# Patient Record
Sex: Male | Born: 1960
Health system: Southern US, Community
[De-identification: ages and names within clinical notes are randomized; demographics above are authoritative.]

## PROBLEM LIST (undated history)

## (undated) DIAGNOSIS — G5731 Lesion of lateral popliteal nerve, right lower limb: Secondary | ICD-10-CM

## (undated) DIAGNOSIS — R351 Nocturia: Secondary | ICD-10-CM

## (undated) DIAGNOSIS — I493 Ventricular premature depolarization: Secondary | ICD-10-CM

## (undated) DIAGNOSIS — K219 Gastro-esophageal reflux disease without esophagitis: Secondary | ICD-10-CM

## (undated) DIAGNOSIS — G43909 Migraine, unspecified, not intractable, without status migrainosus: Secondary | ICD-10-CM

## (undated) DIAGNOSIS — M1990 Unspecified osteoarthritis, unspecified site: Secondary | ICD-10-CM

## (undated) DIAGNOSIS — M109 Gout, unspecified: Secondary | ICD-10-CM

## (undated) DIAGNOSIS — I712 Thoracic aortic aneurysm, without rupture: Secondary | ICD-10-CM

## (undated) DIAGNOSIS — M21371 Foot drop, right foot: Secondary | ICD-10-CM

## (undated) DIAGNOSIS — I10 Essential (primary) hypertension: Secondary | ICD-10-CM

## (undated) DIAGNOSIS — J309 Allergic rhinitis, unspecified: Secondary | ICD-10-CM

## (undated) DIAGNOSIS — I350 Nonrheumatic aortic (valve) stenosis: Secondary | ICD-10-CM

## (undated) DIAGNOSIS — L309 Dermatitis, unspecified: Secondary | ICD-10-CM

## (undated) DIAGNOSIS — G4762 Sleep related leg cramps: Secondary | ICD-10-CM

## (undated) DIAGNOSIS — I7121 Aneurysm of the ascending aorta, without rupture: Secondary | ICD-10-CM

## (undated) DIAGNOSIS — B001 Herpesviral vesicular dermatitis: Secondary | ICD-10-CM

## (undated) DIAGNOSIS — G610 Guillain-Barre syndrome: Secondary | ICD-10-CM

## (undated) DIAGNOSIS — Z9889 Other specified postprocedural states: Secondary | ICD-10-CM

## (undated) DIAGNOSIS — G2581 Restless legs syndrome: Secondary | ICD-10-CM

## (undated) DIAGNOSIS — R112 Nausea with vomiting, unspecified: Secondary | ICD-10-CM

## (undated) HISTORY — DX: Essential (primary) hypertension: I10

## (undated) HISTORY — DX: Gastro-esophageal reflux disease without esophagitis: K21.9

## (undated) HISTORY — DX: Lesion of lateral popliteal nerve, right lower limb: G57.31

## (undated) HISTORY — PX: REPLACEMENT UNICONDYLAR JOINT KNEE: SUR1227

## (undated) HISTORY — DX: Guillain-Barre syndrome: G61.0

## (undated) HISTORY — DX: Restless legs syndrome: G25.81

## (undated) HISTORY — DX: Migraine, unspecified, not intractable, without status migrainosus: G43.909

## (undated) HISTORY — PX: TONSILLECTOMY: SUR1361

## (undated) HISTORY — PX: CARDIAC CATHETERIZATION: SHX172

## (undated) HISTORY — DX: Sleep related leg cramps: G47.62

## (undated) HISTORY — DX: Foot drop, right foot: M21.371

## (undated) HISTORY — DX: Dermatitis, unspecified: L30.9

## (undated) HISTORY — DX: Allergic rhinitis, unspecified: J30.9

## (undated) HISTORY — PX: COLONOSCOPY: SHX174

## (undated) HISTORY — DX: Ventricular premature depolarization: I49.3

## (undated) HISTORY — DX: Herpesviral vesicular dermatitis: B00.1

## (undated) HISTORY — PX: BREAST LUMPECTOMY: SHX2

---
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
## 2000-04-10 ENCOUNTER — Encounter: Payer: Self-pay | Admitting: *Deleted

## 2000-04-10 ENCOUNTER — Encounter: Admission: RE | Admit: 2000-04-10 | Discharge: 2000-04-10 | Payer: Self-pay | Admitting: *Deleted

## 2000-09-18 ENCOUNTER — Ambulatory Visit (HOSPITAL_COMMUNITY): Admission: RE | Admit: 2000-09-18 | Discharge: 2000-09-18 | Payer: Self-pay | Admitting: Gastroenterology

## 2003-03-21 ENCOUNTER — Encounter: Payer: Self-pay | Admitting: Internal Medicine

## 2003-03-21 ENCOUNTER — Encounter: Admission: RE | Admit: 2003-03-21 | Discharge: 2003-03-21 | Payer: Self-pay | Admitting: Internal Medicine

## 2003-06-09 ENCOUNTER — Emergency Department (HOSPITAL_COMMUNITY): Admission: EM | Admit: 2003-06-09 | Discharge: 2003-06-10 | Payer: Self-pay | Admitting: Emergency Medicine

## 2005-08-31 ENCOUNTER — Ambulatory Visit (HOSPITAL_COMMUNITY): Admission: RE | Admit: 2005-08-31 | Discharge: 2005-08-31 | Payer: Self-pay | Admitting: *Deleted

## 2009-04-27 ENCOUNTER — Encounter: Admission: RE | Admit: 2009-04-27 | Discharge: 2009-04-27 | Payer: Self-pay | Admitting: Internal Medicine

## 2009-05-01 ENCOUNTER — Encounter: Admission: RE | Admit: 2009-05-01 | Discharge: 2009-05-01 | Payer: Self-pay | Admitting: Internal Medicine

## 2009-06-23 ENCOUNTER — Encounter: Admission: RE | Admit: 2009-06-23 | Discharge: 2009-06-23 | Payer: Self-pay | Admitting: Rheumatology

## 2010-11-05 NOTE — Procedures (Signed)
. [facility]  Patient:    Marvin Munoz, Marvin Munoz                          MRN: [id number] Proc. Date: 09/18/00 Attending:  Verlin Grills, M.D. CC:         Yvette Rack. Dorna Bloom, M.D.   Procedure Report  DATE OF BIRTH:  05/11/1961  PROCEDURE PERFORMED:  Esophagogastroduodenoscopy and colonoscopy.  ENDOSCOPIST:  Verlin Grills, M.D.  INDICATIONS FOR PROCEDURE:  The patient is a 50 year old male with chronic gastroesophageal reflux disease for which he chronically takes Prilosec.  He also intermittently passes fresh blood with otherwise normal bowel movements.  I discussed with [name] the complications associated with esophagogastroduodenoscopy and colonoscopy including intestinal bleeding and intestinal perforation.  [name] has signed the operative permit.  PREMEDICATION: Versed 10 mg, fentanyl 100 mcg.  PROCEDURE:  Esophagogastroduodenoscopy.  DESCRIPTION OF PROCEDURE:  After obtaining informed consent, the patient was placed in the left lateral decubitus position.  I administered intravenous fentanyl and intravenous Versed to achieve conscious sedation for the procedure.  The patients blood pressure, oxygen saturation and cardiac rhythm were monitored throughout the procedure and documented in the medical record.  The Olympus video gastroscope was passed through the posterior hypopharynx into the proximal esophagus without difficulty.  The hypopharynx, larynx and vocal cords appeared normal.  Esophagoscopy:  The proximal, mid and lower segments of the esophagus appeared normal.  Endoscopically, there was no evidence for the presence of Barretts esophagus, esophageal mucosal scarring, erosive esophagitis or esophageal ulceration.  Gastroscopy:  Endoscopically, there was no evidence for the presence of a hiatal hernia.  Retroflex view of the gastric cardia and fundus was normal. The gastric body, antrum and pylorus appeared  normal.  Duodenoscopy:  The duodenal bulb and descending duodenum appeared normal.  ASSESSMENT:  Normal esophagogastroduodenoscopy.  PROCEDURE:  Proctocolonoscopy to the cecum.  DESCRIPTION OF PROCEDURE:  Anal inspection was normal.  Digital rectal exam revealed a non-nodular prostate.  The Olympus pediatric video colonoscope was then introduced into the rectum and under direct vision, advanced to the cecum as identified by a normal-appearing ileocecal valve and appendiceal orifice. Colonic preparation for the exam today was excellent.  Rectum:  Normal.  Sigmoid colon and descending colon:  Normal.  Splenic flexure:  Normal.  Transverse colon:  Normal.  Hepatic flexure:  Normal.  Ascending colon:  Normal.  Cecum and ileocecal valve:  Normal.  ASSESSMENT:  Normal proctocolonoscopy to the cecum.  RECOMMENDATIONS:  Repeat colonoscopy in approximately 10 years. DD:  09/18/00 TD:  09/18/00 Job: [id number]

## 2010-11-05 NOTE — Cardiovascular Report (Signed)
NAME:  [name], [name] NO.:  [id number]   MEDICAL RECORD NO.:  [id number]          PATIENT TYPE:  OIB   LOCATION:  NA                           FACILITY:  [facility]   PHYSICIAN:  Meade Maw, M.D.    DATE OF BIRTH:  08/31/1960   DATE OF PROCEDURE:  DATE OF DISCHARGE:                              CARDIAC CATHETERIZATION   Audio too short to transcribe (less than 5 seconds)      Meade Maw, M.D.     HP/MEDQ  D:  08/31/2005  T:  08/31/2005  Job:  [id number]

## 2010-11-05 NOTE — Cardiovascular Report (Signed)
NAME:  Marvin Munoz, Marvin Munoz NO.:  [id number]   MEDICAL RECORD NO.:  [id number]          PATIENT TYPE:  OIB   LOCATION:  NA                           FACILITY:  [facility]   PHYSICIAN:  Meade Maw, M.D.    DATE OF BIRTH:  1960/11/29   DATE OF PROCEDURE:  08/31/2005  DATE OF DISCHARGE:                              CARDIAC CATHETERIZATION   INDICATION FOR PROCEDURE:  Ongoing chest pain, equivocal ischemia on  Cardiolite.   PROCEDURE:  After obtaining written informed consent, the patient was  brought to the cardiac catheterization lab in a post absorptive state.  Preoperative sedation was achieved using IV Versed. The right groin was  prepped and draped in the usual sterile fashion. The patient was given  Versed 2 mg IV. A 6-French hemostasis sheath was placed into the right  femoral artery using a modified Seldinger technique. Selective coronary  angiography was performed using a JL-4 and a non-torqued right single plane  ventriculogram was performed in the RAO position, using a 6-French straight  pigtail curved catheter. All catheter exchanges were made over a guidewire.  The hemostasis sheath was flushed following each catheter exchange. There  was no identifiable coronary artery disease. There was no early  complications. The patient was transferred to the holding area. The  hemostasis sheath was removed. Hemostasis was achieved using a FemStop  device.   FINDINGS:  1.  Aortic pressure 121/72.  2.  LV pressure 121/5 with an EDP of 11.   SINGLE-PLANE VENTRICULOGRAM:  Revealed normal wall motion and ejection  fraction of 60-65% noted. There was no mitral regurgitation noted.   CORONARY ANGIOGRAPHY:  1.  The left main coronary artery bifurcates into the left anterior      descending and circumflex vessel.  There was no identifiable disease in      the left main coronary artery.  2.  The left anterior descending gives rise to a small trivial D1, and a      larger D2.   there was no disease noted in the left anterior descending      or its branches.  3.  The circumflex vessel gives rise to trivial OM1, a trivial OM-2 and has      a posterolateral vessel. The circumflex vessel was a large vessel. There      was no disease noted in the circumflex or its branches.  4.  The right coronary artery is a large artery, dominant for the posterior      circulation.  It gives rise to trivial RV marginals, a small to moderate      PDA, and a small PL branch.  There was no disease noted in the right      coronary artery or its branches.   FINAL IMPRESSION:  1.  Normal single-plane ventriculogram.  2.  Normal coronary angiography  3.  False-positive Cardiolite.   RECOMMENDATIONS:  Other etiologies for his chest pain should be considered.      Meade Maw, M.D.  Electronically Signed     HP/MEDQ  D:  08/31/2005  T:  09/01/2005  Job:  [id number]

## 2013-11-05 ENCOUNTER — Other Ambulatory Visit: Payer: Self-pay | Admitting: Interventional Cardiology

## 2013-11-05 DIAGNOSIS — I359 Nonrheumatic aortic valve disorder, unspecified: Secondary | ICD-10-CM

## 2013-11-06 ENCOUNTER — Ambulatory Visit: Payer: Self-pay | Admitting: Interventional Cardiology

## 2013-11-06 ENCOUNTER — Other Ambulatory Visit: Payer: Self-pay

## 2013-11-06 ENCOUNTER — Ambulatory Visit (HOSPITAL_COMMUNITY): Payer: 59 | Attending: Interventional Cardiology | Admitting: Cardiology

## 2013-11-06 ENCOUNTER — Other Ambulatory Visit (HOSPITAL_COMMUNITY): Payer: Self-pay

## 2013-11-06 DIAGNOSIS — I359 Nonrheumatic aortic valve disorder, unspecified: Secondary | ICD-10-CM

## 2013-11-06 NOTE — Progress Notes (Signed)
Echo performed. 

## 2013-11-12 ENCOUNTER — Telehealth: Payer: Self-pay

## 2013-11-12 NOTE — Telephone Encounter (Signed)
pt given echo results.Mild to moderate aortic valve disease with both regurgitation and stenosis. He needs clinical followup if not already scheduled.pt to keep f/u with Dr.Smith for 6/1 pt verbalized understanding.

## 2013-11-12 NOTE — Telephone Encounter (Signed)
called to give pt echo results.lmom for pt to call back 

## 2013-11-12 NOTE — Telephone Encounter (Signed)
°  Patient is returning your call. Please call and advise.  °

## 2013-11-12 NOTE — Telephone Encounter (Signed)
Message copied by Lamar Laundry on Tue Nov 12, 2013  2:05 PM ------      Message from: Daneen Schick      Created: Tue Nov 12, 2013  8:51 AM       Mild to moderate aortic valve disease with both regurgitation and stenosis. He needs clinical followup if not already scheduled ------

## 2013-11-15 ENCOUNTER — Ambulatory Visit: Payer: Self-pay | Admitting: Interventional Cardiology

## 2013-11-18 ENCOUNTER — Encounter (INDEPENDENT_AMBULATORY_CARE_PROVIDER_SITE_OTHER): Payer: Self-pay

## 2013-11-18 ENCOUNTER — Encounter: Payer: Self-pay | Admitting: Interventional Cardiology

## 2013-11-18 ENCOUNTER — Ambulatory Visit (INDEPENDENT_AMBULATORY_CARE_PROVIDER_SITE_OTHER): Payer: 59 | Admitting: Interventional Cardiology

## 2013-11-18 VITALS — BP 122/70 | HR 65 | Ht 75.0 in | Wt 220.0 lb

## 2013-11-18 DIAGNOSIS — I10 Essential (primary) hypertension: Secondary | ICD-10-CM

## 2013-11-18 DIAGNOSIS — I7789 Other specified disorders of arteries and arterioles: Secondary | ICD-10-CM

## 2013-11-18 DIAGNOSIS — I359 Nonrheumatic aortic valve disorder, unspecified: Secondary | ICD-10-CM

## 2013-11-18 NOTE — Patient Instructions (Signed)
Your physician recommends that you continue on your current medications as directed. Please refer to the Current Medication list given to you today.  Your physician wants you to follow-up in: 1 year. You will receive a reminder letter in the mail two months in advance. If you don't receive a letter, please call our office to schedule the follow-up appointment.  

## 2013-11-18 NOTE — Progress Notes (Signed)
Patient ID: Marvin Munoz, male   DOB: 1960/08/02, 53 y.o.   MRN: [id number]    [address], [place]  [address] Phone: [phone] Fax:  [phone]  Date:  11/18/2013   ID:  Marvin Munoz, DOB 03-03-1961, MRN [id number]  PCP:  No primary provider on file.   ASSESSMENT:  1. Bicuspid aortic valve, clinically mild stenosis and regurgitation. Confirmed by echocardiogram May 2015 2. Mildly dilated aortic root, 4.2 cm 3. Hypertension  PLAN:  1. Clinical followup in one year 2. Monitor blood pressure 3. Active lifestyle 4. Reassured concerning the stability of the current cardiac status   Prolonged office visit. The patient is accompanied by his wife. Multiple questions were answered and prolonged conversation encountered.   SUBJECTIVE: Marvin Munoz is a 53 y.o. male fatigue but otherwise no complaints. Denies orthopnea, PND, palpitations, syncope, chest pain. He has not had chills or fever. He takes his medication on a regular basis. He denies palpitations. He has not had syncope. Is no peripheral edema. He has not been purposefully exercising.   Wt Readings from Last 3 Encounters:  11/18/13 220 lb (99.791 kg)     No past medical history on file.  Current Outpatient Prescriptions  Medication Sig Dispense Refill  . allopurinol (ZYLOPRIM) 300 MG tablet       . diltiazem (CARDIZEM CD) 240 MG 24 hr capsule       . omeprazole (PRILOSEC) 40 MG capsule       . valACYclovir (VALTREX) 1000 MG tablet        No current facility-administered medications for this visit.    Allergies:    Allergies  Allergen Reactions  . Colchicine Diarrhea    Social History:  The patient  reports that he has never smoked. He does not have any smokeless tobacco history on file.   ROS:  Please see the history of present illness. Has to occasionally take a deep breath because he feels air hunger. The sensation lasts seconds before resolving. No palpitations or syncope.      All  other systems reviewed and negative.   OBJECTIVE: VS:  BP 122/70  Pulse 65  Ht 6\' 3"  (1.905 m)  Wt 220 lb (99.791 kg)  BMI 27.50 kg/m2 Well nourished, well developed, in no acute distress, appears healthy HEENT: normal Neck: JVD flat. Carotid bruit absent  Cardiac:  normal S1, S2; RRR; no murmur. 1-2/6 upper sternal border systolic murmur. Scratchy 1/6 decrescendo murmur of aortic regurgitation. No gallop Lungs:  clear to auscultation bilaterally, no wheezing, rhonchi or rales Abd: soft, nontender, no hepatomegaly Ext: Edema absent. Pulses 2+ Skin: warm and dry Neuro:  CNs 2-12 intact, no focal abnormalities noted  EKG:  Normal EKG. Normal sinus rhythm to       Signed, Illene Labrador III, MD 11/18/2013 10:57 AM

## 2013-11-19 ENCOUNTER — Encounter: Payer: Self-pay | Admitting: Interventional Cardiology

## 2013-11-19 DIAGNOSIS — M109 Gout, unspecified: Secondary | ICD-10-CM | POA: Insufficient documentation

## 2013-11-19 DIAGNOSIS — K219 Gastro-esophageal reflux disease without esophagitis: Secondary | ICD-10-CM | POA: Insufficient documentation

## 2013-11-19 DIAGNOSIS — I10 Essential (primary) hypertension: Secondary | ICD-10-CM | POA: Insufficient documentation

## 2013-11-19 DIAGNOSIS — I359 Nonrheumatic aortic valve disorder, unspecified: Secondary | ICD-10-CM | POA: Insufficient documentation

## 2013-11-19 DIAGNOSIS — I7789 Other specified disorders of arteries and arterioles: Secondary | ICD-10-CM | POA: Insufficient documentation

## 2013-12-10 ENCOUNTER — Encounter: Payer: Self-pay | Admitting: *Deleted

## 2014-08-26 ENCOUNTER — Ambulatory Visit
Admission: RE | Admit: 2014-08-26 | Discharge: 2014-08-26 | Disposition: A | Discharge status: Home / Self Care | Payer: 59 | Source: Ambulatory Visit | Attending: Internal Medicine | Admitting: Internal Medicine

## 2014-08-26 ENCOUNTER — Other Ambulatory Visit: Payer: Self-pay | Admitting: Internal Medicine

## 2014-08-26 ENCOUNTER — Encounter (INDEPENDENT_AMBULATORY_CARE_PROVIDER_SITE_OTHER): Payer: Self-pay

## 2014-08-26 DIAGNOSIS — R9389 Abnormal findings on diagnostic imaging of other specified body structures: Secondary | ICD-10-CM

## 2014-08-26 DIAGNOSIS — R05 Cough: Secondary | ICD-10-CM

## 2014-08-26 DIAGNOSIS — J40 Bronchitis, not specified as acute or chronic: Secondary | ICD-10-CM

## 2014-08-26 DIAGNOSIS — R059 Cough, unspecified: Secondary | ICD-10-CM

## 2014-09-01 ENCOUNTER — Ambulatory Visit
Admission: RE | Admit: 2014-09-01 | Discharge: 2014-09-01 | Disposition: A | Discharge status: Home / Self Care | Payer: 59 | Source: Ambulatory Visit | Attending: Internal Medicine | Admitting: Internal Medicine

## 2014-09-01 DIAGNOSIS — R059 Cough, unspecified: Secondary | ICD-10-CM

## 2014-09-01 DIAGNOSIS — R05 Cough: Secondary | ICD-10-CM

## 2014-09-01 DIAGNOSIS — R9389 Abnormal findings on diagnostic imaging of other specified body structures: Secondary | ICD-10-CM

## 2014-09-10 ENCOUNTER — Encounter: Payer: Self-pay | Admitting: Internal Medicine

## 2014-09-10 ENCOUNTER — Ambulatory Visit (INDEPENDENT_AMBULATORY_CARE_PROVIDER_SITE_OTHER): Payer: 59 | Admitting: Internal Medicine

## 2014-09-10 VITALS — BP 120/84 | HR 82 | Ht 75.0 in | Wt 217.3 lb

## 2014-09-10 DIAGNOSIS — R058 Other specified cough: Secondary | ICD-10-CM | POA: Insufficient documentation

## 2014-09-10 DIAGNOSIS — R05 Cough: Secondary | ICD-10-CM

## 2014-09-10 DIAGNOSIS — R059 Cough, unspecified: Secondary | ICD-10-CM

## 2014-09-10 DIAGNOSIS — J45991 Cough variant asthma: Secondary | ICD-10-CM | POA: Diagnosis not present

## 2014-09-10 MED ORDER — MOMETASONE FURO-FORMOTEROL FUM 100-5 MCG/ACT IN AERO
INHALATION_SPRAY | RESPIRATORY_TRACT | Status: DC
Start: 2014-09-10 — End: 2014-09-26

## 2014-09-10 NOTE — Progress Notes (Signed)
Subjective:    Patient ID: Marvin Munoz, male    DOB: [birth year], MRN: [id number]  HPI   45 yowm never smoker ? Bronchitis as child but fine in HS through adulthood until March 2015 with bad cough p uri better p levaquin/pred/albuterol illness lasted 4 weeks  referred 09/10/2014 by Dr Mertha Finders same symptoms x 6 weeks since around Jul 21 2014.   09/10/2014 1st [facility] office visit/ Marvin Munoz   Chief Complaint  Patient presents with  . Pulmonary Consult    Referred by Dr. Mertha Finders. Pt c/o cough and DOE for the past 5-6 wks. He states gets SOB with walking at his work and walking up stairs. Has not felt like golfing lately due to symptoms.  Cough is mainly non prod, but will occ produce minimal clear sputum.  Cough seems worse in the evenings.   cough worse after supper and worse at hs unless uses inhaler plus tussionex Takes prilosec with bfast  Cough worse with voice use Already on symbicort 160 2bid not helping cough? Helping breathing  No obvious other patterns in day to day or daytime variabilty or assoc  cp or chest tightness, subjective wheeze overt sinus or hb symptoms. No unusual exp hx or h/o childhood pna/ asthma or knowledge of premature birth.  Sleeping ok without nocturnal  or early am exacerbation  of respiratory  c/o's or need for noct saba. Also denies any obvious fluctuation of symptoms with weather or environmental changes or other aggravating or alleviating factors except as outlined above   Current Medications, Allergies, Complete Past Medical History, Past Surgical History, Family History, and Social History were reviewed in [employer] record.                      Review of Systems  Constitutional: Negative for fever, chills, activity change, appetite change and unexpected weight change.  HENT: Negative for congestion, dental problem, postnasal drip, rhinorrhea, sneezing, sore throat, trouble swallowing and voice change.    Eyes: Negative for visual disturbance.  Respiratory: Positive for cough and shortness of breath. Negative for choking.   Cardiovascular: Negative for chest pain and leg swelling.  Gastrointestinal: Negative for nausea, vomiting and abdominal pain.  Genitourinary: Negative for difficulty urinating.  Musculoskeletal: Negative for arthralgias.  Skin: Negative for rash.  Psychiatric/Behavioral: Negative for behavioral problems and confusion.       Objective:   Physical Exam  amb slt hoarse wm nad  Wt Readings from Last 3 Encounters:  09/10/14 217 lb 4.8 oz (98.567 kg)  11/18/13 220 lb (99.791 kg)    Vital signs reviewed   HEENT: nl dentition, turbinates, and orophanx. Nl external ear canals without cough reflex   NECK :  without JVD/Nodes/TM/ nl carotid upstrokes bilaterally   LUNGS: no acc muscle use, clear to A and P bilaterally without cough on insp or exp maneuvers   CV:  RRR  no s3 or murmur or increase in P2, no edema   ABD:  soft and nontender with nl excursion in the supine position. No bruits or organomegaly, bowel sounds nl  MS:  warm without deformities, calf tenderness, cyanosis or clubbing  SKIN: warm and dry without lesions    NEURO:  alert, approp, no deficits    CT chest  09/01/14 Age-indeterminate focal bronchial wall thickening and subsegmental atelectasis within the left lower lobe - nonspecific though could be seen in the setting of geographic airways disease. Further evaluation could  be performed with bronchoscopy as clinically indicated. Otherwise, no explanation for patient's persistent cough.      Assessment & Plan:

## 2014-09-10 NOTE — Patient Instructions (Addendum)
prilosec Take 30- 60 min before your first and last meals of the day and pepcid ac 20 mg at bedtime  mucinex dm  1200 mg every 12 hours as needed  For drainage take chlortrimeton (chlorpheniramine) 4 mg every 4 hours available over the counter (may cause drowsiness)   GERD (REFLUX)  is an extremely common cause of respiratory symptoms just like yours , many times with no obvious heartburn at all.    It can be treated with medication, but also with lifestyle changes including avoidance of late meals, excessive alcohol, smoking cessation, and avoid fatty foods, chocolate, peppermint, colas, red wine, and acidic juices such as orange juice.  NO MINT OR MENTHOL PRODUCTS SO NO COUGH DROPS  USE SUGARLESS CANDY INSTEAD (Jolley ranchers or Stover's or Life Savers) or even ice chips will also do - the key is to swallow to prevent all throat clearing. NO OIL BASED VITAMINS - use powdered substitutes.  dulera 100 Take 2 puffs first thing in am and then another 2 puffs about 12 hours later - call to schedule ct if losing ground   If still coughing > take tussionex x 3 days straight while not coughing   Please schedule a follow up office visit in 2 weeks, sooner if needed

## 2014-09-11 ENCOUNTER — Encounter: Payer: Self-pay | Admitting: Internal Medicine

## 2014-09-11 NOTE — Assessment & Plan Note (Addendum)
The most common causes of chronic cough in immunocompetent adults include the following: upper airway cough syndrome (UACS), previously referred to as postnasal drip syndrome (PNDS), which is caused by variety of rhinosinus conditions; (2) asthma; (3) GERD; (4) chronic bronchitis from cigarette smoking or other inhaled environmental irritants; (5) nonasthmatic eosinophilic bronchitis; and (6) bronchiectasis.   These conditions, singly or in combination, have accounted for up to 94% of the causes of chronic cough in prospective studies.   Other conditions have constituted no >6% of the causes in prospective studies These have included bronchogenic carcinoma, chronic interstitial pneumonia, sarcoidosis, left ventricular failure, ACEI-induced cough, and aspiration from a condition associated with pharyngeal dysfunction.    Chronic cough is often simultaneously caused by more than one condition. A single cause has been found from 38 to 82% of the time, multiple causes from 18 to 62%. Multiply caused cough has been the result of three diseases up to 42% of the time.       Overall pattern suggests combination of cough variant asthma and UACS =    Upper airway cough syndrome, so named because it's frequently impossible to sort out how much is  CR/sinusitis with freq throat clearing (which can be related to primary GERD)   vs  causing  secondary (" extra esophageal")  GERD from wide swings in gastric pressure that occur with throat clearing, often  promoting self use of mint and menthol lozenges that reduce the lower esophageal sphincter tone and exacerbate the problem further in a cyclical fashion.   These are the same pts (now being labeled as having "irritable larynx syndrome" by some cough centers) who not infrequently have a history of having failed to tolerate ace inhibitors,  dry powder inhalers or biphosphonates or report having atypical reflux symptoms that don't respond to standard doses of PPI , and  are easily confused as having aecopd or asthma flares by even experienced allergists/ pulmonologists.   Sorting between these two is problematic but for now rec 1) decrease ICS > dulera 100 2bid 2) max gerd rx  3) eliminate cylical cough   F/u in 2 weeks    Each maintenance medication was reviewed in detail including most importantly the difference between maintenance and as needed and under what circumstances the prns are to be used.  Please see instructions for details which were reviewed in writing and the patient given a copy.

## 2014-09-23 ENCOUNTER — Institutional Professional Consult (permissible substitution): Payer: 59 | Admitting: Internal Medicine

## 2014-09-24 ENCOUNTER — Encounter: Payer: Self-pay | Admitting: Neurology

## 2014-09-24 ENCOUNTER — Ambulatory Visit (INDEPENDENT_AMBULATORY_CARE_PROVIDER_SITE_OTHER): Payer: 59 | Admitting: Neurology

## 2014-09-24 ENCOUNTER — Ambulatory Visit (INDEPENDENT_AMBULATORY_CARE_PROVIDER_SITE_OTHER): Payer: Self-pay | Admitting: Neurology

## 2014-09-24 DIAGNOSIS — G5731 Lesion of lateral popliteal nerve, right lower limb: Secondary | ICD-10-CM | POA: Diagnosis not present

## 2014-09-24 DIAGNOSIS — R29898 Other symptoms and signs involving the musculoskeletal system: Secondary | ICD-10-CM

## 2014-09-24 DIAGNOSIS — M2142 Flat foot [pes planus] (acquired), left foot: Secondary | ICD-10-CM

## 2014-09-24 DIAGNOSIS — G588 Other specified mononeuropathies: Principal | ICD-10-CM

## 2014-09-24 DIAGNOSIS — M21371 Foot drop, right foot: Secondary | ICD-10-CM | POA: Insufficient documentation

## 2014-09-24 HISTORY — DX: Lesion of lateral popliteal nerve, right lower limb: G57.31

## 2014-09-24 HISTORY — DX: Foot drop, right foot: M21.371

## 2014-09-24 NOTE — Procedures (Signed)
     HISTORY:   Marvin Munoz is a 54 year old gentleman with a history of Guillain-Barr syndrome at age 62. The patient seemed to fully recover from this, but within the last 4 weeks, he has noted a footdrop that occurred on the right foot. The patient denies that he crosses his legs , no history of trauma or injury to the leg. The patient does have some back pain, without radiation down the leg. The patient being evaluated for possible neuropathy or a lumbosacral radiculopathy.  NERVE CONDUCTION STUDIES:  Nerve conduction studies were performed on both lower extremities. The distal motor latencies and motor amplitudes for the peroneal and posterior tibial nerves were within normal limits, but there is a drop-off in motor amplitudes for the right peroneal nerve across the knee.. The nerve conduction velocities for these nerves were also normal. The H reflex latencies were slightly prolonged bilaterally. The sensory latencies for the peroneal nerves were within normal limits. The F wave latencies for the peroneal and posterior tibial nerves were prolonged bilaterally, the patient is 6 feet 3 inches tall.   EMG STUDIES:  EMG study was performed on the right lower extremity:  The tibialis anterior muscle reveals 2 to 4K motor units with decreased recruitment. 3+ fibrillations and positive waves were seen. The peroneus tertius muscle reveals 2 to 4K motor units with decreased recruitment. 3+ fibrillations and positive waves were seen. The medial gastrocnemius muscle reveals 1 to 3K motor units with full recruitment. No fibrillations or positive waves were seen. The vastus lateralis muscle reveals 2 to 4K motor units with full recruitment. No fibrillations or positive waves were seen. The iliopsoas muscle reveals 2 to 4K motor units with full recruitment. No fibrillations or positive waves were seen. The biceps femoris muscle (long head) reveals 2 to 4K motor units with full recruitment. No  fibrillations or positive waves were seen. The lumbosacral paraspinal muscles were tested at 3 levels, and revealed no abnormalities of insertional activity at all 3 levels tested. There was good relaxation.   IMPRESSION:  Nerve conduction studies done on both lower extremities shows evidence of a drop-off in motor amplitudes across the knee for the right peroneal nerve consistent with a peroneal neuropathy. 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 There is no evidence of an overlying lumbosacral radiculopathy. The reflex latencies and F wave latencies were slightly prolonged bilaterally, this may be a manifestation of the patient's height, not related to an abnormality in nerve conduction.  Jill Alexanders MD 09/24/2014 11:11 AM  [facility] [address] [place], [place] [address]  Phone [phone] Fax [phone]

## 2014-09-24 NOTE — Progress Notes (Signed)
Please refer to EMG and nerve conduction study procedure note. 

## 2014-09-26 ENCOUNTER — Encounter: Payer: Self-pay | Admitting: Neurology

## 2014-09-26 ENCOUNTER — Ambulatory Visit (INDEPENDENT_AMBULATORY_CARE_PROVIDER_SITE_OTHER): Payer: 59 | Admitting: Neurology

## 2014-09-26 VITALS — BP 124/79 | HR 100 | Ht 75.0 in | Wt 212.0 lb

## 2014-09-26 DIAGNOSIS — M21371 Foot drop, right foot: Secondary | ICD-10-CM

## 2014-09-26 DIAGNOSIS — G5731 Lesion of lateral popliteal nerve, right lower limb: Secondary | ICD-10-CM | POA: Diagnosis not present

## 2014-09-26 DIAGNOSIS — G4762 Sleep related leg cramps: Secondary | ICD-10-CM

## 2014-09-26 HISTORY — DX: Sleep related leg cramps: G47.62

## 2014-09-26 NOTE — Patient Instructions (Addendum)
    For the leg cramps, may try magnesium oxide tablets, 250 mg at night. May go up slowly on the dose if needed. Look out for diarrhea.   Leg Cramps Leg cramps that occur during exercise can be caused by poor circulation or dehydration. However, muscle cramps that occur at rest or during the night are usually not due to any serious medical problem. Heat cramps may cause muscle spasms during hot weather.  CAUSES There is no clear cause for muscle cramps. However, dehydration may be a factor for those who do not drink enough fluids and those who exercise in the heat. Imbalances in the level of sodium, potassium, calcium or magnesium in the muscle tissue may also be a factor. Some medications, such as water pills (diuretics), may cause loss of chemicals that the body needs (like sodium and potassium) and cause muscle cramps. TREATMENT   Make sure your diet has enough fluids and essential minerals for the muscle to work normally.  Avoid strenuous exercise for several days if you have been having frequent leg cramps.  Stretch and massage the cramped muscle for several minutes.  Some medicines may be helpful in some patients with night cramps. Only take over-the-counter or prescription medicines as directed by your caregiver. SEEK IMMEDIATE MEDICAL CARE IF:   Your leg cramps become worse.  Your foot becomes cold, numb, or blue. Document Released: 07/14/2004 Document Revised: 08/29/2011 Document Reviewed: 07/01/2008 [facility] Patient Information 2015 [place], [place]. This information is not intended to replace advice given to you by your health care provider. Make sure you discuss any questions you have with your health care provider.

## 2014-09-26 NOTE — Progress Notes (Signed)
Reason for visit:  Right foot drop  Referring physician:  Dr. Elease Etienne is a 54 y.o. male  History of present illness:   Marvin Munoz is a 54 year old right-handed white male with a  history of Guillain-Barr syndrome at age 51. The patient had a full recovery from this , but within the last 4 weeks, he developed a foot drop spontaneously involving the right foot. The patient has some achy sensations in the pretibial area, but no significant leg pain or back pain otherwise. He has some numbness into the foot as well. He denies any issues with the left leg or with the arms. He denies any problems controlling the bowels or the bladder. He indicates that he does not cross his legs with sitting. He has not been performing any activities that put pressure on the side of the right knee. He has had EMG and nerve conduction study evaluation that confirmed the presence of a peroneal neuropathy at the fibular head , without evidence of an overlying lumbosacral radiculopathy. He comes back today for reevaluation. The patient has a long-standing history of nocturnal leg cramps involving both legs. This is particularly noticeable after days of increased physical activity. The patient also reports a history of migraine headache.  Past Medical History  Diagnosis Date  . Aortic insufficiency due to bicuspid aortic valve   . HTN (hypertension)   . Gastroesophageal reflux   . PVC's (premature ventricular contractions)     hx of benign  . Fever blister   . Migraine headache   . Bursitis of elbow     gouty olecranon, right elbow  . Guillain-Barre syndrome   . Allergic rhinitis   . BPH (benign prostatic hyperplasia)   . Eczema     forearm  . Restless legs syndrome (RLS)   . Foot drop, right 09/24/2014  . 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 Guillain Barr syndrome 1970's  . Guillain Barr syndrome   . Nocturnal leg cramps 09/26/2014    Past Surgical History  Procedure  Laterality Date  . Breast lumpectomy Right   . Tonsillectomy    . Cardiac catheterization    . Colonoscopy      Family History  Problem Relation Age of Onset  . Heart attack Father   . Prostate cancer Father   . COPD Mother     smoked  . Asthma Mother   . Leukemia Maternal Grandfather   . Prostate cancer Maternal Grandfather   . Prostate cancer Maternal Uncle     Social history:  reports that he has never smoked. He has never used smokeless tobacco. He reports that he drinks alcohol. He reports that he does not use illicit drugs.  Medications:  Prior to Admission medications   Medication Sig Start Date End Date Taking? Authorizing Provider  allopurinol (ZYLOPRIM) 300 MG tablet Take 300 mg by mouth daily.  10/25/13  Yes Historical Provider, MD  aspirin 81 MG tablet Take 81 mg by mouth daily.   Yes Historical Provider, MD  Calcium Carb-Cholecalciferol (CALCIUM + D3) 600-200 MG-UNIT TABS Take by mouth 2 (two) times daily.   Yes Historical Provider, MD  diltiazem (CARDIZEM CD) 240 MG 24 hr capsule Take 240 mg by mouth daily.  11/01/13  Yes Historical Provider, MD  fexofenadine (ALLEGRA) 180 MG tablet Take 180 mg by mouth daily.   Yes Historical Provider, MD  Multiple Vitamin (MULTIVITAMIN) tablet Take 1 tablet by mouth daily.  Yes Historical Provider, MD  omeprazole (PRILOSEC) 40 MG capsule Take 40 mg by mouth daily.  10/25/13  Yes Historical Provider, MD  psyllium (METAMUCIL) 58.6 % packet Take 1 packet by mouth daily.   Yes Historical Provider, MD  rizatriptan (MAXALT-MLT) 10 MG disintegrating tablet Take 10 mg by mouth as needed for migraine. May repeat in 2 hours if needed   Yes Historical Provider, MD  valACYclovir (VALTREX) 1000 MG tablet Take by mouth daily.  10/25/13  Yes Historical Provider, MD      Allergies  Allergen Reactions  . Colchicine Diarrhea  . Influenza Vaccines     Guillain-barre    ROS:  Out of a complete 14 system review of symptoms, the patient complains  only of the following symptoms, and all other reviewed systems are negative.   Muscle cramps , nocturnal  Numbness, weakness  Restless legs  Blood pressure 124/79, pulse 100, height 6\' 3"  (1.905 m), weight 212 lb (96.163 kg).  Physical Exam  General: The patient is alert and cooperative at the time of the examination.  Eyes: Pupils are equal, round, and reactive to light. Discs are flat bilaterally.  Neck: The neck is supple, no carotid bruits are noted.  Respiratory: The respiratory examination is clear.  Cardiovascular: The cardiovascular examination reveals a regular rate and rhythm, no obvious murmurs or rubs are noted.  Skin: Extremities are without significant edema.  Neurologic Exam  Mental status: The patient is alert and oriented x 3 at the time of the examination. The patient has apparent normal recent and remote memory, with an apparently normal attention span and concentration ability.  Cranial nerves: Facial symmetry is present. There is good sensation of the face to pinprick and soft touch bilaterally. The strength of the facial muscles and the muscles to head turning and shoulder shrug are normal bilaterally. Speech is well enunciated, no aphasia or dysarthria is noted. Extraocular movements are full. Visual fields are full. The tongue is midline, and the patient has symmetric elevation of the soft palate. No obvious hearing deficits are noted.  Motor: The motor testing reveals 5 over 5 strength of all 4 extremities. Good symmetric motor tone is noted throughout.  Sensory: Sensory testing is intact to pinprick, soft touch, vibration sensation, and position sense on all 4 extremities. No evidence of extinction is noted.  Coordination: Cerebellar testing reveals good finger-nose-finger and heel-to-shin bilaterally.  Gait and station: Gait is normal. Tandem gait is normal. Romberg is negative. No drift is seen.  Reflexes: Deep tendon reflexes are symmetric and normal  bilaterally. Toes are downgoing bilaterally.   Assessment/Plan:   1. Right drop, peroneal neuropathy   2. Nocturnal leg cramps    3. Migraine headache    The patient will need to be followed for the right footdrop. The patient has an incomplete lesion of the peroneal nerve, he is still able to activate the muscles in the peroneal nerve distribution. I would follow the patient conservatively, follow-up in about or months to watch for progress with healing. The patient will be sent for blood work to look for etiologies of of mononeuritis multiplex. The patient believes that he is worsening, he is to let me know. If he is having problems with frequent stumbles or falls, he will contact me to get an AFO brace set up.  Jill Alexanders MD 09/28/2014 5:09 PM  [facility] [address] [place], [place] [address]  Phone [phone] Fax [phone]

## 2014-09-30 ENCOUNTER — Telehealth: Payer: Self-pay

## 2014-09-30 LAB — PAN-ANCA
Atypical pANCA: <1:20 {titer}
C-ANCA: <1:20 {titer}
Myeloperoxidase Ab: <9 U/mL (ref 0.0–9.0)
P-ANCA: <1:20 {titer}

## 2014-09-30 LAB — B. BURGDORFI ANTIBODIES: Lyme IgG/IgM Ab: <0.91 {ISR} (ref 0.00–0.90)

## 2014-09-30 LAB — ANA W/REFLEX: ANA: NEGATIVE

## 2014-09-30 LAB — SEDIMENTATION RATE: Sed Rate: 5 mm/hr (ref 0–30)

## 2014-09-30 LAB — ANGIOTENSIN CONVERTING ENZYME: Angio Convert Enzyme: 42 U/L (ref 14–82)

## 2014-09-30 LAB — RHEUMATOID FACTOR: Rhuematoid fact SerPl-aCnc: <7 IU/mL (ref 0.0–13.9)

## 2014-09-30 NOTE — Telephone Encounter (Signed)
Spoke with patient's wife, Estill Bamberg, and relayed the blood work results.

## 2014-09-30 NOTE — Telephone Encounter (Signed)
-----   Message from Kathrynn Ducking, MD sent at 09/30/2014  8:06 AM EDT -----  The blood work results are unremarkable. Please call the patient.   ----- Message -----    From: [facility] Lab Results In Interface    Sent: 09/27/2014   7:43 AM      To: Kathrynn Ducking, MD

## 2014-10-02 ENCOUNTER — Ambulatory Visit (INDEPENDENT_AMBULATORY_CARE_PROVIDER_SITE_OTHER): Payer: 59 | Admitting: Internal Medicine

## 2014-10-02 ENCOUNTER — Encounter: Payer: Self-pay | Admitting: Internal Medicine

## 2014-10-02 ENCOUNTER — Encounter (INDEPENDENT_AMBULATORY_CARE_PROVIDER_SITE_OTHER): Payer: Self-pay

## 2014-10-02 VITALS — BP 132/72 | HR 61 | Ht 75.0 in | Wt 212.0 lb

## 2014-10-02 DIAGNOSIS — R05 Cough: Secondary | ICD-10-CM | POA: Diagnosis not present

## 2014-10-02 DIAGNOSIS — R058 Other specified cough: Secondary | ICD-10-CM

## 2014-10-02 NOTE — Patient Instructions (Addendum)
Ok to taper off the extra acid suppression after another week but immediately go back to the high dose for cough flare for any reason  For drainage either take Zyrtec or  take chlortrimeton (chlorpheniramine) 4 mg every 4 hours available over the counter (may cause drowsiness)  Pulmonary f/u is as needed

## 2014-10-02 NOTE — Progress Notes (Signed)
Subjective:    Patient ID: Marvin Munoz, male    DOB: April 05, 1961, MRN: [id number]    Brief patient profile:  66 yowm never smoker ? Bronchitis as child but fine in HS through adulthood until March 2015 with bad cough p uri better p levaquin/pred/albuterol illness lasted 4 weeks  referred 09/10/2014 by Dr Mertha Finders same symptoms x 6 weeks since around Jul 21 2014.   Brief patient profile:  09/10/2014 1st [facility] office visit/ [facility]   Chief Complaint  Patient presents with  . Pulmonary Consult    Referred by Dr. Mertha Finders. Pt c/o cough and DOE for the past 5-6 wks. He states gets SOB with walking at his work and walking up stairs. Has not felt like golfing lately due to symptoms.  Cough is mainly non prod, but will occ produce minimal clear sputum.  Cough seems worse in the evenings.   cough worse after supper and worse at hs unless uses inhaler plus tussionex Takes prilosec with bfast  Cough worse with voice use Already on symbicort 160 2bid not helping cough? Helping breathing rec prilosec Take 30- 60 min before your first and last meals of the day and pepcid ac 20 mg at bedtime mucinex dm  1200 mg every 12 hours as needed For drainage take chlortrimeton (chlorpheniramine) 4 mg every 4 hours available over the counter (may cause drowsiness)  GERD  dulera 100 Take 2 puffs first thing in am and then another 2 puffs about 12 hours later - call to schedule ct if losing ground  If still coughing > take tussionex x 3 days straight while not coughing     10/02/2014 f/u ov/[facility] re: cough > sob  Chief Complaint  Patient presents with  . Follow-up    Pt states that his breathing is much improved since the last visit. Cough is better, but not completely resolved.      Not limited by breathing from desired activities/ has not tried 1st gen H1 rx as rec   / no longer using dulera    No obvious other patterns in day to day or daytime variabilty or assoc  cp or chest tightness,  subjective wheeze overt sinus or hb symptoms. No unusual exp hx or h/o childhood pna/ asthma or knowledge of premature birth.  Sleeping ok without nocturnal  or early am exacerbation  of respiratory  c/o's or need for noct saba. Also denies any obvious fluctuation of symptoms with weather or environmental changes or other aggravating or alleviating factors except as outlined above   Current Medications, Allergies, Complete Past Medical History, Past Surgical History, Family History, and Social History were reviewed in [employer] record.                             Objective:   Physical Exam  amb slt hoarse wm nad occ throat clearing  Wt Readings from Last 3 Encounters:  10/02/14 212 lb (96.163 kg)  09/26/14 212 lb (96.163 kg)  09/10/14 217 lb 4.8 oz (98.567 kg)    Vital signs reviewed      HEENT: nl dentition, turbinates, and orophanx. Nl external ear canals without cough reflex   NECK :  without JVD/Nodes/TM/ nl carotid upstrokes bilaterally   LUNGS: no acc muscle use, clear to A and P bilaterally without cough on insp or exp maneuvers   CV:  RRR  no s3 or murmur or increase  in P2, no edema   ABD:  soft and nontender with nl excursion in the supine position. No bruits or organomegaly, bowel sounds nl  MS:  warm without deformities, calf tenderness, cyanosis or clubbing  SKIN: warm and dry without lesions    NEURO:  alert, approp, no deficits    CT chest  09/01/14 Age-indeterminate focal bronchial wall thickening and subsegmental atelectasis within the left lower lobe - nonspecific though could be seen in the setting of geographic airways disease. Further evaluation could be performed with bronchoscopy as clinically indicated. Otherwise, no explanation for patient's persistent cough.      Assessment & Plan:

## 2014-10-05 ENCOUNTER — Encounter: Payer: Self-pay | Admitting: Internal Medicine

## 2014-10-05 NOTE — Assessment & Plan Note (Signed)
I had an extended summary discussion with the patient reviewing all relevant studies completed to date and  lasting 15 to 20 minutes of a 25 minute visit on the following ongoing concerns:   He clearly is much better now and now inhaler dependent and appears to have a recurrent self limited cough that is likely a form of  Classic Upper airway cough syndrome, so named because it's frequently impossible to sort out how much is  CR/sinusitis with freq throat clearing (which can be related to primary GERD)   vs  causing  secondary (" extra esophageal")  GERD from wide swings in gastric pressure that occur with throat clearing, often  promoting self use of mint and menthol lozenges that reduce the lower esophageal sphincter tone and exacerbate the problem further in a cyclical fashion.   These are the same pts (now being labeled as having "irritable larynx syndrome" by some cough centers) who not infrequently have a history of having failed to tolerate ace inhibitors,  dry powder inhalers or biphosphonates or report having atypical reflux symptoms that don't respond to standard doses of PPI , and are easily confused as having aecopd or asthma flares by even experienced allergists/ pulmonologists.  Ok to taper off gerd rx to see if flares but reminded him: Explained the natural history of uri and why it's necessary in patients at risk to treat GERD aggressively - at least  short term -   to reduce risk of evolving cyclical cough initially  triggered by epithelial injury and a heightened sensitivty to the effects of any upper airway irritants,  most importantly acid - related - then perpetuated by epithelial injury related to the cough itself as the upper airway collapses on itself.  That is, the more sensitive the epithelium becomes once it is damaged by the virus, the more the ensuing irritability> the more the cough, the more the secondary reflux (especially in those prone to reflux) the more the irritation of  the sensitive mucosa and so on in a  Classic cyclical pattern.   Strongly favor meds for rhinitis that are more effective at eliminating pnds/ with 1st gen the best but also the most sedating  See instructions for specific recommendations which were reviewed directly with the patient who was given a copy with highlighter outlining the key components.

## 2014-10-08 ENCOUNTER — Telehealth: Payer: Self-pay | Admitting: Neurology

## 2014-10-08 NOTE — Telephone Encounter (Signed)
I called the patient back. His lab results were normal.

## 2014-10-08 NOTE — Telephone Encounter (Signed)
Pt is calling requesting lab results.  Please call and advise.

## 2014-12-08 ENCOUNTER — Encounter: Payer: Self-pay | Admitting: Interventional Cardiology

## 2014-12-08 ENCOUNTER — Ambulatory Visit (INDEPENDENT_AMBULATORY_CARE_PROVIDER_SITE_OTHER): Payer: 59 | Admitting: Interventional Cardiology

## 2014-12-08 VITALS — BP 112/80 | HR 75 | Ht 75.0 in | Wt 207.4 lb

## 2014-12-08 DIAGNOSIS — I7789 Other specified disorders of arteries and arterioles: Secondary | ICD-10-CM

## 2014-12-08 DIAGNOSIS — I77819 Aortic ectasia, unspecified site: Secondary | ICD-10-CM | POA: Diagnosis not present

## 2014-12-08 DIAGNOSIS — I10 Essential (primary) hypertension: Secondary | ICD-10-CM

## 2014-12-08 DIAGNOSIS — I359 Nonrheumatic aortic valve disorder, unspecified: Secondary | ICD-10-CM

## 2014-12-08 NOTE — Patient Instructions (Signed)
Medication Instructions:  Your physician recommends that you continue on your current medications as directed. Please refer to the Current Medication list given to you today.   Labwork: None   Testing/Procedures: Your physician has requested that you have an echocardiogram. Echocardiography is a painless test that uses sound waves to create images of your heart. It provides your doctor with information about the size and shape of your heart and how well your heart's chambers and valves are working. This procedure takes approximately one hour. There are no restrictions for this procedure. (To be schedule in June 2017 prior to office visit)  Follow-Up: Your physician wants you to follow-up in: 1 year You will receive a reminder letter in the mail two months in advance. If you don't receive a letter, please call our office to schedule the follow-up appointment.   Any Other Special Instructions Will Be Listed Below (If Applicable). Your physician discussed the importance of regular exercise and recommended that you start or continue a regular exercise program for good health.

## 2014-12-08 NOTE — Progress Notes (Signed)
Cardiology Office Note   Date:  12/08/2014   ID:  Marvin Munoz Sep 20, 1960, MRN [id number]  PCP:  Henrine Screws, MD  Cardiologist:  Sinclair Grooms, MD   Chief Complaint  Patient presents with  . Cardiac Valve Problem    Bicuspid aortic valve      History of Present Illness: Marvin Munoz is a 54 y.o. male who presents for bicuspid aortic valve, mild to moderate aortic regurgitation, and normal LV function.  He is doing well. He has lost 15 pounds since last year. He denies orthopnea, PND, and dyspnea on exertion. He has occasional sense of smothery dyspnea when he lies down. Otherwise no complaints.  Past Medical History  Diagnosis Date  . Aortic insufficiency due to bicuspid aortic valve   . HTN (hypertension)   . Gastroesophageal reflux   . PVC's (premature ventricular contractions)     hx of benign  . Fever blister   . Migraine headache   . Bursitis of elbow     gouty olecranon, right elbow  . Guillain-Barre syndrome   . Allergic rhinitis   . BPH (benign prostatic hyperplasia)   . Eczema     forearm  . Restless legs syndrome (RLS)   . Foot drop, right 09/24/2014  . 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 Guillain Barr syndrome 1970's  . Guillain Barr syndrome   . Nocturnal leg cramps 09/26/2014    Past Surgical History  Procedure Laterality Date  . Breast lumpectomy Right   . Tonsillectomy    . Cardiac catheterization    . Colonoscopy       Current Outpatient Prescriptions  Medication Sig Dispense Refill  . allopurinol (ZYLOPRIM) 300 MG tablet Take 300 mg by mouth daily.     Marland Kitchen aspirin 81 MG tablet Take 81 mg by mouth daily.    . Calcium Carb-Cholecalciferol (CALCIUM + D3) 600-200 MG-UNIT TABS Take by mouth 2 (two) times daily.    . chlorpheniramine (CHLOR-TRIMETON) 4 MG tablet Take 4 mg by mouth at bedtime.    Marland Kitchen diltiazem (CARDIZEM CD) 240 MG 24 hr capsule Take 240 mg by mouth daily.     . famotidine (PEPCID) 20 MG tablet  Take 20 mg by mouth at bedtime.    . fexofenadine (ALLEGRA) 180 MG tablet Take 180 mg by mouth daily.    . meloxicam (MOBIC) 7.5 MG tablet Take 7.5 mg by mouth daily as needed (back pain).     . Multiple Vitamin (MULTIVITAMIN) tablet Take 1 tablet by mouth daily.    Marland Kitchen omeprazole (PRILOSEC) 40 MG capsule Take 40 mg by mouth daily.     . pramipexole (MIRAPEX) 0.125 MG tablet Take 0.125 mg by mouth at bedtime.    . psyllium (METAMUCIL) 58.6 % packet Take 1 packet by mouth daily.    . rizatriptan (MAXALT-MLT) 10 MG disintegrating tablet Take 10 mg by mouth as needed for migraine. May repeat in 2 hours if needed    . valACYclovir (VALTREX) 1000 MG tablet Take 500 mg by mouth as needed (cold sores).      No current facility-administered medications for this visit.    Allergies:   Colchicine and Influenza vaccines    Social History:  The patient  reports that he has never smoked. He has never used smokeless tobacco. He reports that he drinks alcohol. He reports that he does not use illicit drugs.   Family History:  The patient's family history includes  Asthma in his mother; COPD in his mother; Heart attack in his father; Leukemia in his maternal grandfather; Prostate cancer in his father, maternal grandfather, and maternal uncle.    ROS:  Please see the history of present illness.   Otherwise, review of systems are positive for none.   All other systems are reviewed and negative.    PHYSICAL EXAM: VS:  BP 112/80 mmHg  Pulse 75  Ht 6\' 3"  (1.905 m)  Wt 94.076 kg (207 lb 6.4 oz)  BMI 25.92 kg/m2 , BMI Body mass index is 25.92 kg/(m^2). GEN: Well nourished, well developed, in no acute distress HEENT: normal Neck: no JVD, carotid bruits, or masses Cardiac: RRR; no murmurs, rubs, or gallops,no edema  Respiratory:  clear to auscultation bilaterally, normal work of breathing GI: soft, nontender, nondistended, + BS MS: no deformity or atrophy Skin: warm and dry, no rash Neuro:  Strength and  sensation are intact Psych: euthymic mood, full affect   EKG:  EKG is ordered today. The ekg ordered today demonstrates normal   Recent Labs: No results found for requested labs within last 365 days.    Lipid Panel No results found for: CHOL, TRIG, HDL, CHOLHDL, VLDL, LDLCALC, LDLDIRECT    Wt Readings from Last 3 Encounters:  12/08/14 94.076 kg (207 lb 6.4 oz)  10/02/14 96.163 kg (212 lb)  09/26/14 96.163 kg (212 lb)      Other studies Reviewed: Additional studies/ records that were reviewed today include: .  Review of the above records demonstrates:    ASSESSMENT AND PLAN:  Aortic valve disorder - bicuspid aortic valve with mild aortic regurgitation and stenosis  Essential hypertension - controlled  Aortic root enlargement-  4.2 centimeters when last measured     Current medicines are reviewed at length with the patient today.  The patient does not have concerns regarding medicines.  The following changes have been made:  no change  Labs/ tests ordered today include: Echocardiogram prior to the next visit in one year  No orders of the defined types were placed in this encounter.     Disposition:   FU with HS in 1 year  Signed, Sinclair Grooms, MD  12/08/2014 4:59 PM    [facility] [address], [place], [place]  [address] Phone: [phone]; Fax: [phone]

## 2015-02-11 ENCOUNTER — Ambulatory Visit (INDEPENDENT_AMBULATORY_CARE_PROVIDER_SITE_OTHER): Payer: 59 | Admitting: Neurology

## 2015-02-11 ENCOUNTER — Encounter: Payer: Self-pay | Admitting: Neurology

## 2015-02-11 VITALS — BP 117/73 | HR 69 | Ht 75.0 in | Wt 217.5 lb

## 2015-02-11 DIAGNOSIS — G5731 Lesion of lateral popliteal nerve, right lower limb: Secondary | ICD-10-CM

## 2015-02-11 NOTE — Progress Notes (Signed)
Reason for visit: Right foot drop  Marvin Munoz is an 54 y.o. male  History of present illness:  Marvin Munoz is a 54 year old right-handed white male with a history of a right footdrop consistent with a peroneal neuropathy. This was confirmed by EMG and nerve conduction study, he does not have an underlying peripheral neuropathy but he does have a history of Guillain-Barr syndrome previously. He has noted no worsening and no improvement in the foot drop since last seen. He has not fallen, occasionally he will stumble. He denies any discomfort, does have some mild tingling in the toes at times. He denies any back pain or pain down the legs. He returns to this office for an evaluation. He has not required an AFO brace.  Past Medical History  Diagnosis Date  . Aortic insufficiency due to bicuspid aortic valve   . HTN (hypertension)   . Gastroesophageal reflux   . PVC's (premature ventricular contractions)     hx of benign  . Fever blister   . Migraine headache   . Bursitis of elbow     gouty olecranon, right elbow  . Guillain-Barre syndrome   . Allergic rhinitis   . BPH (benign prostatic hyperplasia)   . Eczema     forearm  . Restless legs syndrome (RLS)   . Foot drop, right 09/24/2014  . 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 Guillain Barr syndrome 1970's  . Guillain Barr syndrome   . Nocturnal leg cramps 09/26/2014    Past Surgical History  Procedure Laterality Date  . Breast lumpectomy Right   . Tonsillectomy    . Cardiac catheterization    . Colonoscopy      Family History  Problem Relation Age of Onset  . Heart attack Father   . Prostate cancer Father   . COPD Mother     smoked  . Asthma Mother   . Leukemia Maternal Grandfather   . Prostate cancer Maternal Grandfather   . Prostate cancer Maternal Uncle     Social history:  reports that he has never smoked. He has never used smokeless tobacco. He reports that he does not drink alcohol or use  illicit drugs.    Allergies  Allergen Reactions  . Colchicine Diarrhea  . Influenza Vaccines     Guillain-barre    Medications:  Prior to Admission medications   Medication Sig Start Date End Date Taking? Authorizing Provider  allopurinol (ZYLOPRIM) 300 MG tablet Take 300 mg by mouth daily.  10/25/13  Yes Historical Provider, MD  aspirin 81 MG tablet Take 81 mg by mouth daily.   Yes Historical Provider, MD  Calcium Carb-Cholecalciferol (CALCIUM + D3) 600-200 MG-UNIT TABS Take by mouth 2 (two) times daily.   Yes Historical Provider, MD  chlorpheniramine (CHLOR-TRIMETON) 4 MG tablet Take 4 mg by mouth at bedtime.   Yes Historical Provider, MD  diltiazem (CARDIZEM CD) 240 MG 24 hr capsule Take 240 mg by mouth daily.  11/01/13  Yes Historical Provider, MD  famotidine (PEPCID) 20 MG tablet Take 20 mg by mouth at bedtime.   Yes Historical Provider, MD  fexofenadine (ALLEGRA) 180 MG tablet Take 180 mg by mouth daily.   Yes Historical Provider, MD  meloxicam (MOBIC) 7.5 MG tablet Take 7.5 mg by mouth daily as needed (back pain).  09/26/14  Yes Historical Provider, MD  Multiple Vitamin (MULTIVITAMIN) tablet Take 1 tablet by mouth daily.   Yes Historical Provider, MD  omeprazole ([place])  40 MG capsule Take 40 mg by mouth daily.  10/25/13  Yes Historical Provider, MD  pramipexole (MIRAPEX) 0.125 MG tablet Take 0.125 mg by mouth at bedtime. 12/04/14  Yes Historical Provider, MD  psyllium (METAMUCIL) 58.6 % packet Take 1 packet by mouth daily.   Yes Historical Provider, MD  rizatriptan (MAXALT-MLT) 10 MG disintegrating tablet Take 10 mg by mouth as needed for migraine. May repeat in 2 hours if needed   Yes Historical Provider, MD  valACYclovir (VALTREX) 1000 MG tablet Take 500 mg by mouth as needed (cold sores).  10/25/13  Yes Historical Provider, MD    ROS:  Out of a complete 14 system review of symptoms, the patient complains only of the following symptoms, and all other reviewed systems are  negative.  Restless legs Back pain, muscle cramps Bruising easily Numbness  Blood pressure 117/73, pulse 69, height 6\' 3"  (1.905 m), weight 217 lb 8 oz (98.657 kg).  Physical Exam  General: The patient is alert and cooperative at the time of the examination.  Skin: No significant peripheral edema is noted.   Neurologic Exam  Mental status: The patient is alert and oriented x 3 at the time of the examination. The patient has apparent normal recent and remote memory, with an apparently normal attention span and concentration ability.   Cranial nerves: Facial symmetry is present. Speech is normal, no aphasia or dysarthria is noted. Extraocular movements are full. Visual fields are full.  Motor: The patient has good strength in all 4 extremities, with exception of weakness with dorsiflexion and eversion of the right foot.  Sensory examination: Soft touch sensation is symmetric on the face, arms, and legs.  Coordination: The patient has good finger-nose-finger and heel-to-shin bilaterally.  Gait and station: The patient has a normal gait. Tandem gait is normal. Romberg is negative. No drift is seen. The patient is able walk on the toes bilaterally.  Reflexes: Deep tendon reflexes are symmetric.   Assessment/Plan:  1. Right peroneal neuropathy  The patient is stable with his clinical examination. We will continue to follow him on a conservative basis, full healing may take greater than one year. We will follow-up in about 6 months.  Marvin Alexanders MD 02/11/2015 7:23 PM  [facility] [address] [place], [place] [address]  Phone [phone] Fax [phone]

## 2015-02-12 ENCOUNTER — Ambulatory Visit: Payer: Self-pay | Admitting: Neurology

## 2015-02-12 ENCOUNTER — Ambulatory Visit: Payer: 59 | Admitting: Neurology

## 2015-06-30 ENCOUNTER — Ambulatory Visit: Payer: Self-pay | Admitting: Neurology

## 2015-08-14 ENCOUNTER — Ambulatory Visit (INDEPENDENT_AMBULATORY_CARE_PROVIDER_SITE_OTHER): Payer: 59 | Admitting: Neurology

## 2015-08-14 ENCOUNTER — Encounter: Payer: Self-pay | Admitting: Neurology

## 2015-08-14 VITALS — BP 123/78 | HR 71 | Ht 75.5 in | Wt 221.5 lb

## 2015-08-14 DIAGNOSIS — M21371 Foot drop, right foot: Secondary | ICD-10-CM | POA: Diagnosis not present

## 2015-08-14 NOTE — Progress Notes (Signed)
Reason for visit: Footdrop  THERAN Munoz is an 56 y.o. male  History of present illness:  Marvin Munoz is a 55 year old right-handed white male with a history of a right-sided footdrop. He has a history of Guillain-Barr syndrome. He follows up, last being seen 6 months ago. He believes that there has been some improvement in the strength of the right leg. He is occasionally catching his right toe, but he has not had any falls. He indicates that the numbness of the foot has improved. He denies any discomfort. No other significant issues have come up since last seen.  Past Medical History  Diagnosis Date  . Aortic insufficiency due to bicuspid aortic valve   . HTN (hypertension)   . Gastroesophageal reflux   . PVC's (premature ventricular contractions)     hx of benign  . Fever blister   . Migraine headache   . Bursitis of elbow     gouty olecranon, right elbow  . Guillain-Barre syndrome ([place])   . Allergic rhinitis   . BPH (benign prostatic hyperplasia)   . Eczema     forearm  . Restless legs syndrome (RLS)   . Foot drop, right 09/24/2014  . 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 Guillain Barr syndrome ([place]) 1970's  . Guillain Barr syndrome ([place])   . Nocturnal leg cramps 09/26/2014    Past Surgical History  Procedure Laterality Date  . Breast lumpectomy Right   . Tonsillectomy    . Cardiac catheterization    . Colonoscopy      Family History  Problem Relation Age of Onset  . Heart attack Father   . Prostate cancer Father   . COPD Mother     smoked  . Asthma Mother   . Arthritis/Rheumatoid Mother   . Leukemia Maternal Grandfather   . Prostate cancer Maternal Grandfather   . Prostate cancer Maternal Uncle     Social history:  reports that he has never smoked. He has never used smokeless tobacco. He reports that he does not drink alcohol or use illicit drugs.    Allergies  Allergen Reactions  . Colchicine Diarrhea  . Influenza Vaccines    Guillain-barre    Medications:  Prior to Admission medications   Medication Sig Start Date End Date Taking? Authorizing Provider  allopurinol (ZYLOPRIM) 300 MG tablet Take 300 mg by mouth daily.  10/25/13  Yes Historical Provider, MD  aspirin 81 MG tablet Take 81 mg by mouth daily.   Yes Historical Provider, MD  Calcium Carb-Cholecalciferol (CALCIUM + D3) 600-200 MG-UNIT TABS Take by mouth 2 (two) times daily.   Yes Historical Provider, MD  chlorpheniramine (CHLOR-TRIMETON) 4 MG tablet Take 4 mg by mouth at bedtime.   Yes Historical Provider, MD  diltiazem (CARDIZEM CD) 240 MG 24 hr capsule Take 240 mg by mouth daily.  11/01/13  Yes Historical Provider, MD  famotidine (PEPCID) 20 MG tablet Take 20 mg by mouth at bedtime.   Yes Historical Provider, MD  fexofenadine (ALLEGRA) 180 MG tablet Take 180 mg by mouth daily.   Yes Historical Provider, MD  meloxicam (MOBIC) 7.5 MG tablet Take 7.5 mg by mouth daily as needed (back pain).  09/26/14  Yes Historical Provider, MD  Multiple Vitamin (MULTIVITAMIN) tablet Take 1 tablet by mouth daily.   Yes Historical Provider, MD  omeprazole (PRILOSEC) 40 MG capsule Take 40 mg by mouth daily.  10/25/13  Yes Historical Provider, MD  pramipexole (MIRAPEX) 0.125 MG  tablet Take 0.125 mg by mouth at bedtime. 12/04/14  Yes Historical Provider, MD  psyllium (METAMUCIL) 58.6 % packet Take 1 packet by mouth daily.   Yes Historical Provider, MD  rizatriptan (MAXALT-MLT) 10 MG disintegrating tablet Take 10 mg by mouth as needed for migraine. May repeat in 2 hours if needed   Yes Historical Provider, MD  valACYclovir (VALTREX) 1000 MG tablet Take 500 mg by mouth as needed (cold sores).  10/25/13  Yes Historical Provider, MD    ROS:  Out of a complete 14 system review of symptoms, the patient complains only of the following symptoms, and all other reviewed systems are negative.  Restless legs Joint pain  Blood pressure 123/78, pulse 71, height 6' 3.5" (1.918 m), weight 221 lb 8  oz (100.472 kg).  Physical Exam  General: The patient is alert and cooperative at the time of the examination.  Skin: No significant peripheral edema is noted.   Neurologic Exam  Mental status: The patient is alert and oriented x 3 at the time of the examination. The patient has apparent normal recent and remote memory, with an apparently normal attention span and concentration ability.   Cranial nerves: Facial symmetry is present. Speech is normal, no aphasia or dysarthria is noted. Extraocular movements are full. Visual fields are full.  Motor: The patient has good strength in all 4 extremities.  Sensory examination: Soft touch sensation is symmetric on the face, arms, and legs.  Coordination: The patient has good finger-nose-finger and heel-to-shin bilaterally.  Gait and station: The patient has a normal gait. Tandem gait is normal. Romberg is negative. No drift is seen. The patient is able to walk on heels and the toes bilaterally.  Reflexes: Deep tendon reflexes are symmetric, but are depressed.   Assessment/Plan:  1. Right peroneal neuropathy, resolved  2. History of Guillian Barr syndrome  The patient is doing quite well at this time. There is no discernible weakness with dorsiflexion or eversion of the right foot. The patient has made an excellent recovery. He will follow-up on an as-needed basis.  Jill Alexanders MD 08/14/2015 8:09 AM  [facility] [address] [place], [place] [address]  Phone [phone] Fax [phone]

## 2015-08-26 ENCOUNTER — Telehealth: Payer: Self-pay | Admitting: Interventional Cardiology

## 2015-08-26 NOTE — Telephone Encounter (Signed)
New message      Pt is calling to check the status on a surgical clearance from Dr Mardelle Matte at [facility].  Did we get it?  Has it been completed?  OK to leave msg on vm--pt is at work.

## 2015-08-26 NOTE — Telephone Encounter (Signed)
Returned pt call. Adv pt that we have not received a cardiac clearnace rqst for him. Provided pt our fax #. Pt voiced appreciation for the call and sts that he will have Ralie[facility] Ip (Dr.Landau) office refax the clearance.

## 2015-08-28 NOTE — Telephone Encounter (Signed)
Left message to call back  

## 2015-08-28 NOTE — Telephone Encounter (Signed)
Spoke with Marvin Munoz and informed him that we did receive the clearance and that it has been placed in Dr. Thompson Caul folder to review when he is back in the office. Marvin Munoz would like to be called once form is filled out and faxed. Will route to [facility] for follow up with Dr. Tamala Julian and Marvin Munoz.

## 2015-08-28 NOTE — Telephone Encounter (Signed)
Pt wants to know if his surgical clearance was received?

## 2015-08-31 NOTE — Telephone Encounter (Signed)
Pt aware. Cardiac clearance placed in MR nurse fax box to be faxed to Ralie[facility] Ip attn: Dr.Landau/Sherri

## 2015-10-06 ENCOUNTER — Ambulatory Visit (HOSPITAL_COMMUNITY): Payer: 59 | Attending: Orthopedic Surgery | Admitting: Physical Therapy

## 2015-10-06 DIAGNOSIS — M6281 Muscle weakness (generalized): Secondary | ICD-10-CM

## 2015-10-06 DIAGNOSIS — M25562 Pain in left knee: Secondary | ICD-10-CM | POA: Diagnosis present

## 2015-10-06 DIAGNOSIS — R600 Localized edema: Secondary | ICD-10-CM

## 2015-10-06 DIAGNOSIS — M25662 Stiffness of left knee, not elsewhere classified: Secondary | ICD-10-CM | POA: Diagnosis present

## 2015-10-06 DIAGNOSIS — R262 Difficulty in walking, not elsewhere classified: Secondary | ICD-10-CM

## 2015-10-06 NOTE — Therapy (Signed)
[facility] [address] [place], [place], [address] Phone: [phone]   Fax:  [phone]  Physical Therapy Evaluation  Patient Details  Name: Marvin Munoz MRN: [id number] Date of Birth: 1961/02/25 Referring Provider: Marchia Bond   Encounter Date: 10/06/2015      PT End of Session - 10/06/15 1739    Visit Number 1   Number of Visits 18   Date for PT Re-Evaluation 10/27/15   Authorization Type [employer]    Authorization Time Period 10/06/15 to 11/17/15   PT Start Time 1648   PT Stop Time 1726   PT Time Calculation (min) 38 min   Activity Tolerance Patient tolerated treatment well   Behavior During Therapy [facility] for tasks assessed/performed      Past Medical History  Diagnosis Date  . Aortic insufficiency due to bicuspid aortic valve   . HTN (hypertension)   . Gastroesophageal reflux   . PVC's (premature ventricular contractions)     hx of benign  . Fever blister   . Migraine headache   . Bursitis of elbow     gouty olecranon, right elbow  . Guillain-Barre syndrome ([place])   . Allergic rhinitis   . BPH (benign prostatic hyperplasia)   . Eczema     forearm  . Restless legs syndrome (RLS)   . Foot drop, right 09/24/2014  . 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 Guillain Barr syndrome ([place]) 1970's  . Guillain Barr syndrome ([place])   . Nocturnal leg cramps 09/26/2014    Past Surgical History  Procedure Laterality Date  . Breast lumpectomy Right   . Tonsillectomy    . Cardiac catheterization    . Colonoscopy      There were no vitals filed for this visit.       Subjective Assessment - 10/06/15 1651    Subjective Patient reports that he began having pain and swelling about 6-8 months ago; Xray showed arthritis. He had been getting injections but 4-5 weeks later he started having pain again. At this point Dr. Layne Benton did MRI, and they decided to perform the partial knee replacement which was done on March 23rd.   He has not had any HHPT, has only been doing exercises MD told him- leg lifts, knee flexion, hip abduction. Walking in general is OK, but staris are a little iffy; knee just really feels stiff and achey. No falls or close calls recently.    Pertinent History HTN, gout, foot drop R, history of GBS, history of cardiac cath     How long can you sit comfortably? 30 minutes   How long can you stand comfortably? has not tried standing extended periods, mabye 5-10 minutes    How long can you walk comfortably? has not tried a lot, maybe a couple of blocks but this would be pretty hard    Patient Stated Goals get knee back to where it was, loosen it up a bit, get out to do yardwork and golf    Currently in Pain? Yes   Pain Score 4    Pain Location Knee   Pain Orientation Left   Pain Descriptors / Indicators Aching   Pain Type Acute pain   Pain Radiating Towards on occasion, sharp pain proximally    Pain Onset 1 to 4 weeks ago   Pain Frequency Constant   Aggravating Factors  walking, standing, not moving knee around much    Pain Relieving Factors changing position  Effect of Pain on Daily Activities cannot do yardwork or a lot of outdoor tasks, cannot walk for long periods             [facility] PT Assessment - 10/06/15 0001    Assessment   Medical Diagnosis L partial knee replacement    Referring Provider Marchia Bond    Onset Date/Surgical Date 09/10/15   Next MD Visit Mardelle Matte on the 3rd    Balance Screen   Has the patient fallen in the past 6 months No   Has the patient had a decrease in activity level because of a fear of falling?  No   Is the patient reluctant to leave their home because of a fear of falling?  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    Leisure golf,  getting outside    Observation/Other Assessments   Focus on Therapeutic Outcomes (FOTO)  68% limited    AROM   Left Knee Extension 5   Left Knee Flexion 88   Strength   Right Hip Flexion 3+/5   Right Hip Extension 3/5   Right Hip ABduction 4/5   Left Hip Flexion 3+/5   Left Hip Extension 3/5   Left Hip ABduction 4/5   Right Knee Flexion 4-/5   Right Knee Extension 4/5   Left Knee Flexion 3-/5   Left Knee Extension 3+/5   Right Ankle Dorsiflexion 5/5   Left Ankle Dorsiflexion 4/5   Ambulation/Gait   Gait Comments hip hiking L LE, reduce L knee ROM during gait, reduced heel-toe pattern, reduced gait speed, reduced L TKE, reduced stance L/step R    6 minute walk test results    Aerobic Endurance Distance Walked 565   Endurance additional comments 3MWT; no device, no rest    Timed Up and Go Test   Normal TUG (seconds) 10.3                           PT Education - 10/06/15 1739    Education provided Yes   Education Details prognosis, plan of care, HEP    Person(s) Educated Patient   Methods Explanation;Demonstration;Handout   Comprehension Verbalized understanding;Returned demonstration;Need further instruction          PT Short Term Goals - 10/06/15 1748    PT SHORT TERM GOAL #1   Title Patient to demonstrate L knee ROM of 0-115 degrees in order to improve gait and overall mobility, reduce pain    Time 3   Period Weeks   Status New   PT SHORT TERM GOAL #2   Title Patient to demosntrate improved gait mechanics, including consistent heel-toe, no hip hiking or unsteadiness, equal step/stance each side, and improved L knee mobility during gait in order to enhance efficiency of gait over both even and unven surfaces with L knee pain no more than 1/10    Time 3   Period Weeks   Status New   PT SHORT TERM GOAL #3   Title Patient to be independent in correctly applying ice, performing retrograde massage, and performing appropraite exercises to manage edema  and pain in order to increase efficiency in managing condition    Time 3   Period Weeks   Status New   PT SHORT  TERM GOAL #4   Title Patient to be independent in correctly and consistently performing appropriate HEP, to be expanded PRN    Time 3   Period Weeks   Status New           PT Long Term Goals - 10/06/15 1750    PT LONG TERM GOAL #1   Title Patient to demonstrate strength 5/5 in all tested muscle groups in order to reduce pain, enhance regional stability, and improve overall mechanics   Time 6   Period Weeks   Status New   PT LONG TERM GOAL #2   Title Patient to be able to ambulate 1336ft during 6MWT in order to demosntrate readiness to return to safe community based activities and mobility    Time 6   Period Weeks   Status New   PT LONG TERM GOAL #3   Title Patient to be able to perform reciprocal stair ascent and descent with no unsteadiness, good eccentric control, and pain L knee no more than 1/10 in order to enhance independence and safety with community moblity    Time 6   Period Weeks   Status New   PT LONG TERM GOAL #4   Title Patient to report he has been able to complete all yardwork around his home, and has been able to return to light golfing activities with L knee pain no more than 1/10 in order to assist in returning to PLOF and improving QOL    Time 6   Period Weeks   Status New               Plan - 10/06/15 1741    Clinical Impression Statement Patient arrives status post L partial knee replacement, which he reports was performed in late March 2017; he reports that he did not having any PT after surgery, has just been performing exercises given to him by MD thus far. Upon examination, patient reveals significant L knee stiffness, impaired gait, muscle weakness, localized edema, reduced functional task performance skills, and in general L knee pain and discomfort. Incision appears well healing with no signs of inflammation or infection noted; no  gross erythema noted, and no edema beyond that which would be expected in L LE in general. At this point recommend skilled PT services in order to address functional limtations and to assist in reaching optimal level of function.    Rehab Potential Good   PT Frequency Other (comment)  2-3 times per week    PT Duration 6 weeks   PT Treatment/Interventions ADLs/Self Care Home Management;Cryotherapy;Gait training;Stair training;Functional mobility training;Therapeutic activities;Therapeutic exercise;Balance training;Neuromuscular re-education;Patient/family education;Manual techniques;Scar mobilization;Passive range of motion;Taping   PT Next Visit Plan review HEP and goals; focus on knee mobility first, gait training as well and edema control. As mobility improves, change focus to strength. Manual PRN.    PT Home Exercise Plan given, expand PRN    Consulted and Agree with Plan of Care Patient      Patient will benefit from skilled therapeutic intervention in order to improve the following deficits and impairments:  Abnormal gait, Hypomobility, Decreased skin integrity, Decreased scar mobility, Increased edema, Decreased strength, Pain, Difficulty walking, Decreased mobility, Increased muscle spasms, Improper body mechanics, Decreased range of motion, Impaired flexibility  Visit Diagnosis: Stiffness of left knee, not elsewhere classified - Plan: PT plan of care cert/re-cert  Localized edema - Plan: PT plan of care cert/re-cert  Muscle weakness (generalized) - Plan: PT plan of care cert/re-cert  Difficulty  in walking, not elsewhere classified - Plan: PT plan of care cert/re-cert  Pain in left knee - Plan: PT plan of care cert/re-cert     Problem List Patient Active Problem List   Diagnosis Date Noted  . Nocturnal leg cramps 09/26/2014  . Foot drop, right 09/24/2014  . 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 Upper airway cough syndrome 09/10/2014  . Aortic valve  disorder 11/19/2013  . Essential hypertension 11/19/2013  . Esophageal reflux 11/19/2013  . Gout 11/19/2013  . Aortic root enlargement ([place]) 11/19/2013    Deniece Ree PT, DPT [facility] [address] [place], [place], [address] Phone: [phone]   Fax:  [phone]  Name: Marvin Munoz MRN: [id number] Date of Birth: 05-Feb-1961

## 2015-10-06 NOTE — Patient Instructions (Signed)
   QUAD SET WITH TOWEL UNDER HEEL  While lying or sitting with a small towel roll under your ankle, tighten your top thigh muscle to press the back of your knee downward towards the ground.  Hold for 2-3 seconds, and relax; repeat 10-15 times, 3 times per day.    HIP ABDUCTION - SIDELYING  While lying on your side, slowly raise up your top leg to the side. Keep your knee straight and maintain your toes pointed forward the entire time. Keep your leg in-line with your body.  The bottom leg can be bent to stabilize your body.  Hold for 2-3 seconds and relax; repeat 10 times each side, 3 times per day.    HAMSTRING STRETCH - TABLE, BED OR COUCH  Sit on a raised flat surface where you can prop your affected leg up on it such as a treatment table, couch or bed.    While keeping your knee straight to slightly bent, slowly lean forward and reach your hands towards your foot until a gentle stretch is felt along the back of your knee/thigh. Hold and then return to starting position and repeat.  Hold each stretch for 30 seconds, and perform 3 times each leg, 2-3 times per day.     WEIGHT SHIFT - LATERAL  While in a standing position and knees partially bent, slowly shift your body weight side-to-side.   Repeat 20 times each side, 3 times per day.

## 2015-10-08 ENCOUNTER — Ambulatory Visit (HOSPITAL_COMMUNITY): Payer: 59 | Admitting: Physical Therapy

## 2015-10-08 DIAGNOSIS — M6281 Muscle weakness (generalized): Secondary | ICD-10-CM

## 2015-10-08 DIAGNOSIS — M25562 Pain in left knee: Secondary | ICD-10-CM

## 2015-10-08 DIAGNOSIS — R600 Localized edema: Secondary | ICD-10-CM

## 2015-10-08 DIAGNOSIS — M25662 Stiffness of left knee, not elsewhere classified: Secondary | ICD-10-CM | POA: Diagnosis not present

## 2015-10-08 DIAGNOSIS — R262 Difficulty in walking, not elsewhere classified: Secondary | ICD-10-CM

## 2015-10-08 NOTE — Therapy (Signed)
[facility] [address] [place], [place], [address] Phone: [phone]   Fax:  [phone]  Physical Therapy Treatment  Patient Details  Name: Marvin Munoz MRN: [id number] Date of Birth: 04/02/1961 Referring Provider: Marchia Bond   Encounter Date: 10/08/2015      PT End of Session - 10/08/15 0945    Visit Number 2   Number of Visits 18   Date for PT Re-Evaluation 10/27/15   Authorization Type UHC    Authorization Time Period 10/06/15 to 11/17/15   PT Start Time 0815   PT Stop Time 0905   PT Time Calculation (min) 50 min   Activity Tolerance Patient tolerated treatment well   Behavior During Therapy [facility] for tasks assessed/performed      Past Medical History  Diagnosis Date  . Aortic insufficiency due to bicuspid aortic valve   . HTN (hypertension)   . Gastroesophageal reflux   . PVC's (premature ventricular contractions)     hx of benign  . Fever blister   . Migraine headache   . Bursitis of elbow     gouty olecranon, right elbow  . Guillain-Barre syndrome ([place])   . Allergic rhinitis   . BPH (benign prostatic hyperplasia)   . Eczema     forearm  . Restless legs syndrome (RLS)   . Foot drop, right 09/24/2014  . 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 Guillain Barr syndrome ([place]) 1970's  . Guillain Barr syndrome ([place])   . Nocturnal leg cramps 09/26/2014    Past Surgical History  Procedure Laterality Date  . Breast lumpectomy Right   . Tonsillectomy    . Cardiac catheterization    . Colonoscopy      There were no vitals filed for this visit.      Subjective Assessment - 10/08/15 0825    Subjective Pt reports compliance with HEP.  States he is having more stiffness/achiness than pain.    Currently in Pain? Yes   Pain Score 4    Pain Location Knee   Pain Orientation Left   Pain Descriptors / Indicators Aching   Pain Type Acute pain                         [facility] Adult PT  Treatment/Exercise - 10/08/15 0942    Knee/Hip Exercises: Aerobic   Stationary Bike rocking and bkwd revolutions for ROM 5 minutes seat 18   Knee/Hip Exercises: Supine   Quad Sets Left;10 reps   Short Arc Quad Sets Left;10 reps   Heel Slides Left;5 reps   Straight Leg Raises Left;10 reps   Knee/Hip Exercises: Sidelying   Hip ABduction Left;10 reps   Knee/Hip Exercises: Prone   Hamstring Curl 10 reps   Hip Extension Left;10 reps   Manual Therapy   Manual Therapy Soft tissue mobilization;Myofascial release;Passive ROM   Manual therapy comments completed separately from all other therex and interventions   Soft tissue mobilization supine Lt knee    Myofascial Release Lt knee/scar and inferior region to decrease adhesions   Passive ROM to increase flexion 90 degrees achieved today                PT Education - 10/08/15 0949    Education provided Yes   Education Details review of HEP and initial evaluation given and goals reviewed   Person(s) Educated Patient   Methods Explanation;Handout;Demonstration   Comprehension Verbalized understanding;Returned demonstration;Verbal cues required;Tactile  cues required          PT Short Term Goals - 10/06/15 1748    PT SHORT TERM GOAL #1   Title Patient to demonstrate L knee ROM of 0-115 degrees in order to improve gait and overall mobility, reduce pain    Time 3   Period Weeks   Status New   PT SHORT TERM GOAL #2   Title Patient to demosntrate improved gait mechanics, including consistent heel-toe, no hip hiking or unsteadiness, equal step/stance each side, and improved L knee mobility during gait in order to enhance efficiency of gait over both even and unven surfaces with L knee pain no more than 1/10    Time 3   Period Weeks   Status New   PT SHORT TERM GOAL #3   Title Patient to be independent in correctly applying ice, performing retrograde massage, and performing appropraite exercises to manage edema and pain in order to  increase efficiency in managing condition    Time 3   Period Weeks   Status New   PT SHORT TERM GOAL #4   Title Patient to be independent in correctly and consistently performing appropriate HEP, to be expanded PRN    Time 3   Period Weeks   Status New           PT Long Term Goals - 10/06/15 1750    PT LONG TERM GOAL #1   Title Patient to demonstrate strength 5/5 in all tested muscle groups in order to reduce pain, enhance regional stability, and improve overall mechanics   Time 6   Period Weeks   Status New   PT LONG TERM GOAL #2   Title Patient to be able to ambulate 1322ft during 6MWT in order to demosntrate readiness to return to safe community based activities and mobility    Time 6   Period Weeks   Status New   PT LONG TERM GOAL #3   Title Patient to be able to perform reciprocal stair ascent and descent with no unsteadiness, good eccentric control, and pain L knee no more than 1/10 in order to enhance independence and safety with community moblity    Time 6   Period Weeks   Status New   PT LONG TERM GOAL #4   Title Patient to report he has been able to complete all yardwork around his home, and has been able to return to light golfing activities with L knee pain no more than 1/10 in order to assist in returning to PLOF and improving QOL    Time 6   Period Weeks   Status New               Plan - 10/08/15 [id number]    Clinical Impression Statement Continued with ROM and gentle strengthening therex.  PT with compliance completing HEP and is also stretching his knee on top of a footstool.  Progressed with prone therex and added SLR and SAQ to work on decreasing extension lag through quad strengthening.  Pt able to complete full revolutions in retro direction today.   Iniitial evaluation given and reviewed as well as HEP.   Rehab Potential Good   PT Frequency Other (comment)  2-3 times per week    PT Duration 6 weeks   PT Treatment/Interventions ADLs/Self Care Home  Management;Cryotherapy;Gait training;Stair training;Functional mobility training;Therapeutic activities;Therapeutic exercise;Balance training;Neuromuscular re-education;Patient/family education;Manual techniques;Scar mobilization;Passive range of motion;Taping   PT Next Visit Plan Continue to focus on knee mobility first,  gait training as well and edema control. As mobility improves, change focus to strength. Manual PRN.    Consulted and Agree with Plan of Care Patient      Patient will benefit from skilled therapeutic intervention in order to improve the following deficits and impairments:  Abnormal gait, Hypomobility, Decreased skin integrity, Decreased scar mobility, Increased edema, Decreased strength, Pain, Difficulty walking, Decreased mobility, Increased muscle spasms, Improper body mechanics, Decreased range of motion, Impaired flexibility  Visit Diagnosis: Stiffness of left knee, not elsewhere classified  Localized edema  Muscle weakness (generalized)  Difficulty in walking, not elsewhere classified  Pain in left knee     Problem List Patient Active Problem List   Diagnosis Date Noted  . Nocturnal leg cramps 09/26/2014  . Foot drop, right 09/24/2014  . 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 Upper airway cough syndrome 09/10/2014  . Aortic valve disorder 11/19/2013  . Essential hypertension 11/19/2013  . Esophageal reflux 11/19/2013  . Gout 11/19/2013  . Aortic root enlargement ([place]) 11/19/2013    Teena Irani, PTA/CLT [phone]  10/08/2015, 9:50 AM  [facility] [address] [place], [place], [address] Phone: [phone]   Fax:  [phone]  Name: Marvin Munoz MRN: [id number] Date of Birth: April 06, 1961

## 2015-10-13 ENCOUNTER — Ambulatory Visit (HOSPITAL_COMMUNITY): Payer: 59 | Admitting: Physical Therapy

## 2015-10-13 DIAGNOSIS — M25662 Stiffness of left knee, not elsewhere classified: Secondary | ICD-10-CM

## 2015-10-13 DIAGNOSIS — M6281 Muscle weakness (generalized): Secondary | ICD-10-CM

## 2015-10-13 DIAGNOSIS — R262 Difficulty in walking, not elsewhere classified: Secondary | ICD-10-CM

## 2015-10-13 DIAGNOSIS — M25562 Pain in left knee: Secondary | ICD-10-CM

## 2015-10-13 DIAGNOSIS — R600 Localized edema: Secondary | ICD-10-CM

## 2015-10-13 NOTE — Therapy (Signed)
[facility] [address] [place], [place], [address] Phone: [phone]   Fax:  [phone]  Physical Therapy Treatment  Patient Details  Name: Marvin Munoz MRN: [id number] Date of Birth: [birth year] Referring Provider: Marchia Bond   Encounter Date: 10/13/2015      PT End of Session - 10/13/15 1510    Visit Number 3   Number of Visits 18   Date for PT Re-Evaluation 10/27/15   Authorization Type UHC    Authorization Time Period 10/06/15 to 11/17/15   PT Start Time 1430   PT Stop Time 1509   PT Time Calculation (min) 39 min   Activity Tolerance Patient tolerated treatment well   Behavior During Therapy [facility] for tasks assessed/performed      Past Medical History  Diagnosis Date  . Aortic insufficiency due to bicuspid aortic valve   . HTN (hypertension)   . Gastroesophageal reflux   . PVC's (premature ventricular contractions)     hx of benign  . Fever blister   . Migraine headache   . Bursitis of elbow     gouty olecranon, right elbow  . Guillain-Barre syndrome ([place])   . Allergic rhinitis   . BPH (benign prostatic hyperplasia)   . Eczema     forearm  . Restless legs syndrome (RLS)   . Foot drop, right 09/24/2014  . 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 Guillain Barr syndrome ([place]) 1970's  . Guillain Barr syndrome ([place])   . Nocturnal leg cramps 09/26/2014    Past Surgical History  Procedure Laterality Date  . Breast lumpectomy Right   . Tonsillectomy    . Cardiac catheterization    . Colonoscopy      There were no vitals filed for this visit.      Subjective Assessment - 10/13/15 1431    Subjective Patient reports no major events today, pain is OK and he has already done some of his exercises    Pertinent History HTN, gout, foot drop R, history of GBS, history of cardiac cath     Patient Stated Goals get knee back to where it was, loosen it up a bit, get out to do yardwork and golf    Currently in  Pain? Yes   Pain Score 4    Pain Location Knee   Pain Orientation Left                         [facility] Adult PT Treatment/Exercise - 10/13/15 0001    Knee/Hip Exercises: Stretches   Active Hamstring Stretch Both;3 reps;30 seconds   Active Hamstring Stretch Limitations 12 inch box    Quad Stretch Both;3 reps;30 seconds   Quad Stretch Limitations prone    Piriformis Stretch Both;2 reps;30 seconds   Piriformis Stretch Limitations seated   Gastroc Stretch Both;3 reps;30 seconds   Gastroc Stretch Limitations slantboard    Knee/Hip Exercises: Aerobic   Stationary Bike rocking and bkwd revolutions for ROM 6 minutes seat 18  performed at end of session, not included in billing    Knee/Hip Exercises: Standing   Rocker Board 2 minutes   Rocker Board Limitations AP and alteral, U HHA    Gait Training gait training on level surface with cues for heel-toe gait pattern    Knee/Hip Exercises: Seated   Other Seated Knee/Hip Exercises AAROM knee flexion 1x15 with 3 second holds    Knee/Hip Exercises: Supine   Quad  Sets Left;1 set;15 reps   Quad Sets Limitations 3 second hold    Short Arc Quad Sets Left;1 set;15 reps   Short Arc Quad Sets Limitations 3 second hold    Manual Therapy   Manual Therapy Joint mobilization   Manual therapy comments completed separately from all other therex and interventions   Joint Mobilization tib/femoral mobilziations grade 2-3, patella mobilizations grade 2-3                 PT Education - 10/13/15 1510    Education provided Yes   Education Details generic knee ROM as recovery progresses from total and partial replacement surgeries    Person(s) Educated Patient   Methods Explanation   Comprehension Verbalized understanding          PT Short Term Goals - 10/06/15 1748    PT SHORT TERM GOAL #1   Title Patient to demonstrate L knee ROM of 0-115 degrees in order to improve gait and overall mobility, reduce pain    Time 3   Period  Weeks   Status New   PT SHORT TERM GOAL #2   Title Patient to demosntrate improved gait mechanics, including consistent heel-toe, no hip hiking or unsteadiness, equal step/stance each side, and improved L knee mobility during gait in order to enhance efficiency of gait over both even and unven surfaces with L knee pain no more than 1/10    Time 3   Period Weeks   Status New   PT SHORT TERM GOAL #3   Title Patient to be independent in correctly applying ice, performing retrograde massage, and performing appropraite exercises to manage edema and pain in order to increase efficiency in managing condition    Time 3   Period Weeks   Status New   PT SHORT TERM GOAL #4   Title Patient to be independent in correctly and consistently performing appropriate HEP, to be expanded PRN    Time 3   Period Weeks   Status New           PT Long Term Goals - 10/06/15 1750    PT LONG TERM GOAL #1   Title Patient to demonstrate strength 5/5 in all tested muscle groups in order to reduce pain, enhance regional stability, and improve overall mechanics   Time 6   Period Weeks   Status New   PT LONG TERM GOAL #2   Title Patient to be able to ambulate 131ft during 6MWT in order to demosntrate readiness to return to safe community based activities and mobility    Time 6   Period Weeks   Status New   PT LONG TERM GOAL #3   Title Patient to be able to perform reciprocal stair ascent and descent with no unsteadiness, good eccentric control, and pain L knee no more than 1/10 in order to enhance independence and safety with community moblity    Time 6   Period Weeks   Status New   PT LONG TERM GOAL #4   Title Patient to report he has been able to complete all yardwork around his home, and has been able to return to light golfing activities with L knee pain no more than 1/10 in order to assist in returning to PLOF and improving QOL    Time 6   Period Weeks   Status New               Plan -  10/13/15 1450    Clinical Impression  Statement Focused on functional ROM and gentle strengthenig today; patient continues to remain most limtied in knee flexion and introduced joint mobilizations today in order to assist in improviing ROM. Incision now appears well healed and ready for mobilization/scar massage. Otherwise pereformed closed chain pregait and gait training/balance based tasks this session.  Finsihed session on bike, seat 18, with full forwards revolution for ROM.    Rehab Potential Good   PT Frequency Other (comment)   PT Duration 6 weeks   PT Treatment/Interventions ADLs/Self Care Home Management;Cryotherapy;Gait training;Stair training;Functional mobility training;Therapeutic activities;Therapeutic exercise;Balance training;Neuromuscular re-education;Patient/family education;Manual techniques;Scar mobilization;Passive range of motion;Taping   PT Next Visit Plan Continue to focus on knee mobility first, gait training as well and edema control. As mobility improves, change focus to strength. Manual PRN. Teach scar massage.    PT Home Exercise Plan given, expand PRN    Consulted and Agree with Plan of Care Patient      Patient will benefit from skilled therapeutic intervention in order to improve the following deficits and impairments:  Abnormal gait, Hypomobility, Decreased skin integrity, Decreased scar mobility, Increased edema, Decreased strength, Pain, Difficulty walking, Decreased mobility, Increased muscle spasms, Improper body mechanics, Decreased range of motion, Impaired flexibility  Visit Diagnosis: Stiffness of left knee, not elsewhere classified  Localized edema  Muscle weakness (generalized)  Difficulty in walking, not elsewhere classified  Pain in left knee     Problem List Patient Active Problem List   Diagnosis Date Noted  . Nocturnal leg cramps 09/26/2014  . Foot drop, right 09/24/2014  . 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 Upper  airway cough syndrome 09/10/2014  . Aortic valve disorder 11/19/2013  . Essential hypertension 11/19/2013  . Esophageal reflux 11/19/2013  . Gout 11/19/2013  . Aortic root enlargement ([place]) 11/19/2013    Deniece Ree PT, DPT [facility] [address] [place], [place], [address] Phone: [phone]   Fax:  [phone]  Name: Marvin Munoz MRN: [id number] Date of Birth: [birth year]

## 2015-10-15 ENCOUNTER — Ambulatory Visit (HOSPITAL_COMMUNITY): Payer: 59 | Admitting: Physical Therapy

## 2015-10-15 DIAGNOSIS — R600 Localized edema: Secondary | ICD-10-CM

## 2015-10-15 DIAGNOSIS — M6281 Muscle weakness (generalized): Secondary | ICD-10-CM

## 2015-10-15 DIAGNOSIS — R262 Difficulty in walking, not elsewhere classified: Secondary | ICD-10-CM

## 2015-10-15 DIAGNOSIS — M25662 Stiffness of left knee, not elsewhere classified: Secondary | ICD-10-CM

## 2015-10-15 DIAGNOSIS — M25562 Pain in left knee: Secondary | ICD-10-CM

## 2015-10-15 NOTE — Therapy (Signed)
[facility] [address] [place], [place], [address] Phone: [phone]   Fax:  [phone]  Physical Therapy Treatment  Patient Details  Name: Marvin Munoz MRN: [id number] Date of Birth: February 11, 1961 Referring Provider: Marchia Bond   Encounter Date: 10/15/2015      PT End of Session - 10/15/15 0948    Visit Number 4   Number of Visits 18   Date for PT Re-Evaluation 10/27/15   Authorization Type UHC    Authorization Time Period 10/06/15 to 11/17/15   PT Start Time 0900   PT Stop Time 0941   PT Time Calculation (min) 41 min   Activity Tolerance Patient tolerated treatment well   Behavior During Therapy [facility] for tasks assessed/performed      Past Medical History  Diagnosis Date  . Aortic insufficiency due to bicuspid aortic valve   . HTN (hypertension)   . Gastroesophageal reflux   . PVC's (premature ventricular contractions)     hx of benign  . Fever blister   . Migraine headache   . Bursitis of elbow     gouty olecranon, right elbow  . Guillain-Barre syndrome ([address])   . Allergic rhinitis   . BPH (benign prostatic hyperplasia)   . Eczema     forearm  . Restless legs syndrome (RLS)   . Foot drop, right 09/24/2014  . 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 Guillain Barr syndrome ([place]) 1970's  . Guillain Barr syndrome ([place])   . Nocturnal leg cramps 09/26/2014    Past Surgical History  Procedure Laterality Date  . Breast lumpectomy Right   . Tonsillectomy    . Cardiac catheterization    . Colonoscopy      There were no vitals filed for this visit.      Subjective Assessment - 10/15/15 0901    Subjective Patient rpeorts he felt very well after last session, really felt like he started to notice a difference in his overall mobility; pain reduced this morning as well    Pertinent History HTN, gout, foot drop R, history of GBS, history of cardiac cath     Patient Stated Goals get knee back to where it was,  loosen it up a bit, get out to do yardwork and golf    Currently in Pain? Yes   Pain Score 3    Pain Location Knee   Pain Orientation Left   Pain Descriptors / Indicators Aching   Pain Type Acute pain   Pain Radiating Towards up and down leg a little bit    Pain Onset 1 to 4 weeks ago   Pain Frequency Constant   Aggravating Factors  walking, standing, not moving much    Pain Relieving Factors changing position, ice    Effect of Pain on Daily Activities cannot do outdoor yard work, cannot walk for long periods             [facility] PT Assessment - 10/15/15 0001    AROM   Left Knee Extension 1   Left Knee Flexion 95                     [facility] Adult PT Treatment/Exercise - 10/15/15 0001    Knee/Hip Exercises: Stretches   Active Hamstring Stretch Both;3 reps;30 seconds   Active Hamstring Stretch Limitations 12 inch box    Quad Stretch Both;3 reps;30 seconds   Quad Stretch Limitations prone    Piriformis Stretch Both;2  reps;30 seconds   Piriformis Stretch Limitations seated   Gastroc Stretch Both;3 reps;30 seconds   Gastroc Stretch Limitations slantboard    Knee/Hip Exercises: Standing   Rocker Board 2 minutes   Rocker Board Limitations AP and alteral, U HHA    Knee/Hip Exercises: Supine   Quad Sets Left;1 set;15 reps   [employer] Limitations 5 second holds    Short Arc [employer] Left;1 set;15 reps   Short Arc Quad Sets Limitations 5 second holds    Straight Leg Raises Left;15 reps   Knee/Hip Exercises: Sidelying   Hip ABduction Left;15 reps   Manual Therapy   Manual Therapy Joint mobilization   Manual therapy comments completed separately from all other therex and interventions   Joint Mobilization tib/femoral mobilziations grade 3, patella mobilizations grade 3                 PT Education - 10/15/15 [phone]    Education provided Yes   Education Details scar massage techniques and frequency    Person(s) Educated Patient   Methods Explanation    Comprehension Verbalized understanding          PT Short Term Goals - 10/06/15 1748    PT SHORT TERM GOAL #1   Title Patient to demonstrate L knee ROM of 0-115 degrees in order to improve gait and overall mobility, reduce pain    Time 3   Period Weeks   Status New   PT SHORT TERM GOAL #2   Title Patient to demosntrate improved gait mechanics, including consistent heel-toe, no hip hiking or unsteadiness, equal step/stance each side, and improved L knee mobility during gait in order to enhance efficiency of gait over both even and unven surfaces with L knee pain no more than 1/10    Time 3   Period Weeks   Status New   PT SHORT TERM GOAL #3   Title Patient to be independent in correctly applying ice, performing retrograde massage, and performing appropraite exercises to manage edema and pain in order to increase efficiency in managing condition    Time 3   Period Weeks   Status New   PT SHORT TERM GOAL #4   Title Patient to be independent in correctly and consistently performing appropriate HEP, to be expanded PRN    Time 3   Period Weeks   Status New           PT Long Term Goals - 10/06/15 1750    PT LONG TERM GOAL #1   Title Patient to demonstrate strength 5/5 in all tested muscle groups in order to reduce pain, enhance regional stability, and improve overall mechanics   Time 6   Period Weeks   Status New   PT LONG TERM GOAL #2   Title Patient to be able to ambulate 1336ft during 6MWT in order to demosntrate readiness to return to safe community based activities and mobility    Time 6   Period Weeks   Status New   PT LONG TERM GOAL #3   Title Patient to be able to perform reciprocal stair ascent and descent with no unsteadiness, good eccentric control, and pain L knee no more than 1/10 in order to enhance independence and safety with community moblity    Time 6   Period Weeks   Status New   PT LONG TERM GOAL #4   Title Patient to report he has been able to complete  all yardwork around his home, and has  been able to return to light golfing activities with L knee pain no more than 1/10 in order to assist in returning to PLOF and improving QOL    Time [place] - 10/15/15 0949    Clinical Impression Statement Patient arrives reporting that he felt very good after last session, defniitely feels like his knee is loosening up. Due to success of last session, performed similar techniques today, with focus on knee mobility and continued manual mobilizations to knee to continue in improving general mobility today, with good tolerance to all interventions this session. Measured knee ROM and found it to be 1-95 today. Put patient on on bike at end of session for unsupervised ROM work today.    Rehab Potential Good   PT Frequency Other (comment)   PT Duration 6 weeks   PT Treatment/Interventions ADLs/Self Care Home Management;Cryotherapy;Gait training;Stair training;Functional mobility training;Therapeutic activities;Therapeutic exercise;Balance training;Neuromuscular re-education;Patient/family education;Manual techniques;Scar mobilization;Passive range of motion;Taping   PT Next Visit Plan Continue to focus on knee mobility first, gait training as well and edema control. As mobility improves, change focus to strength. Manual PRN. Teach scar massage.    PT Home Exercise Plan given, expand PRN    Consulted and Agree with Plan of Care Patient      Patient will benefit from skilled therapeutic intervention in order to improve the following deficits and impairments:  Abnormal gait, Hypomobility, Decreased skin integrity, Decreased scar mobility, Increased edema, Decreased strength, Pain, Difficulty walking, Decreased mobility, Increased muscle spasms, Improper body mechanics, Decreased range of motion, Impaired flexibility  Visit Diagnosis: Stiffness of left knee, not elsewhere classified  Localized edema  Muscle  weakness (generalized)  Difficulty in walking, not elsewhere classified  Pain in left knee     Problem List Patient Active Problem List   Diagnosis Date Noted  . Nocturnal leg cramps 09/26/2014  . Foot drop, right 09/24/2014  . 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 Upper airway cough syndrome 09/10/2014  . Aortic valve disorder 11/19/2013  . Essential hypertension 11/19/2013  . Esophageal reflux 11/19/2013  . Gout 11/19/2013  . Aortic root enlargement ([place]) 11/19/2013    Deniece Ree PT, DPT [facility] [address] [place], [place], [address] Phone: [phone]   Fax:  [phone]  Name: Marvin Munoz MRN: [id number] Date of Birth: 03-05-1961

## 2015-10-20 ENCOUNTER — Ambulatory Visit (HOSPITAL_COMMUNITY): Payer: 59 | Attending: Orthopedic Surgery | Admitting: Physical Therapy

## 2015-10-20 DIAGNOSIS — M6281 Muscle weakness (generalized): Secondary | ICD-10-CM | POA: Insufficient documentation

## 2015-10-20 DIAGNOSIS — R600 Localized edema: Secondary | ICD-10-CM | POA: Diagnosis present

## 2015-10-20 DIAGNOSIS — M25562 Pain in left knee: Secondary | ICD-10-CM | POA: Insufficient documentation

## 2015-10-20 DIAGNOSIS — R262 Difficulty in walking, not elsewhere classified: Secondary | ICD-10-CM | POA: Diagnosis present

## 2015-10-20 DIAGNOSIS — M25662 Stiffness of left knee, not elsewhere classified: Secondary | ICD-10-CM | POA: Insufficient documentation

## 2015-10-20 NOTE — Therapy (Signed)
[facility] [address] [place], [place], [address] Phone: [phone]   Fax:  [phone]  Physical Therapy Treatment  Patient Details  Name: Marvin Munoz MRN: [id number] Date of Birth: 11-02-1960 Referring Provider: Marchia Bond   Encounter Date: 10/20/2015      PT End of Session - 10/20/15 0905    Visit Number 5   Number of Visits 18   Date for PT Re-Evaluation 10/27/15   Authorization Type U[employer]    Authorization Time Period 10/06/15 to 11/17/15   PT Start Time 0814   PT Stop Time 0857   PT Time Calculation (min) 43 min   Activity Tolerance Patient tolerated treatment well   Behavior During Therapy [facility] for tasks assessed/performed      Past Medical History  Diagnosis Date  . Aortic insufficiency due to bicuspid aortic valve   . HTN (hypertension)   . Gastroesophageal reflux   . PVC's (premature ventricular contractions)     hx of benign  . Fever blister   . Migraine headache   . Bursitis of elbow     gouty olecranon, right elbow  . Guillain-Barre syndrome ([place])   . Allergic rhinitis   . BPH (benign prostatic hyperplasia)   . Eczema     forearm  . Restless legs syndrome (RLS)   . Foot drop, right 09/24/2014  . 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 Guillain Barr syndrome ([place]) 1970's  . Guillain Barr syndrome ([place])   . Nocturnal leg cramps 09/26/2014    Past Surgical History  Procedure Laterality Date  . Breast lumpectomy Right   . Tonsillectomy    . Cardiac catheterization    . Colonoscopy      There were no vitals filed for this visit.      Subjective Assessment - 10/20/15 0816    Subjective Patient reports that he had a rough weekend, just felt very achey and sore all weekend long and is wondering if he overdid it this past weekend. Felt fine after treatment Thursday but it really started bothering him after waking up on Friday morning.    Pertinent History HTN, gout, foot drop R, history of  GBS, history of cardiac cath     Currently in Pain? Yes   Pain Score 6    Pain Location Knee   Pain Orientation Left   Pain Descriptors / Indicators Aching                         [facility] Adult PT Treatment/Exercise - 10/20/15 0001    Knee/Hip Exercises: Stretches   Active Hamstring Stretch Both;3 reps;30 seconds   Active Hamstring Stretch Limitations 12 inch box    Quad Stretch Both;3 reps;30 seconds   Quad Stretch Limitations prone    Knee: Self-Stretch to increase Flexion Left   Knee: Self-Stretch Limitations 15 reps, 10 second hold each    Piriformis Stretch Both;2 reps;30 seconds   Piriformis Stretch Limitations seated   Gastroc Stretch Both;3 reps;30 seconds   Gastroc Stretch Limitations slantboard    Knee/Hip Exercises: Standing   Rocker Board 2 minutes   Rocker Board Limitations AP and alteral, U HHA    Knee/Hip Exercises: Seated   Other Seated Knee/Hip Exercises AAROM knee flexion 1x15 with 5 second holds    Knee/Hip Exercises: Supine   Quad Sets Left;1 set;20 reps   [employer] Limitations 3 second holds    Short [employer]  Sets Left;1 set;15 reps   Short Arc Quad Sets Limitations 5 second holds    Straight Leg Raises Left;15 reps   Other Supine Knee/Hip Exercises supine hip ABD 1x10 with red TB    Manual Therapy   Manual Therapy Edema management   Manual therapy comments completed separately from all other therex and interventions   Edema Management retrograde massage approx 4 minutes                 PT Education - 10/20/15 0905    Education provided No          PT Short Term Goals - 10/06/15 1748    PT SHORT TERM GOAL #1   Title Patient to demonstrate L knee ROM of 0-115 degrees in order to improve gait and overall mobility, reduce pain    Time 3   Period Weeks   Status New   PT SHORT TERM GOAL #2   Title Patient to demosntrate improved gait mechanics, including consistent heel-toe, no hip hiking or unsteadiness, equal step/stance  each side, and improved L knee mobility during gait in order to enhance efficiency of gait over both even and unven surfaces with L knee pain no more than 1/10    Time 3   Period Weeks   Status New   PT SHORT TERM GOAL #3   Title Patient to be independent in correctly applying ice, performing retrograde massage, and performing appropraite exercises to manage edema and pain in order to increase efficiency in managing condition    Time 3   Period Weeks   Status New   PT SHORT TERM GOAL #4   Title Patient to be independent in correctly and consistently performing appropriate HEP, to be expanded PRN    Time 3   Period Weeks   Status New           PT Long Term Goals - 10/06/15 1750    PT LONG TERM GOAL #1   Title Patient to demonstrate strength 5/5 in all tested muscle groups in order to reduce pain, enhance regional stability, and improve overall mechanics   Time 6   Period Weeks   Status New   PT LONG TERM GOAL #2   Title Patient to be able to ambulate 1363ft during 6MWT in order to demosntrate readiness to return to safe community based activities and mobility    Time 6   Period Weeks   Status New   PT LONG TERM GOAL #3   Title Patient to be able to perform reciprocal stair ascent and descent with no unsteadiness, good eccentric control, and pain L knee no more than 1/10 in order to enhance independence and safety with community moblity    Time 6   Period Weeks   Status New   PT LONG TERM GOAL #4   Title Patient to report he has been able to complete all yardwork around his home, and has been able to return to light golfing activities with L knee pain no more than 1/10 in order to assist in returning to PLOF and improving QOL    Time 6   Period Weeks   Status New               Plan - 10/20/15 [id number]    Clinical Impression Statement Patient arrives today reporting that he felt more sore than usual over this past weekend, so avoided joint mobilizations today to reduce  irritation to tissues. Continued to focus on mobility  and ROM based exercises today, beginning with extension and progressing to and spending more time on flexion this session as this does continue to be more limited. Finished session with  standing pre-gait exercise adn introduced retrograde massage to L knee today. Did examine knee due to complaints of acute pain, and did not see any acute edema, temperature change, or inflammation during session today, suspect pain may be due to joint tendeness.     Rehab Potential Good   PT Frequency Other (comment)   PT Duration 6 weeks   PT Treatment/Interventions ADLs/Self Care Home Management;Cryotherapy;Gait training;Stair training;Functional mobility training;Therapeutic activities;Therapeutic exercise;Balance training;Neuromuscular re-education;Patient/family education;Manual techniques;Scar mobilization;Passive range of motion;Taping   PT Next Visit Plan Continue to focus on knee mobility first, gait training as well and edema control. As mobility improves, change focus to strength. Manual PRN. Teach scar massage.    PT Home Exercise Plan given, expand PRN    Consulted and Agree with Plan of Care Patient      Patient will benefit from skilled therapeutic intervention in order to improve the following deficits and impairments:  Abnormal gait, Hypomobility, Decreased skin integrity, Decreased scar mobility, Increased edema, Decreased strength, Pain, Difficulty walking, Decreased mobility, Increased muscle spasms, Improper body mechanics, Decreased range of motion, Impaired flexibility  Visit Diagnosis: Stiffness of left knee, not elsewhere classified  Localized edema  Muscle weakness (generalized)  Difficulty in walking, not elsewhere classified  Pain in left knee     Problem List Patient Active Problem List   Diagnosis Date Noted  . Nocturnal leg cramps 09/26/2014  . Foot drop, right 09/24/2014  . 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 Upper airway cough syndrome 09/10/2014  . Aortic valve disorder 11/19/2013  . Essential hypertension 11/19/2013  . Esophageal reflux 11/19/2013  . Gout 11/19/2013  . Aortic root enlargement ([place]) 11/19/2013    Deniece Ree PT, DPT [place] [address] [place], [place], [address] Phone: [phone]   Fax:  [phone]  Name: Marvin Munoz MRN: [id number] Date of Birth: March 13, 1961

## 2015-10-22 ENCOUNTER — Ambulatory Visit (HOSPITAL_COMMUNITY): Payer: 59 | Admitting: Physical Therapy

## 2015-10-22 DIAGNOSIS — R600 Localized edema: Secondary | ICD-10-CM

## 2015-10-22 DIAGNOSIS — M6281 Muscle weakness (generalized): Secondary | ICD-10-CM

## 2015-10-22 DIAGNOSIS — M25662 Stiffness of left knee, not elsewhere classified: Secondary | ICD-10-CM | POA: Diagnosis not present

## 2015-10-22 DIAGNOSIS — R262 Difficulty in walking, not elsewhere classified: Secondary | ICD-10-CM

## 2015-10-22 DIAGNOSIS — M25562 Pain in left knee: Secondary | ICD-10-CM

## 2015-10-22 NOTE — Therapy (Signed)
[facility] [address] [place], [place], [address] Phone: [phone]   Fax:  [phone]  Physical Therapy Treatment  Patient Details  Name: Marvin Munoz MRN: [id number] Date of Birth: [birth year] Referring Provider: Marchia Bond   Encounter Date: 10/22/2015      PT End of Session - 10/22/15 0859    Visit Number 6   Number of Visits 18   Date for PT Re-Evaluation 10/27/15   Authorization Type UHC    Authorization Time Period 10/06/15 to 11/17/15   PT Start Time 0816   PT Stop Time 0857   PT Time Calculation (min) 41 min   Activity Tolerance Patient tolerated treatment well   Behavior During Therapy [facility] for tasks assessed/performed      Past Medical History  Diagnosis Date  . Aortic insufficiency due to bicuspid aortic valve   . HTN (hypertension)   . Gastroesophageal reflux   . PVC's (premature ventricular contractions)     hx of benign  . Fever blister   . Migraine headache   . Bursitis of elbow     gouty olecranon, right elbow  . Guillain-Barre syndrome ([place])   . Allergic rhinitis   . BPH (benign prostatic hyperplasia)   . Eczema     forearm  . Restless legs syndrome (RLS)   . Foot drop, right 09/24/2014  . 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 Guillain Barr syndrome ([place]) 1970's  . Guillain Barr syndrome ([place])   . Nocturnal leg cramps 09/26/2014    Past Surgical History  Procedure Laterality Date  . Breast lumpectomy Right   . Tonsillectomy    . Cardiac catheterization    . Colonoscopy      There were no vitals filed for this visit.      Subjective Assessment - 10/22/15 0817    Subjective Patient reports that he feels better today, reports that his MD is very happy with how he's doing right now    Pertinent History HTN, gout, foot drop R, history of GBS, history of cardiac cath     Currently in Pain? Yes   Pain Score 3    Pain Location Knee   Pain Orientation Left   Pain Descriptors /  Indicators Aching   Pain Type Acute pain   Pain Radiating Towards none    Pain Onset More than a month ago   Pain Frequency Constant   Aggravating Factors  walking, standing, standing still, getting up after sitting for awhile    Pain Relieving Factors moving around, exercise, ice    Effect of Pain on Daily Activities can't do yardwork, or walk for long periods, still out of work             [facility] PT Assessment - 10/22/15 0001    AROM   Left Knee Extension -1   Left Knee Flexion 104                     [facility] Adult PT Treatment/Exercise - 10/22/15 0001    Knee/Hip Exercises: Stretches   Active Hamstring Stretch Both;3 reps;30 seconds   Active Hamstring Stretch Limitations 12 inch box    Quad Stretch Both;3 reps;30 seconds   Quad Stretch Limitations prone    Knee: Self-Stretch to increase Flexion Left   Knee: Self-Stretch Limitations 15 reps, 10 second hold each    Piriformis Stretch --   Piriformis Stretch Limitations --   Press [employer] Both;3  reps;30 seconds   Gastroc Stretch Limitations slantboard    Knee/Hip Exercises: Aerobic   Stationary Bike full revolutions seat 16 for 8 minutes   unsupervised, not included in billing    Knee/Hip Exercises: Standing   Rocker Board 2 minutes   Rocker Board Limitations AP and alteral, no HHA    Knee/Hip Exercises: Seated   Other Seated Knee/Hip Exercises AAROM knee flexion 1x20 with 5 second holds    Knee/Hip Exercises: Supine   Short Arc Quad Sets Left;1 set;20 reps   Short Arc Quad Sets Limitations 20 second holds    Straight Leg Raises Left;1 set;20 reps   Other Supine Knee/Hip Exercises supine hip ABD 1x10 with red TB              Balance Exercises - 10/22/15 0845    Balance Exercises: Standing   Tandem Stance Eyes open;3 reps;20 secs;Foam/compliant surface   SLS Eyes open;3 reps;10 secs;Foam/compliant surface           PT Education - 10/22/15 0859    Education provided No          PT Short  Term Goals - 10/06/15 1748    PT SHORT TERM GOAL #1   Title Patient to demonstrate L knee ROM of 0-115 degrees in order to improve gait and overall mobility, reduce pain    Time 3   Period Weeks   Status New   PT SHORT TERM GOAL #2   Title Patient to demosntrate improved gait mechanics, including consistent heel-toe, no hip hiking or unsteadiness, equal step/stance each side, and improved L knee mobility during gait in order to enhance efficiency of gait over both even and unven surfaces with L knee pain no more than 1/10    Time 3   Period Weeks   Status New   PT SHORT TERM GOAL #3   Title Patient to be independent in correctly applying ice, performing retrograde massage, and performing appropraite exercises to manage edema and pain in order to increase efficiency in managing condition    Time 3   Period Weeks   Status New   PT SHORT TERM GOAL #4   Title Patient to be independent in correctly and consistently performing appropriate HEP, to be expanded PRN    Time 3   Period Weeks   Status New           PT Long Term Goals - 10/06/15 1750    PT LONG TERM GOAL #1   Title Patient to demonstrate strength 5/5 in all tested muscle groups in order to reduce pain, enhance regional stability, and improve overall mechanics   Time 6   Period Weeks   Status New   PT LONG TERM GOAL #2   Title Patient to be able to ambulate 1360ft during 6MWT in order to demosntrate readiness to return to safe community based activities and mobility    Time 6   Period Weeks   Status New   PT LONG TERM GOAL #3   Title Patient to be able to perform reciprocal stair ascent and descent with no unsteadiness, good eccentric control, and pain L knee no more than 1/10 in order to enhance independence and safety with community moblity    Time 6   Period Weeks   Status New   PT LONG TERM GOAL #4   Title Patient to report he has been able to complete all yardwork around his home, and has been able to return to  light  golfing activities with L knee pain no more than 1/10 in order to assist in returning to PLOF and improving QOL    Time 6   Period Weeks   Status New               Plan - 10/22/15 0837    Clinical Impression Statement Patient arrives today stating that his MD is happy with his progress and that MD told him that he will liikely still have pain for some time. Measured AROM -1 to 104 today, with patient being pleased with these measures; educated that ideal flexion will be 115-120 degrees. Focused on quad activation and on flexion based ROM exercises today, as well as pregait and balance tasks in standing. Finished on bike, unsupervised  and not included in billing, for continued work on flexion ROM. Patient doing very well with skilled PT services at this time.    Rehab Potential Good   PT Frequency Other (comment)   PT Duration 6 weeks   PT Treatment/Interventions ADLs/Self Care Home Management;Cryotherapy;Gait training;Stair training;Functional mobility training;Therapeutic activities;Therapeutic exercise;Balance training;Neuromuscular re-education;Patient/family education;Manual techniques;Scar mobilization;Passive range of motion;Taping   PT Next Visit Plan Focus on knee flexion ROM, as this continues to improve begin strengthening. Manual PRN. Pregait/gait/balance.    Consulted and Agree with Plan of Care Patient      Patient will benefit from skilled therapeutic intervention in order to improve the following deficits and impairments:  Abnormal gait, Hypomobility, Decreased skin integrity, Decreased scar mobility, Increased edema, Decreased strength, Pain, Difficulty walking, Decreased mobility, Increased muscle spasms, Improper body mechanics, Decreased range of motion, Impaired flexibility  Visit Diagnosis: Stiffness of left knee, not elsewhere classified  Localized edema  Muscle weakness (generalized)  Difficulty in walking, not elsewhere classified  Pain in left  knee     Problem List Patient Active Problem List   Diagnosis Date Noted  . Nocturnal leg cramps 09/26/2014  . Foot drop, right 09/24/2014  . 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 Upper airway cough syndrome 09/10/2014  . Aortic valve disorder 11/19/2013  . Essential hypertension 11/19/2013  . Esophageal reflux 11/19/2013  . Gout 11/19/2013  . Aortic root enlargement ([place]) 11/19/2013    Deniece Ree PT, DPT [place] [address] [place], [place], [address] Phone: [phone]   Fax:  [phone]  Name: Marvin Munoz MRN: [id number] Date of Birth: 03/29/1961

## 2015-10-27 ENCOUNTER — Ambulatory Visit (HOSPITAL_COMMUNITY): Payer: 59 | Admitting: Physical Therapy

## 2015-10-27 DIAGNOSIS — M25662 Stiffness of left knee, not elsewhere classified: Secondary | ICD-10-CM

## 2015-10-27 DIAGNOSIS — R262 Difficulty in walking, not elsewhere classified: Secondary | ICD-10-CM

## 2015-10-27 DIAGNOSIS — M6281 Muscle weakness (generalized): Secondary | ICD-10-CM

## 2015-10-27 DIAGNOSIS — M25562 Pain in left knee: Secondary | ICD-10-CM

## 2015-10-27 DIAGNOSIS — R600 Localized edema: Secondary | ICD-10-CM

## 2015-10-27 NOTE — Therapy (Signed)
[facility] [address] [place], [place], [address] Phone: [phone]   Fax:  [phone]  Physical Therapy Treatment (Re-Assessment)  Patient Details  Name: Marvin Munoz MRN: [id number] Date of Birth: Dec 10, 1960 Referring Provider: Marchia Bond   Encounter Date: 10/27/2015      PT End of Session - 10/27/15 1032    Visit Number 7   Number of Visits 18   Date for PT Re-Evaluation 11/17/15   Authorization Type UHC    Authorization Time Period 10/06/15 to 11/17/15   PT Start Time 0942   PT Stop Time 1028   PT Time Calculation (min) 46 min   Activity Tolerance Patient tolerated treatment well   Behavior During Therapy [facility] for tasks assessed/performed      Past Medical History  Diagnosis Date  . Aortic insufficiency due to bicuspid aortic valve   . HTN (hypertension)   . Gastroesophageal reflux   . PVC's (premature ventricular contractions)     hx of benign  . Fever blister   . Migraine headache   . Bursitis of elbow     gouty olecranon, right elbow  . Guillain-Barre syndrome ([place])   . Allergic rhinitis   . BPH (benign prostatic hyperplasia)   . Eczema     forearm  . Restless legs syndrome (RLS)   . Foot drop, right 09/24/2014  . 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 Guillain Barr syndrome ([place]) 1970's  . Guillain Barr syndrome ([place])   . Nocturnal leg cramps 09/26/2014    Past Surgical History  Procedure Laterality Date  . Breast lumpectomy Right   . Tonsillectomy    . Cardiac catheterization    . Colonoscopy      There were no vitals filed for this visit.      Subjective Assessment - 10/27/15 0943    Subjective Patient reports that he is seeing definite improvement, hwoever continues to complain of stiffness and aching. He states that walking has gotten easier, he is also sleeping better, getting into and out of car is easier. He still has a hard time with aching and stiffness of the joint as a  whiole, especialy with flexion; if he is on it too long he can also tell as it will start aching more. No falls or close calls.    Pertinent History HTN, gout, foot drop R, history of GBS, history of cardiac cath     How long can you sit comfortably? 5/9- 30 minutes    How long can you stand comfortably? 5/9- has not tried pushing his time with this yet    How long can you walk comfortably? 5/9- 30-40 minutes at self selected pace    Patient Stated Goals get knee back to where it was, loosen it up a bit, get out to do yardwork and golf    Currently in Pain? Yes   Pain Score 3    Pain Location Knee   Pain Orientation Left   Pain Descriptors / Indicators Aching   Pain Type Surgical pain   Pain Radiating Towards none    Pain Onset More than a month ago   Pain Frequency Constant   Aggravating Factors  staying still for too long, being up on feet for too long    Pain Relieving Factors movement, ice    Effect of Pain on Daily Activities still not back at work, still limited with yardwork  [facility] PT Assessment - 10/27/15 0001    Observation/Other Assessments   Focus on Therapeutic Outcomes (FOTO)  34% limited    AROM   Left Knee Extension -1   Left Knee Flexion 108   Strength   Right Hip Flexion 4+/5   Right Hip Extension 3+/5   Right Hip ABduction 4+/5   Left Hip Flexion 4+/5   Left Hip Extension 3+/5   Left Hip ABduction 4+/5   Right Knee Flexion 4/5   Right Knee Extension 5/5   Left Knee Flexion 3+/5   Left Knee Extension 4+/5   Right Ankle Dorsiflexion 5/5   Left Ankle Dorsiflexion 5/5   Ambulation/Gait   Gait Comments continues to hip hike L however this is improved    6 minute walk test results    Aerobic Endurance Distance Walked 1300   Endurance additional comments 6MWT    Timed Up and Go Test   Normal TUG (seconds) 8.18                     [facility] Adult PT Treatment/Exercise - 10/27/15 0001    Knee/Hip Exercises: Stretches   Active  Hamstring Stretch Both;3 reps;30 seconds   Active Hamstring Stretch Limitations stairs    Knee: Self-Stretch to increase Flexion Left   Knee: Self-Stretch Limitations 15 reps, 10 second hold each    Knee/Hip Exercises: Aerobic   Stationary Bike full revolutaionts seat 12, 8 minutes   not included in billing    Knee/Hip Exercises: Standing   Gait Training gait training approximately 284f with cues to eliminate hip hiking; stair training on 4 inch steps x2    Knee/Hip Exercises: Seated   Other Seated Knee/Hip Exercises AAROM knee flexion 1x20 with 5 second holds                 PT Education - 10/27/15 1031    Education provided Yes   Education Details progress with skilled PT services, plan of care moving forward    Person(s) Educated Patient   Methods Explanation   Comprehension Verbalized understanding          PT Short Term Goals - 10/27/15 1005    PT SHORT TERM GOAL #1   Title Patient to demonstrate L knee ROM of 0-115 degrees in order to improve gait and overall mobility, reduce pain    Baseline 5/9- -1 to 108   Time 3   Period Weeks   Status Partially Met   PT SHORT TERM GOAL #2   Title Patient to demosntrate improved gait mechanics, including consistent heel-toe, no hip hiking or unsteadiness, equal step/stance each side, and improved L knee mobility during gait in order to enhance efficiency of gait over both even and unven surfaces with L knee pain no more than 1/10    Baseline 5/9- still hip hiking and still a little unsteady on corners and uneven surfaces    Time 3   Period Weeks   Status Partially Met   PT SHORT TERM GOAL #3   Title Patient to be independent in correctly applying ice, performing retrograde massage, and performing appropraite exercises to manage edema and pain in order to increase efficiency in managing condition    Baseline 5/9- has been adhering to program/PT recommendations    Time 3   Period Weeks   Status Achieved   PT SHORT TERM  GOAL #4   Title Patient to be independent in correctly and consistently performing appropriate HEP, to be expanded  PRN    Time 3   Period Weeks   Status Achieved           PT Long Term Goals - 10/27/15 1007    PT LONG TERM GOAL #1   Title Patient to demonstrate strength 5/5 in all tested muscle groups in order to reduce pain, enhance regional stability, and improve overall mechanics   Baseline 5/9- improving but goal as of yet unmet    Time 6   Period Weeks   Status On-going   PT LONG TERM GOAL #2   Title Patient to be able to ambulate 1345f during 6MWT in order to demosntrate readiness to return to safe community based activities and mobility    Baseline 5/9- 13054fduring 6MWT    Time 6   Period Weeks   Status Achieved   PT LONG TERM GOAL #3   Title Patient to be able to perform reciprocal stair ascent and descent with no unsteadiness, good eccentric control, and pain L knee no more than 1/10 in order to enhance independence and safety with community moblity    Baseline 5/9- poor eccentric control, difficulty with descent    Time 6   Period Weeks   Status On-going   PT LONG TERM GOAL #4   Title Patient to report he has been able to complete all yardwork around his home, and has been able to return to light golfing activities with L knee pain no more than 1/10 in order to assist in returning to PLOF and improving QOL    Baseline 5/9- has not tried yet    Time 6   Period Weeks   Status On-going               Plan - 10/27/15 1012    Clinical Impression Statement Re-assessment performed today. Patient donig very well overall with improvements in gait, ROM, strength, and overall function; however he does continue to experience knee stiffness especially with flexion, some functional muscle weakness, mild gait impairment, difficulty with stairs, and has not been able to return to work as of yet. At this point, however, patient is doing very well with skilled PT servies and  appears to be on an appropriate timeline in terms of his overall recovery. At this point continue to recommend ongoing skilled PT services in order to address functional limitaitons and to assist in reaching optimal level of function.    Rehab Potential Good   PT Frequency Other (comment)  2-3 times per week    PT Duration 3 weeks   PT Treatment/Interventions ADLs/Self Care Home Management;Cryotherapy;Gait training;Stair training;Functional mobility training;Therapeutic activities;Therapeutic exercise;Balance training;Neuromuscular re-education;Patient/family education;Manual techniques;Scar mobilization;Passive range of motion;Taping   PT Next Visit Plan Focus on knee flexion ROM, as this continues to improve begin strengthening. Manual PRN. Pregait/gait/balance. Stair training.    PT Home Exercise Plan given, expand PRN    Consulted and Agree with Plan of Care Patient      Patient will benefit from skilled therapeutic intervention in order to improve the following deficits and impairments:  Abnormal gait, Hypomobility, Decreased skin integrity, Decreased scar mobility, Increased edema, Decreased strength, Pain, Difficulty walking, Decreased mobility, Increased muscle spasms, Improper body mechanics, Decreased range of motion, Impaired flexibility  Visit Diagnosis: Stiffness of left knee, not elsewhere classified  Localized edema  Muscle weakness (generalized)  Difficulty in walking, not elsewhere classified  Pain in left knee     Problem List Patient Active Problem List   Diagnosis Date Noted  .  Nocturnal leg cramps 09/26/2014  . Foot drop, right 09/24/2014  . 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 Upper airway cough syndrome 09/10/2014  . Aortic valve disorder 11/19/2013  . Essential hypertension 11/19/2013  . Esophageal reflux 11/19/2013  . Gout 11/19/2013  . Aortic root enlargement ([place]) 11/19/2013    Deniece Ree PT, DPT [facility] [address] [place], [place], [address] Phone: [phone]   Fax:  [phone]  Name: Marvin Munoz MRN: [id number] Date of Birth: 03-23-1961

## 2015-10-29 ENCOUNTER — Ambulatory Visit (HOSPITAL_COMMUNITY): Payer: 59 | Admitting: Physical Therapy

## 2015-10-29 DIAGNOSIS — R262 Difficulty in walking, not elsewhere classified: Secondary | ICD-10-CM

## 2015-10-29 DIAGNOSIS — R600 Localized edema: Secondary | ICD-10-CM

## 2015-10-29 DIAGNOSIS — M6281 Muscle weakness (generalized): Secondary | ICD-10-CM

## 2015-10-29 DIAGNOSIS — M25662 Stiffness of left knee, not elsewhere classified: Secondary | ICD-10-CM

## 2015-10-29 DIAGNOSIS — M25562 Pain in left knee: Secondary | ICD-10-CM

## 2015-10-29 NOTE — Therapy (Signed)
[facility] [address] [place], [place], [address] Phone: [phone]   Fax:  [phone]  Physical Therapy Treatment  Patient Details  Name: Marvin Munoz MRN: [id number] Date of Birth: [birth year] Referring Provider: Marchia Bond   Encounter Date: 10/29/2015      PT End of Session - 10/29/15 0856    Visit Number 8   Number of Visits 18   Date for PT Re-Evaluation 11/17/15   Authorization Type UHC    Authorization Time Period 10/06/15 to 11/17/15   PT Start Time 0816   PT Stop Time 0856   PT Time Calculation (min) 40 min   Activity Tolerance Patient tolerated treatment well   Behavior During Therapy [facility] for tasks assessed/performed      Past Medical History  Diagnosis Date  . Aortic insufficiency due to bicuspid aortic valve   . HTN (hypertension)   . Gastroesophageal reflux   . PVC's (premature ventricular contractions)     hx of benign  . Fever blister   . Migraine headache   . Bursitis of elbow     gouty olecranon, right elbow  . Guillain-Barre syndrome ([place])   . Allergic rhinitis   . BPH (benign prostatic hyperplasia)   . Eczema     forearm  . Restless legs syndrome (RLS)   . Foot drop, right 09/24/2014  . 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 Guillain Barr syndrome ([place]) 1970's  . Guillain Barr syndrome ([place])   . Nocturnal leg cramps 09/26/2014    Past Surgical History  Procedure Laterality Date  . Breast lumpectomy Right   . Tonsillectomy    . Cardiac catheterization    . Colonoscopy      There were no vitals filed for this visit.      Subjective Assessment - 10/29/15 0818    Subjective Patient reports that he is doing well today, but still having some nagging soreness in his L knee, around 3/10    Pertinent History HTN, gout, foot drop R, history of GBS, history of cardiac cath     Currently in Pain? Yes   Pain Score 3    Pain Location Knee   Pain Orientation Left                           [facility] Adult PT Treatment/Exercise - 10/29/15 0001    Ambulation/Gait   Stairs Yes   Number of Stairs 7   Height of Stairs 4   Gait Comments continued working on reciprocal stair cliimbing technique on lower height stairs, with focus on eccentric descent and step over step pattern  continues to demonstrate eccentric weakness L on descent    Knee/Hip Exercises: Stretches   Active Hamstring Stretch Both;3 reps;30 seconds   Active Hamstring Stretch Limitations stairs    Quad Stretch Both;3 reps;30 seconds   Quad Stretch Limitations prone    Knee: Self-Stretch to increase Flexion Left   Knee: Self-Stretch Limitations 15 reps, 10 second hold each    Gastroc Stretch Both;3 reps;30 seconds   Gastroc Stretch Limitations slantboard    Knee/Hip Exercises: Aerobic   Stationary Bike full revolutaionts seat 12, 8 minutes   unsupervised, not included in billing    Knee/Hip Exercises: Standing   Rocker Board 2 minutes   Rocker Board Limitations AP and alteral, no HHA    Knee/Hip Exercises: Seated   Other Seated Knee/Hip Exercises AAROM knee flexion  1x20 with 5 second holds    Knee/Hip Exercises: Supine   Straight Leg Raises Both;1 set;10 reps   Straight Leg Raises Limitations 2#    Other Supine Knee/Hip Exercises supine hip ABD 1x15 with red TB    Knee/Hip Exercises: Sidelying   Hip ABduction Both;1 set;10 reps   Hip ABduction Limitations 2#   Knee/Hip Exercises: Prone   Hip Extension Both;10 reps   Hip Extension Limitations 2#              Balance Exercises - 10/29/15 0847    Balance Exercises: Standing   Tandem Stance Eyes closed;Foam/compliant surface;3 reps;15 secs   SLS Foam/compliant surface;3 reps;10 secs;Eyes open           PT Education - 10/29/15 0856    Education provided No          PT Short Term Goals - 10/27/15 1005    PT SHORT TERM GOAL #1   Title Patient to demonstrate L knee ROM of 0-115 degrees in order to improve  gait and overall mobility, reduce pain    Baseline 5/9- -1 to 108   Time 3   Period Weeks   Status Partially Met   PT SHORT TERM GOAL #2   Title Patient to demosntrate improved gait mechanics, including consistent heel-toe, no hip hiking or unsteadiness, equal step/stance each side, and improved L knee mobility during gait in order to enhance efficiency of gait over both even and unven surfaces with L knee pain no more than 1/10    Baseline 5/9- still hip hiking and still a little unsteady on corners and uneven surfaces    Time 3   Period Weeks   Status Partially Met   PT SHORT TERM GOAL #3   Title Patient to be independent in correctly applying ice, performing retrograde massage, and performing appropraite exercises to manage edema and pain in order to increase efficiency in managing condition    Baseline 5/9- has been adhering to program/PT recommendations    Time 3   Period Weeks   Status Achieved   PT SHORT TERM GOAL #4   Title Patient to be independent in correctly and consistently performing appropriate HEP, to be expanded PRN    Time 3   Period Weeks   Status Achieved           PT Long Term Goals - 10/27/15 1007    PT LONG TERM GOAL #1   Title Patient to demonstrate strength 5/5 in all tested muscle groups in order to reduce pain, enhance regional stability, and improve overall mechanics   Baseline 5/9- improving but goal as of yet unmet    Time 6   Period Weeks   Status On-going   PT LONG TERM GOAL #2   Title Patient to be able to ambulate 1363f during 6MWT in order to demosntrate readiness to return to safe community based activities and mobility    Baseline 5/9- 13015fduring 6MWT    Time 6   Period Weeks   Status Achieved   PT LONG TERM GOAL #3   Title Patient to be able to perform reciprocal stair ascent and descent with no unsteadiness, good eccentric control, and pain L knee no more than 1/10 in order to enhance independence and safety with community moblity     Baseline 5/9- poor eccentric control, difficulty with descent    Time 6   Period Weeks   Status On-going   PT LONG TERM GOAL #4  Title Patient to report he has been able to complete all yardwork around his home, and has been able to return to light golfing activities with L knee pain no more than 1/10 in order to assist in returning to PLOF and improving QOL    Baseline 5/9- has not tried yet    Time 6   Period Weeks   Status On-going               Plan - 10/29/15 0835    Clinical Impression Statement Patient arrives today reporting he is feeling well; focused on functional stretching, primarily flexion ROM L Knee, and began to introduced proximal strengthening today as well. Also performed functional pre-gait, stairs, and balance exercises this session with good performance and tolerance by patient, however continue to note eccentric weaknes L quad and difficulty with stair descent at this time. Ended session with unsupervised exercise on bike to assist in improving L knee ROM.    Rehab Potential Good   PT Frequency Other (comment)   PT Duration 3 weeks   PT Treatment/Interventions ADLs/Self Care Home Management;Cryotherapy;Gait training;Stair training;Functional mobility training;Therapeutic activities;Therapeutic exercise;Balance training;Neuromuscular re-education;Patient/family education;Manual techniques;Scar mobilization;Passive range of motion;Taping   PT Next Visit Plan Focus on knee flexion ROM, as this continues to improve begin strengthening. Manual PRN. Pregait/gait/balance. Stair training.    PT Home Exercise Plan given, expand PRN    Consulted and Agree with Plan of Care Patient      Patient will benefit from skilled therapeutic intervention in order to improve the following deficits and impairments:  Abnormal gait, Hypomobility, Decreased skin integrity, Decreased scar mobility, Increased edema, Decreased strength, Pain, Difficulty walking, Decreased mobility,  Increased muscle spasms, Improper body mechanics, Decreased range of motion, Impaired flexibility  Visit Diagnosis: Stiffness of left knee, not elsewhere classified  Localized edema  Muscle weakness (generalized)  Difficulty in walking, not elsewhere classified  Pain in left knee     Problem List Patient Active Problem List   Diagnosis Date Noted  . Nocturnal leg cramps 09/26/2014  . Foot drop, right 09/24/2014  . 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 Upper airway cough syndrome 09/10/2014  . Aortic valve disorder 11/19/2013  . Essential hypertension 11/19/2013  . Esophageal reflux 11/19/2013  . Gout 11/19/2013  . Aortic root enlargement ([place]) 11/19/2013    Deniece Ree PT, DPT [facility] [address] [place], [place], [address] Phone: [phone]   Fax:  [phone]  Name: JAXSUN CIAMPI MRN: [id number] Date of Birth: 08/16/1960

## 2015-11-03 ENCOUNTER — Ambulatory Visit (HOSPITAL_COMMUNITY): Payer: 59

## 2015-11-03 DIAGNOSIS — M6281 Muscle weakness (generalized): Secondary | ICD-10-CM

## 2015-11-03 DIAGNOSIS — R262 Difficulty in walking, not elsewhere classified: Secondary | ICD-10-CM

## 2015-11-03 DIAGNOSIS — M25662 Stiffness of left knee, not elsewhere classified: Secondary | ICD-10-CM | POA: Diagnosis not present

## 2015-11-03 DIAGNOSIS — R600 Localized edema: Secondary | ICD-10-CM

## 2015-11-03 DIAGNOSIS — M25562 Pain in left knee: Secondary | ICD-10-CM

## 2015-11-03 NOTE — Patient Instructions (Signed)
FUNCTIONAL MOBILITY: Wall Squat    Stance: shoulder-width on floor, against wall. Place feet in front of hips. Bend hips and knees. Keep back straight. Do not allow knees to bend past toes. Squeeze glutes and quads to stand. 10 reps per set, 1-2 sets per day, 3-5 days per week  Copyright  VHI. All rights reserved.   Instructed patella mobs R/L and S/I

## 2015-11-03 NOTE — Therapy (Signed)
[facility] [address] [place], [place], [address] Phone: [phone]   Fax:  [phone]  Physical Therapy Treatment  Patient Details  Name: Marvin Munoz MRN: [id number] Date of Birth: Jun 01, 1961 Referring Provider: Marchia Bond   Encounter Date: 11/03/2015      PT End of Session - 11/03/15 0824    Visit Number 9   Number of Visits 18   Date for PT Re-Evaluation 11/17/15   Authorization Type UHC    Authorization Time Period 10/06/15 to 11/17/15   PT Start Time 0814   PT Stop Time 0908   PT Time Calculation (min) 54 min   Activity Tolerance Patient tolerated treatment well   Behavior During Therapy [name] for tasks assessed/performed      Past Medical History  Diagnosis Date  . Aortic insufficiency due to bicuspid aortic valve   . HTN (hypertension)   . Gastroesophageal reflux   . PVC's (premature ventricular contractions)     hx of benign  . Fever blister   . Migraine headache   . Bursitis of elbow     gouty olecranon, right elbow  . Guillain-Barre syndrome ([name])   . Allergic rhinitis   . BPH (benign prostatic hyperplasia)   . Eczema     forearm  . Restless legs syndrome (RLS)   . Foot drop, right 09/24/2014  . 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 Guillain Barr syndrome ([name]) 1970's  . Guillain Barr syndrome ([name])   . Nocturnal leg cramps 09/26/2014    Past Surgical History  Procedure Laterality Date  . Breast lumpectomy Right   . Tonsillectomy    . Cardiac catheterization    . Colonoscopy      There were no vitals filed for this visit.      Subjective Assessment - 11/03/15 0821    Subjective Pt reports he felt knee "pop" and has new grinding feeling, went to MD yesterday about new pain.  Reports knee looked okay to MD.  Current pain scale 5/10   Pertinent History HTN, gout, foot drop R, history of GBS, history of cardiac cath     Patient Stated Goals get knee back to where it was, loosen it  up a bit, get out to do yardwork and golf    Currently in Pain? Yes   Pain Score 5    Pain Location Knee   Pain Orientation Left   Pain Descriptors / Indicators Aching   Pain Type Surgical pain   Pain Radiating Towards none   Pain Onset More than a month ago   Pain Frequency Constant   Aggravating Factors  staying still for too long, being up on feet for too long   Pain Relieving Factors movement, ice   Effect of Pain on Daily Activities still not back at work, still limited with [name] Adult PT Treatment/Exercise - 11/03/15 0001    Ambulation/Gait   Gait Pattern Step-through pattern   Stairs Yes   Number of Stairs 7   Height of Stairs --  ascend 7 decend 4 in   Gait Comments continued working on reciprocal stair cliimbing technique on lower height stairs, with focus on eccentric descent and step over step pattern   Knee/Hip Exercises: [employer] operational officer Both;3 reps;30 seconds   Active Hamstring Stretch Limitations 12in step   Quad Stretch Both;3 reps;30 seconds   Quad Stretch Limitations  prone    Knee: Self-Stretch to increase Flexion Left   Knee: Self-Stretch Limitations 15 reps, 10 second hold each    Gastroc Stretch Both;3 reps;30 seconds   Gastroc Stretch Limitations slantboard    Knee/Hip Exercises: Aerobic   Stationary Bike full revolutaionts seat 12, 8 minutes   unsupervised at [facility], no charge   Knee/Hip Exercises: Standing   Lateral Step Up Left;10 reps;Hand Hold: 2;Step Height: 4"   Forward Step Up Left;10 reps;Hand Hold: 0;Step Height: 6"   Step Down Left;10 reps;Hand Hold: 1;Step Height: 4"   Wall Squat 10 reps   SLS 60" on airex   SLS with Vectors 3x 5" on airex   Knee/Hip Exercises: Supine   Heel Slides 1 set   Heel Slides Limitations 1-118 degrees   Patellar Mobs instructed all directions   Knee/Hip Exercises: Prone   Hip Extension Both;15 reps   Hip Extension Limitations 2#    Manual Therapy   Manual Therapy Joint  mobilization   Manual therapy comments completed separately from all other therex and interventions   Joint Mobilization patella mobs M/L and S/L                PT Education - 11/03/15 0940    Education provided Yes   Education Details Instructed patella mobs, added wall squats for strengthening and ROM   Person(s) Educated Patient   Methods Explanation;Demonstration;Handout;Tactile cues   Comprehension Verbalized understanding;Returned demonstration          PT Short Term Goals - 10/27/15 1005    PT SHORT TERM GOAL #1   Title Patient to demonstrate L knee ROM of 0-115 degrees in order to improve gait and overall mobility, reduce pain    Baseline 5/9- -1 to 108   Time 3   Period Weeks   Status Partially Met   PT SHORT TERM GOAL #2   Title Patient to demosntrate improved gait mechanics, including consistent heel-toe, no hip hiking or unsteadiness, equal step/stance each side, and improved L knee mobility during gait in order to enhance efficiency of gait over both even and unven surfaces with L knee pain no more than 1/10    Baseline 5/9- still hip hiking and still a little unsteady on corners and uneven surfaces    Time 3   Period Weeks   Status Partially Met   PT SHORT TERM GOAL #3   Title Patient to be independent in correctly applying ice, performing retrograde massage, and performing appropraite exercises to manage edema and pain in order to increase efficiency in managing condition    Baseline 5/9- has been adhering to program/PT recommendations    Time 3   Period Weeks   Status Achieved   PT SHORT TERM GOAL #4   Title Patient to be independent in correctly and consistently performing appropriate HEP, to be expanded PRN    Time 3   Period Weeks   Status Achieved           PT Long Term Goals - 10/27/15 1007    PT LONG TERM GOAL #1   Title Patient to demonstrate strength 5/5 in all tested muscle groups in order to reduce pain, enhance regional stability,  and improve overall mechanics   Baseline 5/9- improving but goal as of yet unmet    Time 6   Period Weeks   Status On-going   PT LONG TERM GOAL #2   Title Patient to be able to ambulate 1354f during 6MWT in order to  demosntrate readiness to return to safe community based activities and mobility    Baseline 5/9- 1356f during 6MWT    Time 6   Period Weeks   Status Achieved   PT LONG TERM GOAL #3   Title Patient to be able to perform reciprocal stair ascent and descent with no unsteadiness, good eccentric control, and pain L knee no more than 1/10 in order to enhance independence and safety with community moblity    Baseline 5/9- poor eccentric control, difficulty with descent    Time 6   Period Weeks   Status On-going   PT LONG TERM GOAL #4   Title Patient to report he has been able to complete all yardwork around his home, and has been able to return to light golfing activities with L knee pain no more than 1/10 in order to assist in returning to PLOF and improving QOL    Baseline 5/9- has not tried yet    Time 6   Period Weeks   Status On-going               Plan - 11/03/15 0929    Clinical Impression Statement Pt making great progress towards goals with improving AROM to 1-118 degrees today and reports of increased functional activities at home.  Pt stated he has new "popping or grinding" feelings, went to MD yesterday and reports knee looked good with xrays.  Pt educated on quadriceps and patella locations and instructed patella mobs to improve patella mobility for pain control and to reduce popping.  Reviewed HEP completeing at home with reports of good compliance daily.  Progressed closed chain exercises for functional strengthening and ROM with good form noted.  Added wall squats to HEP for gluteal strengthening and progressing knee flexion, pt able to  demonstrate appropriate form with new exercises.  Began stair training for functional strengthening with good form noted,  noted decreased eccentric control descending stairs, cueing to improve.  End of session no reports of increased pain, encouraged to apply ice at end of session for pain and edema control.     Rehab Potential Good   PT Frequency Other (comment)   PT Duration 3 weeks   PT Treatment/Interventions ADLs/Self Care Home Management;Cryotherapy;Gait training;Stair training;Functional mobility training;Therapeutic activities;Therapeutic exercise;Balance training;Neuromuscular re-education;Patient/family education;Manual techniques;Scar mobilization;Passive range of motion;Taping   PT Next Visit Plan Progress functional strengtheing due to improved ROM at 1-118,  Pregait activities/ gait and balance, stair training.  Begin lunges next session.   PT Home Exercise Plan Added patella mobs and wall squats      Patient will benefit from skilled therapeutic intervention in order to improve the following deficits and impairments:  Abnormal gait, Hypomobility, Decreased skin integrity, Decreased scar mobility, Increased edema, Decreased strength, Pain, Difficulty walking, Decreased mobility, Increased muscle spasms, Improper body mechanics, Decreased range of motion, Impaired flexibility  Visit Diagnosis: Stiffness of left knee, not elsewhere classified  Localized edema  Muscle weakness (generalized)  Difficulty in walking, not elsewhere classified  Pain in left knee     Problem List Patient Active Problem List   Diagnosis Date Noted  . Nocturnal leg cramps 09/26/2014  . Foot drop, right 09/24/2014  . 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 Upper airway cough syndrome 09/10/2014  . Aortic valve disorder 11/19/2013  . Essential hypertension 11/19/2013  . Esophageal reflux 11/19/2013  . Gout 11/19/2013  . Aortic root enlargement ([facility] 11/19/2013   [facility] [phone] CIhor Austin  Denice Paradise 11/03/2015, 9:46 AM  [facility] [address] [place], [place], [address] Phone: [phone]   Fax:  [phone]  Name: Marvin Munoz MRN: [id number] Date of Birth: 08/21/1960

## 2015-11-05 ENCOUNTER — Ambulatory Visit (HOSPITAL_COMMUNITY): Payer: 59 | Admitting: Physical Therapy

## 2015-11-05 DIAGNOSIS — M6281 Muscle weakness (generalized): Secondary | ICD-10-CM

## 2015-11-05 DIAGNOSIS — R600 Localized edema: Secondary | ICD-10-CM

## 2015-11-05 DIAGNOSIS — R262 Difficulty in walking, not elsewhere classified: Secondary | ICD-10-CM

## 2015-11-05 DIAGNOSIS — M25662 Stiffness of left knee, not elsewhere classified: Secondary | ICD-10-CM | POA: Diagnosis not present

## 2015-11-05 DIAGNOSIS — M25562 Pain in left knee: Secondary | ICD-10-CM

## 2015-11-05 NOTE — Therapy (Signed)
[facility] [address] [place], [place], [address] Phone: [phone]   Fax:  [phone]  Physical Therapy Treatment  Patient Details  Name: Marvin Munoz MRN: [id number] Date of Birth: September 25, 1960 Referring Provider: Marchia Bond   Encounter Date: 11/05/2015      PT End of Session - 11/05/15 0902    Visit Number 10   Number of Visits 18   Date for PT Re-Evaluation 11/17/15   Authorization Type UHC    Authorization Time Period 10/06/15 to 11/17/15   PT Start Time 0815   PT Stop Time 0857   PT Time Calculation (min) 42 min   Activity Tolerance Patient tolerated treatment well   Behavior During Therapy [facility] for tasks assessed/performed      Past Medical History  Diagnosis Date  . Aortic insufficiency due to bicuspid aortic valve   . HTN (hypertension)   . Gastroesophageal reflux   . PVC's (premature ventricular contractions)     hx of benign  . Fever blister   . Migraine headache   . Bursitis of elbow     gouty olecranon, right elbow  . Guillain-Barre syndrome ([place])   . Allergic rhinitis   . BPH (benign prostatic hyperplasia)   . Eczema     forearm  . Restless legs syndrome (RLS)   . Foot drop, right 09/24/2014  . 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 Guillain Barr syndrome ([place]) 1970's  . Guillain Barr syndrome ([place])   . Nocturnal leg cramps 09/26/2014    Past Surgical History  Procedure Laterality Date  . Breast lumpectomy Right   . Tonsillectomy    . Cardiac catheterization    . Colonoscopy      There were no vitals filed for this visit.      Subjective Assessment - 11/05/15 0816    Subjective Patient reports that he felt good after last session, but does report that he is having some grinding in his knee but MD thinks its OK, no major problems. Reports stairs are the hardest thing for him to do right now, as well as a little bit of reported difficulty with ROM.    Pertinent History HTN, gout,  foot drop R, history of GBS, history of cardiac cath     Currently in Pain? Yes   Pain Score 5    Pain Location Knee   Pain Orientation Left   Pain Descriptors / Indicators Aching                         [facility] Adult PT Treatment/Exercise - 11/05/15 0001    Knee/Hip Exercises: Stretches   Active Hamstring Stretch Both;3 reps;30 seconds   Active Hamstring Stretch Limitations 12in step   Quad Stretch Both;3 reps;30 seconds   Quad Stretch Limitations prone    Knee: Self-Stretch to increase Flexion Left   Knee: Self-Stretch Limitations 15 reps, 10 second hold each    Gastroc Stretch Both;3 reps;30 seconds   Gastroc Stretch Limitations slantboard    Knee/Hip Exercises: Aerobic   Stationary Bike full revolutaionts seat 12, 10 minutes   not included in billing, unsupervised    Knee/Hip Exercises: Standing   Heel Raises Both;1 set;15 reps   Heel Raises Limitations heel and toe    Forward Lunges Both;1 set;10 reps   Forward Lunges Limitations 4 inch step    Lateral Step Up Both;1 set;10 reps   Lateral Step Up Limitations 4  inch step    Forward Step Up Both;1 set;10 reps   Forward Step Up Limitations 4 inch step    Step Down Both;1 set;10 reps   Step Down Limitations 4 inch step    Rocker Board 2 minutes   Rocker Board Limitations AP and alteral, no HHA    Gait Training eccentric sit to stands 1x10  3 second holds    Manual Therapy   Manual Therapy Soft tissue mobilization   Manual therapy comments completed separately from all other therex and interventions   Soft tissue mobilization supine L quad                 PT Education - 11/05/15 0902    Education provided No          PT Short Term Goals - 10/27/15 1005    PT SHORT TERM GOAL #1   Title Patient to demonstrate L knee ROM of 0-115 degrees in order to improve gait and overall mobility, reduce pain    Baseline 5/9- -1 to 108   Time 3   Period Weeks   Status Partially Met   PT SHORT TERM GOAL  #2   Title Patient to demosntrate improved gait mechanics, including consistent heel-toe, no hip hiking or unsteadiness, equal step/stance each side, and improved L knee mobility during gait in order to enhance efficiency of gait over both even and unven surfaces with L knee pain no more than 1/10    Baseline 5/9- still hip hiking and still a little unsteady on corners and uneven surfaces    Time 3   Period Weeks   Status Partially Met   PT SHORT TERM GOAL #3   Title Patient to be independent in correctly applying ice, performing retrograde massage, and performing appropraite exercises to manage edema and pain in order to increase efficiency in managing condition    Baseline 5/9- has been adhering to program/PT recommendations    Time 3   Period Weeks   Status Achieved   PT SHORT TERM GOAL #4   Title Patient to be independent in correctly and consistently performing appropriate HEP, to be expanded PRN    Time 3   Period Weeks   Status Achieved           PT Long Term Goals - 10/27/15 1007    PT LONG TERM GOAL #1   Title Patient to demonstrate strength 5/5 in all tested muscle groups in order to reduce pain, enhance regional stability, and improve overall mechanics   Baseline 5/9- improving but goal as of yet unmet    Time 6   Period Weeks   Status On-going   PT LONG TERM GOAL #2   Title Patient to be able to ambulate 1365f during 6MWT in order to demosntrate readiness to return to safe community based activities and mobility    Baseline 5/9- 13069fduring 6MWT    Time 6   Period Weeks   Status Achieved   PT LONG TERM GOAL #3   Title Patient to be able to perform reciprocal stair ascent and descent with no unsteadiness, good eccentric control, and pain L knee no more than 1/10 in order to enhance independence and safety with community moblity    Baseline 5/9- poor eccentric control, difficulty with descent    Time 6   Period Weeks   Status On-going   PT LONG TERM GOAL #4    Title Patient to report he has been able to complete  all yardwork around his home, and has been able to return to light golfing activities with L knee pain no more than 1/10 in order to assist in returning to PLOF and improving QOL    Baseline 5/9- has not tried yet    Time 6   Period Weeks   Status On-going               Plan - 11/05/15 4473    Clinical Impression Statement Continued with functional stretching and progression of fucntional exercises today, with monitoring of patient's more recent L knee grinding/pain throughout session. patient able to perform all exercises well today with only occasional cues for form, good posture throughout exercises today. Patient is making excellent progress with skilled PT services overall; his new onset grinding/pain will continued to be moniotred  during/adjusted for during skilled PT sessions until it has resolved. Also performed  soft tissue mobilization to L quad today with moderate muscle tightness and knotting noted.    Rehab Potential Good   PT Frequency Other (comment)   PT Duration 3 weeks   PT Treatment/Interventions ADLs/Self Care Home Management;Cryotherapy;Gait training;Stair training;Functional mobility training;Therapeutic activities;Therapeutic exercise;Balance training;Neuromuscular re-education;Patient/family education;Manual techniques;Scar mobilization;Passive range of motion;Taping   PT Next Visit Plan Progress functional strengtheing due to improved ROM at 1-118,  Pregait activities/ gait and balance, stair training.  Begin lunges next session. Continue manual to L quad.    Consulted and Agree with Plan of Care Patient      Patient will benefit from skilled therapeutic intervention in order to improve the following deficits and impairments:  Abnormal gait, Hypomobility, Decreased skin integrity, Decreased scar mobility, Increased edema, Decreased strength, Pain, Difficulty walking, Decreased mobility, Increased muscle spasms,  Improper body mechanics, Decreased range of motion, Impaired flexibility  Visit Diagnosis: Stiffness of left knee, not elsewhere classified  Localized edema  Muscle weakness (generalized)  Difficulty in walking, not elsewhere classified  Pain in left knee     Problem List Patient Active Problem List   Diagnosis Date Noted  . Nocturnal leg cramps 09/26/2014  . Foot drop, right 09/24/2014  . 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 Upper airway cough syndrome 09/10/2014  . Aortic valve disorder 11/19/2013  . Essential hypertension 11/19/2013  . Esophageal reflux 11/19/2013  . Gout 11/19/2013  . Aortic root enlargement ([place]) 11/19/2013    Deniece Ree PT, DPT [facility] [address] [place], [place], [address] Phone: [phone]   Fax:  [phone]  Name: Marvin Munoz MRN: [id number] Date of Birth: [birth year]

## 2015-11-10 ENCOUNTER — Encounter (HOSPITAL_COMMUNITY): Payer: 59 | Admitting: Physical Therapy

## 2015-11-12 ENCOUNTER — Ambulatory Visit (HOSPITAL_COMMUNITY): Payer: 59 | Admitting: Physical Therapy

## 2015-11-12 DIAGNOSIS — M6281 Muscle weakness (generalized): Secondary | ICD-10-CM

## 2015-11-12 DIAGNOSIS — R262 Difficulty in walking, not elsewhere classified: Secondary | ICD-10-CM

## 2015-11-12 DIAGNOSIS — M25662 Stiffness of left knee, not elsewhere classified: Secondary | ICD-10-CM | POA: Diagnosis not present

## 2015-11-12 DIAGNOSIS — R600 Localized edema: Secondary | ICD-10-CM

## 2015-11-12 DIAGNOSIS — M25562 Pain in left knee: Secondary | ICD-10-CM

## 2015-11-12 NOTE — Therapy (Signed)
[facility] [address] [place], [place], [address] Phone: [phone]   Fax:  [phone]  Physical Therapy Treatment  Patient Details  Name: Marvin Munoz MRN: [id number] Date of Birth: [birth year] Referring Provider: Marchia Bond   Encounter Date: 11/12/2015      PT End of Session - 11/12/15 1031    Visit Number 11   Number of Visits 18   Date for PT Re-Evaluation 11/17/15   Authorization Type UHC    Authorization Time Period 10/06/15 to 11/17/15   PT Start Time 0945   PT Stop Time 1028   PT Time Calculation (min) 43 min   Activity Tolerance Patient tolerated treatment well   Behavior During Therapy [facility] for tasks assessed/performed      Past Medical History  Diagnosis Date  . Aortic insufficiency due to bicuspid aortic valve   . HTN (hypertension)   . Gastroesophageal reflux   . PVC's (premature ventricular contractions)     hx of benign  . Fever blister   . Migraine headache   . Bursitis of elbow     gouty olecranon, right elbow  . Guillain-Barre syndrome ([place])   . Allergic rhinitis   . BPH (benign prostatic hyperplasia)   . Eczema     forearm  . Restless legs syndrome (RLS)   . Foot drop, right 09/24/2014  . 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 Guillain Barr syndrome ([place]) 1970's  . Guillain Barr syndrome ([place])   . Nocturnal leg cramps 09/26/2014    Past Surgical History  Procedure Laterality Date  . Breast lumpectomy Right   . Tonsillectomy    . Cardiac catheterization    . Colonoscopy      There were no vitals filed for this visit.      Subjective Assessment - 11/12/15 0946    Subjective Patient arrives reporting he is doing well; no pain at rest but when he exercises he can defniitely feel it. Reports that pain can get to 7-8/10 when he is moving around, felt it catch and lock the other day.    Currently in Pain? No/denies  but can get to 7-8/10 with motion and exercise                           [facility] Adult PT Treatment/Exercise - 11/12/15 0001    Knee/Hip Exercises: Stretches   Active Hamstring Stretch Both;3 reps;30 seconds   Active Hamstring Stretch Limitations 12in step   Quad Stretch Both;3 reps;30 seconds   Quad Stretch Limitations prone    Knee: Self-Stretch to increase Flexion Left   Knee: Self-Stretch Limitations 15 reps, 10 second hold each    Gastroc Stretch Both;3 reps;30 seconds   Gastroc Stretch Limitations slantboard    Other Knee/Hip Stretches hip adductors 2x30 seconds on stairs each side    Knee/Hip Exercises: Aerobic   Stationary Bike full revolutaionts seat 12, 10 minutes   unsupervised at end of session, not included in billing    Knee/Hip Exercises: Standing   Heel Raises Both;1 set;15 reps   Heel Raises Limitations heel and toe    Forward Lunges Both;1 set;10 reps   Forward Lunges Limitations 4 inch step    Lateral Step Up Both;1 set;10 reps   Lateral Step Up Limitations 4 inch step    Forward Step Up Both;1 set;10 reps   Forward Step Up Limitations 4 inch step    Rocker  Board 2 minutes   Rocker Board Limitations AP and alteral, no HHA    Other Standing Knee Exercises --   Knee/Hip Exercises: Seated   Long Arc Quad Both;2 sets;10 reps   Long Arc Quad Limitations 4   Hamstring Curl Both;2 sets;10 reps    Hamstring Weights 4 lbs.                PT Education - 11/12/15 1031    Education provided Yes   Education Details grinding/pain in knee could possibly be related to ongoing muscle weakness in muscles surrounding knee, and may improve with strengthening over time    Person(s) Educated Patient   Methods Explanation   Comprehension Verbalized understanding          PT Short Term Goals - 10/27/15 1005    PT SHORT TERM GOAL #1   Title Patient to demonstrate L knee ROM of 0-115 degrees in order to improve gait and overall mobility, reduce pain    Baseline 5/9- -1 to 108   Time 3   Period Weeks    Status Partially Met   PT SHORT TERM GOAL #2   Title Patient to demosntrate improved gait mechanics, including consistent heel-toe, no hip hiking or unsteadiness, equal step/stance each side, and improved L knee mobility during gait in order to enhance efficiency of gait over both even and unven surfaces with L knee pain no more than 1/10    Baseline 5/9- still hip hiking and still a little unsteady on corners and uneven surfaces    Time 3   Period Weeks   Status Partially Met   PT SHORT TERM GOAL #3   Title Patient to be independent in correctly applying ice, performing retrograde massage, and performing appropraite exercises to manage edema and pain in order to increase efficiency in managing condition    Baseline 5/9- has been adhering to program/PT recommendations    Time 3   Period Weeks   Status Achieved   PT SHORT TERM GOAL #4   Title Patient to be independent in correctly and consistently performing appropriate HEP, to be expanded PRN    Time 3   Period Weeks   Status Achieved           PT Long Term Goals - 10/27/15 1007    PT LONG TERM GOAL #1   Title Patient to demonstrate strength 5/5 in all tested muscle groups in order to reduce pain, enhance regional stability, and improve overall mechanics   Baseline 5/9- improving but goal as of yet unmet    Time 6   Period Weeks   Status On-going   PT LONG TERM GOAL #2   Title Patient to be able to ambulate 1347f during 6MWT in order to demosntrate readiness to return to safe community based activities and mobility    Baseline 5/9- 13060fduring 6MWT    Time 6   Period Weeks   Status Achieved   PT LONG TERM GOAL #3   Title Patient to be able to perform reciprocal stair ascent and descent with no unsteadiness, good eccentric control, and pain L knee no more than 1/10 in order to enhance independence and safety with community moblity    Baseline 5/9- poor eccentric control, difficulty with descent    Time 6   Period Weeks    Status On-going   PT LONG TERM GOAL #4   Title Patient to report he has been able to complete all yardwork around his home,  and has been able to return to light golfing activities with L knee pain no more than 1/10 in order to assist in returning to PLOF and improving QOL    Baseline 5/9- has not tried yet    Time 6   Period Weeks   Status On-going               Plan - 11/12/15 1011    Clinical Impression Statement Patient arrives today reporting that he is cointinuing to having worsening grinding in the medial portiion of his knee that is worse with weight bearing exericse, and he belives it is becoming worse over time- he is giong to see his MD next week about this. Focused on functional stretching and introduce open chain quad and hamstring strengthening today in order to assist in reducing stress on knee. Finished sesssion with some cautious closed chain strengtehning today with close monitoring of patient pain and tolerance to exercise. Introduced hip adductor stretches today as well  with good tolerance by patient. Suspect that grinding/pain in knee may partially be related to ongoing weakness in L knee, as functional strengtehning was only recently initiated, and this could possibly improve with extended strengthening over time.     Rehab Potential Good   PT Frequency Other (comment)   PT Duration 3 weeks   PT Treatment/Interventions ADLs/Self Care Home Management;Cryotherapy;Gait training;Stair training;Functional mobility training;Therapeutic activities;Therapeutic exercise;Balance training;Neuromuscular re-education;Patient/family education;Manual techniques;Scar mobilization;Passive range of motion;Taping   PT Next Visit Plan Progress functional strengtheing due to improved ROM at 1-118,  Pregait activities/ gait and balance, stair training.  Begin lunges next session. Continue manual to L quad. Open chain strengthening as well.    Consulted and Agree with Plan of Care Patient       Patient will benefit from skilled therapeutic intervention in order to improve the following deficits and impairments:  Abnormal gait, Hypomobility, Decreased skin integrity, Decreased scar mobility, Increased edema, Decreased strength, Pain, Difficulty walking, Decreased mobility, Increased muscle spasms, Improper body mechanics, Decreased range of motion, Impaired flexibility  Visit Diagnosis: Stiffness of left knee, not elsewhere classified  Localized edema  Muscle weakness (generalized)  Difficulty in walking, not elsewhere classified  Pain in left knee     Problem List Patient Active Problem List   Diagnosis Date Noted  . Nocturnal leg cramps 09/26/2014  . Foot drop, right 09/24/2014  . 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 Upper airway cough syndrome 09/10/2014  . Aortic valve disorder 11/19/2013  . Essential hypertension 11/19/2013  . Esophageal reflux 11/19/2013  . Gout 11/19/2013  . Aortic root enlargement ([place]) 11/19/2013    Deniece Ree PT, DPT [facility] [address] [place], [place], [address] Phone: [phone]   Fax:  [phone]  Name: Marvin Munoz MRN: [id number] Date of Birth: Jul 12, 1960

## 2015-11-13 ENCOUNTER — Ambulatory Visit (HOSPITAL_COMMUNITY): Payer: 59

## 2015-11-13 DIAGNOSIS — M25562 Pain in left knee: Secondary | ICD-10-CM

## 2015-11-13 DIAGNOSIS — R262 Difficulty in walking, not elsewhere classified: Secondary | ICD-10-CM

## 2015-11-13 DIAGNOSIS — M25662 Stiffness of left knee, not elsewhere classified: Secondary | ICD-10-CM | POA: Diagnosis not present

## 2015-11-13 DIAGNOSIS — M6281 Muscle weakness (generalized): Secondary | ICD-10-CM

## 2015-11-13 DIAGNOSIS — R600 Localized edema: Secondary | ICD-10-CM

## 2015-11-13 NOTE — Therapy (Signed)
[facility] [address] [place], [place], [address] Phone: [phone]   Fax:  [phone]  Physical Therapy Treatment  Patient Details  Name: Marvin Munoz MRN: [id number] Date of Birth: July 08, 1960 Referring Provider: Marchia Bond   Encounter Date: 11/13/2015      PT End of Session - 11/13/15 0820    Visit Number 12   Number of Visits 18   Date for PT Re-Evaluation 11/17/15  MD progress note next session prior MD apt 11/18/2015   Authorization Type UHC    Authorization Time Period 10/06/15 to 11/17/15   PT Start Time 0813   PT Stop Time 0906   PT Time Calculation (min) 53 min   Activity Tolerance Patient tolerated treatment well   Behavior During Therapy [facility] for tasks assessed/performed      Past Medical History  Diagnosis Date  . Aortic insufficiency due to bicuspid aortic valve   . HTN (hypertension)   . Gastroesophageal reflux   . PVC's (premature ventricular contractions)     hx of benign  . Fever blister   . Migraine headache   . Bursitis of elbow     gouty olecranon, right elbow  . Guillain-Barre syndrome ([place])   . Allergic rhinitis   . BPH (benign prostatic hyperplasia)   . Eczema     forearm  . Restless legs syndrome (RLS)   . Foot drop, right 09/24/2014  . 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 Guillain Barr syndrome ([place]) 1970's  . Guillain Barr syndrome ([place])   . Nocturnal leg cramps 09/26/2014    Past Surgical History  Procedure Laterality Date  . Breast lumpectomy Right   . Tonsillectomy    . Cardiac catheterization    . Colonoscopy      There were no vitals filed for this visit.      Subjective Assessment - 11/13/15 0812    Subjective Pt stated he continues to c/o knee grinding, goes back to MD next Wednesday.  Current pain scale 5-6/10   Pertinent History HTN, gout, foot drop R, history of GBS, history of cardiac cath     Patient Stated Goals get knee back to where it was,  loosen it up a bit, get out to do yardwork and golf    Currently in Pain? Yes   Pain Score 6    Pain Location Knee   Pain Orientation Left   Pain Descriptors / Indicators --  Grinding   Pain Type Surgical pain   Pain Radiating Towards none   Pain Onset More than a month ago   Pain Frequency Constant   Aggravating Factors  staying still for too long, being up on feet for too long   Pain Relieving Factors movement, ice   Effect of Pain on Daily Activities still not back at work, still limited with [name] Adult PT Treatment/Exercise - 11/13/15 0001    Knee/Hip Exercises: Stretches   Active Hamstring Stretch Both;3 reps;30 seconds   Active Hamstring Stretch Limitations 12in step   Gastroc Stretch Both;3 reps;30 seconds   Gastroc Stretch Limitations slantboard    Knee/Hip Exercises: Aerobic   Stationary Bike full revolutaionts seat 13, 10 minutes EOS no charge   Knee/Hip Exercises: Standing   Heel Raises Both;1 set;15 reps   Heel Raises Limitations heel and toe    Forward Lunges Both;15 reps   Forward Lunges Limitations 4  inch step    Lateral Step Up Both;1 set;10 reps   Lateral Step Up Limitations 4 inch step    Forward Step Up Both;1 set;10 reps   Forward Step Up Limitations 4 inch step    Step Down Both;1 set;10 reps   Step Down Limitations 4 inch step    Functional Squat 10 reps   Functional Squat Limitations Reports of grinding   Rocker Board 2 minutes   Rocker Board Limitations AP and lateral, no HHA    Manual Therapy   Manual Therapy Soft tissue mobilization;Joint mobilization   Manual therapy comments completed separately from all other therex and interventions   Joint Mobilization patella mobs M/L and S/L   Soft tissue mobilization supine L quad             PT Education - 11/12/15 1031    Education provided Yes   Education Details grinding/pain in knee could possibly be related to ongoing muscle weakness in muscles surrounding knee,  and may improve with strengthening over time    Person(s) Educated Patient   Methods Explanation   Comprehension Verbalized understanding          PT Short Term Goals - 10/27/15 1005    PT SHORT TERM GOAL #1   Title Patient to demonstrate L knee ROM of 0-115 degrees in order to improve gait and overall mobility, reduce pain    Baseline 5/9- -1 to 108   Time 3   Period Weeks   Status Partially Met   PT SHORT TERM GOAL #2   Title Patient to demosntrate improved gait mechanics, including consistent heel-toe, no hip hiking or unsteadiness, equal step/stance each side, and improved L knee mobility during gait in order to enhance efficiency of gait over both even and unven surfaces with L knee pain no more than 1/10    Baseline 5/9- still hip hiking and still a little unsteady on corners and uneven surfaces    Time 3   Period Weeks   Status Partially Met   PT SHORT TERM GOAL #3   Title Patient to be independent in correctly applying ice, performing retrograde massage, and performing appropraite exercises to manage edema and pain in order to increase efficiency in managing condition    Baseline 5/9- has been adhering to program/PT recommendations    Time 3   Period Weeks   Status Achieved   PT SHORT TERM GOAL #4   Title Patient to be independent in correctly and consistently performing appropriate HEP, to be expanded PRN    Time 3   Period Weeks   Status Achieved           PT Long Term Goals - 10/27/15 1007    PT LONG TERM GOAL #1   Title Patient to demonstrate strength 5/5 in all tested muscle groups in order to reduce pain, enhance regional stability, and improve overall mechanics   Baseline 5/9- improving but goal as of yet unmet    Time 6   Period Weeks   Status On-going   PT LONG TERM GOAL #2   Title Patient to be able to ambulate 1344f during 6MWT in order to demosntrate readiness to return to safe community based activities and mobility    Baseline 5/9- 13047fduring  6MWT    Time 6   Period Weeks   Status Achieved   PT LONG TERM GOAL #3   Title Patient to be able to perform reciprocal stair ascent and descent with no  unsteadiness, good eccentric control, and pain L knee no more than 1/10 in order to enhance independence and safety with community moblity    Baseline 5/9- poor eccentric control, difficulty with descent    Time 6   Period Weeks   Status On-going   PT LONG TERM GOAL #4   Title Patient to report he has been able to complete all yardwork around his home, and has been able to return to light golfing activities with L knee pain no more than 1/10 in order to assist in returning to PLOF and improving QOL    Baseline 5/9- has not tried yet    Time 6   Period Weeks   Status On-going               Plan - 11/13/15 0826    Clinical Impression Statement Pt continues to c/o crepitus on medial portion of knee with closed chain exercises mainly functional squats and step down training, plans for MD apt next Wednesday;  Session focus on functional strenghtening within pt. tolerance and minimal reports of increased pain.  Pt able to demonstrate appropriate form wtih exercises with min cueing for form and technique.  Pt does continue to demonstrate weak quad strengthening with decreased control descending step and visible musculature fatigue with activity.  Ended session wtih manual soft tissue mobilization to reduce tightness Lt quadriceps and patella mobilty to improve tracking.  Pt reports pain reduced to 5/10 at EOS following manual.  Ended session on bike for mobilty, no charge for unsupervised.     Rehab Potential Good   PT Frequency Other (comment)   PT Duration 3 weeks   PT Treatment/Interventions ADLs/Self Care Home Management;Cryotherapy;Gait training;Stair training;Functional mobility training;Therapeutic activities;Therapeutic exercise;Balance training;Neuromuscular re-education;Patient/family education;Manual techniques;Scar  mobilization;Passive range of motion;Taping   PT Next Visit Plan Complete progress note prior MD apt on 11/18/2015.  Continue progressing functional strengtheninig.  Pregait activities/ gait and balance/ stair training.  Manual to L quad PRN.        Patient will benefit from skilled therapeutic intervention in order to improve the following deficits and impairments:  Abnormal gait, Hypomobility, Decreased skin integrity, Decreased scar mobility, Increased edema, Decreased strength, Pain, Difficulty walking, Decreased mobility, Increased muscle spasms, Improper body mechanics, Decreased range of motion, Impaired flexibility  Visit Diagnosis: Stiffness of left knee, not elsewhere classified  Localized edema  Muscle weakness (generalized)  Difficulty in walking, not elsewhere classified  Pain in left knee     Problem List Patient Active Problem List   Diagnosis Date Noted  . Nocturnal leg cramps 09/26/2014  . Foot drop, right 09/24/2014  . 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 Upper airway cough syndrome 09/10/2014  . Aortic valve disorder 11/19/2013  . Essential hypertension 11/19/2013  . Esophageal reflux 11/19/2013  . Gout 11/19/2013  . Aortic root enlargement [facility]) 11/19/2013   Marvin Munoz, LPTA; [place]  Aldona Lento 11/13/2015, 9:31 AM  [facility] [address] [place], [place], [address] Phone: [phone]   Fax:  [phone]  Name: Marvin Munoz MRN: [id number] Date of Birth: [birth year]

## 2015-11-17 ENCOUNTER — Ambulatory Visit (HOSPITAL_COMMUNITY): Payer: 59 | Admitting: Physical Therapy

## 2015-11-17 DIAGNOSIS — M6281 Muscle weakness (generalized): Secondary | ICD-10-CM

## 2015-11-17 DIAGNOSIS — R600 Localized edema: Secondary | ICD-10-CM

## 2015-11-17 DIAGNOSIS — M25562 Pain in left knee: Secondary | ICD-10-CM

## 2015-11-17 DIAGNOSIS — R262 Difficulty in walking, not elsewhere classified: Secondary | ICD-10-CM

## 2015-11-17 DIAGNOSIS — M25662 Stiffness of left knee, not elsewhere classified: Secondary | ICD-10-CM | POA: Diagnosis not present

## 2015-11-17 NOTE — Therapy (Signed)
[facility] [address] [place], [place], [address] Phone: [phone]   Fax:  [phone]  Physical Therapy Treatment (Re-Assessment)  Patient Details  Name: Marvin Munoz MRN: [id number] Date of Birth: 05/21/1961 Referring Provider: Marchia Bond   Encounter Date: 11/17/2015      PT End of Session - 11/17/15 0940    Visit Number 13   Number of Visits 19   Date for PT Re-Evaluation 12/08/15   Authorization Type UHC    Authorization Time Period 0000000 to [id number]; recert done on XX123456   PT Start Time 0814   PT Stop Time 0855   PT Time Calculation (min) 41 min   Activity Tolerance Patient tolerated treatment well   Behavior During Therapy [facility] for tasks assessed/performed      Past Medical History  Diagnosis Date  . Aortic insufficiency due to bicuspid aortic valve   . HTN (hypertension)   . Gastroesophageal reflux   . PVC's (premature ventricular contractions)     hx of benign  . Fever blister   . Migraine headache   . Bursitis of elbow     gouty olecranon, right elbow  . Guillain-Barre syndrome ([place])   . Allergic rhinitis   . BPH (benign prostatic hyperplasia)   . Eczema     forearm  . Restless legs syndrome (RLS)   . Foot drop, right 09/24/2014  . 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 Guillain Barr syndrome ([place]) 1970's  . Guillain Barr syndrome ([place])   . Nocturnal leg cramps 09/26/2014    Past Surgical History  Procedure Laterality Date  . Breast lumpectomy Right   . Tonsillectomy    . Cardiac catheterization    . Colonoscopy      There were no vitals filed for this visit.      Subjective Assessment - 11/17/15 0815    Subjective Patient reports that things are going pretty well right now, is still having that grnding/catching/popping in his L knee at times which he remains concerned about. No falls or close calls recently. Patient reports that it is still hard for him to wakl extended periods  of time, navigating stairs, standing on LE for extended periods of time, and is still having trouble getting in and out of cars.    How long can you sit comfortably? 5/30- 60 minutes    How long can you stand comfortably? 5/30- 15-20 minutes    How long can you walk comfortably? 5/30- 1/2 mile uphill, maybe 10 minutes    Patient Stated Goals get knee back to where it was, loosen it up a bit, get out to do yardwork and golf    Currently in Pain? Yes   Pain Score 6    Pain Location Knee   Pain Orientation Left   Pain Descriptors / Indicators Sore;Aching   Pain Type Surgical pain   Pain Radiating Towards none    Pain Onset More than a month ago   Pain Frequency Intermittent   Aggravating Factors  being up on feet for too long, walking    Pain Relieving Factors shifting weight off of L LE, ice    Effect of Pain on Daily Activities still not back at work, limited with weight bearing             [facility] PT Assessment - 11/17/15 0001    Assessment   Medical Diagnosis L partial knee replacement    Referring Provider Marchia Bond  Onset Date/Surgical Date 09/10/15   Next MD Visit Mardelle Matte on the [place]   Has the patient fallen in the past 6 months No   Has the patient had a decrease in activity level because of a fear of falling?  No   Is the patient reluctant to leave their home because of a fear of falling?  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    Leisure golf, getting outside    Observation/Other Assessments   Focus on Therapeutic Outcomes (FOTO)  49% limited    AROM   Left Knee Extension 0   Left Knee Flexion 119   Strength   Right Hip Flexion 5/5   Right Hip Extension 4+/5   Right Hip ABduction 5/5   Left Hip Flexion 5/5   Left Hip  Extension 4+/5   Left Hip ABduction 5/5   Right Knee Flexion 4+/5   Right Knee Extension 4+/5   Left Knee Flexion 4/5   Left Knee Extension 4/5   Right Ankle Dorsiflexion 5/5   Left Ankle Dorsiflexion 5/5   Ambulation/Gait   Gait Comments continued reduced eccentric strength L LE with stair descent reciprocally    6 minute walk test results    Aerobic Endurance Distance Walked 1381   Endurance additional comments 6MWT    Timed Up and Go Test   Normal TUG (seconds) 7.98                     [facility] Adult PT Treatment/Exercise - 11/17/15 0001    Knee/Hip Exercises: Aerobic   Stationary Bike full revolutaionts seat 13, 10 minutes EOS no charge   Knee/Hip Exercises: Standing   Heel Raises Both;1 set;20 reps   Heel Raises Limitations heel and toe    Forward Lunges Both;1 set;10 reps   Forward Lunges Limitations 2 inch box    Lateral Step Up --   Lateral Step Up Limitations --   Forward Step Up Both;1 set;10 reps   Forward Step Up Limitations 6 inch box    Step Down Both;1 set;10 reps   Step Down Limitations 4 inch step    Knee/Hip Exercises: Seated   Long Arc Quad Both;2 sets;10 reps   Long Arc Quad Limitations 5   Hamstring Curl Both;2 sets;10 reps    Hamstring Weights 5 lbs.                PT Education - 11/17/15 0847    Education provided Yes   Education Details progress with skilled PT services, POC moving forward    Person(s) Educated Patient   Methods Explanation   Comprehension Verbalized understanding          PT Short Term Goals - 11/17/15 0836    PT SHORT TERM GOAL #1   Title Patient to demonstrate L knee ROM of 0-115 degrees in order to improve gait and overall mobility, reduce pain    Baseline 5/30- 0-119   Time 3   Period Weeks   Status Achieved   PT SHORT TERM GOAL #2   Title Patient to demosntrate improved gait mechanics, including consistent heel-toe, no hip hiking or unsteadiness, equal step/stance each side, and improved L knee  mobility during gait in order to enhance efficiency of gait  over both even and unven surfaces with L knee pain no more than 1/10    Baseline 5/30- some weight shift away from L due to grinding/new pain but overall much improved    Time 3   Period Weeks   Status Achieved   PT SHORT TERM GOAL #3   Title Patient to be independent in correctly applying ice, performing retrograde massage, and performing appropraite exercises to manage edema and pain in order to increase efficiency in managing condition    Time 3   Period Weeks   Status Achieved   PT SHORT TERM GOAL #4   Title Patient to be independent in correctly and consistently performing appropriate HEP, to be expanded PRN    Time 3   Period Weeks   Status Achieved           PT Long Term Goals - 11/17/15 [id number]    PT LONG TERM GOAL #1   Title Patient to demonstrate strength 5/5 in all tested muscle groups in order to reduce pain, enhance regional stability, and improve overall mechanics   Baseline 5/30- improving but goal still unmet    Time 6   Period Weeks   Status On-going   PT LONG TERM GOAL #2   Title Patient to be able to ambulate 1398ft during 6MWT in order to demosntrate readiness to return to safe community based activities and mobility    Baseline 5/30- 1349ft   Time 6   Period Weeks   Status Achieved   PT LONG TERM GOAL #3   Title Patient to be able to perform reciprocal stair ascent and descent with no unsteadiness, good eccentric control, and pain L knee no more than 1/10 in order to enhance independence and safety with community moblity    Baseline 5/30- continues to have impaired eccentric control, difficulty with descent on 7 inch stairs, increased L knee pain    Time 6   Period Weeks   Status On-going   PT LONG TERM GOAL #4   Title Patient to report he has been able to complete all yardwork around his home, and has been able to return to light golfing activities with L knee pain no more than 1/10 in order to  assist in returning to PLOF and improving QOL    Baseline 5/30- has been doing yardwork in steps, has not tried golf    Time 6   Period Weeks   Status On-going               Plan - 11/17/15 [id number]    Clinical Impression Statement Re-asssesment performed today. Patient shows good progress with skilled PT services, with L knee ROM now being full and strength improving with ongoing skilled PT services. Patient does however continue to be limited in weight bearing activities due to grinding/catching pain in medial knee, which he reports has also had an impact on his tolerance to being up on his feet including walking, static/dynamic standing, and yardwork. He also continues to have difficulty with eccentric strength based activities inclding in particular stair descent. At this time recommend extension of skilled PT services in order to address remaining strength deficits and to monitor symptoms in medial L knee.    Rehab Potential Good   PT Frequency 2x / week   PT Duration 3 weeks   PT Treatment/Interventions ADLs/Self Care Home Management;Cryotherapy;Gait training;Stair training;Functional mobility training;Therapeutic activities;Therapeutic exercise;Balance training;Neuromuscular re-education;Patient/family education;Manual techniques;Scar mobilization;Passive range of motion;Taping   PT Next Visit Plan Continue progressing  functional strengtheninig, including eccentric strength  Pregait activities/ gait and balance/ stair training.  Manual to L quad PRN.     Consulted and Agree with Plan of Care Patient      Patient will benefit from skilled therapeutic intervention in order to improve the following deficits and impairments:  Abnormal gait, Hypomobility, Decreased skin integrity, Decreased scar mobility, Increased edema, Decreased strength, Pain, Difficulty walking, Decreased mobility, Increased muscle spasms, Improper body mechanics, Decreased range of motion, Impaired flexibility  Visit  Diagnosis: Stiffness of left knee, not elsewhere classified - Plan: PT plan of care cert/re-cert  Localized edema - Plan: PT plan of care cert/re-cert  Muscle weakness (generalized) - Plan: PT plan of care cert/re-cert  Difficulty in walking, not elsewhere classified - Plan: PT plan of care cert/re-cert  Pain in left knee - Plan: PT plan of care cert/re-cert     Problem List Patient Active Problem List   Diagnosis Date Noted  . Nocturnal leg cramps 09/26/2014  . Foot drop, right 09/24/2014  . 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 Upper airway cough syndrome 09/10/2014  . Aortic valve disorder 11/19/2013  . Essential hypertension 11/19/2013  . Esophageal reflux 11/19/2013  . Gout 11/19/2013  . Aortic root enlargement ([place]) 11/19/2013    Deniece Ree PT, DPT [facility] [address] [place], [place], [address] Phone: [phone]   Fax:  [phone]  Name: CONROY SEITZ MRN: [id number] Date of Birth: August 17, 1960

## 2015-11-19 ENCOUNTER — Encounter (HOSPITAL_COMMUNITY): Payer: 59

## 2015-11-19 ENCOUNTER — Ambulatory Visit (HOSPITAL_COMMUNITY): Payer: 59 | Attending: Orthopedic Surgery | Admitting: Physical Therapy

## 2015-11-19 DIAGNOSIS — M25662 Stiffness of left knee, not elsewhere classified: Secondary | ICD-10-CM | POA: Diagnosis present

## 2015-11-19 DIAGNOSIS — M25562 Pain in left knee: Secondary | ICD-10-CM | POA: Diagnosis present

## 2015-11-19 DIAGNOSIS — R262 Difficulty in walking, not elsewhere classified: Secondary | ICD-10-CM

## 2015-11-19 DIAGNOSIS — M6281 Muscle weakness (generalized): Secondary | ICD-10-CM | POA: Diagnosis present

## 2015-11-19 DIAGNOSIS — R600 Localized edema: Secondary | ICD-10-CM | POA: Insufficient documentation
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
## 2015-11-24 ENCOUNTER — Encounter (HOSPITAL_COMMUNITY): Payer: 59 | Admitting: Physical Therapy

## 2015-11-25 ENCOUNTER — Ambulatory Visit (HOSPITAL_COMMUNITY): Payer: 59

## 2015-11-25 DIAGNOSIS — M25562 Pain in left knee: Secondary | ICD-10-CM

## 2015-11-25 DIAGNOSIS — M25662 Stiffness of left knee, not elsewhere classified: Secondary | ICD-10-CM

## 2015-11-25 DIAGNOSIS — M6281 Muscle weakness (generalized): Secondary | ICD-10-CM

## 2015-11-25 DIAGNOSIS — R262 Difficulty in walking, not elsewhere classified: Secondary | ICD-10-CM

## 2015-11-25 DIAGNOSIS — R600 Localized edema: Secondary | ICD-10-CM

## 2015-11-25 NOTE — Therapy (Signed)
[facility] [address] [place], [place], [address] Phone: [phone]   Fax:  [phone]  Physical Therapy Treatment  Patient Details  Name: Marvin Munoz MRN: [id number] Date of Birth: June 16, 1961 Referring Provider: Marchia Bond   Encounter Date: 11/25/2015      PT End of Session - 11/25/15 1029    Visit Number 15   Number of Visits 19   Date for PT Re-Evaluation 12/08/15   Authorization Type UHC    Authorization Time Period 0000000 to [id number]; recert done on 0000000   PT Start Time 1027   PT Stop Time 1120   PT Time Calculation (min) 53 min   Activity Tolerance Patient tolerated treatment well   Behavior During Therapy [facility] for tasks assessed/performed      Past Medical History  Diagnosis Date  . Aortic insufficiency due to bicuspid aortic valve   . HTN (hypertension)   . Gastroesophageal reflux   . PVC's (premature ventricular contractions)     hx of benign  . Fever blister   . Migraine headache   . Bursitis of elbow     gouty olecranon, right elbow  . Guillain-Barre syndrome ([place])   . Allergic rhinitis   . BPH (benign prostatic hyperplasia)   . Eczema     forearm  . Restless legs syndrome (RLS)   . Foot drop, right 09/24/2014  . 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 Guillain Barr syndrome ([place]) 1970's  . Guillain Barr syndrome ([place])   . Nocturnal leg cramps 09/26/2014    Past Surgical History  Procedure Laterality Date  . Breast lumpectomy Right   . Tonsillectomy    . Cardiac catheterization    . Colonoscopy      There were no vitals filed for this visit.      Subjective Assessment - 11/25/15 1028    Subjective Pt stated the grinding continues, pain scale 4/10 achey today.     Pertinent History HTN, gout, foot drop R, history of GBS, history of cardiac cath     Patient Stated Goals get knee back to where it was, loosen it up a bit, get out to do yardwork and golf    Currently in  Pain? Yes   Pain Score 4    Pain Location Knee   Pain Orientation Left   Pain Descriptors / Indicators Aching   Pain Type Surgical pain   Pain Radiating Towards none`   Pain Onset More than a month ago   Pain Frequency Intermittent   Aggravating Factors  being up on feet for too long, walking   Pain Relieving Factors shifting weight off of L LE, ice   Effect of Pain on Daily Activities still not back at work, limited with weight bearing               [facility] Adult PT Treatment/Exercise - 11/25/15 0001    Knee/Hip Exercises: Aerobic   Stationary Bike full revolutaionts seat 13, 5 minutes EOS no charge   Knee/Hip Exercises: Standing   Heel Raises 15 reps   Heel Raises Limitations Bil up and Lt only down   Forward Lunges Both;1 set;15 reps   Forward Lunges Limitations 2 inch box with theraband to reduce valgus   Lateral Step Up Left;15 reps;Hand Hold: 0;Step Height: 6"   Lateral Step Up Limitations 6in step with theraband to reduce valgus   Forward Step Up Both;1 set;10 reps   Forward Step Up Limitations  6 inch box with theraband to reduce valgus   Step Down Both;1 set;15 reps   Step Down Limitations 4 inch step with theraband to reduce valgus   Wall Squat 10 reps   Wall Squat Limitations with theraband around knee   SLS with Vectors 5x 10" on airex   Other Standing Knee Exercises sidestepping 2RT with RTB             PT Short Term Goals - 11/17/15 0836    PT SHORT TERM GOAL #1   Title Patient to demonstrate L knee ROM of 0-115 degrees in order to improve gait and overall mobility, reduce pain    Baseline 5/30- 0-119   Time 3   Period Weeks   Status Achieved   PT SHORT TERM GOAL #2   Title Patient to demosntrate improved gait mechanics, including consistent heel-toe, no hip hiking or unsteadiness, equal step/stance each side, and improved L knee mobility during gait in order to enhance efficiency of gait over both even and unven surfaces with L knee pain no more than  1/10    Baseline 5/30- some weight shift away from L due to grinding/new pain but overall much improved    Time 3   Period Weeks   Status Achieved   PT SHORT TERM GOAL #3   Title Patient to be independent in correctly applying ice, performing retrograde massage, and performing appropraite exercises to manage edema and pain in order to increase efficiency in managing condition    Time 3   Period Weeks   Status Achieved   PT SHORT TERM GOAL #4   Title Patient to be independent in correctly and consistently performing appropriate HEP, to be expanded PRN    Time 3   Period Weeks   Status Achieved           PT Long Term Goals - 11/17/15 [id number]    PT LONG TERM GOAL #1   Title Patient to demonstrate strength 5/5 in all tested muscle groups in order to reduce pain, enhance regional stability, and improve overall mechanics   Baseline 5/30- improving but goal still unmet    Time 6   Period Weeks   Status On-going   PT LONG TERM GOAL #2   Title Patient to be able to ambulate 1348ft during 6MWT in order to demosntrate readiness to return to safe community based activities and mobility    Baseline 5/30- 1365ft   Time 6   Period Weeks   Status Achieved   PT LONG TERM GOAL #3   Title Patient to be able to perform reciprocal stair ascent and descent with no unsteadiness, good eccentric control, and pain L knee no more than 1/10 in order to enhance independence and safety with community moblity    Baseline 5/30- continues to have impaired eccentric control, difficulty with descent on 7 inch stairs, increased L knee pain    Time 6   Period Weeks   Status On-going   PT LONG TERM GOAL #4   Title Patient to report he has been able to complete all yardwork around his home, and has been able to return to light golfing activities with L knee pain no more than 1/10 in order to assist in returning to PLOF and improving QOL    Baseline 5/30- has been doing yardwork in steps, has not tried golf    Time  6   Period Weeks   Status On-going  Plan - 11/25/15 1056    Clinical Impression Statement Session focus on improving functional strengtheing with focus on improving knee mechanics.  Noted genu valgus with stair training.  Added theraband for increase tactile cueing and verbal cueing to reduce valgus and improve mechanics with reports of decreased grinding/ crepitus.  End of session no reports of increased pain.  EOS on bicycle to continues with knee mobility, seat 13.   Rehab Potential Good   PT Frequency 2x / week   PT Duration 3 weeks   PT Treatment/Interventions ADLs/Self Care Home Management;Cryotherapy;Gait training;Stair training;Functional mobility training;Therapeutic activities;Therapeutic exercise;Balance training;Neuromuscular re-education;Patient/family education;Manual techniques;Scar mobilization;Passive range of motion;Taping   PT Next Visit Plan Continue progressing functional strengtheninig, including eccentric strength  Pregait activities/ gait and balance/ stair training.  Manual to L quad PRN.        Patient will benefit from skilled therapeutic intervention in order to improve the following deficits and impairments:  Abnormal gait, Hypomobility, Decreased skin integrity, Decreased scar mobility, Increased edema, Decreased strength, Pain, Difficulty walking, Decreased mobility, Increased muscle spasms, Improper body mechanics, Decreased range of motion, Impaired flexibility  Visit Diagnosis: Stiffness of left knee, not elsewhere classified  Localized edema  Muscle weakness (generalized)  Difficulty in walking, not elsewhere classified  Pain in left knee     Problem List Patient Active Problem List   Diagnosis Date Noted  . Nocturnal leg cramps 09/26/2014  . Foot drop, right 09/24/2014  . 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 Upper airway cough syndrome 09/10/2014  . Aortic valve disorder 11/19/2013  . Essential  hypertension 11/19/2013  . Esophageal reflux 11/19/2013  . Gout 11/19/2013  . Aortic root enlargement [facility]) 11/19/2013   Ihor Austin, LPTA; [place]   Aldona Lento 11/25/2015, 12:28 PM  [facility] [address] [place], [place], [address] Phone: [phone]   Fax:  [phone]  Name: OBETH LENHARD MRN: [id number] Date of Birth: Mar 22, 1961

## 2015-11-26 ENCOUNTER — Encounter (HOSPITAL_COMMUNITY): Payer: 59 | Admitting: Physical Therapy

## 2015-12-01 ENCOUNTER — Ambulatory Visit (HOSPITAL_COMMUNITY): Payer: 59

## 2015-12-01 DIAGNOSIS — R600 Localized edema: Secondary | ICD-10-CM

## 2015-12-01 DIAGNOSIS — M25562 Pain in left knee: Secondary | ICD-10-CM

## 2015-12-01 DIAGNOSIS — M25662 Stiffness of left knee, not elsewhere classified: Secondary | ICD-10-CM

## 2015-12-01 DIAGNOSIS — R262 Difficulty in walking, not elsewhere classified: Secondary | ICD-10-CM

## 2015-12-01 DIAGNOSIS — M6281 Muscle weakness (generalized): Secondary | ICD-10-CM

## 2015-12-01 NOTE — Therapy (Signed)
[facility] [address] [place], [place], [address] Phone: [phone]   Fax:  [phone]  Physical Therapy Treatment  Patient Details  Name: Marvin Munoz MRN: [id number] Date of Birth: September 07, 1960 Referring Provider: Marchia Bond   Encounter Date: 12/01/2015      PT End of Session - 12/01/15 0827    Visit Number 16   Number of Visits 19   Date for PT Re-Evaluation 12/08/15   Authorization Type UHC    Authorization Time Period 0000000 to [id number]; recert done on 0000000   PT Start Time 0820   PT Stop Time 0904   PT Time Calculation (min) 44 min   Activity Tolerance Patient tolerated treatment well   Behavior During Therapy [facility] for tasks assessed/performed      Past Medical History  Diagnosis Date  . Aortic insufficiency due to bicuspid aortic valve   . HTN (hypertension)   . Gastroesophageal reflux   . PVC's (premature ventricular contractions)     hx of benign  . Fever blister   . Migraine headache   . Bursitis of elbow     gouty olecranon, right elbow  . Guillain-Barre syndrome ([place])   . Allergic rhinitis   . BPH (benign prostatic hyperplasia)   . Eczema     forearm  . Restless legs syndrome (RLS)   . Foot drop, right 09/24/2014  . 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 Guillain Barr syndrome ([place]) 1970's  . Guillain Barr syndrome ([place])   . Nocturnal leg cramps 09/26/2014    Past Surgical History  Procedure Laterality Date  . Breast lumpectomy Right   . Tonsillectomy    . Cardiac catheterization    . Colonoscopy      There were no vitals filed for this visit.      Subjective Assessment - 12/01/15 0825    Subjective Pt stated the grinding continues, pain scale 5/10   Pertinent History HTN, gout, foot drop R, history of GBS, history of cardiac cath     Patient Stated Goals get knee back to where it was, loosen it up a bit, get out to do yardwork and golf    Currently in Pain? Yes   Pain Score 5    Pain Location Knee   Pain Orientation Left   Pain Descriptors / Indicators Aching;Sore   Pain Type Surgical pain   Pain Radiating Towards none   Pain Onset More than a month ago   Pain Frequency Intermittent   Aggravating Factors  being up on feet for too long, walking   Pain Relieving Factors shifting weight off of L LE, ice   Effect of Pain on Daily Activities still not back at work, limited with weight bearing              [facility] Adult PT Treatment/Exercise - 12/01/15 0001    Knee/Hip Exercises: Aerobic   Stationary Bike full revolutaionts seat 13, 5 minutes EOS no charge   Knee/Hip Exercises: Standing   Heel Raises 15 reps   Heel Raises Limitations Lt LE only   Forward Lunges Both;1 set;15 reps   Forward Lunges Limitations 2 inch box with theraband to reduce valgus   Lateral Step Up Left;15 reps;Hand Hold: 0;Step Height: 6"   Lateral Step Up Limitations 6in step with theraband to reduce valgus   Forward Step Up Both;1 set;15 reps   Forward Step Up Limitations 6 inch box with theraband to reduce valgus  Step Down Both;1 set;15 reps   Step Down Limitations 4 inch step with theraband to reduce valgus   Wall Squat 10 reps;5 seconds   Wall Squat Limitations with theraband around knee   SLS with Vectors 5x 10" on airex   Other Standing Knee Exercises sidestepping 2RT with RTB             PT Short Term Goals - 11/17/15 0836    PT SHORT TERM GOAL #1   Title Patient to demonstrate L knee ROM of 0-115 degrees in order to improve gait and overall mobility, reduce pain    Baseline 5/30- 0-119   Time 3   Period Weeks   Status Achieved   PT SHORT TERM GOAL #2   Title Patient to demosntrate improved gait mechanics, including consistent heel-toe, no hip hiking or unsteadiness, equal step/stance each side, and improved L knee mobility during gait in order to enhance efficiency of gait over both even and unven surfaces with L knee pain no more than 1/10     Baseline 5/30- some weight shift away from L due to grinding/new pain but overall much improved    Time 3   Period Weeks   Status Achieved   PT SHORT TERM GOAL #3   Title Patient to be independent in correctly applying ice, performing retrograde massage, and performing appropraite exercises to manage edema and pain in order to increase efficiency in managing condition    Time 3   Period Weeks   Status Achieved   PT SHORT TERM GOAL #4   Title Patient to be independent in correctly and consistently performing appropriate HEP, to be expanded PRN    Time 3   Period Weeks   Status Achieved           PT Long Term Goals - 11/17/15 [id number]    PT LONG TERM GOAL #1   Title Patient to demonstrate strength 5/5 in all tested muscle groups in order to reduce pain, enhance regional stability, and improve overall mechanics   Baseline 5/30- improving but goal still unmet    Time 6   Period Weeks   Status On-going   PT LONG TERM GOAL #2   Title Patient to be able to ambulate 134ft during 6MWT in order to demosntrate readiness to return to safe community based activities and mobility    Baseline 5/30- 139ft   Time 6   Period Weeks   Status Achieved   PT LONG TERM GOAL #3   Title Patient to be able to perform reciprocal stair ascent and descent with no unsteadiness, good eccentric control, and pain L knee no more than 1/10 in order to enhance independence and safety with community moblity    Baseline 5/30- continues to have impaired eccentric control, difficulty with descent on 7 inch stairs, increased L knee pain    Time 6   Period Weeks   Status On-going   PT LONG TERM GOAL #4   Title Patient to report he has been able to complete all yardwork around his home, and has been able to return to light golfing activities with L knee pain no more than 1/10 in order to assist in returning to PLOF and improving QOL    Baseline 5/30- has been doing yardwork in steps, has not tried golf    Time 6    Period Weeks   Status On-going               Plan - 12/01/15  [id number]    Clinical Impression Statement Continued session focus on improving functional strenthening with focus on improving knee mechanics to reduce grinding.  Pt continues to c/o crepitus/grinding with reports of reduced though still continues when adding training to reduce genu valgus.  Reports of increased pain to 6-7/10 at end of session   Rehab Potential Good   PT Frequency 2x / week   PT Duration 3 weeks   PT Treatment/Interventions ADLs/Self Care Home Management;Cryotherapy;Gait training;Stair training;Functional mobility training;Therapeutic activities;Therapeutic exercise;Balance training;Neuromuscular re-education;Patient/family education;Manual techniques;Scar mobilization;Passive range of motion;Taping   PT Next Visit Plan Continue progressing functional strengtheninig, including eccentric strength  Pregait activities/ gait and balance/ stair training.  Manual to L quad PRN.        Patient will benefit from skilled therapeutic intervention in order to improve the following deficits and impairments:  Abnormal gait, Hypomobility, Decreased skin integrity, Decreased scar mobility, Increased edema, Decreased strength, Pain, Difficulty walking, Decreased mobility, Increased muscle spasms, Improper body mechanics, Decreased range of motion, Impaired flexibility  Visit Diagnosis: Stiffness of left knee, not elsewhere classified  Localized edema  Muscle weakness (generalized)  Difficulty in walking, not elsewhere classified  Pain in left knee     Problem List Patient Active Problem List   Diagnosis Date Noted  . Nocturnal leg cramps 09/26/2014  . Foot drop, right 09/24/2014  . 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 Upper airway cough syndrome 09/10/2014  . Aortic valve disorder 11/19/2013  . Essential hypertension 11/19/2013  . Esophageal reflux 11/19/2013  . Gout 11/19/2013  . Aortic  root enlargement [facility]) 11/19/2013   Ihor Austin, LPTA; [place]  Aldona Lento 12/01/2015, 11:03 AM  [facility] [place], [place], [address] Phone: [phone]   Fax:  [phone]  Name: KATINA TAIRA MRN: [id number] Date of Birth: July 17, 1960

## 2015-12-03 ENCOUNTER — Ambulatory Visit (HOSPITAL_COMMUNITY): Payer: 59

## 2015-12-03 DIAGNOSIS — M25662 Stiffness of left knee, not elsewhere classified: Secondary | ICD-10-CM

## 2015-12-03 DIAGNOSIS — M25562 Pain in left knee: Secondary | ICD-10-CM

## 2015-12-03 DIAGNOSIS — R600 Localized edema: Secondary | ICD-10-CM

## 2015-12-03 DIAGNOSIS — M6281 Muscle weakness (generalized): Secondary | ICD-10-CM

## 2015-12-03 DIAGNOSIS — R262 Difficulty in walking, not elsewhere classified: Secondary | ICD-10-CM

## 2015-12-03 NOTE — Therapy (Signed)
[facility] [address] [place], [place], [address] Phone: [phone]   Fax:  [phone]  Physical Therapy Treatment  Patient Details  Name: Marvin Munoz MRN: [id number] Date of Birth: 06-12-1961 Referring Provider: Marchia Bond   Encounter Date: 12/03/2015      PT End of Session - 12/03/15 0824    Visit Number 17   Number of Visits 19   Date for PT Re-Evaluation 12/08/15   Authorization Type UHC    Authorization Time Period 0000000 to [id number]; recert done on 0000000   PT Start Time 0818   PT Stop Time 0901   PT Time Calculation (min) 43 min   Activity Tolerance Patient tolerated treatment well   Behavior During Therapy [facility] for tasks assessed/performed      Past Medical History  Diagnosis Date  . Aortic insufficiency due to bicuspid aortic valve   . HTN (hypertension)   . Gastroesophageal reflux   . PVC's (premature ventricular contractions)     hx of benign  . Fever blister   . Migraine headache   . Bursitis of elbow     gouty olecranon, right elbow  . Guillain-Barre syndrome ([place])   . Allergic rhinitis   . BPH (benign prostatic hyperplasia)   . Eczema     forearm  . Restless legs syndrome (RLS)   . Foot drop, right 09/24/2014  . 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 Guillain Barr syndrome ([place]) 1970's  . Guillain Barr syndrome ([place])   . Nocturnal leg cramps 09/26/2014    Past Surgical History  Procedure Laterality Date  . Breast lumpectomy Right   . Tonsillectomy    . Cardiac catheterization    . Colonoscopy      There were no vitals filed for this visit.      Subjective Assessment - 12/03/15 0822    Subjective Pt stated the grinding continues, plans to make apt with MD concerning the grinding.  Current pain scale 5/10   Pertinent History HTN, gout, foot drop R, history of GBS, history of cardiac cath     Patient Stated Goals get knee back to where it was, loosen it up a bit,  get out to do yardwork and golf    Currently in Pain? Yes   Pain Score 5    Pain Location Knee   Pain Orientation Left   Pain Descriptors / Indicators --  Grinding   Pain Type Surgical pain   Pain Radiating Towards none   Pain Onset More than a month ago   Pain Frequency Intermittent   Aggravating Factors  being up on feet for too long, walking   Pain Relieving Factors shifting weight off of L LE, ice   Effect of Pain on Daily Activities still not back at work, limited with weight bearing             [facility] Adult PT Treatment/Exercise - 12/03/15 0001    Knee/Hip Exercises: Aerobic   Stationary Bike full revolutaionts seat 13, 5 minutes EOS no charge   Knee/Hip Exercises: Standing   Lateral Step Up Left;15 reps;Hand Hold: 0;Step Height: 6"   Lateral Step Up Limitations 6in step with theraband to reduce valgus   Forward Step Up Both;1 set;15 reps   Forward Step Up Limitations 6 inch box with theraband to reduce valgus   Step Down Both;1 set;15 reps;Step Height: 6"   Step Down Limitations 6 in box   Functional Squat 10  reps   Wall Squat 10 reps;5 seconds   Wall Squat Limitations with theraband around knee   SLS with Vectors 5x 10" on airex   Other Standing Knee Exercises sidestepping 2RT with RTB   Knee/Hip Exercises: Supine   Quad Sets 15 reps   Short Arc Quad Sets 15 reps   Short Arc Quad Sets Limitations no c/o grinding              PT Short Term Goals - 11/17/15 0836    PT SHORT TERM GOAL #1   Title Patient to demonstrate L knee ROM of 0-115 degrees in order to improve gait and overall mobility, reduce pain    Baseline 5/30- 0-119   Time 3   Period Weeks   Status Achieved   PT SHORT TERM GOAL #2   Title Patient to demosntrate improved gait mechanics, including consistent heel-toe, no hip hiking or unsteadiness, equal step/stance each side, and improved L knee mobility during gait in order to enhance efficiency of gait over both even and unven surfaces with L  knee pain no more than 1/10    Baseline 5/30- some weight shift away from L due to grinding/new pain but overall much improved    Time 3   Period Weeks   Status Achieved   PT SHORT TERM GOAL #3   Title Patient to be independent in correctly applying ice, performing retrograde massage, and performing appropraite exercises to manage edema and pain in order to increase efficiency in managing condition    Time 3   Period Weeks   Status Achieved   PT SHORT TERM GOAL #4   Title Patient to be independent in correctly and consistently performing appropriate HEP, to be expanded PRN    Time 3   Period Weeks   Status Achieved           PT Long Term Goals - 11/17/15 [id number]    PT LONG TERM GOAL #1   Title Patient to demonstrate strength 5/5 in all tested muscle groups in order to reduce pain, enhance regional stability, and improve overall mechanics   Baseline 5/30- improving but goal still unmet    Time 6   Period Weeks   Status On-going   PT LONG TERM GOAL #2   Title Patient to be able to ambulate 1340ft during 6MWT in order to demosntrate readiness to return to safe community based activities and mobility    Baseline 5/30- 1386ft   Time 6   Period Weeks   Status Achieved   PT LONG TERM GOAL #3   Title Patient to be able to perform reciprocal stair ascent and descent with no unsteadiness, good eccentric control, and pain L knee no more than 1/10 in order to enhance independence and safety with community moblity    Baseline 5/30- continues to have impaired eccentric control, difficulty with descent on 7 inch stairs, increased L knee pain    Time 6   Period Weeks   Status On-going   PT LONG TERM GOAL #4   Title Patient to report he has been able to complete all yardwork around his home, and has been able to return to light golfing activities with L knee pain no more than 1/10 in order to assist in returning to PLOF and improving QOL    Baseline 5/30- has been doing yardwork in steps, has  not tried golf    Time 6   Period Weeks   Status On-going  Plan - 12/03/15 0827    Clinical Impression Statement Session focus on improving functional strengthening with focus on improving knee mechanics to reduce c/o grinding.  Pt reports he noticed decreased grinding during exercises with cueing to reduce genu valgus though no reports of reduced grinding with functional tasks, plans on making apt with MD next week.  Pt able to complete open chain exercises with no reports of grinding, continues to c/o with [facility].  Evaluation therapist aware of no change with grinding, plan for reassessment next session.     Rehab Potential Good   PT Frequency 2x / week   PT Duration 3 weeks   PT Treatment/Interventions ADLs/Self Care Home Management;Cryotherapy;Gait training;Stair training;Functional mobility training;Therapeutic activities;Therapeutic exercise;Balance training;Neuromuscular re-education;Patient/family education;Manual techniques;Scar mobilization;Passive range of motion;Taping   PT Next Visit Plan Reassess next session      Patient will benefit from skilled therapeutic intervention in order to improve the following deficits and impairments:  Abnormal gait, Hypomobility, Decreased skin integrity, Decreased scar mobility, Increased edema, Decreased strength, Pain, Difficulty walking, Decreased mobility, Increased muscle spasms, Improper body mechanics, Decreased range of motion, Impaired flexibility  Visit Diagnosis: Stiffness of left knee, not elsewhere classified  Localized edema  Muscle weakness (generalized)  Difficulty in walking, not elsewhere classified  Pain in left knee     Problem List Patient Active Problem List   Diagnosis Date Noted  . Nocturnal leg cramps 09/26/2014  . Foot drop, right 09/24/2014  . 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 Upper airway cough syndrome 09/10/2014  . Aortic valve disorder 11/19/2013  . Essential  hypertension 11/19/2013  . Esophageal reflux 11/19/2013  . Gout 11/19/2013  . Aortic root enlargement [facility]) 11/19/2013   Ihor Austin, LPTA; [place]  Aldona Lento 12/03/2015, 8:57 AM  [place], [place], [address] Phone: [phone]   Fax:  [phone]  Name: Marvin Munoz MRN: [id number] Date of Birth: August 23, 1960

## 2015-12-08 ENCOUNTER — Ambulatory Visit (HOSPITAL_COMMUNITY): Payer: 59 | Admitting: Physical Therapy

## 2015-12-08 ENCOUNTER — Telehealth (HOSPITAL_COMMUNITY): Payer: Self-pay | Admitting: Physical Therapy

## 2015-12-08 DIAGNOSIS — R262 Difficulty in walking, not elsewhere classified: Secondary | ICD-10-CM

## 2015-12-08 DIAGNOSIS — M6281 Muscle weakness (generalized): Secondary | ICD-10-CM

## 2015-12-08 DIAGNOSIS — M25662 Stiffness of left knee, not elsewhere classified: Secondary | ICD-10-CM | POA: Diagnosis not present

## 2015-12-08 DIAGNOSIS — M25562 Pain in left knee: Secondary | ICD-10-CM

## 2015-12-08 DIAGNOSIS — R600 Localized edema: Secondary | ICD-10-CM

## 2015-12-08 NOTE — Therapy (Signed)
[place], [place], [address] Phone: [phone]   Fax:  [phone]  Physical Therapy Treatment (Discharge)  Patient Details  Name: Marvin Munoz MRN: [id number] Date of Birth: 1961/01/24 Referring Provider: Marchia Bond   Encounter Date: 12/08/2015      PT End of Session - 12/08/15 0934    Visit Number 18   Number of Visits 18   Authorization Type [employer]    Authorization Time Period 12/18/14 to 0/10/93; recert done on 2/35-57/32/2025   PT Start Time 0901   PT Stop Time 0931   PT Time Calculation (min) 30 min   Activity Tolerance Patient tolerated treatment well   Behavior During Therapy [facility] for tasks assessed/performed      Past Medical History  Diagnosis Date  . Aortic insufficiency due to bicuspid aortic valve   . HTN (hypertension)   . Gastroesophageal reflux   . PVC's (premature ventricular contractions)     hx of benign  . Fever blister   . Migraine headache   . Bursitis of elbow     gouty olecranon, right elbow  . Guillain-Barre syndrome ([place])   . Allergic rhinitis   . BPH (benign prostatic hyperplasia)   . Eczema     forearm  . Restless legs syndrome (RLS)   . Foot drop, right 09/24/2014  . 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 Guillain Barr syndrome ([place]) 1970's  . Guillain Barr syndrome ([place])   . Nocturnal leg cramps 09/26/2014    Past Surgical History  Procedure Laterality Date  . Breast lumpectomy Right   . Tonsillectomy    . Cardiac catheterization    . Colonoscopy      There were no vitals filed for this visit.      Subjective Assessment - 12/08/15 0903    Subjective Patient reports that the grinding is still present, and he feels like his knee is still getting aggravated. He states that he is no longer taking his pain medicine, and has not for at least 10 days. He is going to MD tomorrow to see exactly if there is anything that needs to be done. However he  reports in terms of PT he is really feeling quite good functionally.  No falls or close calls recently but he states that yesterday his knee actually locked on him a little bit.    Pertinent History HTN, gout, foot drop R, history of GBS, history of cardiac cath     How long can you sit comfortably? 6/20- unlimited    How long can you stand comfortably? 6/20- 30-40 minutes    How long can you walk comfortably? 6/20- 1/2 mile before knee becomes aggravated    Patient Stated Goals get knee back to where it was, loosen it up a bit, get out to do yardwork and golf    Currently in Pain? Yes   Pain Score 3    Pain Location Knee   Pain Orientation Left   Pain Descriptors / Indicators Aching;Dull   Pain Type Surgical pain   Pain Radiating Towards none    Pain Onset More than a month ago   Pain Frequency Constant   Aggravating Factors  being up on feet or walking for too long    Pain Relieving Factors getting weight off of it, ice    Effect of Pain on Daily Activities still not back at work, limited with weight bearing  [facility] PT Assessment - 12/08/15 0001    Observation/Other Assessments   Focus on Therapeutic Outcomes (FOTO)  36% limited    AROM   Left Knee Extension -1   Left Knee Flexion 124   Strength   Right Hip Flexion 5/5   Right Hip Extension 4+/5   Right Hip ABduction 5/5   Left Hip Flexion 5/5   Left Hip Extension 4+/5   Left Hip ABduction 5/5   Right Knee Flexion 4+/5   Right Knee Extension 4+/5   Left Knee Flexion 4+/5   Left Knee Extension 4+/5   Right Ankle Dorsiflexion 5/5   Left Ankle Dorsiflexion 5/5   6 minute walk test results    Aerobic Endurance Distance Walked 1469   Endurance additional comments 6MWT    Timed Up and Go Test   Normal TUG (seconds) 7.11                             PT Education - 12/08/15 0933    Education provided Yes   Education Details DC today; if need more PT due to acute condition, will need new MD  order; keep up with advanced exercises and gait    Person(s) Educated Patient   Methods Explanation   Comprehension Verbalized understanding          PT Short Term Goals - 12/08/15 0926    PT SHORT TERM GOAL #1   Title Patient to demonstrate L knee ROM of 0-115 degrees in order to improve gait and overall mobility, reduce pain    Baseline 6/20- -1 to 124   Time 3   Period Weeks   Status Achieved   PT SHORT TERM GOAL #2   Title Patient to demosntrate improved gait mechanics, including consistent heel-toe, no hip hiking or unsteadiness, equal step/stance each side, and improved L knee mobility during gait in order to enhance efficiency of gait over both even and unven surfaces with L knee pain no more than 1/10    Baseline 6/20- sometimes shifts away from pain in L knee however much improved    Time 3   Period Weeks   Status Achieved   PT SHORT TERM GOAL #3   Title Patient to be independent in correctly applying ice, performing retrograde massage, and performing appropraite exercises to manage edema and pain in order to increase efficiency in managing condition    Time 3   Period Weeks   Status Achieved   PT SHORT TERM GOAL #4   Title Patient to be independent in correctly and consistently performing appropriate HEP, to be expanded PRN    Time 3   Period Weeks   Status Achieved           PT Long Term Goals - 12/08/15 6301    PT LONG TERM GOAL #1   Title Patient to demonstrate strength 5/5 in all tested muscle groups in order to reduce pain, enhance regional stability, and improve overall mechanics   Time 6   Period Weeks   Status Partially Met   PT LONG TERM GOAL #2   Title Patient to be able to ambulate 1371f during 6MWT in order to demosntrate readiness to return to safe community based activities and mobility    Time 6   Period Weeks   Status Achieved   PT LONG TERM GOAL #3   Title Patient to be able to perform reciprocal stair ascent and descent with no  unsteadiness, good eccentric control, and pain L knee no more than 1/10 in order to enhance independence and safety with community moblity    Time 6   Period Weeks   Status Achieved   PT LONG TERM GOAL #4   Title Patient to report he has been able to complete all yardwork around his home, and has been able to return to light golfing activities with L knee pain no more than 1/10 in order to assist in returning to PLOF and improving QOL    Baseline 6/20- has returned to yard work, MD is restricting from full ROM with golf for now    Time 6   Period Weeks   Status Partially Met               Plan - 12/08/15 0935    Clinical Impression Statement Re-assessment performed today. Patient reports that he really is doing quite well except for the grinding feeling in his knee, and he even reports that his knee actually locked up the other day; advised patient to report this to MD at appointment tomorrow. Upon examination, patient is doing very well in all aspects, including ROM, strength, gait and stair pattern, and balance; he is no longer in need of skilled PT services for his current condition. Did, however educate patient that if MD decides to go back into his knee and wants him to have more PT following this, he will need new MD order to return to PT. DC today, no further skilled PT services warranted at this time.    Rehab Potential Good   PT Treatment/Interventions ADLs/Self Care Home Management;Cryotherapy;Gait training;Stair training;Functional mobility training;Therapeutic activities;Therapeutic exercise;Balance training;Neuromuscular re-education;Patient/family education;Manual techniques;Scar mobilization;Passive range of motion;Taping   PT Next Visit Plan DC today    Consulted and Agree with Plan of Care Patient      Patient will benefit from skilled therapeutic intervention in order to improve the following deficits and impairments:  Abnormal gait, Hypomobility, Decreased skin  integrity, Decreased scar mobility, Increased edema, Decreased strength, Pain, Difficulty walking, Decreased mobility, Increased muscle spasms, Improper body mechanics, Decreased range of motion, Impaired flexibility  Visit Diagnosis: Stiffness of left knee, not elsewhere classified  Localized edema  Muscle weakness (generalized)  Difficulty in walking, not elsewhere classified  Pain in left knee     Problem List Patient Active Problem List   Diagnosis Date Noted  . Nocturnal leg cramps 09/26/2014  . Foot drop, right 09/24/2014  . 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 Upper airway cough syndrome 09/10/2014  . Aortic valve disorder 11/19/2013  . Essential hypertension 11/19/2013  . Esophageal reflux 11/19/2013  . Gout 11/19/2013  . Aortic root enlargement ([place]) 11/19/2013    PHYSICAL THERAPY DISCHARGE SUMMARY  Visits from Start of Care: 18  Current functional level related to goals / functional outcomes: Patient doing extremely well with skilled PT services and is functionally ready for DC from skilled services at this point; his last remaining concern is the ongoing grinding in his knee, which he reports actually locked the other day and that he is going to MD tomorrow to follow up with.    Remaining deficits: Grinding/painful sensation L knee    Education / Equipment: Keep up with advanced exercises, DC today, will need new MD order if he needs to return to PT  Plan: Patient agrees to discharge.  Patient goals were met. Patient is being discharged due to meeting the stated rehab goals.  ?????  Deniece Ree PT, DPT [phone]  [facility] [address] [place], [place], [address] Phone: [phone]   Fax:  [phone]  Name: Marvin Munoz MRN: [id number] Date of Birth: Feb 14, 1961

## 2015-12-08 NOTE — Telephone Encounter (Signed)
Called patient to ask if he would be interested in local walking club through [facility]; patient declined interest  in club at this time.  Deniece Ree PT, DPT [phone]

## 2015-12-09 ENCOUNTER — Ambulatory Visit (HOSPITAL_COMMUNITY): Payer: 59 | Attending: Cardiovascular Disease

## 2015-12-09 ENCOUNTER — Other Ambulatory Visit: Payer: Self-pay

## 2015-12-09 DIAGNOSIS — I359 Nonrheumatic aortic valve disorder, unspecified: Secondary | ICD-10-CM | POA: Insufficient documentation

## 2015-12-09 DIAGNOSIS — I352 Nonrheumatic aortic (valve) stenosis with insufficiency: Secondary | ICD-10-CM | POA: Insufficient documentation

## 2015-12-09 DIAGNOSIS — I119 Hypertensive heart disease without heart failure: Secondary | ICD-10-CM | POA: Insufficient documentation

## 2015-12-09 DIAGNOSIS — I341 Nonrheumatic mitral (valve) prolapse: Secondary | ICD-10-CM | POA: Insufficient documentation

## 2015-12-09 DIAGNOSIS — I071 Rheumatic tricuspid insufficiency: Secondary | ICD-10-CM | POA: Diagnosis not present

## 2015-12-09 LAB — ECHOCARDIOGRAM COMPLETE
AOPV: 0.46 m/s
AV Area VTI index: 0.95 cm2/m2
AV Mean grad: 10 mmHg
AV Peak grad: 20 mmHg
AV area mean vel ind: 1.02 cm2/m2
AV peak Index: 0.9
AV pk vel: 226 cm/s
AVA: 2.22 cm2
AVAREAMEANV: 2.38 cm2
AVAREAVTI: 2.1 cm2
AVCELMEANRAT: 0.53
AVLVOTPG: 4 mmHg
Ao-asc: 39 cm
CHL CUP AV VALUE AREA INDEX: 0.95
CHL CUP AV VEL: 2.22
CHL CUP RV SYS PRESS: 29 mmHg
CHL CUP STROKE VOLUME: 65 mL
CHL CUP TV REG PEAK VELOCITY: 253 cm/s
DOP CAL AO MEAN VELOCITY: 142 cm/s
EERAT: 6.42
EWDT: 278 ms
FS: 29 % (ref 28–44)
IV/PV OW: 0.97
LA ID, A-P, ES: 37 mm
LA diam end sys: 37 mm
LA diam index: 1.59 cm/m2
LA vol: 72 mL
LAVOLA4C: 64 mL
LAVOLIN: 31 mL/m2
LDCA: 4.52 cm2
LV E/e' medial: 6.42
LV E/e'average: 6.42
LV PW d: 13.3 mm — AB (ref 0.6–1.1)
LV TDI E'LATERAL: 11
LV dias vol index: 48 mL/m2
LV sys vol index: 20 mL/m2
LV sys vol: 47 mL (ref 21–61)
LVDIAVOL: 112 mL (ref 62–150)
LVELAT: 11 cm/s
LVOT SV: 94 mL
LVOT VTI: 20.7 cm
LVOT diameter: 24 mm
LVOT peak VTI: 0.49 cm
LVOT peak vel: 105 cm/s
MV Dec: 278
MV pk A vel: 59.2 m/s
MV pk E vel: 70.6 m/s
P 1/2 time: 341 ms
RV LATERAL S' VELOCITY: 13.7 cm/s
Simpson's disk: 58
TDI e' medial: 7.02
TRMAXVEL: 253 cm/s
VTI: 42.1 cm

## 2015-12-15 ENCOUNTER — Encounter (HOSPITAL_COMMUNITY): Payer: 59

## 2015-12-17 ENCOUNTER — Encounter (HOSPITAL_COMMUNITY): Payer: 59

## 2016-01-14 ENCOUNTER — Encounter: Payer: Self-pay | Admitting: Interventional Cardiology

## 2016-02-02 ENCOUNTER — Encounter: Payer: Self-pay | Admitting: Interventional Cardiology

## 2016-02-02 ENCOUNTER — Ambulatory Visit (INDEPENDENT_AMBULATORY_CARE_PROVIDER_SITE_OTHER): Payer: 59 | Admitting: Interventional Cardiology

## 2016-02-02 ENCOUNTER — Encounter (INDEPENDENT_AMBULATORY_CARE_PROVIDER_SITE_OTHER): Payer: Self-pay

## 2016-02-02 VITALS — BP 120/74 | HR 61 | Ht 75.5 in | Wt 207.8 lb

## 2016-02-02 DIAGNOSIS — I359 Nonrheumatic aortic valve disorder, unspecified: Secondary | ICD-10-CM | POA: Diagnosis not present

## 2016-02-02 DIAGNOSIS — I10 Essential (primary) hypertension: Secondary | ICD-10-CM

## 2016-02-02 DIAGNOSIS — I7789 Other specified disorders of arteries and arterioles: Secondary | ICD-10-CM

## 2016-02-02 NOTE — Progress Notes (Signed)
Cardiology Office Note    Date:  02/02/2016   ID:  Marvin, Munoz Nov 30, 1960, MRN [id number]  PCP:  Henrine Screws, MD  Cardiologist: Sinclair Grooms, MD   Chief Complaint  Patient presents with  . Cardiac Valve Problem    Aortic valve    History of Present Illness:  Marvin Munoz is a 55 y.o. male follow-up bicuspid aortic valve, aortic regurgitation, and hypertension.  Asymptomatic. No syncope, swelling, orthopnea, PND, palpitations, or syncope. Had total knee replacement surgery on the left side without complications.    Past Medical History:  Diagnosis Date  . Allergic rhinitis   . Aortic insufficiency due to bicuspid aortic valve   . BPH (benign prostatic hyperplasia)   . Bursitis of elbow    gouty olecranon, right elbow  . Eczema    forearm  . Fever blister   . Foot drop, right 09/24/2014  . Gastroesophageal reflux   . Guillain Barr syndrome ([place]) 1970's  . Guillain Barr syndrome ([place])   . Guillain-Barre syndrome ([place])   . HTN (hypertension)   . Migraine headache   . 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 Nocturnal leg cramps 09/26/2014  . PVC's (premature ventricular contractions)    hx of benign  . Restless legs syndrome (RLS)     Past Surgical History:  Procedure Laterality Date  . BREAST LUMPECTOMY Right   . CARDIAC CATHETERIZATION    . COLONOSCOPY    . TONSILLECTOMY      Current Medications: Outpatient Medications Prior to Visit  Medication Sig Dispense Refill  . allopurinol (ZYLOPRIM) 300 MG tablet Take 300 mg by mouth daily.     Marland Kitchen aspirin 81 MG tablet Take 81 mg by mouth daily.    . Calcium Carb-Cholecalciferol (CALCIUM + D3) 600-200 MG-UNIT TABS Take by mouth 2 (two) times daily.    . chlorpheniramine (CHLOR-TRIMETON) 4 MG tablet Take 4 mg by mouth at bedtime.    Marland Kitchen diltiazem (CARDIZEM CD) 240 MG 24 hr capsule Take 240 mg by mouth daily.     . fexofenadine (ALLEGRA) 180 MG tablet Take 180 mg by mouth daily.    .  meloxicam (MOBIC) 7.5 MG tablet Take 7.5 mg by mouth daily as needed (back pain).     . Multiple Vitamin (MULTIVITAMIN) tablet Take 1 tablet by mouth daily.    Marland Kitchen omeprazole (PRILOSEC) 40 MG capsule Take 40 mg by mouth daily.     . psyllium (METAMUCIL) 58.6 % packet Take 1 packet by mouth daily.    . rizatriptan (MAXALT-MLT) 10 MG disintegrating tablet Take 10 mg by mouth as needed for migraine. May repeat in 2 hours if needed    . valACYclovir (VALTREX) 1000 MG tablet Take 500 mg by mouth as needed (cold sores).     . famotidine (PEPCID) 20 MG tablet Take 20 mg by mouth at bedtime.    . pramipexole (MIRAPEX) 0.125 MG tablet Take 0.125 mg by mouth at bedtime.     No facility-administered medications prior to visit.      Allergies:   Colchicine and Influenza vaccines   Social History   Social History  . Marital status: Married    Spouse name: N/A  . Number of children: 2  . Years of education: college   Occupational History  . Supervisor, Control and instrumentation engineer    Social History Main Topics  . Smoking status: Never Smoker  . Smokeless tobacco: Never Used  . Alcohol use  No     Comment: occasional  . Drug use: No  . Sexual activity: Yes   Other Topics Concern  . None   Social History Narrative   Patient is right handed.   Patient does not drink caffeine.     Family History:  The patient's family history includes Arthritis/Rheumatoid in his mother; Asthma in his mother; COPD in his mother; Heart attack in his father; Leukemia in his maternal grandfather; Prostate cancer in his father, maternal grandfather, and maternal uncle.   ROS:   Please see the history of present illness.    Occasionally awakens with dyspnea in the middle of the night. Occasional palpitations. Easy bruising.  All other systems reviewed and are negative.   PHYSICAL EXAM:   VS:  BP 120/74   Pulse 61   Ht 6' 3.5" (1.918 m)   Wt 207 lb 12.8 oz (94.3 kg)   BMI 25.63 kg/m    GEN: Well nourished, well  developed, in no acute distress  HEENT: normal  Neck: no JVD, carotid bruits, or masses Cardiac: RRR; Soft 1/6 crescendo decrescendo systolic murmur, and barely audible 1/6 decrescendo murmur of aortic regurgitation. No rubs, or gallops,no edema  Respiratory:  clear to auscultation bilaterally, normal work of breathing GI: soft, nontender, nondistended, + BS MS: no deformity or atrophy  Skin: warm and dry, no rash Neuro:  Alert and Oriented x 3, Strength and sensation are intact Psych: euthymic mood, full affect  Wt Readings from Last 3 Encounters:  02/02/16 207 lb 12.8 oz (94.3 kg)  08/14/15 221 lb 8 oz (100.5 kg)  02/11/15 217 lb 8 oz (98.7 kg)      Studies/Labs Reviewed:   EKG:  EKG  Normal sinus rhythm and normal tracing.  Recent Labs: No results found for requested labs within last 8760 hours.   Lipid Panel No results found for: CHOL, TRIG, HDL, CHOLHDL, VLDL, LDLCALC, LDLDIRECT  Additional studies/ records that were reviewed today include:   Echocardiogram, 11/2015 ------------------------------------------------------------------- Study Conclusions  - Left ventricle: The cavity size was normal. There was mild   concentric hypertrophy. Systolic function was normal. The   estimated ejection fraction was in the range of 55% to 60%. Wall   motion was normal; there were no regional wall motion   abnormalities. Features are consistent with a pseudonormal left   ventricular filling pattern, with concomitant abnormal relaxation   and increased filling pressure (grade 2 diastolic dysfunction).   Doppler parameters are consistent with indeterminate ventricular   filling pressure. - Aortic valve: Possibly bicuspid; moderately thickened, moderately   calcified leaflets. There was mild stenosis. There was moderate   to severe regurgitation. Severe regurgitation is suggested by   holodiastolic flow reversal in the descending aorta. Peak   velocity (S): 226 cm/s. Mean gradient  (S): 10 mm Hg.   Regurgitation pressure half-time: 341 ms. - Mitral valve: Prolapse, involving the anterior leaflet. There was   no regurgitation. - Left atrium: The atrium was mildly dilated. - Right ventricle: The cavity size was normal. Wall thickness was   normal. Systolic function was normal. - Atrial septum: No defect or patent foramen ovale was identified. - Tricuspid valve: There was trivial regurgitation.   ASSESSMENT:    1. Aortic valve disease   2. Essential hypertension   3. Aortic root enlargement (HCC)      PLAN:  In order of problems listed above:  1. Repeat echo in 1 year to reassess aortic valve. 2. Well controlled at  this point. 3. No evidence of aortic root enlargement by the recent echo.    Medication Adjustments/Labs and Tests Ordered: Current medicines are reviewed at length with the patient today.  Concerns regarding medicines are outlined above.  Medication changes, Labs and Tests ordered today are listed in the Patient Instructions below. There are no Patient Instructions on file for this visit.   Signed, Sinclair Grooms, MD  02/02/2016 2:42 PM    [facility] [place], [place], [place]  [address] Phone: [phone]; Fax: [phone]

## 2016-02-02 NOTE — Patient Instructions (Signed)
Medication Instructions:  Your physician recommends that you continue on your current medications as directed. Please refer to the Current Medication list given to you today.   Labwork: None ordered  Testing/Procedures: Your physician has requested that you have an echocardiogram. Echocardiography is a painless test that uses sound waves to create images of your heart. It provides your doctor with information about the size and shape of your heart and how well your heart's chambers and valves are working. This procedure takes approximately one hour. There are no restrictions for this procedure. (To be scheduled prior to next office visit)  Follow-Up: Your physician wants you to follow-up in: 1 year with Dr.Smith You will receive a reminder letter in the mail two months in advance. If you don't receive a letter, please call our office to schedule the follow-up appointment.   Any Other Special Instructions Will Be Listed Below (If Applicable).     If you need a refill on your cardiac medications before your next appointment, please call your pharmacy.

## 2016-05-24 NOTE — H&P (Signed)
Marvin Munoz comes in for evaluation and treatment recommendation for his right elbow.  Acute on chronic steadily worsening pain in the lateral aspect of his right elbow.  Consistent with lateral epicondylitis.  This has reached a point of marked significant symptoms, not improving.  Recently seen and evaluated by Dr. Layne Benton.  He had one shot which helped for awhile, but as soon as he started using the elbow his pain came back.  He has used a [employer] brace.  He is doing a stretching program.  His recent MRI shows a near complete tear of the common extensor tendon with muscle edema.  There is also a tear of the proximal end of the lateral collateral ligament, although I am not sure how much that results in instability.  All of his pain is with use and no feeling of instability.  I have looked at his x-rays, his scan and his report and discussed it all with him.   History and general exam are reviewed.   EXAMINATION: Specifically, he can still get through full motion.  Exquisitely tender extensor tendon origin lateral epicondyle.  Give way weakness.  He does not feel unstable when you test him laterally.  Really nothing on the medial side.  Biceps and triceps intact.  Neurovascularly intact distally.    DISPOSITION:  This needs to be repaired and he understands that.  We have discussed what is involved.  Exam under anesthesia.  Open intervention laterally.  His outcome and how much we are going to have to protect him afterwards is really going to depend on what I find.  If he truly has a complete avulsion of the entire extensor tendon mass this is going to have to be repaired with an anchor and brace for six weeks before we can go with rehab.  I will look at his lateral collateral ligament complex, repair and reattach that as well if that is an issue.  What is involved operative and postoperative reviewed.  Paperwork complete.  All questions answered.  More than 25 minutes spent face-to-face covering all of  this with him.  He understands.  I will see him at the time of operative intervention.

## 2016-05-27 ENCOUNTER — Encounter (HOSPITAL_BASED_OUTPATIENT_CLINIC_OR_DEPARTMENT_OTHER): Payer: Self-pay | Admitting: *Deleted

## 2016-06-02 ENCOUNTER — Ambulatory Visit (HOSPITAL_BASED_OUTPATIENT_CLINIC_OR_DEPARTMENT_OTHER): Payer: 59 | Admitting: Anesthesiology

## 2016-06-02 ENCOUNTER — Encounter (HOSPITAL_BASED_OUTPATIENT_CLINIC_OR_DEPARTMENT_OTHER)
Admission: RE | Disposition: A | Discharge status: Home / Self Care | Payer: Self-pay | Source: Ambulatory Visit | Attending: Orthopedic Surgery

## 2016-06-02 ENCOUNTER — Encounter (HOSPITAL_BASED_OUTPATIENT_CLINIC_OR_DEPARTMENT_OTHER): Payer: Self-pay | Admitting: Anesthesiology

## 2016-06-02 ENCOUNTER — Ambulatory Visit (HOSPITAL_BASED_OUTPATIENT_CLINIC_OR_DEPARTMENT_OTHER)
Admission: RE | Admit: 2016-06-02 | Discharge: 2016-06-02 | Disposition: A | Discharge status: Home / Self Care | Payer: 59 | Source: Ambulatory Visit | Attending: Orthopedic Surgery | Admitting: Orthopedic Surgery

## 2016-06-02 DIAGNOSIS — S53401A Unspecified sprain of right elbow, initial encounter: Secondary | ICD-10-CM | POA: Insufficient documentation

## 2016-06-02 DIAGNOSIS — I10 Essential (primary) hypertension: Secondary | ICD-10-CM | POA: Diagnosis not present

## 2016-06-02 DIAGNOSIS — M25521 Pain in right elbow: Secondary | ICD-10-CM | POA: Diagnosis present

## 2016-06-02 DIAGNOSIS — K219 Gastro-esophageal reflux disease without esophagitis: Secondary | ICD-10-CM | POA: Diagnosis not present

## 2016-06-02 DIAGNOSIS — M7711 Lateral epicondylitis, right elbow: Secondary | ICD-10-CM | POA: Insufficient documentation

## 2016-06-02 DIAGNOSIS — I082 Rheumatic disorders of both aortic and tricuspid valves: Secondary | ICD-10-CM | POA: Insufficient documentation

## 2016-06-02 DIAGNOSIS — S56511A Strain of other extensor muscle, fascia and tendon at forearm level, right arm, initial encounter: Secondary | ICD-10-CM | POA: Diagnosis not present

## 2016-06-02 DIAGNOSIS — X58XXXA Exposure to other specified factors, initial encounter: Secondary | ICD-10-CM | POA: Insufficient documentation

## 2016-06-02 HISTORY — DX: Gout, unspecified: M10.9

## 2016-06-02 HISTORY — PX: TENDON RECONSTRUCTION: SHX2487

## 2016-06-02 SURGERY — RECONSTRUCTION, TENDON OR LIGAMENT, ELBOW
Anesthesia: General | Site: Elbow | Laterality: Right

## 2016-06-02 MED ORDER — FENTANYL CITRATE (PF) 100 MCG/2ML IJ SOLN
INTRAMUSCULAR | Status: AC
  Filled 2016-06-02: qty 2

## 2016-06-02 MED ORDER — FENTANYL CITRATE (PF) 100 MCG/2ML IJ SOLN
25.0000 ug | INTRAMUSCULAR | Status: DC | PRN
  Administered 2016-06-02 (×2): 25 ug via INTRAVENOUS
  Administered 2016-06-02: 50 ug via INTRAVENOUS

## 2016-06-02 MED ORDER — METOCLOPRAMIDE HCL 5 MG PO TABS: 5.0000 mg | ORAL_TABLET | Freq: Three times a day (TID) | ORAL | Status: DC | PRN

## 2016-06-02 MED ORDER — ONDANSETRON HCL 4 MG/2ML IJ SOLN
INTRAMUSCULAR | Status: DC | PRN
  Administered 2016-06-02: 4 mg via INTRAVENOUS

## 2016-06-02 MED ORDER — SCOPOLAMINE 1 MG/3DAYS TD PT72: 1.0000 | MEDICATED_PATCH | Freq: Once | TRANSDERMAL | Status: DC | PRN

## 2016-06-02 MED ORDER — PROMETHAZINE HCL 25 MG/ML IJ SOLN: 6.2500 mg | INTRAMUSCULAR | Status: DC | PRN

## 2016-06-02 MED ORDER — METHOCARBAMOL 500 MG PO TABS: 500.0000 mg | ORAL_TABLET | Freq: Four times a day (QID) | ORAL | Status: DC | PRN

## 2016-06-02 MED ORDER — PROPOFOL 10 MG/ML IV BOLUS
INTRAVENOUS | Status: DC | PRN
  Administered 2016-06-02: 200 mg via INTRAVENOUS

## 2016-06-02 MED ORDER — SUCCINYLCHOLINE CHLORIDE 200 MG/10ML IV SOSY
PREFILLED_SYRINGE | INTRAVENOUS | Status: AC
Start: 2016-06-02 — End: 2016-06-02
  Filled 2016-06-02: qty 10

## 2016-06-02 MED ORDER — BUPIVACAINE HCL (PF) 0.5 % IJ SOLN
INTRAMUSCULAR | Status: DC | PRN
  Administered 2016-06-02: 10 mL

## 2016-06-02 MED ORDER — BUPIVACAINE HCL (PF) 0.5 % IJ SOLN
INTRAMUSCULAR | Status: AC
Start: 2016-06-02 — End: 2016-06-02
  Filled 2016-06-02: qty 60

## 2016-06-02 MED ORDER — OXYCODONE-ACETAMINOPHEN 5-325 MG PO TABS: 1.0000 | ORAL_TABLET | ORAL | Status: DC | PRN

## 2016-06-02 MED ORDER — CEFAZOLIN SODIUM-DEXTROSE 2-4 GM/100ML-% IV SOLN
INTRAVENOUS | Status: AC
  Filled 2016-06-02: qty 100

## 2016-06-02 MED ORDER — DEXAMETHASONE SODIUM PHOSPHATE 4 MG/ML IJ SOLN
INTRAMUSCULAR | Status: DC | PRN
  Administered 2016-06-02: 10 mg via INTRAVENOUS

## 2016-06-02 MED ORDER — METHOCARBAMOL 1000 MG/10ML IJ SOLN: 500.0000 mg | Freq: Four times a day (QID) | INTRAMUSCULAR | Status: DC | PRN

## 2016-06-02 MED ORDER — MIDAZOLAM HCL 2 MG/2ML IJ SOLN
INTRAMUSCULAR | Status: AC
  Filled 2016-06-02: qty 2

## 2016-06-02 MED ORDER — EPHEDRINE SULFATE 50 MG/ML IJ SOLN
INTRAMUSCULAR | Status: DC | PRN
  Administered 2016-06-02: 15 mg via INTRAVENOUS
  Administered 2016-06-02: 10 mg via INTRAVENOUS

## 2016-06-02 MED ORDER — LIDOCAINE 2% (20 MG/ML) 5 ML SYRINGE
INTRAMUSCULAR | Status: AC
  Filled 2016-06-02: qty 5

## 2016-06-02 MED ORDER — HYDROMORPHONE HCL 1 MG/ML IJ SOLN
INTRAMUSCULAR | Status: AC
Start: 2016-06-02 — End: 2016-06-02
  Filled 2016-06-02: qty 1

## 2016-06-02 MED ORDER — ONDANSETRON HCL 4 MG PO TABS: 4.0000 mg | ORAL_TABLET | Freq: Four times a day (QID) | ORAL | Status: DC | PRN

## 2016-06-02 MED ORDER — HYDROMORPHONE HCL 1 MG/ML IJ SOLN: 0.5000 mg | INTRAMUSCULAR | Status: DC | PRN

## 2016-06-02 MED ORDER — CHLORHEXIDINE GLUCONATE 4 % EX LIQD: 60.0000 mL | Freq: Once | CUTANEOUS | Status: DC

## 2016-06-02 MED ORDER — HYDROMORPHONE HCL 1 MG/ML IJ SOLN
INTRAMUSCULAR | Status: AC
  Filled 2016-06-02: qty 1

## 2016-06-02 MED ORDER — FENTANYL CITRATE (PF) 100 MCG/2ML IJ SOLN
50.0000 ug | INTRAMUSCULAR | Status: DC | PRN
  Administered 2016-06-02: 100 ug via INTRAVENOUS

## 2016-06-02 MED ORDER — ONDANSETRON HCL 4 MG/2ML IJ SOLN
INTRAMUSCULAR | Status: AC
  Filled 2016-06-02: qty 2

## 2016-06-02 MED ORDER — METOCLOPRAMIDE HCL 5 MG/ML IJ SOLN: 5.0000 mg | Freq: Three times a day (TID) | INTRAMUSCULAR | Status: DC | PRN

## 2016-06-02 MED ORDER — LIDOCAINE HCL (CARDIAC) 20 MG/ML IV SOLN
INTRAVENOUS | Status: DC | PRN
  Administered 2016-06-02: 50 mg via INTRAVENOUS

## 2016-06-02 MED ORDER — LACTATED RINGERS IV SOLN
INTRAVENOUS | Status: DC
  Administered 2016-06-02: 08:00:00 via INTRAVENOUS

## 2016-06-02 MED ORDER — HYDROMORPHONE HCL 1 MG/ML IJ SOLN
0.2500 mg | INTRAMUSCULAR | Status: DC | PRN
  Administered 2016-06-02 (×4): 0.5 mg via INTRAVENOUS

## 2016-06-02 MED ORDER — ONDANSETRON HCL 4 MG/2ML IJ SOLN: 4.0000 mg | Freq: Four times a day (QID) | INTRAMUSCULAR | Status: DC | PRN

## 2016-06-02 MED ORDER — DEXAMETHASONE SODIUM PHOSPHATE 10 MG/ML IJ SOLN
INTRAMUSCULAR | Status: AC
  Filled 2016-06-02: qty 1

## 2016-06-02 MED ORDER — PROPOFOL 500 MG/50ML IV EMUL
INTRAVENOUS | Status: AC
  Filled 2016-06-02: qty 50

## 2016-06-02 MED ORDER — OXYCODONE-ACETAMINOPHEN 5-325 MG PO TABS
1.0000 | ORAL_TABLET | ORAL | 0 refills | Status: DC | PRN
Start: 2016-06-02 — End: 2017-02-06

## 2016-06-02 MED ORDER — MIDAZOLAM HCL 2 MG/2ML IJ SOLN
1.0000 mg | INTRAMUSCULAR | Status: DC | PRN
  Administered 2016-06-02: 2 mg via INTRAVENOUS

## 2016-06-02 MED ORDER — ROCURONIUM BROMIDE 10 MG/ML (PF) SYRINGE
PREFILLED_SYRINGE | INTRAVENOUS | Status: AC
  Filled 2016-06-02: qty 10

## 2016-06-02 MED ORDER — ONDANSETRON HCL 4 MG PO TABS: 4.0000 mg | ORAL_TABLET | Freq: Three times a day (TID) | ORAL | 0 refills | Status: DC | PRN

## 2016-06-02 MED ORDER — CEFAZOLIN SODIUM-DEXTROSE 2-4 GM/100ML-% IV SOLN
2.0000 g | INTRAVENOUS | Status: AC
  Administered 2016-06-02: 2 g via INTRAVENOUS

## 2016-06-02 SURGICAL SUPPLY — 70 items
ANCHOR SUTURETAK 2.4X8.5 MINI (Anchor) ×3 IMPLANT
BANDAGE ACE 4X5 VEL STRL LF (GAUZE/BANDAGES/DRESSINGS) ×6 IMPLANT
BENZOIN TINCTURE PRP APPL 2/3 (GAUZE/BANDAGES/DRESSINGS) ×3 IMPLANT
BLADE SURG 15 STRL LF DISP TIS (BLADE) ×1 IMPLANT
BLADE SURG 15 STRL SS (BLADE) ×2
BNDG COHESIVE 4X5 TAN STRL (GAUZE/BANDAGES/DRESSINGS) ×3 IMPLANT
BNDG ESMARK 4X9 LF (GAUZE/BANDAGES/DRESSINGS) ×3 IMPLANT
CANISTER SUCT 1200ML W/VALVE (MISCELLANEOUS) ×3 IMPLANT
CLOSURE WOUND 1/2 X4 (GAUZE/BANDAGES/DRESSINGS) ×1
COVER BACK TABLE 60X90IN (DRAPES) ×3 IMPLANT
CUFF TOURN SGL LL 18 NRW (TOURNIQUET CUFF) ×3 IMPLANT
CUFF TOURNIQUET SINGLE 18IN (TOURNIQUET CUFF) IMPLANT
DECANTER SPIKE VIAL GLASS SM (MISCELLANEOUS) ×3 IMPLANT
DRAPE EXTREMITY T 121X128X90 (DRAPE) ×3 IMPLANT
DRAPE IMP U-DRAPE 54X76 (DRAPES) ×3 IMPLANT
DRAPE SURG 17X23 STRL (DRAPES) ×3 IMPLANT
DRAPE U-SHAPE 47X51 STRL (DRAPES) IMPLANT
DRSG PAD ABDOMINAL 8X10 ST (GAUZE/BANDAGES/DRESSINGS) ×3 IMPLANT
DURAPREP 26ML APPLICATOR (WOUND CARE) ×3 IMPLANT
ELECT REM PT RETURN 9FT ADLT (ELECTROSURGICAL) ×3
ELECTRODE REM PT RTRN 9FT ADLT (ELECTROSURGICAL) ×1 IMPLANT
GAUZE SPONGE 4X4 12PLY STRL (GAUZE/BANDAGES/DRESSINGS) ×3 IMPLANT
GAUZE XEROFORM 1X8 LF (GAUZE/BANDAGES/DRESSINGS) IMPLANT
GLOVE BIOGEL PI IND STRL 7.0 (GLOVE) ×3 IMPLANT
GLOVE BIOGEL PI INDICATOR 7.0 (GLOVE) ×6
GLOVE ECLIPSE 6.5 STRL STRAW (GLOVE) ×3 IMPLANT
GLOVE ECLIPSE 7.0 STRL STRAW (GLOVE) IMPLANT
GLOVE SURG ORTHO 8.0 STRL STRW (GLOVE) ×3 IMPLANT
GOWN STRL REUS W/ TWL LRG LVL3 (GOWN DISPOSABLE) ×1 IMPLANT
GOWN STRL REUS W/ TWL XL LVL3 (GOWN DISPOSABLE) ×2 IMPLANT
GOWN STRL REUS W/TWL LRG LVL3 (GOWN DISPOSABLE) ×2
GOWN STRL REUS W/TWL XL LVL3 (GOWN DISPOSABLE) ×4 IMPLANT
KIT MINI BIO ANCHOR DRILL (KITS) ×3 IMPLANT
KIT SUTURETAK 3 SPEAR TROCAR (KITS) IMPLANT
NEEDLE 1/2 CIR CATGUT .05X1.09 (NEEDLE) IMPLANT
NEEDLE HYPO 25X1 1.5 SAFETY (NEEDLE) ×3 IMPLANT
NS IRRIG 1000ML POUR BTL (IV SOLUTION) ×3 IMPLANT
PACK BASIN DAY SURGERY FS (CUSTOM PROCEDURE TRAY) ×3 IMPLANT
PAD CAST 4YDX4 CTTN HI CHSV (CAST SUPPLIES) ×3 IMPLANT
PADDING CAST ABS 4INX4YD NS (CAST SUPPLIES) ×4
PADDING CAST ABS COTTON 4X4 ST (CAST SUPPLIES) ×2 IMPLANT
PADDING CAST COTTON 4X4 STRL (CAST SUPPLIES) ×6
PENCIL BUTTON HOLSTER BLD 10FT (ELECTRODE) ×3 IMPLANT
SLEEVE SCD COMPRESS KNEE MED (MISCELLANEOUS) ×3 IMPLANT
SLING ARM FOAM STRAP LRG (SOFTGOODS) IMPLANT
SLING ARM FOAM STRAP XLG (SOFTGOODS) ×3 IMPLANT
SLING ARM MED ADULT FOAM STRAP (SOFTGOODS) IMPLANT
SPLINT FAST PLASTER 5X30 (CAST SUPPLIES) ×20
SPLINT PLASTER CAST FAST 5X30 (CAST SUPPLIES) ×10 IMPLANT
STOCKINETTE 4X48 STRL (DRAPES) ×3 IMPLANT
STRIP CLOSURE SKIN 1/2X4 (GAUZE/BANDAGES/DRESSINGS) ×2 IMPLANT
SUCTION FRAZIER HANDLE 10FR (MISCELLANEOUS) ×2
SUCTION TUBE FRAZIER 10FR DISP (MISCELLANEOUS) ×1 IMPLANT
SUT 2 FIBERLOOP 20 STRT BLUE (SUTURE)
SUT FIBERWIRE #2 38 T-5 BLUE (SUTURE) ×3
SUT VIC AB 0 CT1 27 (SUTURE)
SUT VIC AB 0 CT1 27XBRD ANBCTR (SUTURE) IMPLANT
SUT VIC AB 2-0 SH 27 (SUTURE)
SUT VIC AB 2-0 SH 27XBRD (SUTURE) IMPLANT
SUT VIC AB 3-0 SH 27 (SUTURE) ×2
SUT VIC AB 3-0 SH 27X BRD (SUTURE) ×1 IMPLANT
SUT VICRYL 3-0 CR8 SH (SUTURE) ×3 IMPLANT
SUTURE 2 FIBERLOOP 20 STRT BLU (SUTURE) IMPLANT
SUTURE FIBERWR #2 38 T-5 BLUE (SUTURE) ×1 IMPLANT
SYR BULB 3OZ (MISCELLANEOUS) ×3 IMPLANT
TOWEL OR 17X24 6PK STRL BLUE (TOWEL DISPOSABLE) ×3 IMPLANT
TOWEL OR NON WOVEN STRL DISP B (DISPOSABLE) ×3 IMPLANT
TUBE CONNECTING 20'X1/4 (TUBING) ×1
TUBE CONNECTING 20X1/4 (TUBING) ×2 IMPLANT
UNDERPAD 30X30 (UNDERPADS AND DIAPERS) ×3 IMPLANT

## 2016-06-02 NOTE — Discharge Instructions (Signed)
Do not remove splint.  Do not get splint wet.  May apply ice for up to 20 min at a time for pain and swelling.  Fu appt in one week.  Call your surgeon if you experience:   1.  Fever over 101.0. 2.  Inability to urinate. 3.  Nausea and/or vomiting. 4.  Extreme swelling or bruising at the surgical site. 5.  Continued bleeding from the incision. 6.  Increased pain, redness or drainage from the incision. 7.  Problems related to your pain medication. 8.  Any problems and/or concerns  Post Anesthesia Home Care Instructions  Activity: Get plenty of rest for the remainder of the day. A responsible adult should stay with you for 24 hours following the procedure.  For the next 24 hours, DO NOT: -Drive a car -Paediatric nurse -Drink alcoholic beverages -Take any medication unless instructed by your physician -Make any legal decisions or sign important papers.  Meals: Start with liquid foods such as gelatin or soup. Progress to regular foods as tolerated. Avoid greasy, spicy, heavy foods. If nausea and/or vomiting occur, drink only clear liquids until the nausea and/or vomiting subsides. Call your physician if vomiting continues.  Special Instructions/Symptoms: Your throat may feel dry or sore from the anesthesia or the breathing tube placed in your throat during surgery. If this causes discomfort, gargle with warm salt water. The discomfort should disappear within 24 hours.  If you had a scopolamine patch placed behind your ear for the management of post- operative nausea and/or vomiting:  1. The medication in the patch is effective for 72 hours, after which it should be removed.  Wrap patch in a tissue and discard in the trash. Wash hands thoroughly with soap and water. 2. You may remove the patch earlier than 72 hours if you experience unpleasant side effects which may include dry mouth, dizziness or visual disturbances. 3. Avoid touching the patch. Wash your hands with soap and water  after contact with the patch.

## 2016-06-02 NOTE — Anesthesia Postprocedure Evaluation (Signed)
Anesthesia Post Note  Patient: Marvin Munoz  Procedure(s) Performed: Procedure(s) (LRB): right elbow extensor tendon repair and debridement and repair lateral collateral ligament (Right)  Patient location during evaluation: PACU Anesthesia Type: General Level of consciousness: awake and alert Pain management: pain level controlled Vital Signs Assessment: post-procedure vital signs reviewed and stable Respiratory status: spontaneous breathing, nonlabored ventilation, respiratory function stable and patient connected to nasal cannula oxygen Cardiovascular status: blood pressure returned to baseline and stable Postop Assessment: no signs of nausea or vomiting Anesthetic complications: no    Last Vitals:  Vitals:   06/02/16 1134 06/02/16 1159  BP:  123/77  Pulse: 78 69  Resp: 19 18  Temp:  36.6 C    Last Pain:  Vitals:   06/02/16 1159  TempSrc: Oral  PainSc: 4                  Catalina Gravel

## 2016-06-02 NOTE — Anesthesia Preprocedure Evaluation (Addendum)
Anesthesia Evaluation  Patient identified by MRN, date of birth, ID band Patient awake    Reviewed: Allergy & Precautions, NPO status , Patient's Chart, lab work & pertinent test results  Airway Mallampati: II  TM Distance: >3 FB Neck ROM: Full    Dental  (+) Teeth Intact, Dental Advisory Given   Pulmonary neg pulmonary ROS,    Pulmonary exam normal breath sounds clear to auscultation       Cardiovascular hypertension, Pt. on medications (-) angina(-) CAD, (-) Past MI and (-) CHF + Valvular Problems/Murmurs (bicuspid AV) AI  Rhythm:Regular Rate:Normal + Diastolic murmurs Echo [id number]: ------------------------------------------------------------------- Study Conclusions  - Left ventricle: The cavity size was normal. There was mild   concentric hypertrophy. Systolic function was normal. The   estimated ejection fraction was in the range of 55% to 60%. Wall   motion was normal; there were no regional wall motion   abnormalities. Features are consistent with a pseudonormal left   ventricular filling pattern, with concomitant abnormal relaxation   and increased filling pressure (grade 2 diastolic dysfunction).   Doppler parameters are consistent with indeterminate ventricular   filling pressure. - Aortic valve: Possibly bicuspid; moderately thickened, moderately   calcified leaflets. There was mild stenosis. There was moderate   to severe regurgitation. Severe regurgitation is suggested by   holodiastolic flow reversal in the descending aorta. Peak   velocity (S): 226 cm/s. Mean gradient (S): 10 mm Hg.   Regurgitation pressure half-time: 341 ms. - Mitral valve: Prolapse, involving the anterior leaflet. There was   no regurgitation. - Left atrium: The atrium was mildly dilated. - Right ventricle: The cavity size was normal. Wall thickness was   normal. Systolic function was normal. - Atrial septum: No defect or patent foramen  ovale was identified. - Tricuspid valve: There was trivial regurgitation.   Neuro/Psych  Headaches,  Neuromuscular disease (Right foot drop) negative psych ROS   GI/Hepatic Neg liver ROS, GERD  Medicated,  Endo/Other  negative endocrine ROS  Renal/GU negative Renal ROS     Musculoskeletal  (+) Arthritis ,   Abdominal   Peds  Hematology negative hematology ROS (+)   Anesthesia Other Findings Day of surgery medications reviewed with the patient.  Reproductive/Obstetrics                           Anesthesia Physical Anesthesia Plan  ASA: II  Anesthesia Plan: General   Post-op Pain Management:    Induction: Intravenous  Airway Management Planned: Oral ETT  Additional Equipment:   Intra-op Plan:   Post-operative Plan: Extubation in OR  Informed Consent: I have reviewed the patients History and Physical, chart, labs and discussed the procedure including the risks, benefits and alternatives for the proposed anesthesia with the patient or authorized representative who has indicated his/her understanding and acceptance.   Dental advisory given  Plan Discussed with: CRNA  Anesthesia Plan Comments: (Risks/benefits of general anesthesia discussed with patient including risk of damage to teeth, lips, gum, and tongue, nausea/vomiting, allergic reactions to medications, and the possibility of heart attack, stroke and death.  All patient questions answered.  Patient wishes to proceed.)       Anesthesia Quick Evaluation

## 2016-06-02 NOTE — Transfer of Care (Signed)
Immediate Anesthesia Transfer of Care Note  Patient: Marvin Munoz  Procedure(s) Performed: Procedure(s): right elbow extensor tendon repair and debridement and repair lateral collateral ligament (Right)  Patient Location: PACU  Anesthesia Type:General  Level of Consciousness: awake and sedated  Airway & Oxygen Therapy: Patient Spontanous Breathing and Patient connected to face mask oxygen  Post-op Assessment: Report given to RN and Post -op Vital signs reviewed and stable  Post vital signs: Reviewed and stable  Last Vitals:  Vitals:   06/02/16 0722  BP: 111/73  Pulse: 63  Resp: 16  Temp: 36.4 C    Last Pain:  Vitals:   06/02/16 0722  TempSrc: Oral         Complications: No apparent anesthesia complications

## 2016-06-02 NOTE — Interval H&P Note (Signed)
History and Physical Interval Note:  06/02/2016 7:27 AM  Marvin Munoz  has presented today for surgery, with the diagnosis of lateral epicondylitis, right elbow  The various methods of treatment have been discussed with the patient and family. After consideration of risks, benefits and other options for treatment, the patient has consented to  Procedure(s): right ELBOW TENDON repair and debridement (Right) as a surgical intervention .  The patient's history has been reviewed, patient examined, no change in status, stable for surgery.  I have reviewed the patient's chart and labs.  Questions were answered to the patient's satisfaction.     Ninetta Lights

## 2016-06-02 NOTE — Anesthesia Procedure Notes (Signed)
Procedure Name: LMA Insertion Performed by: Haji Delaine W Pre-anesthesia Checklist: Patient identified, Emergency Drugs available, Suction available and Patient being monitored Patient Re-evaluated:Patient Re-evaluated prior to inductionOxygen Delivery Method: Circle system utilized Preoxygenation: Pre-oxygenation with 100% oxygen Intubation Type: IV induction Ventilation: Mask ventilation without difficulty LMA: LMA inserted LMA Size: 5.0 Number of attempts: 1 Placement Confirmation: positive ETCO2 Tube secured with: Tape Dental Injury: Teeth and Oropharynx as per pre-operative assessment        

## 2016-06-03 ENCOUNTER — Encounter (HOSPITAL_BASED_OUTPATIENT_CLINIC_OR_DEPARTMENT_OTHER): Payer: Self-pay | Admitting: Orthopedic Surgery

## 2016-06-06 NOTE — Op Note (Signed)
NAME:  Marvin Munoz, Marvin Munoz                      ACCOUNT NO.:  MEDICAL RECORD NO.:  [id number]  LOCATION:                                 FACILITY:  PHYSICIAN:  Ninetta Lights, M.D. DATE OF BIRTH:  02/15/1961  DATE OF PROCEDURE:  06/02/2016 DATE OF DISCHARGE:                              OPERATIVE REPORT   PREOPERATIVE DIAGNOSES:  Right elbow extensive chronic lateral epicondylitis with probable full-thickness tearing extensor tendon as well as disruption humeral attachment of the lateral collateral ligament.  POSTOPERATIVE DIAGNOSES:  Right elbow extensive chronic lateral epicondylitis with probable full-thickness tearing extensor tendon as well as disruption humeral attachment of the lateral collateral ligament, with a functional full-thickness tearing of both the ligament and extensor tendon.  PROCEDURE:  Right elbow exploration, debridement, followed by primary repair of the extensor tendon origin with a 2.4 anchor and FiberWire suture.  Also, repair reattachment of the lateral collateral ligament with FiberWire associated with the anchor.  SURGEON:  Ninetta Lights, M.D.  ASSISTANT:  Elmyra Ricks, P.A., present throughout the entire case and necessary for timely completion of the procedure.  ANESTHESIA:  General.  BLOOD LOSS:  Minimal.  SPECIMENS:  None.  CULTURES:  None.  COMPLICATIONS:  None.  DRESSINGS:  Soft compressive with a long-arm splint.  TOURNIQUET TIME:  1 hour.  DESCRIPTION OF PROCEDURE:  The patient was brought to operating room and, after adequate anesthesia had been obtained, tourniquet was applied to the upper aspect of the right arm, and prepped and draped in usual sterile fashion.  Exsanguinated with elevation of Esmarch.  Tourniquet inflated to 250 mmHg.  Longitudinal incision from the lateral epicondyle distally.  Skin and subcutaneous tissue divided.  A very thin layer of nonfunctional extensor tendon was present at the epicondyle,  divided longitudinally; and there was a functional full-thickness tear of the entire extensor tendon origin.  Once this was opened, we immediately went into the elbow were fluid was removed.  No significant degenerative changes.  I was able to free up and obtain a proximal origin of the capsule and the lateral collateral ligament.  After I debrided back the extensor tendon to healthy tissue, I placed a 2.4 anchor in the epicondyle.  The FiberWire suture was used to weave, reattach the lateral collateral ligament, and the entire extensor tendon back firmly down to the epicondyle utilizing baseball stitch well distal and well proximal, and tied in place.  At completion, I had a good firm reattachment, full motion, good stability.  Wound was irrigated. Subcutaneous with subcuticular closure.  Margins were injected with Marcaine.  Sterile compressive dressing were applied.  Tourniquet was deflated and removed.  A long arm splint with the elbow at 90 degrees. Anesthesia was reversed.  Brought to the recovery room.  Tolerated the surgery well.  No complications.     Ninetta Lights, M.D.     DFM/MEDQ  D:  06/02/2016  T:  06/03/2016  Job:  [id number]

## 2016-07-21 ENCOUNTER — Telehealth (HOSPITAL_COMMUNITY): Payer: Self-pay | Admitting: Occupational Therapy

## 2016-07-21 ENCOUNTER — Ambulatory Visit (HOSPITAL_COMMUNITY): Payer: 59 | Attending: Orthopedic Surgery | Admitting: Occupational Therapy

## 2016-07-21 ENCOUNTER — Encounter (HOSPITAL_COMMUNITY): Payer: Self-pay | Admitting: Occupational Therapy

## 2016-07-21 DIAGNOSIS — M25521 Pain in right elbow: Secondary | ICD-10-CM

## 2016-07-21 DIAGNOSIS — M25621 Stiffness of right elbow, not elsewhere classified: Secondary | ICD-10-CM | POA: Insufficient documentation

## 2016-07-21 DIAGNOSIS — M6281 Muscle weakness (generalized): Secondary | ICD-10-CM | POA: Insufficient documentation

## 2016-07-21 NOTE — Telephone Encounter (Signed)
Judeen Hammans called back stating that Dr. Percell Miller will NOT change the order and that the PT order will stand as he requested. NF 07/21/16 Called Dr Debroah Loop office Judeen Hammans will put in request for OT order to be faxed today. NF 07/21/16

## 2016-07-21 NOTE — Therapy (Signed)
[facility] [address] [place], [place], [address] Phone: [phone]   Fax:  [phone]  Patient Details  Name: Marvin Munoz MRN: [id number] Date of Birth: 11-17-1960 Referring Provider:  Ninetta Lights, MD  Encounter Date: 07/21/2016           Occupational Therapy Evaluation  Pt presented for evaluation on 07/21/16, OT measurements listed below. Pt presented with PT order, fax sent to MD for correction to OT evaluation order. MD office reports PT order stands and pt only able to see PT. No charge for OT evaluation, pt will be rescheduled for PT services at this time.      Pt saw MD on 07/19/16, brace discontinued unless pt having increased pain. MD instructed pt to begin using RUE during daily tasks, no lifting at this time. No pain reported this session.    07/21/16 1122  Assessment  Diagnosis Right elbow tendon repair   Referring Provider Dr. Edmonia Lynch  Onset Date 06/02/16  Prior Therapy None  Precautions  Precautions Other (comment) (MD protocol)  Balance Screen  Has the patient fallen in the past 6 months No  Has the patient had a decrease in activity level because of a fear of falling?  No  Is the patient reluctant to leave their home because of a fear of falling?  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  ADL  ADL comments Pt is having difficulty with dressing, reaching, unable to lift items, grasping and holding items  Written Expression  Dominant Hand Right  Cognition  Overall Cognitive Status Within Functional Limits for tasks assessed  ROM / Strength  AROM / PROM / Strength AROM;PROM;Strength  Palpation  Palpation comment Min fascial restrictions palpated in right dorsal forearm  AROM  AROM Assessment Site Shoulder;Elbow;Forearm  Overall AROM  Comments Assessed seated  Right/Left Shoulder Right  Right/Left Elbow Right  Right/Left Forearm Right  Right Shoulder Flexion 166 Degrees  Right Shoulder ABduction 170 Degrees  Right Shoulder Internal Rotation 90 Degrees  Right Shoulder External Rotation 73 Degrees  Right Elbow Flexion 133  Right Elbow Extension 27  Right Forearm Pronation 90 Degrees  Right Forearm Supination 90 Degrees  PROM  Overall PROM Comments Assessed seated; shoulder ROM is [facility]  PROM Assessment Site Shoulder;Elbow;Forearm  Right/Left Shoulder Right  Right/Left Elbow Right  Right/Left Forearm Right  Right Elbow Flexion 137  Right Elbow Extension 20  Right Forearm Pronation 90 Degrees  Right Forearm Supination 90 Degrees  Strength  Overall Strength Comments Assessed seated, shoulder strength is [facility]  Strength Assessment Site Shoulder;Elbow;Forearm;Hand  Right/Left Shoulder Right  Right/Left Elbow Right  Right/Left Forearm Right  Right Elbow Flexion 3+/5  Right Elbow Extension 3+/5  Right/Left hand Right;Left  Right Hand Gross Grasp Functional  Right Hand Grip (lbs) 85  Left Hand Gross Grasp Functional  Left Hand Grip (lbs) [address], OTR/L  [phone] 07/21/2016, 4:13 PM  [facility] [place], [place], [address] Phone: [phone]   Fax:  [phone]

## 2016-07-21 NOTE — Patient Instructions (Signed)
*  Complete exercises ___10-15___ times each, __2-3_____ times per day*  AROM Exercises    1) Elbow flexion/extension Bend your elbow upwards as shown and then lower to a straighten position.    2) Wrist Flexion  Start with wrist at edge of table, palm facing up. With wrist hanging slightly off table, curl wrist upward, and back down.      3) Wrist Extension  Start with wrist at edge of table, palm facing down. With wrist slightly off the edge of the table, curl wrist up and back down.      4) Radial Deviations  Start with forearm flat against a table, wrist hanging slightly off the edge, and palm facing the wall. Bending at the wrist only, and keeping palm facing the wall, bend wrist so fist is pointing towards the floor, back up to start position, and up towards the ceiling. Return to start.        5) WRIST PRONATION  Turn your forearm towards palm face down.  Keep your elbow bent and by the side of your  Body.      6) WRIST SUPINATION  Turn your forearm towards palm face up.  Keep your elbow bent and by the side of your  Body.

## 2016-07-21 NOTE — Telephone Encounter (Signed)
Called Dr Debroah Loop office Marvin Munoz will put in request for OT order to be faxed today. NF 07/21/16

## 2016-07-27 ENCOUNTER — Encounter (HOSPITAL_COMMUNITY): Payer: 59

## 2016-07-27 ENCOUNTER — Ambulatory Visit (HOSPITAL_COMMUNITY): Payer: 59 | Admitting: Physical Therapy

## 2016-07-27 DIAGNOSIS — M6281 Muscle weakness (generalized): Secondary | ICD-10-CM | POA: Diagnosis not present

## 2016-07-27 DIAGNOSIS — M25621 Stiffness of right elbow, not elsewhere classified: Secondary | ICD-10-CM | POA: Diagnosis present

## 2016-07-27 DIAGNOSIS — M25521 Pain in right elbow: Secondary | ICD-10-CM | POA: Diagnosis not present

## 2016-07-27 NOTE — Therapy (Signed)
[facility] [place], [place], [address] Phone: [phone]   Fax:  [phone]  Physical Therapy Evaluation  Patient Details  Name: LANCER PERCLE MRN: [id number] Date of Birth: [birth year] Referring Provider: Kathryne Hitch  Encounter Date: 07/27/2016      PT End of Session - 07/27/16 0918    Visit Number 1   Number of Visits 12   Date for PT Re-Evaluation 08/26/16   Authorization Type [employer]   Authorization - Visit Number 1   Authorization - Number of Visits 12   PT Start Time 0815   PT Stop Time [id number]   PT Time Calculation (min) 40 min   Activity Tolerance Patient tolerated treatment well   Behavior During Therapy [facility] for tasks assessed/performed      Past Medical History:  Diagnosis Date  . Allergic rhinitis   . Aortic insufficiency due to bicuspid aortic valve   . BPH (benign prostatic hyperplasia)   . Bursitis of elbow    gouty olecranon, right elbow  . Eczema    forearm  . Fever blister   . Foot drop, right 09/24/2014  . Gastroesophageal reflux   . Gout   . Guillain Barr syndrome ([place]) 1970's  . Guillain Barr syndrome ([place])   . Guillain-Barre syndrome ([place])   . HTN (hypertension)   . Migraine headache   . 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 Nocturnal leg cramps 09/26/2014  . PVC's (premature ventricular contractions)    hx of benign  . Restless legs syndrome (RLS)     Past Surgical History:  Procedure Laterality Date  . BREAST LUMPECTOMY Right   . CARDIAC CATHETERIZATION    . COLONOSCOPY    . JOINT REPLACEMENT Left 09/10/2015   partial knee replacement  . TENDON RECONSTRUCTION Right 06/02/2016   Procedure: right elbow extensor tendon repair and debridement and repair lateral collateral ligament;  Surgeon: Ninetta Lights, MD;  Location: [facility];  Service: Orthopedics;  Laterality: Right;  . TONSILLECTOMY      There were no vitals filed for this visit.        Subjective Assessment - 07/27/16 0915    Subjective Pt states that he underwent a Rt elbow repain orn 06/02/2016.   He states that he really hasn't been using his hand except for light activities ie getting a cup out of the cupboard.     Pertinent History elbow extensor tendon repair on 12/14   Patient Stated Goals Full use of his right arm; to be able to work in the yard, golf, lift and carry objects.   Currently in Pain? No/denies            [facility] PT Assessment - 07/27/16 0001      Assessment   Medical Diagnosis Rt elbow extensor tendon repair   Referring Provider Kathryne Hitch   Onset Date/Surgical Date 06/02/16   Hand Dominance Right   Prior Therapy none     Precautions   Precautions Other (comment)  MD protocol     Balance Screen   Has the patient fallen in the past 6 months No   Has the patient had a decrease in activity level because of a fear of falling?  No   Is the patient reluctant to leave their home because of a fear of falling?  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     AROM   Overall AROM Comments Assessed seated   Right Shoulder Flexion 170 Degrees   Right Shoulder ABduction 170 Degrees   Right Shoulder Internal Rotation 90 Degrees   Right Shoulder External Rotation 73 Degrees   Right Elbow Flexion 135   Right Elbow Extension 18   Right Forearm Pronation 90 Degrees   Right Forearm Supination 90 Degrees     PROM   Overall PROM Comments Assessed seated; shoulder ROM is WFL   Right Elbow Flexion 137   Right Elbow Extension 20   Right Forearm Pronation 90 Degrees   Right Forearm Supination 90 Degrees     Strength   Overall Strength Comments Assessed seated, shoulder strength is [facility]   Right/Left Shoulder Right   Right Elbow Flexion 3+/5   Right Elbow Extension 3+/5   Right  Hand Gross Grasp Functional   Right Hand Grip (lbs) 85   Left Hand Gross Grasp Functional   Left Hand Grip (lbs) 98     Palpation   Palpation comment Min fascial restrictions palpated in right dorsal forearm                   [facility] Adult PT Treatment/Exercise - 07/27/16 0001      Exercises   Exercises Shoulder;Elbow;Wrist     Elbow Exercises   Elbow Flexion AROM;Right;10 reps   Elbow Extension AROM;10 reps   Forearm Supination Strengthening;Right;Supine;Standing   Forearm Pronation Strengthening;Right;Standing   Forearm Pronation Limitations 2#   Wrist Flexion Strengthening;Right;Other reps (comment)   Bar Weights/Barbell (Wrist Flexion) 1 lb  30 reps    Wrist Extension Strengthening;Right   Bar Weights/Barbell (Wrist Extension) 1 lb  30 reps      Shoulder Exercises: Pulleys   Flexion 2 minutes     Shoulder Exercises: ROM/Strengthening   UBE (Upper Arm Bike) --  forward and backward x 1' each level 1      Modalities   Modalities Moist Heat     Moist Heat Therapy   Number Minutes Moist Heat 5 Minutes   Moist Heat Location Elbow  with elbow on extension stretch.  Done at beginning      Manual Therapy   Manual Therapy Soft tissue mobilization;Myofascial release   Manual therapy comments done seperate from all other aspects of skilled therapy    Soft tissue mobilization anterior aspect of elbow   Myofascial Release anterior aspect of elbow                 PT Education - 07/27/16 0900    Education provided Yes   Education Details HEP to begin stretching  as well as hand grip; beginning resisted wrist motion    Person(s) Educated Patient   Methods Explanation   Comprehension Verbalized understanding;Returned demonstration          PT Short Term Goals - 07/27/16 0927      PT SHORT TERM GOAL #1   Title Pt to have full extension and flextion of right elbow to be able to complete normal daily activities.    Time 3   Period Weeks   Status New      PT SHORT TERM GOAL #2   Title Pt Rt elbow strength to be increased to where pt is able to make a bed with no difficulty    Time 3   Period Weeks   Status  New     PT SHORT TERM GOAL #3   Title PT to be able to  lift a sauce pain with his right arm with no difficulty.    Time 3   Period Weeks   Status New           PT Long Term Goals - 07/27/16 0932      PT LONG TERM GOAL #1   Title Pt to be able to complete gardening tasks with his right arm without difficulty    Time 6   Period Weeks   Status New     PT LONG TERM GOAL #2   Title Pt to have begun practice swings at the golf range without increased pain in his right elbow    Time 6   Period Weeks   Status New     PT LONG TERM GOAL #3   Title Pt to be able to carry shopping into his house using his right arm without increased pain    Time 6   Period Weeks   Status New               Plan - 07/27/16 0920    Clinical Impression Statement Mr. Hamel is a 56 yo male who had a Rt elbow extensor repair on 05/23/2016.  He is now 8 weeks post surgery and has been referred to skilled physical therapy.  Examination demonstrates decreased ROM in pt shoulder as well as his elbow, decreased strength and decreased activity tolerance.  Mr. Bigbie will benefit from skilled physical therapy to address these issues and progress his strength and endurance to allow him to return to his previous functional state.     Rehab Potential Good   PT Frequency 2x / week   PT Duration 6 weeks   PT Treatment/Interventions ADLs/Self Care Home Management;Moist Heat;Manual techniques;Therapeutic activities;Therapeutic exercise;Patient/family education   PT Next Visit Plan Progress weights as pt can tolerate, instruct and give isometric strengthening for elbow flexion and extension in multiple degrees ie 30, 60 , 90, 120; supine protraction/retraction with 2#;  progress to t-band, wall planks, then progress to table planks,    PT Home Exercise Plan  to begin 1# weight  30 reps for wrist flexion/extension; 10  reps for elbow flexion/ extension, self stretching for anterior capsule of elbow.    Consulted and Agree with Plan of Care Patient      Patient will benefit from skilled therapeutic intervention in order to improve the following deficits and impairments:  Decreased activity tolerance, Decreased strength, Decreased range of motion, Increased fascial restricitons  Visit Diagnosis: Stiffness of right elbow, not elsewhere classified - Plan: PT plan of care cert/re-cert  Muscle weakness (generalized) - Plan: PT plan of care cert/re-cert     Problem List Patient Active Problem List   Diagnosis Date Noted  . Nocturnal leg cramps 09/26/2014  . Foot drop, right 09/24/2014  . 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 Upper airway cough syndrome 09/10/2014  . Aortic valve disease 11/19/2013  . Essential hypertension 11/19/2013  . Esophageal reflux 11/19/2013  . Gout 11/19/2013  . Aortic root enlargement [facility]) 11/19/2013   Rayetta Humphrey, PT CLT [phone] 07/27/2016, 9:38 AM  [facility] [address] [place], [place], [address] Phone: [phone]   Fax:  [phone]  Name: HANIEL MYSLINSKI MRN: [id number] Date of Birth: 1961-04-07

## 2016-07-27 NOTE — Patient Instructions (Addendum)
Forearm Supination / Pronation: Resisted    With ____1 pound object in right hand, slowly turn palm up, then down. Repeat ___30_ times per set. Do __1__ sets per session. Do _2___ sessions per day.  Copyright  VHI. All rights reserved.  AROM: Elbow Flexion / Extension   Support upper arm with pillows  With right hand palm up, gently bend elbow as far as possible. Then straighten arm as far as possible. Repeat _10__ times per set. Do 1____ sets per session. Do _1___ sessions per day. Repeat with 1 # weight.  Copyright  VHI. All rights reserved.

## 2016-07-29 ENCOUNTER — Telehealth (HOSPITAL_COMMUNITY): Payer: Self-pay | Admitting: Physical Therapy

## 2016-07-29 ENCOUNTER — Ambulatory Visit (HOSPITAL_COMMUNITY): Payer: 59 | Admitting: Physical Therapy

## 2016-07-29 DIAGNOSIS — M6281 Muscle weakness (generalized): Secondary | ICD-10-CM

## 2016-07-29 DIAGNOSIS — M25521 Pain in right elbow: Secondary | ICD-10-CM | POA: Diagnosis not present

## 2016-07-29 DIAGNOSIS — M25621 Stiffness of right elbow, not elsewhere classified: Secondary | ICD-10-CM

## 2016-07-29 NOTE — Telephone Encounter (Signed)
Pt called to confirm that all future apptments are at 8:15, that is correct. NF 07/29/16

## 2016-07-29 NOTE — Therapy (Signed)
[facility] [address] [place], [place], [address] Phone: [phone]   Fax:  [phone]  Physical Therapy Treatment  Patient Details  Name: Marvin Munoz MRN: [id number] Date of Birth: 1961/04/15 Referring Provider: Kathryne Hitch  Encounter Date: 07/29/2016      PT End of Session - 07/29/16 0844    Visit Number 2   Number of Visits 12   Date for PT Re-Evaluation 08/26/16   Authorization Type UHC   Authorization - Visit Number 2   Authorization - Number of Visits 12   PT Start Time [id number]   PT Stop Time 0858   PT Time Calculation (min) 41 min   Activity Tolerance Patient tolerated treatment well   Behavior During Therapy [facility] for tasks assessed/performed      Past Medical History:  Diagnosis Date  . Allergic rhinitis   . Aortic insufficiency due to bicuspid aortic valve   . BPH (benign prostatic hyperplasia)   . Bursitis of elbow    gouty olecranon, right elbow  . Eczema    forearm  . Fever blister   . Foot drop, right 09/24/2014  . Gastroesophageal reflux   . Gout   . Guillain Barr syndrome ([place]) 1970's  . Guillain Barr syndrome ([place])   . Guillain-Barre syndrome ([place])   . HTN (hypertension)   . Migraine headache   . 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 Nocturnal leg cramps 09/26/2014  . PVC's (premature ventricular contractions)    hx of benign  . Restless legs syndrome (RLS)     Past Surgical History:  Procedure Laterality Date  . BREAST LUMPECTOMY Right   . CARDIAC CATHETERIZATION    . COLONOSCOPY    . JOINT REPLACEMENT Left 09/10/2015   partial knee replacement  . TENDON RECONSTRUCTION Right 06/02/2016   Procedure: right elbow extensor tendon repair and debridement and repair lateral collateral ligament;  Surgeon: Ninetta Lights, MD;  Location: [facility];  Service: Orthopedics;  Laterality: Right;  . TONSILLECTOMY      There were no vitals filed for this visit.       Subjective Assessment - 07/29/16 0836    Subjective Pt states he is just a little sore today, 6/10 muscle soreness but hasn't had any pain specifically. He continues to perform his HEP.   Pertinent History elbow extensor tendon repair on 12/14   Patient Stated Goals Full use of his right arm; to be able to work in the yard, golf, lift and carry objects.   Currently in Pain? No/denies (currently)            [facility] PT Assessment - 07/29/16 0001      AROM   Right Elbow Flexion 140                     [facility] Adult PT Treatment/Exercise - 07/29/16 0001      Elbow Exercises   Elbow Flexion Right;15 reps   Theraband Level (Elbow Flexion) Level 2 (Red)   Elbow Flexion Limitations x2 sets    Elbow Extension 10 reps   Theraband Level (Elbow Extension) Level 2 (Red)   Elbow Extension Limitations x2 sets    Forearm Supination Right;10 reps;Other reps (comment)  x2 sets    Theraband Level (Supination) Level 2 (Red)   Theraband Level (Pronation) Level 2 (Red)   Forearm Pronation Limitations x2 sets with red TB     Wrist Exercises  Wrist Radial Deviation Right;20 reps;Seated   Bar Weights/Barbell (Radial Deviation) 2 lbs   Other wrist exercises wrist extension, seated with elbow bent 2x20 reps with 2#     Manual Therapy   Manual Therapy Joint mobilization   Manual therapy comments separate rest of session   Joint Mobilization Rt posterior distal radioulnar mobs, grade 3 and 4   Soft tissue mobilization Rt Distal tricep STM                PT Education - 07/29/16 0841    Education provided Yes   Education Details implications for manual techniques; encouraged pt to apply ice following exercises performed during his session.    Person(s) Educated Patient   Methods Explanation;Verbal cues   Comprehension Verbalized understanding;Returned demonstration          PT Short Term Goals - 07/27/16 0927      PT SHORT TERM GOAL #1   Title Pt to have full extension  and flextion of right elbow to be able to complete normal daily activities.    Time 3   Period Weeks   Status New     PT SHORT TERM GOAL #2   Title Pt Rt elbow strength to be increased to where pt is able to make a bed with no difficulty    Time 3   Period Weeks   Status New     PT SHORT TERM GOAL #3   Title PT to be able to  lift a sauce pain with his right arm with no difficulty.    Time 3   Period Weeks   Status New           PT Long Term Goals - 07/27/16 0932      PT LONG TERM GOAL #1   Title Pt to be able to complete gardening tasks with his right arm without difficulty    Time 6   Period Weeks   Status New     PT LONG TERM GOAL #2   Title Pt to have begun practice swings at the golf range without increased pain in his right elbow    Time 6   Period Weeks   Status New     PT LONG TERM GOAL #3   Title Pt to be able to carry shopping into his house using his right arm without increased pain    Time 6   Period Weeks   Status New               Plan - 07/29/16 0957    Clinical Impression Statement Pt arrived today reporting minimal soreness from his exercises. He continues to work on his HEP as instructed. He presents today with continued limitations in elbow extension/flexion as well as wrist supination. Performed manual techniques to address soft tissue and joint restrictions, noting an almost 10 deg improvement in elbow flexion by the end of the session. Introduced light resistance to gradually improve elbow strength, with pt reporting no increase in pain at this time. Will continue with current POC.   Rehab Potential Good   PT Frequency 2x / week   PT Duration 6 weeks   PT Treatment/Interventions ADLs/Self Care Home Management;Moist Heat;Manual techniques;Therapeutic activities;Therapeutic exercise;Patient/family education   PT Next Visit Plan elbow extension/flexion ROM, gradually increasing resistance with wrist extension/supination and elbow strength     PT Home Exercise Plan to begin 1# weight  30 reps for wrist flexion/extension; 10  reps for elbow flexion/ extension, self  stretching for anterior capsule of elbow.    Consulted and Agree with Plan of Care Patient      Patient will benefit from skilled therapeutic intervention in order to improve the following deficits and impairments:  Decreased activity tolerance, Decreased strength, Decreased range of motion, Increased fascial restricitons  Visit Diagnosis: Stiffness of right elbow, not elsewhere classified  Muscle weakness (generalized)  Pain in right elbow     Problem List Patient Active Problem List   Diagnosis Date Noted  . Nocturnal leg cramps 09/26/2014  . Foot drop, right 09/24/2014  . 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 Upper airway cough syndrome 09/10/2014  . Aortic valve disease 11/19/2013  . Essential hypertension 11/19/2013  . Esophageal reflux 11/19/2013  . Gout 11/19/2013  . Aortic root enlargement ([place]) 11/19/2013    10:27 AM,07/29/16 Elly Modena PT, DPT [facility] [address] [place], [place], [address] Phone: [phone]   Fax:  [phone]  Name: ELIZAH COURY MRN: [id number] Date of Birth: 03/13/1961

## 2016-08-02 DIAGNOSIS — M7711 Lateral epicondylitis, right elbow: Secondary | ICD-10-CM | POA: Diagnosis not present

## 2016-08-03 ENCOUNTER — Ambulatory Visit (HOSPITAL_COMMUNITY): Payer: 59 | Admitting: Physical Therapy

## 2016-08-03 DIAGNOSIS — M25521 Pain in right elbow: Secondary | ICD-10-CM

## 2016-08-03 DIAGNOSIS — M6281 Muscle weakness (generalized): Secondary | ICD-10-CM

## 2016-08-03 DIAGNOSIS — M25621 Stiffness of right elbow, not elsewhere classified: Secondary | ICD-10-CM

## 2016-08-03 NOTE — Therapy (Signed)
[facility] [address] [place], [place], [address] Phone: [phone]   Fax:  [phone]  Physical Therapy Treatment  Patient Details  Name: Marvin Munoz MRN: [id number] Date of Birth: [birth year] Referring Provider: Kathryne Hitch  Encounter Date: 08/03/2016      PT End of Session - 08/03/16 1224    Visit Number 3   Number of Visits 12   Date for PT Re-Evaluation 08/26/16   Authorization Type UHC   Authorization - Visit Number 3   Authorization - Number of Visits 12   PT Start Time 0815   PT Stop Time [id number]   PT Time Calculation (min) 40 min   Activity Tolerance Patient tolerated treatment well   Behavior During Therapy [facility] for tasks assessed/performed      Past Medical History:  Diagnosis Date  . Allergic rhinitis   . Aortic insufficiency due to bicuspid aortic valve   . BPH (benign prostatic hyperplasia)   . Bursitis of elbow    gouty olecranon, right elbow  . Eczema    forearm  . Fever blister   . Foot drop, right 09/24/2014  . Gastroesophageal reflux   . Gout   . Guillain Barr syndrome ([place]) 1970's  . Guillain Barr syndrome ([place])   . Guillain-Barre syndrome ([place])   . HTN (hypertension)   . Migraine headache   . 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 Nocturnal leg cramps 09/26/2014  . PVC's (premature ventricular contractions)    hx of benign  . Restless legs syndrome (RLS)     Past Surgical History:  Procedure Laterality Date  . BREAST LUMPECTOMY Right   . CARDIAC CATHETERIZATION    . COLONOSCOPY    . JOINT REPLACEMENT Left 09/10/2015   partial knee replacement  . TENDON RECONSTRUCTION Right 06/02/2016   Procedure: right elbow extensor tendon repair and debridement and repair lateral collateral ligament;  Surgeon: Ninetta Lights, MD;  Location: [facility];  Service: Orthopedics;  Laterality: Right;  . TONSILLECTOMY      There were no vitals filed for this visit.       Subjective Assessment - 08/03/16 1127    Subjective Pt states he fell coming down the steps saturday and hit his elbow pretty hard.  States he went back to the MD and he cleared everything reporting no injuries.  Pt states he feels he is getting better but is a little sore from his fall.   Currently in Pain? No/denies            [facility] PT Assessment - 08/03/16 0001      AROM   Right Elbow Flexion 140  was 135 on 2/7   Right Elbow Extension 13  was 18 on 2/7 and 27 on 2/1                     [facility] Adult PT Treatment/Exercise - 08/03/16 0001      Elbow Exercises   Elbow Flexion Right;15 reps   Bar Weights/Barbell (Elbow Flexion) 3 lbs   Wrist Flexion Strengthening;Right;Other reps (comment);15 reps   Bar Weights/Barbell (Wrist Flexion) 2 lbs   Wrist Extension Strengthening;Right;15 reps   Bar Weights/Barbell (Wrist Extension) 2 lbs     Shoulder Exercises: ROM/Strengthening   UBE (Upper Arm Bike) 2'forward, 2'backward     Manual Therapy   Manual Therapy Myofascial release;Soft tissue mobilization;Joint mobilization;Passive ROM   Manual therapy comments separate rest of session  Soft tissue mobilization Rt Distal tricep STM   Myofascial Release anterior aspect of elbow    Passive ROM extension and flexion of elbow                  PT Short Term Goals - 07/27/16 [id number]      PT SHORT TERM GOAL #1   Title Pt to have full extension and flextion of right elbow to be able to complete normal daily activities.    Time 3   Period Weeks   Status New     PT SHORT TERM GOAL #2   Title Pt Rt elbow strength to be increased to where pt is able to make a bed with no difficulty    Time 3   Period Weeks   Status New     PT SHORT TERM GOAL #3   Title PT to be able to  lift a sauce pain with his right arm with no difficulty.    Time 3   Period Weeks   Status New           PT Long Term Goals - 07/27/16 0932      PT LONG TERM GOAL #1   Title Pt to be  able to complete gardening tasks with his right arm without difficulty    Time 6   Period Weeks   Status New     PT LONG TERM GOAL #2   Title Pt to have begun practice swings at the golf range without increased pain in his right elbow    Time 6   Period Weeks   Status New     PT LONG TERM GOAL #3   Title Pt to be able to carry shopping into his house using his right arm without increased pain    Time 6   Period Weeks   Status New               Plan - 08/03/16 1224    Clinical Impression Statement Noted bruising posterior to scar that is now in yellowing phase with abraison in center.  Completed manual to Rt forearm prior to PROM with resulting AROM improvement of 5 degrees in each direction as tested 7 days prior.  Able to increase weights this session with wrist and elbow flexion exercises.  Pt without c/o pain during or after plan of care.    Rehab Potential Good   PT Frequency 2x / week   PT Duration 6 weeks   PT Treatment/Interventions ADLs/Self Care Home Management;Moist Heat;Manual techniques;Therapeutic activities;Therapeutic exercise;Patient/family education   PT Next Visit Plan elbow extension/flexion ROM, gradually increasing resistance with wrist extension/supination and elbow strength    PT Home Exercise Plan to begin 1# weight  30 reps for wrist flexion/extension; 10  reps for elbow flexion/ extension, self stretching for anterior capsule of elbow.    Consulted and Agree with Plan of Care Patient      Patient will benefit from skilled therapeutic intervention in order to improve the following deficits and impairments:  Decreased activity tolerance, Decreased strength, Decreased range of motion, Increased fascial restricitons  Visit Diagnosis: Stiffness of right elbow, not elsewhere classified  Muscle weakness (generalized)  Pain in right elbow     Problem List Patient Active Problem List   Diagnosis Date Noted  . Nocturnal leg cramps 09/26/2014  .  Foot drop, right 09/24/2014  . 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 Upper airway cough syndrome 09/10/2014  .  Aortic valve disease 11/19/2013  . Essential hypertension 11/19/2013  . Esophageal reflux 11/19/2013  . Gout 11/19/2013  . Aortic root enlargement ([place]) 11/19/2013    Teena Irani, PTA/CLT [phone]  08/03/2016, 12:28 PM  [facility] [address] [place], [place], [address] Phone: [phone]   Fax:  [phone]  Name: Marvin Munoz MRN: [id number] Date of Birth: Oct 14, 1960

## 2016-08-05 ENCOUNTER — Ambulatory Visit (HOSPITAL_COMMUNITY): Payer: 59 | Admitting: Physical Therapy

## 2016-08-05 DIAGNOSIS — M6281 Muscle weakness (generalized): Secondary | ICD-10-CM

## 2016-08-05 DIAGNOSIS — M25521 Pain in right elbow: Secondary | ICD-10-CM

## 2016-08-05 DIAGNOSIS — M25621 Stiffness of right elbow, not elsewhere classified: Secondary | ICD-10-CM

## 2016-08-05 NOTE — Therapy (Signed)
[facility] [address] [place], [place], [address] Phone: [phone]   Fax:  [phone]  Physical Therapy Treatment  Patient Details  Name: Marvin Munoz MRN: [id number] Date of Birth: Jul 27, 1960 Referring Provider: Kathryne Hitch  Encounter Date: 08/05/2016      PT End of Session - 08/05/16 0855    Visit Number 4   Number of Visits 12   Date for PT Re-Evaluation 08/26/16   Authorization Type UHC   Authorization - Visit Number 4   Authorization - Number of Visits 12   PT Start Time 0810   PT Stop Time 0900   PT Time Calculation (min) 50 min   Activity Tolerance Patient tolerated treatment well   Behavior During Therapy [facility] for tasks assessed/performed      Past Medical History:  Diagnosis Date  . Allergic rhinitis   . Aortic insufficiency due to bicuspid aortic valve   . BPH (benign prostatic hyperplasia)   . Bursitis of elbow    gouty olecranon, right elbow  . Eczema    forearm  . Fever blister   . Foot drop, right 09/24/2014  . Gastroesophageal reflux   . Gout   . Guillain Barr syndrome ([place]) 1970's  . Guillain Barr syndrome ([place])   . Guillain-Barre syndrome ([place])   . HTN (hypertension)   . Migraine headache   . 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 Nocturnal leg cramps 09/26/2014  . PVC's (premature ventricular contractions)    hx of benign  . Restless legs syndrome (RLS)     Past Surgical History:  Procedure Laterality Date  . BREAST LUMPECTOMY Right   . CARDIAC CATHETERIZATION    . COLONOSCOPY    . JOINT REPLACEMENT Left 09/10/2015   partial knee replacement  . TENDON RECONSTRUCTION Right 06/02/2016   Procedure: right elbow extensor tendon repair and debridement and repair lateral collateral ligament;  Surgeon: Ninetta Lights, MD;  Location: [facility];  Service: Orthopedics;  Laterality: Right;  . TONSILLECTOMY      There were no vitals filed for this visit.       Subjective Assessment - 08/05/16 0816    Subjective Pt states that he can tell that he has increased motion.  He is gradually trying to increase his activtity throughout the day.   Pertinent History elbow extensor tendon repair on 12/14   Patient Stated Goals Full use of his right arm; to be able to work in the yard, golf, lift and carry objects.   Currently in Pain? No/denies                         [facility] Adult PT Treatment/Exercise - 08/05/16 0001      Elbow Exercises   Elbow Flexion Right;15 reps   Theraband Level (Elbow Flexion) Level 2 (Red)  review only gave pt t-band    Bar Weights/Barbell (Elbow Flexion) 2 lbs;3 lbs   Elbow Extension 15 reps   Theraband Level (Elbow Extension) Level 2 (Red)  review only    Elbow Extension Limitations 2, 3 LB    Forearm Supination Right;15 reps;Other reps (comment)  x2 sets    Theraband Level (Supination) Level 2 (Red)  review only    Forearm Pronation 15 reps   Theraband Level (Pronation) Level 2 (Red)   Wrist Flexion Strengthening;Right;20 reps   Bar Weights/Barbell (Wrist Flexion) 1 lb;2 lbs   Wrist Extension Strengthening;Right;15 reps  Bar Weights/Barbell (Wrist Extension) 1 lb;2 lbs     Shoulder Exercises: ROM/Strengthening   UBE (Upper Arm Bike) --     Wrist Exercises   Wrist Radial Deviation --   Bar Weights/Barbell (Radial Deviation) --   Other wrist exercises --     Manual Therapy   Manual Therapy Soft tissue mobilization;Myofascial release   Manual therapy comments separate rest of session   Joint Mobilization --   Soft tissue mobilization Rt Distal tricep STM   Myofascial Release anterior aspect of elbow    Passive ROM --                  PT Short Term Goals - 07/27/16 [id number]      PT SHORT TERM GOAL #1   Title Pt to have full extension and flextion of right elbow to be able to complete normal daily activities.    Time 3   Period Weeks   Status New     PT SHORT TERM GOAL #2   Title  Pt Rt elbow strength to be increased to where pt is able to make a bed with no difficulty    Time 3   Period Weeks   Status New     PT SHORT TERM GOAL #3   Title PT to be able to  lift a sauce pain with his right arm with no difficulty.    Time 3   Period Weeks   Status New           PT Long Term Goals - 07/27/16 0932      PT LONG TERM GOAL #1   Title Pt to be able to complete gardening tasks with his right arm without difficulty    Time 6   Period Weeks   Status New     PT LONG TERM GOAL #2   Title Pt to have begun practice swings at the golf range without increased pain in his right elbow    Time 6   Period Weeks   Status New     PT LONG TERM GOAL #3   Title Pt to be able to carry shopping into his house using his right arm without increased pain    Time 6   Period Weeks   Status New               Plan - 08/05/16 [id number]    Clinical Impression Statement Pt given red t-band to complete exercises at home.  Pt continues to improve in ROM.  Will continue to focus on gaining full elbow extension prior to pushing strengthening to hard.    Rehab Potential Good   PT Frequency 2x / week   PT Duration 6 weeks   PT Treatment/Interventions ADLs/Self Care Home Management;Moist Heat;Manual techniques;Therapeutic activities;Therapeutic exercise;Patient/family education   PT Next Visit Plan begin wall plank next treatment.    PT Home Exercise Plan to begin 1# weight  30 reps for wrist flexion/extension; 10  reps for elbow flexion/ extension, self stretching for anterior capsule of elbow.    Consulted and Agree with Plan of Care Patient      Patient will benefit from skilled therapeutic intervention in order to improve the following deficits and impairments:  Decreased activity tolerance, Decreased strength, Decreased range of motion, Increased fascial restricitons  Visit Diagnosis: Stiffness of right elbow, not elsewhere classified  Muscle weakness (generalized)  Pain  in right elbow     Problem List Patient Active Problem List  Diagnosis Date Noted  . Nocturnal leg cramps 09/26/2014  . Foot drop, right 09/24/2014  . 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 Upper airway cough syndrome 09/10/2014  . Aortic valve disease 11/19/2013  . Essential hypertension 11/19/2013  . Esophageal reflux 11/19/2013  . Gout 11/19/2013  . Aortic root enlargement [facility]) 11/19/2013    Rayetta Humphrey, PT CLT [phone] 08/05/2016, 9:03 AM  [facility] [address] [place], [place], [address] Phone: [phone]   Fax:  [phone]  Name: RODD POLAN MRN: [id number] Date of Birth: 07/08/1960

## 2016-08-10 ENCOUNTER — Ambulatory Visit (HOSPITAL_COMMUNITY): Payer: 59 | Admitting: Physical Therapy

## 2016-08-10 DIAGNOSIS — M6281 Muscle weakness (generalized): Secondary | ICD-10-CM

## 2016-08-10 DIAGNOSIS — M25521 Pain in right elbow: Secondary | ICD-10-CM

## 2016-08-10 DIAGNOSIS — M25621 Stiffness of right elbow, not elsewhere classified: Secondary | ICD-10-CM

## 2016-08-10 NOTE — Therapy (Signed)
[place], [place], [address] Phone: [phone]   Fax:  [phone]  Physical Therapy Treatment  Patient Details  Name: Marvin Munoz MRN: [id number] Date of Birth: [birth year] Referring Provider: Kathryne Hitch  Encounter Date: 08/10/2016      PT End of Session - 08/10/16 1239    Visit Number 5   Number of Visits 12   Date for PT Re-Evaluation 08/26/16   Authorization Type UHC   Authorization - Visit Number 5   Authorization - Number of Visits 12   PT Start Time 0815   PT Stop Time [id number]   PT Time Calculation (min) 40 min   Activity Tolerance Patient tolerated treatment well   Behavior During Therapy [facility] for tasks assessed/performed      Past Medical History:  Diagnosis Date  . Allergic rhinitis   . Aortic insufficiency due to bicuspid aortic valve   . BPH (benign prostatic hyperplasia)   . Bursitis of elbow    gouty olecranon, right elbow  . Eczema    forearm  . Fever blister   . Foot drop, right 09/24/2014  . Gastroesophageal reflux   . Gout   . Guillain Barr syndrome ([place]) 1970's  . Guillain Barr syndrome ([place])   . Guillain-Barre syndrome ([place])   . HTN (hypertension)   . Migraine headache   . 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 Nocturnal leg cramps 09/26/2014  . PVC's (premature ventricular contractions)    hx of benign  . Restless legs syndrome (RLS)     Past Surgical History:  Procedure Laterality Date  . BREAST LUMPECTOMY Right   . CARDIAC CATHETERIZATION    . COLONOSCOPY    . JOINT REPLACEMENT Left 09/10/2015   partial knee replacement  . TENDON RECONSTRUCTION Right 06/02/2016   Procedure: right elbow extensor tendon repair and debridement and repair lateral collateral ligament;  Surgeon: Ninetta Lights, MD;  Location: [facility];  Service: Orthopedics;  Laterality: Right;  . TONSILLECTOMY      There were no vitals filed for this visit.       Subjective Assessment - 08/10/16 1238    Subjective Pt states he can tell an improvement in his ROM.  Currently without pain, however does have some discomfort as the day goes on.   Currently in Pain? No/denies                         [facility] Adult PT Treatment/Exercise - 08/10/16 0001      Elbow Exercises   Elbow Flexion Right;15 reps  3 sets, 2 with band and one with 3#   Theraband Level (Elbow Flexion) Level 2 (Red)   Bar Weights/Barbell (Elbow Flexion) 3 lbs   Elbow Extension 15 reps   Theraband Level (Elbow Extension) Level 2 (Red)  2 sets with red theraband one set with 3#   Elbow Extension Limitations 3 lbs   Forearm Supination Right;15 reps;Other reps (comment)  one set 2#, one set 3#   Forearm Pronation 15 reps  one set 2#, one set 3#   Wrist Flexion 15 reps  one set each   Bar Weights/Barbell (Wrist Flexion) 2 lbs;3 lbs   Wrist Extension 15 reps  one set each   Bar Weights/Barbell (Wrist Extension) 2 lbs;3 lbs     Shoulder Exercises: Standing   Other Standing Exercises against wall UE flexion 15 reps  Shoulder Exercises: ROM/Strengthening   UBE (Upper Arm Bike) 2'forward, 2'backward     Manual Therapy   Manual Therapy Soft tissue mobilization;Myofascial release   Manual therapy comments separate rest of session   Soft tissue mobilization Rt Distal tricep STM   Myofascial Release anterior aspect of elbow                   PT Short Term Goals - 07/27/16 [id number]      PT SHORT TERM GOAL #1   Title Pt to have full extension and flextion of right elbow to be able to complete normal daily activities.    Time 3   Period Weeks   Status New     PT SHORT TERM GOAL #2   Title Pt Rt elbow strength to be increased to where pt is able to make a bed with no difficulty    Time 3   Period Weeks   Status New     PT SHORT TERM GOAL #3   Title PT to be able to  lift a sauce pain with his right arm with no difficulty.    Time 3   Period Weeks    Status New           PT Long Term Goals - 07/27/16 0932      PT LONG TERM GOAL #1   Title Pt to be able to complete gardening tasks with his right arm without difficulty    Time 6   Period Weeks   Status New     PT LONG TERM GOAL #2   Title Pt to have begun practice swings at the golf range without increased pain in his right elbow    Time 6   Period Weeks   Status New     PT LONG TERM GOAL #3   Title Pt to be able to carry shopping into his house using his right arm without increased pain    Time 6   Period Weeks   Status New               Plan - 08/10/16 1242    Clinical Impression Statement Continued focus on getting full extension while strengthening surrounding musculature.  Able to increase PRE weight and sets this session.  Pt without c/o pain or issues throughout session or at conclusion.  Soft tissue and joint mobiltiy completed to Rt elbow prior to passive stretching.     Rehab Potential Good   PT Frequency 2x / week   PT Duration 6 weeks   PT Treatment/Interventions ADLs/Self Care Home Management;Moist Heat;Manual techniques;Therapeutic activities;Therapeutic exercise;Patient/family education   PT Next Visit Plan begin wall plank next treatment.    PT Home Exercise Plan to begin 1# weight  30 reps for wrist flexion/extension; 10  reps for elbow flexion/ extension, self stretching for anterior capsule of elbow.    Consulted and Agree with Plan of Care Patient      Patient will benefit from skilled therapeutic intervention in order to improve the following deficits and impairments:  Decreased activity tolerance, Decreased strength, Decreased range of motion, Increased fascial restricitons  Visit Diagnosis: Stiffness of right elbow, not elsewhere classified  Muscle weakness (generalized)  Pain in right elbow     Problem List Patient Active Problem List   Diagnosis Date Noted  . Nocturnal leg cramps 09/26/2014  . Foot drop, right 09/24/2014   . 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 Upper airway cough  syndrome 09/10/2014  . Aortic valve disease 11/19/2013  . Essential hypertension 11/19/2013  . Esophageal reflux 11/19/2013  . Gout 11/19/2013  . Aortic root enlargement ([place]) 11/19/2013    Teena Irani, PTA/CLT [phone]  08/10/2016, 12:45 PM  [facility] [address] [place], [place], [address] Phone: [phone]   Fax:  [phone]  Name: Marvin Munoz MRN: [id number] Date of Birth: 05-06-1961

## 2016-08-12 ENCOUNTER — Ambulatory Visit (HOSPITAL_COMMUNITY): Payer: 59 | Admitting: Physical Therapy

## 2016-08-12 DIAGNOSIS — M25521 Pain in right elbow: Secondary | ICD-10-CM | POA: Diagnosis not present

## 2016-08-12 DIAGNOSIS — M25621 Stiffness of right elbow, not elsewhere classified: Secondary | ICD-10-CM

## 2016-08-12 DIAGNOSIS — M6281 Muscle weakness (generalized): Secondary | ICD-10-CM

## 2016-08-12 NOTE — Therapy (Signed)
[place], [place], [address] Phone: [phone]   Fax:  [phone]  Physical Therapy Treatment/Reassessment  Patient Details  Name: Marvin Munoz MRN: [id number] Date of Birth: 06/11/1961 Referring Provider: Kathryne Hitch, MD  Encounter Date: 08/12/2016      PT End of Session - 08/12/16 1252    Visit Number 6   Number of Visits 12   Date for PT Re-Evaluation 08/26/16   Authorization Type UHC   Authorization - Visit Number 6   Authorization - Number of Visits 12   PT Start Time 3716   PT Stop Time 0859   PT Time Calculation (min) 42 min   Activity Tolerance Patient tolerated treatment well;No increased pain   Behavior During Therapy WFL for tasks assessed/performed      Past Medical History:  Diagnosis Date  . Allergic rhinitis   . Aortic insufficiency due to bicuspid aortic valve   . BPH (benign prostatic hyperplasia)   . Bursitis of elbow    gouty olecranon, right elbow  . Eczema    forearm  . Fever blister   . Foot drop, right 09/24/2014  . Gastroesophageal reflux   . Gout   . Guillain Barr syndrome ([place]) 1970's  . Guillain Barr syndrome ([place])   . Guillain-Barre syndrome ([place])   . HTN (hypertension)   . Migraine headache   . 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 Nocturnal leg cramps 09/26/2014  . PVC's (premature ventricular contractions)    hx of benign  . Restless legs syndrome (RLS)     Past Surgical History:  Procedure Laterality Date  . BREAST LUMPECTOMY Right   . CARDIAC CATHETERIZATION    . COLONOSCOPY    . JOINT REPLACEMENT Left 09/10/2015   partial knee replacement  . TENDON RECONSTRUCTION Right 06/02/2016   Procedure: right elbow extensor tendon repair and debridement and repair lateral collateral ligament;  Surgeon: Ninetta Lights, MD;  Location: [facility];  Service: Orthopedics;  Laterality: Right;  . TONSILLECTOMY      There were no  vitals filed for this visit.      Subjective Assessment - 08/12/16 0819    Subjective Pt states that things are going well. He feels that his ROM is much improved as well as his strength. He does feel that his strength and elbow extension is no where he would like for it to be. He continues to perform his HEP.    Pertinent History elbow extensor tendon repair on 12/14   Patient Stated Goals Full use of his right arm; to be able to work in the yard, golf, lift and carry objects.   Currently in Pain? Yes  Just some soreness in the elbow not worth rating            [facility] PT Assessment - 08/12/16 0001      Assessment   Medical Diagnosis Rt elbow extensor tendon repair   Referring Provider Kathryne Hitch, MD   Onset Date/Surgical Date 06/02/16   Hand Dominance Right   Next MD Visit 08/30/16   Prior Therapy none     Precautions   Precautions Other (comment)  MD protocol     Prior Function   Level of [facility]   Vocation Full time employment   Vocation Requirements supervising, computer work, Chemical engineer on work   Leisure golf, Haematologist, occasional home repairs     AROM   Overall AROM Comments  Assessed seated   Right Elbow Flexion 140   Right Elbow Extension 11   Right/Left Forearm Left   Right Forearm Pronation 90 Degrees   Right Forearm Supination 110 Degrees   Left Forearm Pronation 90 Degrees   Left Forearm Supination 115 Degrees     PROM   Overall PROM Comments Assessed seated; shoulder ROM is WFL   Right Elbow Flexion 137   Right Elbow Extension 20     Strength   Overall Strength Comments Assessed seated, shoulder strength is WFL   Right Elbow Flexion 4+/5   Right Elbow Extension 4+/5   Right Hand Gross Grasp Functional   Left Hand Gross Grasp Functional     Palpation   Palpation comment --                     [facility] Adult PT Treatment/Exercise - 08/12/16 0001      Elbow Exercises   Elbow Flexion Right;10 reps   Bar  Weights/Barbell (Elbow Flexion) 5 lbs   Elbow Flexion Limitations HEP demo   Elbow Extension Both;5 reps   Theraband Level (Elbow Extension) Level 3 (Green)   Elbow Extension Limitations HEP demo      Shoulder Exercises: Standing   Other Standing Exercises straight arm wall plank x15 sec, progressed to straight arm plank on countertop 2x15 sec   HEP demo     Self elbow extension mobilization stretch in sitting, x10 sec for HEP demo           PT Education - 08/12/16 1251    Education provided Yes   Education Details discussed progress and goals met; updated HEP    Person(s) Educated Patient   Methods Explanation;Demonstration;Handout   Comprehension Returned demonstration;Verbalized understanding          PT Short Term Goals - 08/12/16 0834      PT SHORT TERM GOAL #1   Title Pt to have full extension and flextion of right elbow to be able to complete normal daily activities.    Baseline ~10 deg lacking full extension   Time 3   Period Weeks   Status Partially Met     PT SHORT TERM GOAL #2   Title Pt Rt elbow strength to be increased to where pt is able to make a bed with no difficulty    Baseline Pt reports he is not avoiding using his RUE   Time 3   Period Weeks   Status Achieved     PT SHORT TERM GOAL #3   Title PT to be able to  lift a sauce pain with his right arm with no difficulty.    Time 3   Period Weeks   Status Achieved           PT Long Term Goals - 08/12/16 0835      PT LONG TERM GOAL #1   Title Pt to be able to complete gardening tasks with his right arm without difficulty    Baseline Pt has slowly started doing more of this but has not been using heavy equipment   Time 6   Period Weeks   Status Partially Met     PT LONG TERM GOAL #2   Title Pt to have begun practice swings at the golf range without increased pain in his right elbow    Time 6   Period Weeks   Status Unable to assess     PT LONG TERM GOAL #3   Title  Pt to be able to  carry shopping into his house using his right arm without increased pain    Baseline Pt is able to lift light grocery bags and bring them into the house   Time 6   Period Weeks   Status Partially Met     PT LONG TERM GOAL #4   Title Pt will demo increased Rt grip strength evident by average grip strength improvement to no greater than 5 lb difference between Rt and Lt   Time 6   Period Weeks               Plan - 08/12/16 1252    Clinical Impression Statement Pt continues to perform his HEP without difficulty, he was reassessed this session having made progress in elbow ROM, strength and activity tolerance overall. He does continue to lack ~10 deg of elbow extension as well as Rt grip weakness and decreased RUE endurance. Reviewed pt's HEP and made updates to this which reflected his progress so far. He was able to demonstrate proper technique with minimal cues necessary and reported no increase in pain following the session. At this time, he was encouraged to schedule 1-2 more visits leading up to his re-evaluation in March. He verbalized understanding.     Rehab Potential Good   PT Frequency 2x / week   PT Duration 6 weeks   PT Treatment/Interventions ADLs/Self Care Home Management;Moist Heat;Manual techniques;Therapeutic activities;Therapeutic exercise;Patient/family education   PT Next Visit Plan elbow extension self mobilization, progrssion of elbow/wrist strength and endurance   PT Home Exercise Plan Elbow flexion with 5#, tricep extension with green TB, elbow extension self mobilization, increased to blue putty for grip strength, straight arm wall plank    Consulted and Agree with Plan of Care Patient      Patient will benefit from skilled therapeutic intervention in order to improve the following deficits and impairments:  Decreased activity tolerance, Decreased strength, Decreased range of motion, Increased fascial restricitons  Visit Diagnosis: Stiffness of right elbow,  not elsewhere classified  Muscle weakness (generalized)  Pain in right elbow     Problem List Patient Active Problem List   Diagnosis Date Noted  . Nocturnal leg cramps 09/26/2014  . Foot drop, right 09/24/2014  . 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 Upper airway cough syndrome 09/10/2014  . Aortic valve disease 11/19/2013  . Essential hypertension 11/19/2013  . Esophageal reflux 11/19/2013  . Gout 11/19/2013  . Aortic root enlargement ([place]) 11/19/2013   3:48 PM,08/12/16 Elly Modena PT, DPT [facility] [address] [place], [place], [address] Phone: [phone]   Fax:  [phone]  Name: Marvin Munoz MRN: [id number] Date of Birth: 07/29/1960

## 2016-08-17 ENCOUNTER — Ambulatory Visit (HOSPITAL_COMMUNITY): Payer: 59 | Admitting: Physical Therapy

## 2016-08-17 DIAGNOSIS — M25621 Stiffness of right elbow, not elsewhere classified: Secondary | ICD-10-CM

## 2016-08-17 DIAGNOSIS — M25521 Pain in right elbow: Secondary | ICD-10-CM

## 2016-08-17 DIAGNOSIS — M6281 Muscle weakness (generalized): Secondary | ICD-10-CM

## 2016-08-17 NOTE — Therapy (Signed)
[facility] [address] [place], [place], [address] Phone: [phone]   Fax:  [phone]  Physical Therapy Treatment  Patient Details  Name: Marvin Munoz MRN: [id number] Date of Birth: 24-Mar-1961 Referring Provider: Kathryne Hitch, MD  Encounter Date: 08/17/2016      PT End of Session - 08/17/16 0900    Visit Number 7   Number of Visits 12   Date for PT Re-Evaluation 08/26/16   Authorization Type UHC   Authorization - Visit Number 7   Authorization - Number of Visits 12   PT Start Time 0815   PT Stop Time 5188   PT Time Calculation (min) 43 min   Activity Tolerance Patient tolerated treatment well;No increased pain   Behavior During Therapy WFL for tasks assessed/performed      Past Medical History:  Diagnosis Date  . Allergic rhinitis   . Aortic insufficiency due to bicuspid aortic valve   . BPH (benign prostatic hyperplasia)   . Bursitis of elbow    gouty olecranon, right elbow  . Eczema    forearm  . Fever blister   . Foot drop, right 09/24/2014  . Gastroesophageal reflux   . Gout   . Guillain Barr syndrome ([place]) 1970's  . Guillain Barr syndrome ([place])   . Guillain-Barre syndrome ([place])   . HTN (hypertension)   . Migraine headache   . 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 Nocturnal leg cramps 09/26/2014  . PVC's (premature ventricular contractions)    hx of benign  . Restless legs syndrome (RLS)     Past Surgical History:  Procedure Laterality Date  . BREAST LUMPECTOMY Right   . CARDIAC CATHETERIZATION    . COLONOSCOPY    . JOINT REPLACEMENT Left 09/10/2015   partial knee replacement  . TENDON RECONSTRUCTION Right 06/02/2016   Procedure: right elbow extensor tendon repair and debridement and repair lateral collateral ligament;  Surgeon: Ninetta Lights, MD;  Location: [facility];  Service: Orthopedics;  Laterality: Right;  . TONSILLECTOMY      There were no vitals filed for  this visit.      Subjective Assessment - 08/17/16 0820    Subjective Pt has been doing his exercises; concentrating on strengthening more than anything.  PT states that he can still tell that his grip strength is off.     Currently in Pain? No/denies                HMP with elbow on extension stretch prior to exercises, no charge.          [facility] Adult PT Treatment/Exercise - 08/17/16 0001      Elbow Exercises   Elbow Extension Right;10 reps   Elbow Extension Limitations 3, 5 lb.    Wrist Flexion 15 reps  one set each   Other elbow exercises hand grip 1-5 3 x each   level 2 80 on RT; 97 on left      Shoulder Exercises: Prone   Other Prone Exercises plank x 30 seconds x 5 at mat.      Manual Therapy   Manual Therapy Soft tissue mobilization;Myofascial release   Manual therapy comments separate rest of session   Soft tissue mobilization Rt Distal tricep STM   Myofascial Release anterior aspect of elbow                   PT Short Term Goals - 08/12/16 4166  PT SHORT TERM GOAL #1   Title Pt to have full extension and flextion of right elbow to be able to complete normal daily activities.    Baseline ~10 deg lacking full extension   Time 3   Period Weeks   Status Partially Met     PT SHORT TERM GOAL #2   Title Pt Rt elbow strength to be increased to where pt is able to make a bed with no difficulty    Baseline Pt reports he is not avoiding using his RUE   Time 3   Period Weeks   Status Achieved     PT SHORT TERM GOAL #3   Title PT to be able to  lift a sauce pain with his right arm with no difficulty.    Time 3   Period Weeks   Status Achieved           PT Long Term Goals - 08/12/16 0835      PT LONG TERM GOAL #1   Title Pt to be able to complete gardening tasks with his right arm without difficulty    Baseline Pt has slowly started doing more of this but has not been using heavy equipment   Time 6   Period Weeks   Status  Partially Met     PT LONG TERM GOAL #2   Title Pt to have begun practice swings at the golf range without increased pain in his right elbow    Time 6   Period Weeks   Status Unable to assess     PT LONG TERM GOAL #3   Title Pt to be able to carry shopping into his house using his right arm without increased pain    Baseline Pt is able to lift light grocery bags and bring them into the house   Time 6   Period Weeks   Status Partially Met     PT LONG TERM GOAL #4   Title Pt will demo increased Rt grip strength evident by average grip strength improvement to no greater than 5 lb difference between Rt and Lt   Time 6   Period Weeks               Plan - 08/17/16 0900    Clinical Impression Statement Today's session began with HMP while on elbow extension stretch with 2# in hand for 4 minutes followed by working on hand grip with hand dynomameter.  Pt continues to improve functionally.  Treatment remains appropriate.    Rehab Potential Good   PT Frequency 2x / week   PT Duration 6 weeks   PT Treatment/Interventions ADLs/Self Care Home Management;Moist Heat;Manual techniques;Therapeutic activities;Therapeutic exercise;Patient/family education   PT Next Visit Plan elbow extension self mobilization, progrssion of elbow/wrist strength and endurance   PT Home Exercise Plan Elbow flexion with 5#, tricep extension with green TB, elbow extension self mobilization, increased to blue putty for grip strength, straight arm wall plank    Consulted and Agree with Plan of Care Patient      Patient will benefit from skilled therapeutic intervention in order to improve the following deficits and impairments:  Decreased activity tolerance, Decreased strength, Decreased range of motion, Increased fascial restricitons  Visit Diagnosis: Stiffness of right elbow, not elsewhere classified  Muscle weakness (generalized)  Pain in right elbow     Problem List Patient Active Problem List    Diagnosis Date Noted  . Nocturnal leg cramps 09/26/2014  . Foot drop, right  09/24/2014  . 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 Upper airway cough syndrome 09/10/2014  . Aortic valve disease 11/19/2013  . Essential hypertension 11/19/2013  . Esophageal reflux 11/19/2013  . Gout 11/19/2013  . Aortic root enlargement [facility]) 11/19/2013    Rayetta Humphrey, PT CLT [phone] 08/17/2016, 9:03 AM  [facility] [address] [place], [place], [address] Phone: [phone]   Fax:  [phone]  Name: AMRO WINEBARGER MRN: [id number] Date of Birth: 02-26-1961

## 2016-08-17 NOTE — Patient Instructions (Addendum)
Biceps    Stand, arms straight and fingers interlaced. Raise arms over head. Hold _10__ seconds. Repeat _5__ times per session. Do __3_ sessions per day.  Copyright  VHI. All rights reserved.

## 2016-08-22 DIAGNOSIS — M542 Cervicalgia: Secondary | ICD-10-CM | POA: Diagnosis not present

## 2016-08-23 ENCOUNTER — Ambulatory Visit (HOSPITAL_COMMUNITY): Payer: 59 | Attending: Orthopedic Surgery | Admitting: Physical Therapy

## 2016-08-23 DIAGNOSIS — M25621 Stiffness of right elbow, not elsewhere classified: Secondary | ICD-10-CM | POA: Diagnosis not present

## 2016-08-23 DIAGNOSIS — M25521 Pain in right elbow: Secondary | ICD-10-CM

## 2016-08-23 DIAGNOSIS — M6281 Muscle weakness (generalized): Secondary | ICD-10-CM | POA: Diagnosis not present

## 2016-08-23 NOTE — Therapy (Signed)
[facility] [place], [place], [address] Phone: [phone]   Fax:  [phone]  Physical Therapy Treatment  Patient Details  Name: Marvin Munoz MRN: [id number] Date of Birth: 1960-10-22 Referring Provider: Kathryne Hitch, MD  Encounter Date: 08/23/2016      PT End of Session - 08/23/16 1012    Visit Number 8   Number of Visits 12   Date for PT Re-Evaluation 08/26/16   Authorization Type [employer]   Authorization - Visit Number 8   Authorization - Number of Visits 12   PT Start Time 0904   PT Stop Time 3382   PT Time Calculation (min) 43 min   Activity Tolerance Patient tolerated treatment well;No increased pain   Behavior During Therapy WFL for tasks assessed/performed      Past Medical History:  Diagnosis Date  . Allergic rhinitis   . Aortic insufficiency due to bicuspid aortic valve   . BPH (benign prostatic hyperplasia)   . Bursitis of elbow    gouty olecranon, right elbow  . Eczema    forearm  . Fever blister   . Foot drop, right 09/24/2014  . Gastroesophageal reflux   . Gout   . Guillain Barr syndrome ([facility]) 1970's  . Guillain Barr syndrome ([place])   . Guillain-Barre syndrome ([place])   . HTN (hypertension)   . Migraine headache   . 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 Nocturnal leg cramps 09/26/2014  . PVC's (premature ventricular contractions)    hx of benign  . Restless legs syndrome (RLS)     Past Surgical History:  Procedure Laterality Date  . BREAST LUMPECTOMY Right   . CARDIAC CATHETERIZATION    . COLONOSCOPY    . JOINT REPLACEMENT Left 09/10/2015   partial knee replacement  . TENDON RECONSTRUCTION Right 06/02/2016   Procedure: right elbow extensor tendon repair and debridement and repair lateral collateral ligament;  Surgeon: Ninetta Lights, MD;  Location: [facility];  Service: Orthopedics;  Laterality: Right;  . TONSILLECTOMY      There were no vitals filed for  this visit.      Subjective Assessment - 08/23/16 1000    Subjective Pt states he woke with Lt sided cervical pain that got so bad he went to the MD yesterday and got some muscle relaxers.  States slight improvement but request to hold planks this session.  Reports no pain in his elbow and has been working on his ROM and grip strength at home.    Currently in Pain? No/denies                         [facility] Adult PT Treatment/Exercise - 08/23/16 0001      Elbow Exercises   Elbow Flexion 15 reps   Bar Weights/Barbell (Elbow Flexion) 3 lbs;5 lbs   Elbow Extension Right;15 reps   Elbow Extension Limitations 3#, 5#   Wrist Flexion 15 reps   Bar Weights/Barbell (Wrist Flexion) 3 lbs;5 lbs   Wrist Extension 15 reps   Bar Weights/Barbell (Wrist Extension) 3 lbs;5 lbs   Other elbow exercises hand grip 1-5 3 x each   level 2 LT 92# (was 80#), Rt 97#   Other elbow exercises punch ups 5# 15 reps Rt only     Shoulder Exercises: Prone   Other Prone Exercises --     Shoulder Exercises: ROM/Strengthening   UBE (Upper Arm Bike) 2'forward,  2'backward     Moist Heat Therapy   Number Minutes Moist Heat 5 Minutes   Moist Heat Location Elbow  prior to stretching and manual     Manual Therapy   Manual Therapy Soft tissue mobilization;Myofascial release   Manual therapy comments separate rest of session   Soft tissue mobilization Rt Distal tricep STM   Myofascial Release anterior aspect of elbow    Passive ROM extension of elbow                  PT Short Term Goals - 08/12/16 0834      PT SHORT TERM GOAL #1   Title Pt to have full extension and flextion of right elbow to be able to complete normal daily activities.    Baseline ~10 deg lacking full extension   Time 3   Period Weeks   Status Partially Met     PT SHORT TERM GOAL #2   Title Pt Rt elbow strength to be increased to where pt is able to make a bed with no difficulty    Baseline Pt reports he is not  avoiding using his RUE   Time 3   Period Weeks   Status Achieved     PT SHORT TERM GOAL #3   Title PT to be able to  lift a sauce pain with his right arm with no difficulty.    Time 3   Period Weeks   Status Achieved           PT Long Term Goals - 08/12/16 0835      PT LONG TERM GOAL #1   Title Pt to be able to complete gardening tasks with his right arm without difficulty    Baseline Pt has slowly started doing more of this but has not been using heavy equipment   Time 6   Period Weeks   Status Partially Met     PT LONG TERM GOAL #2   Title Pt to have begun practice swings at the golf range without increased pain in his right elbow    Time 6   Period Weeks   Status Unable to assess     PT LONG TERM GOAL #3   Title Pt to be able to carry shopping into his house using his right arm without increased pain    Baseline Pt is able to lift light grocery bags and bring them into the house   Time 6   Period Weeks   Status Partially Met     PT LONG TERM GOAL #4   Title Pt will demo increased Rt grip strength evident by average grip strength improvement to no greater than 5 lb difference between Rt and Lt   Time 6   Period Weeks               Plan - 08/23/16 1014    Clinical Impression Statement Pt verbalized good results with beginning session with moist heat last session.  Continued with this prior to manual and AAROM.  Noted improvement in hand grip with 12# increase as compared to last session.  Held planks this session per request and due to Lt cervical pain.  Ended session on UBE (not included in billing)   Rehab Potential Good   PT Frequency 2x / week   PT Duration 6 weeks   PT Treatment/Interventions ADLs/Self Care Home Management;Moist Heat;Manual techniques;Therapeutic activities;Therapeutic exercise;Patient/family education   PT Next Visit Plan elbow extension self mobilization, progrssion of  elbow/wrist strength and endurance   PT Home Exercise Plan Elbow  flexion with 5#, tricep extension with green TB, elbow extension self mobilization, increased to blue putty for grip strength, straight arm wall plank    Consulted and Agree with Plan of Care Patient      Patient will benefit from skilled therapeutic intervention in order to improve the following deficits and impairments:  Decreased activity tolerance, Decreased strength, Decreased range of motion, Increased fascial restricitons  Visit Diagnosis: Stiffness of right elbow, not elsewhere classified  Muscle weakness (generalized)  Pain in right elbow     Problem List Patient Active Problem List   Diagnosis Date Noted  . Nocturnal leg cramps 09/26/2014  . Foot drop, right 09/24/2014  . 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 Upper airway cough syndrome 09/10/2014  . Aortic valve disease 11/19/2013  . Essential hypertension 11/19/2013  . Esophageal reflux 11/19/2013  . Gout 11/19/2013  . Aortic root enlargement ([facility]) 11/19/2013    Teena Irani, PTA/CLT [phone]  08/23/2016, 10:21 AM  [facility] [address] [place], [place], [address] Phone: [phone]   Fax:  [phone]  Name: Marvin Munoz MRN: [id number] Date of Birth: 08-31-1960

## 2016-08-25 ENCOUNTER — Ambulatory Visit (HOSPITAL_COMMUNITY): Payer: 59 | Admitting: Physical Therapy

## 2016-08-25 DIAGNOSIS — M25621 Stiffness of right elbow, not elsewhere classified: Secondary | ICD-10-CM | POA: Diagnosis not present

## 2016-08-25 DIAGNOSIS — M25521 Pain in right elbow: Secondary | ICD-10-CM

## 2016-08-25 DIAGNOSIS — M6281 Muscle weakness (generalized): Secondary | ICD-10-CM

## 2016-08-25 NOTE — Therapy (Addendum)
[facility] [address] [place], [place], [address] Phone: [phone]   Fax:  [phone]  Physical Therapy Treatment  Patient Details  Name: Marvin Munoz MRN: [id number] Date of Birth: 1961-01-08 Referring Provider: Kathryne Hitch, MD  Encounter Date: 08/25/2016      PT End of Session - 08/25/16 1321    Activity Tolerance Patient tolerated treatment well;No increased pain   Behavior During Therapy WFL for tasks assessed/performed      Past Medical History:  Diagnosis Date  . Allergic rhinitis   . Aortic insufficiency due to bicuspid aortic valve   . BPH (benign prostatic hyperplasia)   . Bursitis of elbow    gouty olecranon, right elbow  . Eczema    forearm  . Fever blister   . Foot drop, right 09/24/2014  . Gastroesophageal reflux   . Gout   . Guillain Barr syndrome ([place]) 1970's  . Guillain Barr syndrome ([place])   . Guillain-Barre syndrome ([place])   . HTN (hypertension)   . Migraine headache   . 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 Nocturnal leg cramps 09/26/2014  . PVC's (premature ventricular contractions)    hx of benign  . Restless legs syndrome (RLS)     Past Surgical History:  Procedure Laterality Date  . BREAST LUMPECTOMY Right   . CARDIAC CATHETERIZATION    . COLONOSCOPY    . JOINT REPLACEMENT Left 09/10/2015   partial knee replacement  . TENDON RECONSTRUCTION Right 06/02/2016   Procedure: right elbow extensor tendon repair and debridement and repair lateral collateral ligament;  Surgeon: Ninetta Lights, MD;  Location: [facility];  Service: Orthopedics;  Laterality: Right;  . TONSILLECTOMY       Visit 9/12 Time in:    9:03 Time out:  9:46 Treatment time:   43 minutes   There were no vitals filed for this visit.      Subjective Assessment - 08/25/16 0909    Subjective Pt states his pain in his neck has overall decreased to approx 2/10 (was 10/10 at last session).   States his Rt elbow is doing well, no pain and compliant with HEP. Returns to MD 3/13 so will need re-eval next session.   Currently in Pain? No/denies            [facility] PT Assessment - 08/25/16 0001      AROM   Right Elbow Extension 8                     [facility] Adult PT Treatment/Exercise - 08/25/16 0001      Elbow Exercises   Elbow Flexion 15 reps   Bar Weights/Barbell (Elbow Flexion) 3 lbs;5 lbs   Elbow Extension Right;15 reps   Elbow Extension Limitations 3#, 5#   Wrist Flexion 15 reps   Bar Weights/Barbell (Wrist Flexion) 3 lbs;5 lbs   Wrist Extension 15 reps   Bar Weights/Barbell (Wrist Extension) 3 lbs;5 lbs   Other elbow exercises hand grip 1-5 3 x each   Rt to 110# today     Shoulder Exercises: Prone   Other Prone Exercises --     Shoulder Exercises: Standing   Other Standing Exercises straight arm wall plank x30 sec on OT table X 5 reps     Shoulder Exercises: ROM/Strengthening   UBE (Upper Arm Bike) 2'forward, 2'backward level 3     Shoulder Exercises: Body Blade   Flexion 30 seconds;2 reps  ABduction 30 seconds;2 reps   ABduction Limitations Rt only     Moist Heat Therapy   Number Minutes Moist Heat 5 Minutes   Moist Heat Location Elbow     Manual Therapy   Manual Therapy Soft tissue mobilization;Myofascial release   Manual therapy comments separate rest of session   Soft tissue mobilization Rt Distal tricep STM   Myofascial Release anterior aspect of elbow    Passive ROM extension of elbow                  PT Short Term Goals - 08/12/16 0834      PT SHORT TERM GOAL #1   Title Pt to have full extension and flextion of right elbow to be able to complete normal daily activities.    Baseline ~10 deg lacking full extension   Time 3   Period Weeks   Status Partially Met     PT SHORT TERM GOAL #2   Title Pt Rt elbow strength to be increased to where pt is able to make a bed with no difficulty    Baseline Pt reports he is  not avoiding using his RUE   Time 3   Period Weeks   Status Achieved     PT SHORT TERM GOAL #3   Title PT to be able to  lift a sauce pain with his right arm with no difficulty.    Time 3   Period Weeks   Status Achieved           PT Long Term Goals - 08/12/16 0835      PT LONG TERM GOAL #1   Title Pt to be able to complete gardening tasks with his right arm without difficulty    Baseline Pt has slowly started doing more of this but has not been using heavy equipment   Time 6   Period Weeks   Status Partially Met     PT LONG TERM GOAL #2   Title Pt to have begun practice swings at the golf range without increased pain in his right elbow    Time 6   Period Weeks   Status Unable to assess     PT LONG TERM GOAL #3   Title Pt to be able to carry shopping into his house using his right arm without increased pain    Baseline Pt is able to lift light grocery bags and bring them into the house   Time 6   Period Weeks   Status Partially Met     PT LONG TERM GOAL #4   Title Pt will demo increased Rt grip strength evident by average grip strength improvement to no greater than 5 lb difference between Rt and Lt   Time 6   Period Weeks               Plan - 08/25/16 1322    Clinical Impression Statement continues to progress well.  Added body blade with full elbow extension in flexion and abduction to work on stability.  Able to increase to 110 pound hand grip in Rt UE today.  Also achieved 8 degrees AROM following heat and manual techinques this session.  Able to resume planks due to cervical symtoms decreased.  Pt expressed readiness to be discharged at next visit.    Rehab Potential Good   PT Frequency 2x / week   PT Duration 6 weeks   PT Treatment/Interventions ADLs/Self Care Home Management;Moist Heat;Manual techniques;Therapeutic activities;Therapeutic exercise;Patient/family  education   PT Next Visit Plan Re-evaluate next session with possible discharge per patient  request.    PT Home Exercise Plan Elbow flexion with 5#, tricep extension with green TB, elbow extension self mobilization, increased to blue putty for grip strength, straight arm wall plank    Consulted and Agree with Plan of Care Patient      Patient will benefit from skilled therapeutic intervention in order to improve the following deficits and impairments:  Decreased activity tolerance, Decreased strength, Decreased range of motion, Increased fascial restricitons  Visit Diagnosis: Stiffness of right elbow, not elsewhere classified  Muscle weakness (generalized)  Pain in right elbow     Problem List Patient Active Problem List   Diagnosis Date Noted  . Nocturnal leg cramps 09/26/2014  . Foot drop, right 09/24/2014  . 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 Upper airway cough syndrome 09/10/2014  . Aortic valve disease 11/19/2013  . Essential hypertension 11/19/2013  . Esophageal reflux 11/19/2013  . Gout 11/19/2013  . Aortic root enlargement ([facility]) 11/19/2013    Teena Irani, PTA/CLT [phone]  08/25/2016, 1:24 PM  [facility] [address] [place], [place], [address] Phone: [phone]   Fax:  [phone]  Name: Marvin Munoz MRN: [id number] Date of Birth: 1960-11-12  PHYSICAL THERAPY DISCHARGE SUMMARY  Visits from Start of Care: 9  Current functional level related to goals / functional outcomes: See above   Remaining deficits: See above   Education / Equipment: HEP Plan: Patient agrees to discharge.  Patient goals were partially met. Patient is being discharged due to being pleased with the current functional level.  ?????       PT went to MD visit who agreed that pt can continue with HEP only.  Will Discharge pt at this time.  Rayetta Humphrey, [place] CLT [phone]

## 2016-08-29 ENCOUNTER — Ambulatory Visit (HOSPITAL_COMMUNITY): Payer: 59 | Admitting: Physical Therapy

## 2016-08-30 ENCOUNTER — Telehealth (HOSPITAL_COMMUNITY): Payer: Self-pay

## 2016-08-30 ENCOUNTER — Ambulatory Visit (HOSPITAL_COMMUNITY): Payer: 59

## 2016-08-30 NOTE — Telephone Encounter (Signed)
Initially called to reschedule apt today.  Pt stated he went to MD yesterday and has been released.  Pt stated he feels comfortable completeing HEP and wishes to be discharged from PT.  [address], [place]; [facility] [phone]

## 2016-09-29 DIAGNOSIS — H00021 Hordeolum internum right upper eyelid: Secondary | ICD-10-CM | POA: Diagnosis not present

## 2016-10-04 DIAGNOSIS — H00021 Hordeolum internum right upper eyelid: Secondary | ICD-10-CM | POA: Diagnosis not present

## 2016-10-24 DIAGNOSIS — M25562 Pain in left knee: Secondary | ICD-10-CM | POA: Diagnosis not present

## 2016-10-31 DIAGNOSIS — Z96652 Presence of left artificial knee joint: Secondary | ICD-10-CM | POA: Diagnosis not present

## 2016-11-21 DIAGNOSIS — Z96652 Presence of left artificial knee joint: Secondary | ICD-10-CM | POA: Diagnosis not present

## 2016-12-30 DIAGNOSIS — D225 Melanocytic nevi of trunk: Secondary | ICD-10-CM | POA: Diagnosis not present

## 2016-12-30 DIAGNOSIS — Z85828 Personal history of other malignant neoplasm of skin: Secondary | ICD-10-CM | POA: Diagnosis not present

## 2016-12-30 DIAGNOSIS — L821 Other seborrheic keratosis: Secondary | ICD-10-CM | POA: Diagnosis not present

## 2016-12-30 DIAGNOSIS — L570 Actinic keratosis: Secondary | ICD-10-CM | POA: Diagnosis not present

## 2017-01-13 DIAGNOSIS — M109 Gout, unspecified: Secondary | ICD-10-CM | POA: Diagnosis not present

## 2017-01-13 DIAGNOSIS — Z0001 Encounter for general adult medical examination with abnormal findings: Secondary | ICD-10-CM | POA: Diagnosis not present

## 2017-01-13 DIAGNOSIS — G2581 Restless legs syndrome: Secondary | ICD-10-CM | POA: Diagnosis not present

## 2017-01-13 DIAGNOSIS — Q231 Congenital insufficiency of aortic valve: Secondary | ICD-10-CM | POA: Diagnosis not present

## 2017-01-16 DIAGNOSIS — Z125 Encounter for screening for malignant neoplasm of prostate: Secondary | ICD-10-CM | POA: Diagnosis not present

## 2017-01-16 DIAGNOSIS — I10 Essential (primary) hypertension: Secondary | ICD-10-CM | POA: Diagnosis not present

## 2017-01-16 DIAGNOSIS — E785 Hyperlipidemia, unspecified: Secondary | ICD-10-CM | POA: Diagnosis not present

## 2017-01-16 DIAGNOSIS — Z79899 Other long term (current) drug therapy: Secondary | ICD-10-CM | POA: Diagnosis not present

## 2017-02-01 ENCOUNTER — Other Ambulatory Visit: Payer: Self-pay

## 2017-02-01 ENCOUNTER — Ambulatory Visit (HOSPITAL_COMMUNITY): Payer: 59 | Attending: Cardiovascular Disease

## 2017-02-01 DIAGNOSIS — I352 Nonrheumatic aortic (valve) stenosis with insufficiency: Secondary | ICD-10-CM | POA: Insufficient documentation

## 2017-02-01 DIAGNOSIS — I359 Nonrheumatic aortic valve disorder, unspecified: Secondary | ICD-10-CM | POA: Diagnosis not present

## 2017-02-01 DIAGNOSIS — I7789 Other specified disorders of arteries and arterioles: Secondary | ICD-10-CM | POA: Insufficient documentation

## 2017-02-01 HISTORY — PX: TRANSTHORACIC ECHOCARDIOGRAM: SHX275

## 2017-02-02 ENCOUNTER — Telehealth: Payer: Self-pay | Admitting: Interventional Cardiology

## 2017-02-02 NOTE — Telephone Encounter (Signed)
Made pt aware of echo results. Pt verbalized understanding.

## 2017-02-02 NOTE — Telephone Encounter (Signed)
New message     Pt is returning [facility] call for echo results

## 2017-02-05 NOTE — Progress Notes (Signed)
Cardiology Office Note    Date:  02/06/2017   ID:  Marvin Munoz 01-04-1961, MRN [id number]  PCP:  Marvin Huddle, MD  Cardiologist: Marvin Grooms, MD   Chief Complaint  Patient presents with  . Cardiac Valve Problem    History of Present Illness:  Marvin Munoz is a 56 y.o. male follow-up bicuspid aortic valve, aortic regurgitation, and hypertension.  Doing well. He is asymptomatic. Has not been as diligent exercise because of left knee mechanical issues. He had a partial knee replacement one year ago followed by hemarthrosis. That is significantly improving. He is working every day without difficulty. As we discussed last year he has very rare episodes of occasional waking with the sensation that he is smothering. After 3-5 seconds the sensation goes away. This may happen 3 times a year. There is no associated palpitation.  Past Medical History:  Diagnosis Date  . Allergic rhinitis   . Aortic insufficiency due to bicuspid aortic valve   . BPH (benign prostatic hyperplasia)   . Bursitis of elbow    gouty olecranon, right elbow  . Eczema    forearm  . Fever blister   . Foot drop, right 09/24/2014  . Gastroesophageal reflux   . Gout   . Guillain Barr syndrome ([place]) 1970's  . Guillain Barr syndrome ([place])   . Guillain-Barre syndrome ([place])   . HTN (hypertension)   . Migraine headache   . 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 Nocturnal leg cramps 09/26/2014  . PVC's (premature ventricular contractions)    hx of benign  . Restless legs syndrome (RLS)     Past Surgical History:  Procedure Laterality Date  . BREAST LUMPECTOMY Right   . CARDIAC CATHETERIZATION    . COLONOSCOPY    . JOINT REPLACEMENT Left 09/10/2015   partial knee replacement  . TENDON RECONSTRUCTION Right 06/02/2016   Procedure: right elbow extensor tendon repair and debridement and repair lateral collateral ligament;  Surgeon: Marvin Lights, MD;  Location: [facility];  Service: Orthopedics;  Laterality: Right;  . TONSILLECTOMY      Current Medications: Outpatient Medications Prior to Visit  Medication Sig Dispense Refill  . allopurinol (ZYLOPRIM) 300 MG tablet Take 300 mg by mouth daily.     [place] Kitchen aspirin 81 MG tablet Take 81 mg by mouth daily.    . Calcium Carb-Cholecalciferol (CALCIUM + D3) 600-200 MG-UNIT TABS Take by mouth 2 (two) times daily.    [place] Kitchen diltiazem (CARDIZEM CD) 240 MG 24 hr capsule Take 240 mg by mouth daily.     . fexofenadine (ALLEGRA) 180 MG tablet Take 180 mg by mouth daily.    . Multiple Vitamin (MULTIVITAMIN) tablet Take 1 tablet by mouth daily.    [place] Kitchen omeprazole (PRILOSEC) 40 MG capsule Take 40 mg by mouth daily.     . ondansetron (ZOFRAN) 4 MG tablet Take 1 tablet (4 mg total) by mouth every 8 (eight) hours as needed for nausea or vomiting. 40 tablet 0  . pramipexole (MIRAPEX) 0.25 MG tablet Take 0.25 mg by mouth at bedtime.    . psyllium (METAMUCIL) 58.6 % packet Take 1 packet by mouth daily.    . rizatriptan (MAXALT-MLT) 10 MG disintegrating tablet Take 10 mg by mouth as needed for migraine. May repeat in 2 hours if needed    . valACYclovir (VALTREX) 1000 MG tablet Take 500 mg by mouth as needed (cold sores).     [place] Kitchen  chlorpheniramine (CHLOR-TRIMETON) 4 MG tablet Take 4 mg by mouth at bedtime.    [place] Kitchen oxyCODONE-acetaminophen (PERCOCET) 5-325 MG tablet Take 1-2 tablets by mouth every 4 (four) hours as needed for severe pain. 60 tablet 0   No facility-administered medications prior to visit.      Allergies:   Colchicine; Influenza vaccines; and Adhesive [tape]   Social History   Social History  . Marital status: Married    Spouse name: N/A  . Number of children: 2  . Years of education: college   Occupational History  . Supervisor, Control and instrumentation engineer    Social History Main Topics  . Smoking status: Never Smoker  . Smokeless tobacco: Never Used  . Alcohol use 0.0 oz/week     Comment: occasional  . Drug use: No  .  Sexual activity: Yes   Other Topics Concern  . None   Social History Narrative   Patient is right handed.   Patient does not drink caffeine.     Family History:  The patient's family history includes Arthritis/Rheumatoid in his mother; Asthma in his mother; COPD in his mother; Heart attack in his father; Leukemia in his maternal grandfather; Prostate cancer in his father, maternal grandfather, and maternal uncle.   ROS:   Please see the history of present illness.    Occasional palpitations, left knee swelling, easy bruising.  All other systems reviewed and are negative.   PHYSICAL EXAM:   VS:  BP 108/74 (BP Location: Right Arm)   Pulse 64   Ht 6\' 3"  (1.905 m)   Wt 215 lb 12.8 oz (97.9 kg)   BMI 26.97 kg/m    GEN: Well nourished, well developed, in no acute distress  HEENT: normal  Neck: no JVD, carotid bruits, or masses Cardiac: RRR; 2/6 decrescendo diastolic murmur of AR. There are no rubs, gallops, and edema.  Respiratory:  clear to auscultation bilaterally, normal work of breathing GI: soft, nontender, nondistended, + BS MS: no deformity or atrophy  Skin: warm and dry, no rash Neuro:  Alert and Oriented x 3, Strength and sensation are intact Psych: euthymic mood, full affect  Wt Readings from Last 3 Encounters:  02/06/17 215 lb 12.8 oz (97.9 kg)  06/02/16 214 lb 4 oz (97.2 kg)  02/02/16 207 lb 12.8 oz (94.3 kg)      Studies/Labs Reviewed:   EKG:  EKG  Is personally reviewed and is normal with a heart rate of 64 bpm.  Recent Labs: No results found for requested labs within last 8760 hours.   Lipid Panel No results found for: CHOL, TRIG, HDL, CHOLHDL, VLDL, LDLCALC, LDLDIRECT  Additional studies/ records that were reviewed today include:  Echocardiography 02/01/2017: Study Conclusions  - Left ventricle: The cavity size was normal. Wall thickness was   normal. Systolic function was normal. The estimated ejection   fraction was in the range of 60% to 65%.  Wall motion was normal;   there were no regional wall motion abnormalities. Features are   consistent with a pseudonormal left ventricular filling pattern,   with concomitant abnormal relaxation and increased filling   pressure (grade 2 diastolic dysfunction). - Aortic valve: There was mild stenosis. There was moderate   regurgitation. - Mitral valve: Mildly to moderately calcified annulus. - Right atrium: The atrium was mildly dilated.  LV end-diastolic dimension 47 mm and end-systolic dimension is 35 mm, August 1751 LV end-diastolic dimension 45 mm and end-systolic dimension is 32 mm June 0258 LV end-diastolic dimension 40 mm  and end-systolic dimension is 31 mm May 2015  ASSESSMENT:    1. Aortic valve disease   2. Essential hypertension   3. Aortic root enlargement (HCC)      PLAN:  In order of problems listed above:  1. Stable mild to moderate aortic regurgitation. LV size and function remain normal. Clinical auscultation reveals minimal murmur. He has no limitations. Plan echocardiographic follow-up in 2 years. 2. Excellent blood pressure control. Continue close clinical follow-up. 3. Normal aortic root size by echo. On next occasion, perhaps in one year, we will do CT or MRI to reevaluate aortic root size. Thoracic aorta measured 3.9 cm in 2016  Clinical follow-up in one year. Consider MR or CT of the chest at that time. I believe we can wait 24 months before repeating an echo. I encouraged aerobic activity and disc urged isometric activity. He should call if any clinical problems.  Medication Adjustments/Labs and Tests Ordered: Current medicines are reviewed at length with the patient today.  Concerns regarding medicines are outlined above.  Medication changes, Labs and Tests ordered today are listed in the Patient Instructions below. Patient Instructions  Medication Instructions:  None  Labwork: None  Testing/Procedures: None  Follow-Up: Your physician wants you to  follow-up in: 1 year with Dr. Tamala Julian.  You will receive a reminder letter in the mail two months in advance. If you don't receive a letter, please call our office to schedule the follow-up appointment.   Any Other Special Instructions Will Be Listed Below (If Applicable).     If you need a refill on your cardiac medications before your next appointment, please call your pharmacy.      Signed, Marvin Grooms, MD  02/06/2017 9:22 AM    [facility] [place], [place], [place]  [address] Phone: [phone]; Fax: [phone]

## 2017-02-06 ENCOUNTER — Ambulatory Visit (INDEPENDENT_AMBULATORY_CARE_PROVIDER_SITE_OTHER): Payer: 59 | Admitting: Interventional Cardiology

## 2017-02-06 ENCOUNTER — Encounter: Payer: Self-pay | Admitting: Interventional Cardiology

## 2017-02-06 VITALS — BP 108/74 | HR 64 | Ht 75.0 in | Wt 215.8 lb

## 2017-02-06 DIAGNOSIS — I7789 Other specified disorders of arteries and arterioles: Secondary | ICD-10-CM | POA: Diagnosis not present

## 2017-02-06 DIAGNOSIS — I10 Essential (primary) hypertension: Secondary | ICD-10-CM | POA: Diagnosis not present

## 2017-02-06 DIAGNOSIS — I359 Nonrheumatic aortic valve disorder, unspecified: Secondary | ICD-10-CM

## 2017-02-06 NOTE — Patient Instructions (Signed)

## 2017-06-21 DIAGNOSIS — H6092 Unspecified otitis externa, left ear: Secondary | ICD-10-CM | POA: Diagnosis not present

## 2017-06-26 DIAGNOSIS — R361 Hematospermia: Secondary | ICD-10-CM | POA: Diagnosis not present

## 2017-06-26 DIAGNOSIS — H6990 Unspecified Eustachian tube disorder, unspecified ear: Secondary | ICD-10-CM | POA: Diagnosis not present

## 2017-07-03 DIAGNOSIS — H6983 Other specified disorders of Eustachian tube, bilateral: Secondary | ICD-10-CM | POA: Diagnosis not present

## 2017-08-14 DIAGNOSIS — L308 Other specified dermatitis: Secondary | ICD-10-CM | POA: Diagnosis not present

## 2017-08-14 DIAGNOSIS — Z85828 Personal history of other malignant neoplasm of skin: Secondary | ICD-10-CM | POA: Diagnosis not present

## 2017-08-14 DIAGNOSIS — L821 Other seborrheic keratosis: Secondary | ICD-10-CM | POA: Diagnosis not present

## 2017-08-14 DIAGNOSIS — L570 Actinic keratosis: Secondary | ICD-10-CM | POA: Diagnosis not present

## 2017-11-09 DIAGNOSIS — M722 Plantar fascial fibromatosis: Secondary | ICD-10-CM | POA: Diagnosis not present

## 2017-12-27 DIAGNOSIS — M25562 Pain in left knee: Secondary | ICD-10-CM | POA: Diagnosis not present

## 2017-12-27 DIAGNOSIS — Z96652 Presence of left artificial knee joint: Secondary | ICD-10-CM | POA: Diagnosis not present

## 2018-01-12 ENCOUNTER — Other Ambulatory Visit (HOSPITAL_COMMUNITY): Payer: Self-pay | Admitting: Orthopedic Surgery

## 2018-01-12 DIAGNOSIS — T84033A Mechanical loosening of internal left knee prosthetic joint, initial encounter: Secondary | ICD-10-CM

## 2018-01-17 ENCOUNTER — Encounter (HOSPITAL_COMMUNITY): Payer: 59

## 2018-01-18 ENCOUNTER — Encounter (HOSPITAL_COMMUNITY)
Admission: RE | Admit: 2018-01-18 | Discharge: 2018-01-18 | Disposition: A | Discharge status: Home / Self Care | Payer: 59 | Source: Ambulatory Visit | Attending: Orthopedic Surgery | Admitting: Orthopedic Surgery

## 2018-01-18 DIAGNOSIS — Y838 Other surgical procedures as the cause of abnormal reaction of the patient, or of later complication, without mention of misadventure at the time of the procedure: Secondary | ICD-10-CM | POA: Insufficient documentation

## 2018-01-18 DIAGNOSIS — T84033A Mechanical loosening of internal left knee prosthetic joint, initial encounter: Secondary | ICD-10-CM | POA: Diagnosis not present

## 2018-01-18 IMAGING — NM NM BONE 3 PHASE
9 series · 19 of 19 positions shown · non-contrast
Comparison: None

Radiographic correlation: None

CLINICAL DATA: LEFT knee pain, prior partial LEFT knee replacement
surgery in [W7]

EXAM:
NUCLEAR MEDICINE 3-PHASE BONE SCAN
TECHNIQUE: Radionuclide angiographic images, immediate static blood pool
images, and 3-hour delayed static images were obtained of the knees
after intravenous injection of radiopharmaceutical.
RADIOPHARMACEUTICALS:  20.5 mCi [W7] MDP IV

[Series 1: flow · 2.07mm/px · 6 of 48 frames shown (1 of 2)]
[frame 5/48  full-range]
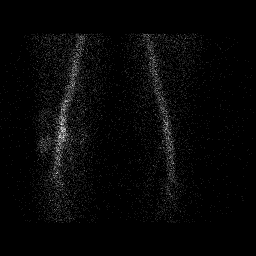
[frame 13/48  full-range]
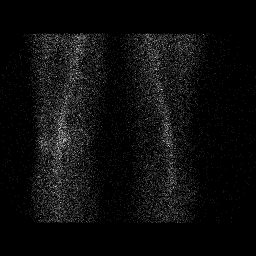
[frame 21/48  full-range]
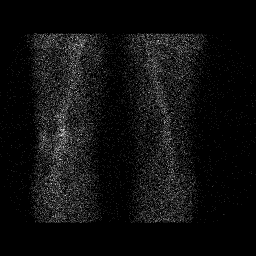
[frame 29/48  full-range]
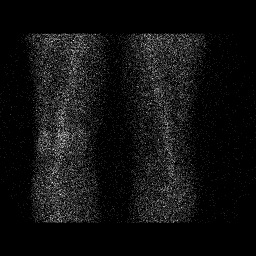
[frame 37/48  full-range]
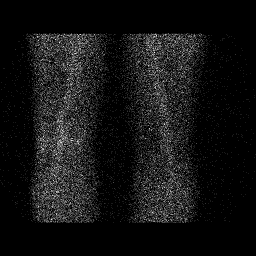
[frame 45/48  full-range]
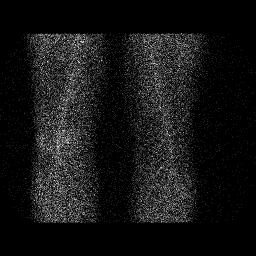

[Series 1: flow · 2.07mm/px · 6 of 48 frames shown (2 of 2)]
[frame 5/48  full-range]
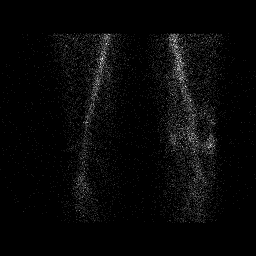
[frame 13/48  full-range]
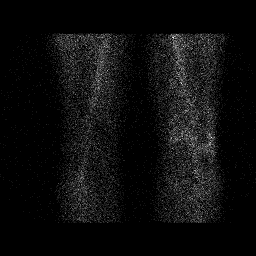
[frame 21/48  full-range]
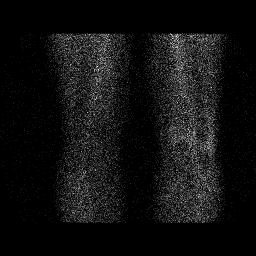
[frame 29/48  full-range]
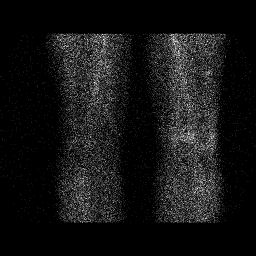
[frame 37/48  full-range]
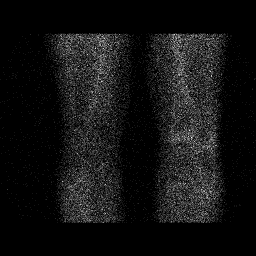
[frame 45/48  full-range]
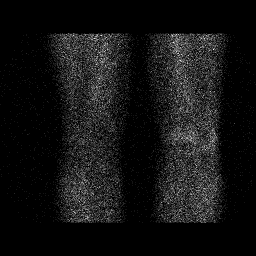

[Series 2: blood pool · 2.07mm/px · 1 of 1 slices shown (1 of 2)]
[im 1/1]
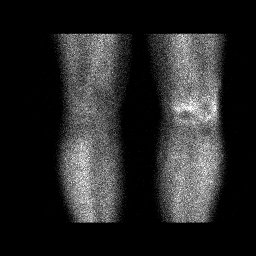

[Series 2: blood pool · 2.07mm/px · 1 of 1 slices shown (2 of 2)]
[im 1/1  full-range]
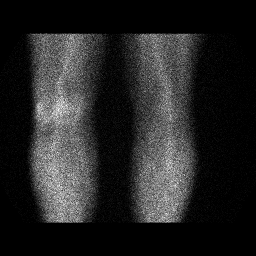

[Series 3: lat bp · 2.07mm/px · 1 of 1 slices shown (1 of 2)]
[im 1/1]
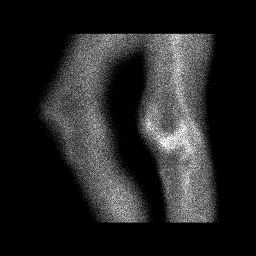

[Series 3: lat bp · 2.07mm/px · 1 of 1 slices shown (2 of 2)]
[im 1/1  full-range]
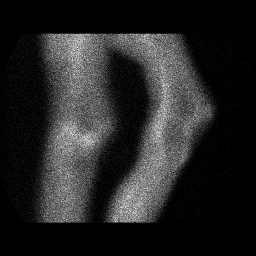

[Series 4: delay · delayed · 2.07mm/px · 1 of 1 slices shown (1 of 3)]
[im 1/1]
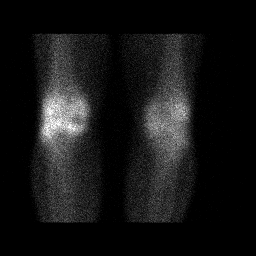

[Series 5: delay · delayed · 2.07mm/px · 1 of 1 slices shown (2 of 3)]
[im 1/1]
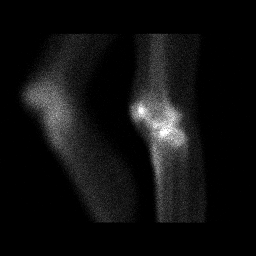

[Series 5: delay · delayed · 2.07mm/px · 1 of 1 slices shown (3 of 3)]
[im 1/1]
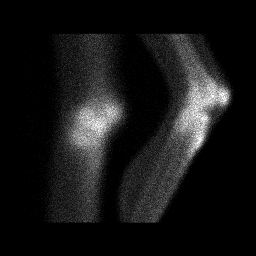

[19 of 19 positions shown; findings below may reference images not displayed]

FINDINGS: Vascular phase: Increased blood flow to LEFT knee

Blood pool phase: Increased blood pool at LEFT knee

Delayed phase: Photopenic defect at medial compartment LEFT knee
question a unicompartmental prosthesis; no radiographs for
correlation. Focally increased tracer localization at the medial
femoral condyle adjacent to the the potential prosthesis at the
medial compartment of the LEFT knee concerning for prosthetic
loosening. Mild diffuse uptake about the remainder of the LEFT knee
joint is nonspecific.
IMPRESSION: Increased blood flow and blood pool to the LEFT knee question
synovitis.

Focally increased tracer localization at medial femoral condyle
adjacent to a photopenic defect which is suspected to represent a
unicompartmental prosthesis of the medial compartment LEFT knee,
suspicious for aseptic loosening or infection of the LEFT knee
prosthesis.

## 2018-01-18 MED ORDER — TECHNETIUM TC 99M MEDRONATE IV KIT
20.0000 | PACK | Freq: Once | INTRAVENOUS | Status: AC | PRN
  Administered 2018-01-18: 20 via INTRAVENOUS

## 2018-01-30 ENCOUNTER — Telehealth: Payer: Self-pay

## 2018-01-30 NOTE — Telephone Encounter (Signed)
   [facility] Pre-operative Risk Assessment    Request for surgical clearance:  1. What type of surgery is being performed? Left Revision Total Knee Replacement    2. When is this surgery scheduled? Pending Clearance   3. What type of clearance is required (medical clearance vs. Pharmacy clearance to hold med vs. Both)? Medical Clearance   4. Are there any medications that need to be held prior to surgery and how long? None listed   5. Practice name and name of physician performing surgery? [facility] - Dr. Marchia Bond   6. What is your office phone number [phone] ext: [phone] ATTN: Sherri    7.   What is your office fax number [phone] ATTN: Sherri  8.   Anesthesia type (None, local, MAC, general) ? None Listed   Marvin Munoz 01/30/2018, 9:35 AM  _________________________________________________________________   (provider comments below)

## 2018-01-31 NOTE — Telephone Encounter (Signed)
Left message for patient to contact office to schedule appointment for cardiac clearance.

## 2018-01-31 NOTE — Telephone Encounter (Signed)
   Primary Cardiologist:Dr Tamala Julian  Chart reviewed as part of pre-operative protocol coverage. Because of Randel Hargens Musial's past medical history and time since last visit, he/she will require a follow-up visit in order to better assess preoperative cardiovascular risk.  Pre-op covering staff: - Please schedule appointment with Dr Tamala Julian or an APP at [facility] and call patient to inform them. - Please contact requesting surgeon's office via preferred method (i.e, phone, fax) to inform them of need for appointment prior to surgery.  Kerin Ransom, PA-C  01/31/2018, 1:16 PM

## 2018-02-01 NOTE — Telephone Encounter (Signed)
Pre-op clearance scheduled with Tera Helper., NP on 02/05/18 @ 1:30pm

## 2018-02-02 ENCOUNTER — Encounter (HOSPITAL_COMMUNITY): Payer: 59

## 2018-02-02 ENCOUNTER — Other Ambulatory Visit (HOSPITAL_COMMUNITY): Payer: 59

## 2018-02-05 ENCOUNTER — Encounter: Payer: Self-pay | Admitting: Nurse Practitioner

## 2018-02-05 ENCOUNTER — Ambulatory Visit: Payer: 59 | Admitting: Nurse Practitioner

## 2018-02-05 VITALS — BP 110/80 | HR 65 | Ht 74.5 in | Wt 220.1 lb

## 2018-02-05 DIAGNOSIS — I712 Thoracic aortic aneurysm, without rupture, unspecified: Secondary | ICD-10-CM

## 2018-02-05 DIAGNOSIS — Z01818 Encounter for other preprocedural examination: Secondary | ICD-10-CM

## 2018-02-05 DIAGNOSIS — I10 Essential (primary) hypertension: Secondary | ICD-10-CM

## 2018-02-05 DIAGNOSIS — I359 Nonrheumatic aortic valve disorder, unspecified: Secondary | ICD-10-CM

## 2018-02-05 NOTE — Patient Instructions (Addendum)
We will be checking the following labs today - NONE  Please get lab from Dr. Inda Merlin for the CT scan   Medication Instructions:    Continue with your current medicines.     Testing/Procedures To Be Arranged:  N/A  Follow-Up:   See Dr. Tamala Julian in one year  Echo prior to visit in one year     Other Special Instructions:   You are given the ok to proceed with your surgery from our standpoint. I will send a note to Dr. Mardelle Matte    If you need a refill on your cardiac medications before your next appointment, please call your pharmacy.   Call the [place] office at [phone] if you have any questions, problems or concerns.

## 2018-02-05 NOTE — Progress Notes (Signed)
CARDIOLOGY OFFICE NOTE  Date:  02/05/2018    Marvin Munoz Date of Birth: 1960/09/17 Medical Record #[id number]  PCP:  Josetta Huddle, MD  Cardiologist:  Tamala Julian    Chief Complaint  Patient presents with  . Pre-op Exam    Seen for Dr. Tamala Julian    History of Present Illness: Marvin Munoz is a 57 y.o. male who presents today for a pre op clearance visit. Seen for Dr. Tamala Julian.   He has a history of a bicuspid aortic valve, aortic regurgitation and HTN.   Last seen a year ago - has had very rare episodes of occasional waking with the sensation that he is smothering - lasts 3 to 5 seconds. This may happen 3 times a year and has not changed/worsened. Overall, felt to be doing ok from our standpoint. Dr. Tamala Julian did wish to consider CT/MRA imaging of the aorta on return.   Comes in today. Here alone. Planning on left knee surgery - with Dr. Mardelle Matte - not scheduled yet - exact procedure unknown - could be scope/partial replacement/total replacement. He is not able to have MRI due to hardware already placed in his left knee. Saw PCP last week for medical clearance. Had lab last week. He is doing well from our standpoint. No chest pain. Breathing is good. Rare palpitations - for the most part his Diltiazem keeps this under control. He remains active - pushing mowing the lawn. Can walk up a flight of stairs and 2 blocks if asked. No passing out. Rare "smothery feeling" that is totally unchanged. He feels like he is doing well.   Past Medical History:  Diagnosis Date  . Allergic rhinitis   . Aortic insufficiency due to bicuspid aortic valve   . BPH (benign prostatic hyperplasia)   . Bursitis of elbow    gouty olecranon, right elbow  . Eczema    forearm  . Fever blister   . Foot drop, right 09/24/2014  . Gastroesophageal reflux   . Gout   . Guillain Barr syndrome ([place]) 1970's  . Guillain Barr syndrome ([place])   . Guillain-Barre syndrome ([place])   . HTN (hypertension)   . Migraine headache   .  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 Nocturnal leg cramps 09/26/2014  . PVC's (premature ventricular contractions)    hx of benign  . Restless legs syndrome (RLS)     Past Surgical History:  Procedure Laterality Date  . BREAST LUMPECTOMY Right   . CARDIAC CATHETERIZATION    . COLONOSCOPY    . JOINT REPLACEMENT Left 09/10/2015   partial knee replacement  . TENDON RECONSTRUCTION Right 06/02/2016   Procedure: right elbow extensor tendon repair and debridement and repair lateral collateral ligament;  Surgeon: Ninetta Lights, MD;  Location: [facility];  Service: Orthopedics;  Laterality: Right;  . TONSILLECTOMY       Medications: Current Meds  Medication Sig  . allopurinol (ZYLOPRIM) 300 MG tablet Take 300 mg by mouth daily.   Marland Kitchen aspirin 81 MG tablet Take 81 mg by mouth daily.  . Calcium Carb-Cholecalciferol (CALCIUM + D3) 600-200 MG-UNIT TABS Take by mouth 2 (two) times daily.  Marland Kitchen diltiazem (CARDIZEM CD) 240 MG 24 hr capsule Take 240 mg by mouth daily.   . fexofenadine (ALLEGRA) 180 MG tablet Take 180 mg by mouth daily.  . fluticasone (FLONASE) 50 MCG/ACT nasal spray   . Multiple Vitamin (MULTIVITAMIN) tablet Take 1 tablet by mouth daily.  Marland Kitchen  omeprazole (PRILOSEC) 40 MG capsule Take 40 mg by mouth daily.   . ondansetron (ZOFRAN) 4 MG tablet Take 1 tablet (4 mg total) by mouth every 8 (eight) hours as needed for nausea or vomiting.  . pramipexole (MIRAPEX) 0.25 MG tablet Take 0.25 mg by mouth at bedtime.  . psyllium (METAMUCIL) 58.6 % packet Take 1 packet by mouth daily.  . rizatriptan (MAXALT-MLT) 10 MG disintegrating tablet Take 10 mg by mouth as needed for migraine. May repeat in 2 hours if needed  . valACYclovir (VALTREX) 1000 MG tablet Take 500 mg by mouth as needed (cold sores).      Allergies: Allergies  Allergen Reactions  . Colchicine Diarrhea  . Influenza Vaccines     Guillain-barre  . Adhesive [Tape] Rash    Social History: The  patient  reports that he has never smoked. He has never used smokeless tobacco. He reports that he drinks alcohol. He reports that he does not use drugs.   Family History: The patient's family history includes Arthritis/Rheumatoid in his mother; Asthma in his mother; COPD in his mother; Heart attack in his father; Leukemia in his maternal grandfather; Prostate cancer in his father, maternal grandfather, and maternal uncle.   Review of Systems: Please see the history of present illness.   Otherwise, the review of systems is positive for none.   All other systems are reviewed and negative.   Physical Exam: VS:  BP 110/80 (BP Location: Left Arm, Patient Position: Sitting, Cuff Size: Normal)   Pulse 65   Ht 6' 2.5" (1.892 m)   Wt 220 lb 1.9 oz (99.8 kg)   BMI 27.88 kg/m  .  BMI Body mass index is 27.88 kg/m.  Wt Readings from Last 3 Encounters:  02/05/18 220 lb 1.9 oz (99.8 kg)  02/06/17 215 lb 12.8 oz (97.9 kg)  06/02/16 214 lb 4 oz (97.2 kg)    General: Pleasant. Well developed, well nourished and in no acute distress.   HEENT: Normal.  Neck: Supple, no JVD, carotid bruits, or masses noted.  Cardiac: Regular rate and rhythm. Soft systolic murmur. No edema.  Respiratory:  Lungs are clear to auscultation bilaterally with normal work of breathing.  GI: Soft and nontender.  MS: No deformity or atrophy. Gait and ROM intact.  Skin: Warm and dry. Color is normal.  Neuro:  Strength and sensation are intact and no gross focal deficits noted.  Psych: Alert, appropriate and with normal affect.   LABORATORY DATA:  EKG:  EKG is ordered today. This demonstrates NSR - reviewed with Dr. Tamala Julian.  No results found for: WBC, HGB, HCT, PLT, GLUCOSE, CHOL, TRIG, HDL, LDLDIRECT, LDLCALC, ALT, AST, NA, K, CL, CREATININE, BUN, CO2, TSH, PSA, INR, GLUF, HGBA1C, MICROALBUR     BNP (last 3 results) No results for input(s): BNP in the last 8760 hours.  ProBNP (last 3 results) No results for  input(s): PROBNP in the last 8760 hours.   Other Studies Reviewed Today:  Echocardiography 02/01/2017: Study Conclusions  - Left ventricle: The cavity size was normal. Wall thickness was normal. Systolic function was normal. The estimated ejection fraction was in the range of 60% to 65%. Wall motion was normal; there were no regional wall motion abnormalities. Features are consistent with a pseudonormal left ventricular filling pattern, with concomitant abnormal relaxation and increased filling pressure (grade 2 diastolic dysfunction). - Aortic valve: There was mild stenosis. There was moderate regurgitation. - Mitral valve: Mildly to moderately calcified annulus. - Right atrium: The  atrium was mildly dilated.  LV end-diastolic dimension 47 mm and end-systolic dimension is 35 mm, August 6256 LV end-diastolic dimension 45 mm and end-systolic dimension is 32 mm June 3893 LV end-diastolic dimension 40 mm and end-systolic dimension is 31 mm May 2015   Assessment/Plan:  1. Pre op clearance - for knee surgery - date TBD. He is probably not going to be able to do this until November due to work commitments. He is doing well from our standpoint. He can complete over 4 mets of activity. No further testing felt to be warranted - discussed with Dr. Tamala Julian who agrees. Will be available as needed.   2. HTN - BP is great on current regimen. No changes made.   3. Aortic valve disease - last echo a year ago - to get repeated in one year. Discussed with Dr. Tamala Julian. Symptoms are all stable. Plan to repeat echo in one year. See Dr. Tamala Julian in one year. Will get CTA of the aorta as previously recommended.   Current medicines are reviewed with the patient today.  The patient does not have concerns regarding medicines other than what has been noted above.  The following changes have been made:  See above.  Labs/ tests ordered today include:    Orders Placed This Encounter  Procedures    . EKG 12-Lead     Disposition:   FU with Dr. Tamala Julian in one year.    Patient is agreeable to this plan and will call if any problems develop in the interim.   SignedTruitt Merle, NP  02/05/2018 1:44 PM  [facility] [address] [place], [place]  [address] Phone: [phone] Fax: [phone]

## 2018-02-20 ENCOUNTER — Inpatient Hospital Stay: Admission: RE | Admit: 2018-02-20 | Payer: 59 | Source: Ambulatory Visit

## 2018-02-21 ENCOUNTER — Ambulatory Visit (INDEPENDENT_AMBULATORY_CARE_PROVIDER_SITE_OTHER)
Admission: RE | Admit: 2018-02-21 | Discharge: 2018-02-21 | Disposition: A | Discharge status: Home / Self Care | Payer: 59 | Source: Ambulatory Visit | Attending: Nurse Practitioner | Admitting: Nurse Practitioner

## 2018-02-21 ENCOUNTER — Other Ambulatory Visit: Payer: Self-pay | Admitting: Orthopedic Surgery

## 2018-02-21 DIAGNOSIS — I712 Thoracic aortic aneurysm, without rupture, unspecified: Secondary | ICD-10-CM

## 2018-02-21 MED ORDER — IOPAMIDOL (ISOVUE-370) INJECTION 76%
100.0000 mL | Freq: Once | INTRAVENOUS | Status: AC | PRN
  Administered 2018-02-21: 100 mL via INTRAVENOUS

## 2018-02-26 ENCOUNTER — Telehealth: Payer: Self-pay | Admitting: Nurse Practitioner

## 2018-02-26 NOTE — Telephone Encounter (Signed)
New Message    Patient is calling to obtain the results of his CT that he had on 09/04. Please call to discuss.

## 2018-03-22 ENCOUNTER — Ambulatory Visit: Payer: 59 | Admitting: Interventional Cardiology

## 2018-05-08 ENCOUNTER — Encounter (HOSPITAL_COMMUNITY): Payer: Self-pay

## 2018-05-08 NOTE — Progress Notes (Signed)
Clearance Leisa Lenz NP 02-05-18 in epic Cardiology EKG 02-05-18 in epic  CTA chaet 02-22-18 epic  Echo 02-01-17 epic

## 2018-05-08 NOTE — Patient Instructions (Addendum)
LENN VOLKER  1960/08/27    Your procedure is scheduled on: 05-15-18   Report to [facility] Main  Entrance,  Report to admitting at     5:30   AM    Call this number if you have problems the morning of surgery [phone]       Remember: Do not eat food or drink liquids :After Midnight.                                         BRUSH YOUR TEETH MORNING OF SURGERY AND RINSE YOUR MOUTH OUT, NO CHEWING GUM CANDY OR MINTS.     Take these medicines the morning of surgery with A SIP OF WATER: omeprazole, zyrtec                                 You may not have any metal on your body including hair pins and              piercings  Do not wear jewelry, lotions, powders or perfumes, deodorant                   Men may shave face and neck.      Do not bring valuables to the hospital. [place].  Contacts, dentures or bridgework may not be worn into surgery.  Leave suitcase in the car. After surgery it may be brought to your room.     _____________________________________________________________________  [facility] - Preparing for Surgery Before surgery, you can play an important role.  Because skin is not sterile, your skin needs to be as free of germs as possible.  You can reduce the number of germs on your skin by washing with CHG (chlorahexidine gluconate) soap before surgery.  CHG is an antiseptic cleaner which kills germs and bonds with the skin to continue killing germs even after washing. Please DO NOT use if you have an allergy to CHG or antibacterial soaps.  If your skin becomes reddened/irritated stop using the CHG and inform your nurse when you arrive at Short Stay. Do not shave (including legs and underarms) for at least 48 hours prior to the first CHG shower.  You may shave your face/neck. Please follow these instructions carefully:  1.  Shower with CHG Soap the night before surgery and the   morning of Surgery.  2.  If you choose to wash your hair, wash your hair first as usual with your  normal  shampoo.  3.  After you shampoo, rinse your hair and body thoroughly to remove the  shampoo.                            4.  Use CHG as you would any other liquid soap.  You can apply chg directly  to the skin and wash                       Gently with a scrungie or clean washcloth.  5.  Apply the CHG Soap to your body ONLY FROM THE NECK  DOWN.   Do not use on face/ open                           Wound or open sores. Avoid contact with eyes, ears mouth and genitals (private parts).                       Wash face,  Genitals (private parts) with your normal soap.             6.  Wash thoroughly, paying special attention to the area where your surgery  will be performed.  7.  Thoroughly rinse your body with warm water from the neck down.  8.  DO NOT shower/wash with your normal soap after using and rinsing off  the CHG Soap.             9.  Pat yourself dry with a clean towel.            10.  Wear clean pajamas.            11.  Place clean sheets on your bed the night of your first shower and do not  sleep with pets. Day of Surgery : Do not apply any lotions/deodorants the morning of surgery.  Please wear clean clothes to the hospital/surgery center.  FAILURE TO FOLLOW THESE INSTRUCTIONS MAY RESULT IN THE CANCELLATION OF YOUR SURGERY PATIENT SIGNATURE_________________________________  NURSE SIGNATURE__________________________________  ________________________________________________________________________             WHAT IS A BLOOD TRANSFUSION? Blood Transfusion Information  A transfusion is the replacement of blood or some of its parts. Blood is made up of multiple cells which provide different functions.  Red blood cells carry oxygen and are used for blood loss replacement.  White blood cells fight against infection.  Platelets control bleeding.  Plasma helps clot  blood.  Other blood products are available for specialized needs, such as hemophilia or other clotting disorders. BEFORE THE TRANSFUSION  Who gives blood for transfusions?   Healthy volunteers who are fully evaluated to make sure their blood is safe. This is blood bank blood. Transfusion therapy is the safest it has ever been in the practice of medicine. Before blood is taken from a donor, a complete history is taken to make sure that person has no history of diseases nor engages in risky social behavior (examples are intravenous drug use or sexual activity with multiple partners). The donor's travel history is screened to minimize risk of transmitting infections, such as malaria. The donated blood is tested for signs of infectious diseases, such as HIV and hepatitis. The blood is then tested to be sure it is compatible with you in order to minimize the chance of a transfusion reaction. If you or a relative donates blood, this is often done in anticipation of surgery and is not appropriate for emergency situations. It takes many days to process the donated blood. RISKS AND COMPLICATIONS Although transfusion therapy is very safe and saves many lives, the main dangers of transfusion include:   Getting an infectious disease.  Developing a transfusion reaction. This is an allergic reaction to something in the blood you were given. Every precaution is taken to prevent this. The decision to have a blood transfusion has been considered carefully by your caregiver before blood is given. Blood is not given unless the benefits outweigh the risks. AFTER THE TRANSFUSION  Right after receiving a blood transfusion, you  will usually feel much better and more energetic. This is especially true if your red blood cells have gotten low (anemic). The transfusion raises the level of the red blood cells which carry oxygen, and this usually causes an energy increase.  The nurse administering the transfusion will monitor  you carefully for complications. HOME CARE INSTRUCTIONS  No special instructions are needed after a transfusion. You may find your energy is better. Speak with your caregiver about any limitations on activity for underlying diseases you may have. SEEK MEDICAL CARE IF:   Your condition is not improving after your transfusion.  You develop redness or irritation at the intravenous (IV) site. SEEK IMMEDIATE MEDICAL CARE IF:  Any of the following symptoms occur over the next 12 hours:  Shaking chills.  You have a temperature by mouth above 102 F (38.9 C), not controlled by medicine.  Chest, back, or muscle pain.  People around you feel you are not acting correctly or are confused.  Shortness of breath or difficulty breathing.  Dizziness and fainting.  You get a rash or develop hives.  You have a decrease in urine output.  Your urine turns a dark color or changes to pink, red, or brown. Any of the following symptoms occur over the next 10 days:  You have a temperature by mouth above 102 F (38.9 C), not controlled by medicine.  Shortness of breath.  Weakness after normal activity.  The white part of the eye turns yellow (jaundice).  You have a decrease in the amount of urine or are urinating less often.  Your urine turns a dark color or changes to pink, red, or brown. Document Released: 06/03/2000 Document Revised: 08/29/2011 Document Reviewed: 01/21/2008 ExitCare Patient Information 2014 [place].  _______________________________________________________________________  Incentive Spirometer  An incentive spirometer is a tool that can help keep your lungs clear and active. This tool measures how well you are filling your lungs with each breath. Taking long deep breaths may help reverse or decrease the chance of developing breathing (pulmonary) problems (especially infection) following:  A long period of time when you are unable to move or be active. BEFORE THE  PROCEDURE   If the spirometer includes an indicator to show your best effort, your nurse or respiratory therapist will set it to a desired goal.  If possible, sit up straight or lean slightly forward. Try not to slouch.  Hold the incentive spirometer in an upright position. INSTRUCTIONS FOR USE  1. Sit on the edge of your bed if possible, or sit up as far as you can in bed or on a chair. 2. Hold the incentive spirometer in an upright position. 3. Breathe out normally. 4. Place the mouthpiece in your mouth and seal your lips tightly around it. 5. Breathe in slowly and as deeply as possible, raising the piston or the ball toward the top of the column. 6. Hold your breath for 3-5 seconds or for as long as possible. Allow the piston or ball to fall to the bottom of the column. 7. Remove the mouthpiece from your mouth and breathe out normally. 8. Rest for a few seconds and repeat Steps 1 through 7 at least 10 times every 1-2 hours when you are awake. Take your time and take a few normal breaths between deep breaths. 9. The spirometer may include an indicator to show your best effort. Use the indicator as a goal to work toward during each repetition. 10. After each set of 10 deep breaths, practice  coughing to be sure your lungs are clear. If you have an incision (the cut made at the time of surgery), support your incision when coughing by placing a pillow or rolled up towels firmly against it. Once you are able to get out of bed, walk around indoors and cough well. You may stop using the incentive spirometer when instructed by your caregiver.  RISKS AND COMPLICATIONS  Take your time so you do not get dizzy or light-headed.  If you are in pain, you may need to take or ask for pain medication before doing incentive spirometry. It is harder to take a deep breath if you are having pain. AFTER USE  Rest and breathe slowly and easily.  It can be helpful to keep track of a log of your progress. Your  caregiver can provide you with a simple table to help with this. If you are using the spirometer at home, follow these instructions: [place] IF:   You are having difficultly using the spirometer.  You have trouble using the spirometer as often as instructed.  Your pain medication is not giving enough relief while using the spirometer.  You develop fever of 100.5 F (38.1 C) or higher. SEEK IMMEDIATE MEDICAL CARE IF:   You cough up bloody sputum that had not been present before.  You develop fever of 102 F (38.9 C) or greater.  You develop worsening pain at or near the incision site. MAKE SURE YOU:   Understand these instructions.  Will watch your condition.  Will get help right away if you are not doing well or get worse. Document Released: 10/17/2006 Document Revised: 08/29/2011 Document Reviewed: 12/18/2006 [facility] Patient Information 2014 [place], [place].   ________________________________________________________________________

## 2018-05-09 ENCOUNTER — Other Ambulatory Visit: Payer: Self-pay

## 2018-05-09 ENCOUNTER — Encounter (HOSPITAL_COMMUNITY): Payer: Self-pay

## 2018-05-09 ENCOUNTER — Encounter (HOSPITAL_COMMUNITY)
Admission: RE | Admit: 2018-05-09 | Discharge: 2018-05-09 | Disposition: A | Discharge status: Home / Self Care | Payer: 59 | Source: Ambulatory Visit | Attending: Orthopedic Surgery | Admitting: Orthopedic Surgery

## 2018-05-09 ENCOUNTER — Other Ambulatory Visit (HOSPITAL_COMMUNITY): Payer: 59

## 2018-05-09 DIAGNOSIS — Z01812 Encounter for preprocedural laboratory examination: Secondary | ICD-10-CM | POA: Diagnosis not present

## 2018-05-09 HISTORY — DX: Thoracic aortic aneurysm, without rupture: I71.2

## 2018-05-09 HISTORY — DX: Aneurysm of the ascending aorta, without rupture: I71.21

## 2018-05-09 HISTORY — DX: Unspecified osteoarthritis, unspecified site: M19.90

## 2018-05-09 HISTORY — DX: Nausea with vomiting, unspecified: R11.2

## 2018-05-09 HISTORY — DX: Nonrheumatic aortic (valve) stenosis: I35.0

## 2018-05-09 HISTORY — DX: Other specified postprocedural states: Z98.890

## 2018-05-09 HISTORY — DX: Nocturia: R35.1

## 2018-05-09 LAB — CBC
HCT: 42.5 % (ref 39.0–52.0)
Hemoglobin: 14.1 g/dL (ref 13.0–17.0)
MCH: 32.2 pg (ref 26.0–34.0)
MCHC: 33.2 g/dL (ref 30.0–36.0)
MCV: 97 fL (ref 80.0–100.0)
PLATELETS: 214 10*3/uL (ref 150–400)
RBC: 4.38 MIL/uL (ref 4.22–5.81)
RDW: 12 % (ref 11.5–15.5)
WBC: 5.4 10*3/uL (ref 4.0–10.5)
nRBC: 0 % (ref 0.0–0.2)

## 2018-05-09 LAB — BASIC METABOLIC PANEL
Anion gap: 6 (ref 5–15)
BUN: 15 mg/dL (ref 6–20)
CO2: 28 mmol/L (ref 22–32)
Calcium: 9.5 mg/dL (ref 8.9–10.3)
Chloride: 109 mmol/L (ref 98–111)
Creatinine, Ser: 1.1 mg/dL (ref 0.61–1.24)
GFR calc Af Amer: >60 mL/min (ref >60)
GFR calc non Af Amer: >60 mL/min (ref >60)
GLUCOSE: 91 mg/dL (ref 70–99)
POTASSIUM: 4.1 mmol/L (ref 3.5–5.1)
Sodium: 143 mmol/L (ref 135–145)

## 2018-05-09 LAB — SURGICAL PCR SCREEN
MRSA, PCR: NEGATIVE
STAPHYLOCOCCUS AUREUS: NEGATIVE

## 2018-05-09 LAB — ABO/RH: ABO/RH(D): B POS

## 2018-05-14 NOTE — Anesthesia Preprocedure Evaluation (Addendum)
Anesthesia Evaluation  Patient identified by MRN, date of birth, ID band Patient awake    Reviewed: Allergy & Precautions, NPO status , Patient's Chart, lab work & pertinent test results  History of Anesthesia Complications (+) PONV and history of anesthetic complications  Airway Mallampati: II  TM Distance: >3 FB Neck ROM: Full    Dental  (+) Dental Advisory Given, Teeth Intact   Pulmonary neg pulmonary ROS,    breath sounds clear to auscultation       Cardiovascular hypertension, Pt. on medications + Valvular Problems/Murmurs AI and AS  Rhythm:Regular Rate:Normal   '19 CTA chest - Stable uncomplicated mild fusiform ectasia of the ascending thoracic aorta measuring 39 mm in diameter, unchanged compared to the 08/2014 examination  '18 TTE - EF 60% to 65%. Grade 2 diastolic dysfunction. Mild AS, moderate AI. Mildly dilated RA.     Neuro/Psych  Headaches,  RLS Right foot drop Guillain Barre Syndrome (1970's)  negative psych ROS   GI/Hepatic Neg liver ROS, GERD  Medicated and Controlled,  Endo/Other  negative endocrine ROS  Renal/GU negative Renal ROS     Musculoskeletal  (+) Arthritis , Osteoarthritis,    Abdominal   Peds  Hematology negative hematology ROS (+)   Anesthesia Other Findings   Reproductive/Obstetrics                            Anesthesia Physical Anesthesia Plan  ASA: III  Anesthesia Plan: Spinal   Post-op Pain Management:  Regional for Post-op pain   Induction:   PONV Risk Score and Plan: 2 and Treatment may vary due to age or medical condition and Propofol infusion  Airway Management Planned: Natural Airway and Simple Face Mask  Additional Equipment: None  Intra-op Plan:   Post-operative Plan:   Informed Consent: I have reviewed the patients History and Physical, chart, labs and discussed the procedure including the risks, benefits and alternatives for the  proposed anesthesia with the patient or authorized representative who has indicated his/her understanding and acceptance.     Plan Discussed with: CRNA and Anesthesiologist  Anesthesia Plan Comments: (Labs reviewed, platelets acceptable. Discussed risks and benefits of spinal, including spinal/epidural hematoma, infection, failed block, and PDPH. Patient expressed understanding and wished to proceed. )      Anesthesia Quick Evaluation

## 2018-05-15 ENCOUNTER — Inpatient Hospital Stay (HOSPITAL_COMMUNITY): Payer: 59

## 2018-05-15 ENCOUNTER — Other Ambulatory Visit: Payer: Self-pay

## 2018-05-15 ENCOUNTER — Inpatient Hospital Stay (HOSPITAL_COMMUNITY)
Admission: RE | Admit: 2018-05-15 | Discharge: 2018-05-16 | DRG: 470 | Disposition: A | Discharge status: Home Health | Payer: 59 | Attending: Orthopedic Surgery | Admitting: Orthopedic Surgery

## 2018-05-15 ENCOUNTER — Inpatient Hospital Stay (HOSPITAL_COMMUNITY): Payer: 59 | Admitting: Anesthesiology

## 2018-05-15 ENCOUNTER — Encounter (HOSPITAL_COMMUNITY)
Admission: RE | Disposition: A | Discharge status: Home Health | Payer: Self-pay | Source: Home / Self Care | Attending: Orthopedic Surgery

## 2018-05-15 ENCOUNTER — Encounter (HOSPITAL_COMMUNITY): Payer: Self-pay | Admitting: *Deleted

## 2018-05-15 ENCOUNTER — Inpatient Hospital Stay: Payer: Self-pay

## 2018-05-15 DIAGNOSIS — Y792 Prosthetic and other implants, materials and accessory orthopedic devices associated with adverse incidents: Secondary | ICD-10-CM | POA: Diagnosis not present

## 2018-05-15 DIAGNOSIS — M109 Gout, unspecified: Secondary | ICD-10-CM | POA: Diagnosis present

## 2018-05-15 DIAGNOSIS — T84093A Other mechanical complication of internal left knee prosthesis, initial encounter: Secondary | ICD-10-CM

## 2018-05-15 DIAGNOSIS — Z806 Family history of leukemia: Secondary | ICD-10-CM

## 2018-05-15 DIAGNOSIS — T8454XA Infection and inflammatory reaction due to internal left knee prosthesis, initial encounter: Secondary | ICD-10-CM | POA: Diagnosis present

## 2018-05-15 DIAGNOSIS — Z888 Allergy status to other drugs, medicaments and biological substances status: Secondary | ICD-10-CM | POA: Diagnosis not present

## 2018-05-15 DIAGNOSIS — M12262 Villonodular synovitis (pigmented), left knee: Secondary | ICD-10-CM | POA: Diagnosis present

## 2018-05-15 DIAGNOSIS — Y831 Surgical operation with implant of artificial internal device as the cause of abnormal reaction of the patient, or of later complication, without mention of misadventure at the time of the procedure: Secondary | ICD-10-CM | POA: Diagnosis present

## 2018-05-15 DIAGNOSIS — M21371 Foot drop, right foot: Secondary | ICD-10-CM | POA: Diagnosis present

## 2018-05-15 DIAGNOSIS — T8484XA Pain due to internal orthopedic prosthetic devices, implants and grafts, initial encounter: Secondary | ICD-10-CM | POA: Diagnosis present

## 2018-05-15 DIAGNOSIS — K219 Gastro-esophageal reflux disease without esophagitis: Secondary | ICD-10-CM | POA: Diagnosis present

## 2018-05-15 DIAGNOSIS — I10 Essential (primary) hypertension: Secondary | ICD-10-CM | POA: Diagnosis present

## 2018-05-15 DIAGNOSIS — Z96652 Presence of left artificial knee joint: Secondary | ICD-10-CM | POA: Diagnosis not present

## 2018-05-15 DIAGNOSIS — Z7982 Long term (current) use of aspirin: Secondary | ICD-10-CM | POA: Diagnosis not present

## 2018-05-15 DIAGNOSIS — Z79899 Other long term (current) drug therapy: Secondary | ICD-10-CM

## 2018-05-15 DIAGNOSIS — I712 Thoracic aortic aneurysm, without rupture: Secondary | ICD-10-CM | POA: Diagnosis present

## 2018-05-15 DIAGNOSIS — Z887 Allergy status to serum and vaccine status: Secondary | ICD-10-CM | POA: Diagnosis not present

## 2018-05-15 DIAGNOSIS — Z8042 Family history of malignant neoplasm of prostate: Secondary | ICD-10-CM | POA: Diagnosis not present

## 2018-05-15 DIAGNOSIS — L910 Hypertrophic scar: Secondary | ICD-10-CM | POA: Diagnosis present

## 2018-05-15 DIAGNOSIS — E669 Obesity, unspecified: Secondary | ICD-10-CM | POA: Diagnosis present

## 2018-05-15 DIAGNOSIS — Z6826 Body mass index (BMI) 26.0-26.9, adult: Secondary | ICD-10-CM

## 2018-05-15 DIAGNOSIS — Z91048 Other nonmedicinal substance allergy status: Secondary | ICD-10-CM | POA: Diagnosis not present

## 2018-05-15 DIAGNOSIS — G2581 Restless legs syndrome: Secondary | ICD-10-CM | POA: Diagnosis present

## 2018-05-15 DIAGNOSIS — T849XXA Unspecified complication of internal orthopedic prosthetic device, implant and graft, initial encounter: Secondary | ICD-10-CM | POA: Diagnosis present

## 2018-05-15 HISTORY — PX: KNEE ARTHROSCOPY WITH MEDIAL MENISECTOMY: SHX5651

## 2018-05-15 HISTORY — PX: TOTAL KNEE ARTHROPLASTY WITH REVISION COMPONENTS: SHX6198

## 2018-05-15 LAB — TYPE AND SCREEN
ABO/RH(D): B POS
ANTIBODY SCREEN: NEGATIVE

## 2018-05-15 LAB — SYNOVIAL CELL COUNT + DIFF, W/ CRYSTALS
Crystals, Fluid: NONE SEEN
Lymphocytes-Synovial Fld: 6 % (ref 0–20)
Monocyte-Macrophage-Synovial Fluid: 94 % — ABNORMAL HIGH (ref 50–90)
WBC, SYNOVIAL: 265 /mm3 — AB (ref 0–200)

## 2018-05-15 SURGERY — TOTAL KNEE ARTHROPLASTY WITH REVISION COMPONENTS
Anesthesia: Spinal | Site: Knee | Laterality: Left

## 2018-05-15 MED ORDER — SODIUM CHLORIDE (PF) 0.9 % IJ SOLN
INTRAMUSCULAR | Status: DC | PRN
  Administered 2018-05-15: 30 mL

## 2018-05-15 MED ORDER — FENTANYL CITRATE (PF) 100 MCG/2ML IJ SOLN
INTRAMUSCULAR | Status: AC
  Filled 2018-05-15: qty 2

## 2018-05-15 MED ORDER — MIDAZOLAM HCL 2 MG/2ML IJ SOLN
INTRAMUSCULAR | Status: AC
  Filled 2018-05-15: qty 2

## 2018-05-15 MED ORDER — BUPIVACAINE-EPINEPHRINE (PF) 0.25% -1:200000 IJ SOLN
INTRAMUSCULAR | Status: AC
  Filled 2018-05-15: qty 30

## 2018-05-15 MED ORDER — PRAMIPEXOLE DIHYDROCHLORIDE 0.25 MG PO TABS
0.5000 mg | ORAL_TABLET | Freq: Every day | ORAL | Status: DC
  Administered 2018-05-15: 0.5 mg via ORAL
  Filled 2018-05-15: qty 2

## 2018-05-15 MED ORDER — 0.9 % SODIUM CHLORIDE (POUR BTL) OPTIME
TOPICAL | Status: DC | PRN
  Administered 2018-05-15: 1000 mL

## 2018-05-15 MED ORDER — METOCLOPRAMIDE HCL 5 MG PO TABS: 5.0000 mg | ORAL_TABLET | Freq: Three times a day (TID) | ORAL | Status: DC | PRN

## 2018-05-15 MED ORDER — ONDANSETRON HCL 4 MG/2ML IJ SOLN
INTRAMUSCULAR | Status: DC | PRN
  Administered 2018-05-15: 4 mg via INTRAVENOUS

## 2018-05-15 MED ORDER — BACLOFEN 10 MG PO TABS: 10.0000 mg | ORAL_TABLET | Freq: Three times a day (TID) | ORAL | 0 refills | Status: DC

## 2018-05-15 MED ORDER — BUPIVACAINE-EPINEPHRINE 0.25% -1:200000 IJ SOLN
INTRAMUSCULAR | Status: DC | PRN
  Administered 2018-05-15: 30 mL

## 2018-05-15 MED ORDER — TRANEXAMIC ACID-NACL 1000-0.7 MG/100ML-% IV SOLN
INTRAVENOUS | Status: DC | PRN
  Administered 2018-05-15: 1000 mg via INTRAVENOUS

## 2018-05-15 MED ORDER — OXYCODONE HCL 5 MG PO TABS
5.0000 mg | ORAL_TABLET | ORAL | Status: DC | PRN
  Administered 2018-05-15: 5 mg via ORAL
  Administered 2018-05-16: 10 mg via ORAL
  Administered 2018-05-16: 5 mg via ORAL
  Administered 2018-05-16: 10 mg via ORAL
  Administered 2018-05-16: 5 mg via ORAL
  Filled 2018-05-15 (×2): qty 1
  Filled 2018-05-15 (×2): qty 2
  Filled 2018-05-15: qty 1

## 2018-05-15 MED ORDER — ACETAMINOPHEN 325 MG PO TABS: 325.0000 mg | ORAL_TABLET | Freq: Four times a day (QID) | ORAL | Status: DC | PRN

## 2018-05-15 MED ORDER — ACETAMINOPHEN 500 MG PO TABS
1000.0000 mg | ORAL_TABLET | Freq: Four times a day (QID) | ORAL | Status: AC
  Administered 2018-05-15 – 2018-05-16 (×4): 1000 mg via ORAL
  Filled 2018-05-15 (×4): qty 2

## 2018-05-15 MED ORDER — ASPIRIN EC 325 MG PO TBEC: 325.0000 mg | DELAYED_RELEASE_TABLET | Freq: Two times a day (BID) | ORAL | 0 refills | Status: DC

## 2018-05-15 MED ORDER — ONDANSETRON HCL 4 MG/2ML IJ SOLN
4.0000 mg | Freq: Four times a day (QID) | INTRAMUSCULAR | Status: DC | PRN
  Administered 2018-05-15: 4 mg via INTRAVENOUS
  Filled 2018-05-15: qty 2

## 2018-05-15 MED ORDER — ONDANSETRON HCL 4 MG/2ML IJ SOLN
INTRAMUSCULAR | Status: AC
  Filled 2018-05-15: qty 2

## 2018-05-15 MED ORDER — TRANEXAMIC ACID-NACL 1000-0.7 MG/100ML-% IV SOLN
1000.0000 mg | Freq: Once | INTRAVENOUS | Status: AC
  Administered 2018-05-15: 1000 mg via INTRAVENOUS
  Filled 2018-05-15: qty 100

## 2018-05-15 MED ORDER — PHENOL 1.4 % MT LIQD: 1.0000 | OROMUCOSAL | Status: DC | PRN

## 2018-05-15 MED ORDER — OXYCODONE HCL 5 MG PO TABS: 5.0000 mg | ORAL_TABLET | Freq: Once | ORAL | Status: DC | PRN

## 2018-05-15 MED ORDER — ONDANSETRON HCL 4 MG/2ML IJ SOLN: 4.0000 mg | Freq: Once | INTRAMUSCULAR | Status: DC | PRN

## 2018-05-15 MED ORDER — HYDROMORPHONE HCL 1 MG/ML IJ SOLN
0.2500 mg | INTRAMUSCULAR | Status: DC | PRN
  Administered 2018-05-15 (×4): 0.5 mg via INTRAVENOUS

## 2018-05-15 MED ORDER — OXYCODONE HCL 5 MG/5ML PO SOLN
5.0000 mg | Freq: Once | ORAL | Status: DC | PRN
  Filled 2018-05-15: qty 5

## 2018-05-15 MED ORDER — SODIUM CHLORIDE 0.9 % IV SOLN
2.0000 g | Freq: Three times a day (TID) | INTRAVENOUS | Status: DC
  Administered 2018-05-15 – 2018-05-16 (×4): 2 g via INTRAVENOUS
  Filled 2018-05-15 (×6): qty 2

## 2018-05-15 MED ORDER — FENTANYL CITRATE (PF) 100 MCG/2ML IJ SOLN
INTRAMUSCULAR | Status: DC | PRN
  Administered 2018-05-15 (×2): 50 ug via INTRAVENOUS

## 2018-05-15 MED ORDER — POTASSIUM CHLORIDE IN NACL 20-0.45 MEQ/L-% IV SOLN
INTRAVENOUS | Status: DC
  Administered 2018-05-15 – 2018-05-16 (×2): via INTRAVENOUS
  Filled 2018-05-15 (×3): qty 1000

## 2018-05-15 MED ORDER — BISACODYL 10 MG RE SUPP: 10.0000 mg | Freq: Every day | RECTAL | Status: DC | PRN

## 2018-05-15 MED ORDER — SUMATRIPTAN SUCCINATE 25 MG PO TABS
25.0000 mg | ORAL_TABLET | ORAL | Status: DC | PRN
  Filled 2018-05-15: qty 1

## 2018-05-15 MED ORDER — ZOLPIDEM TARTRATE 5 MG PO TABS: 5.0000 mg | ORAL_TABLET | Freq: Every evening | ORAL | Status: DC | PRN

## 2018-05-15 MED ORDER — FENTANYL CITRATE (PF) 100 MCG/2ML IJ SOLN
25.0000 ug | INTRAMUSCULAR | Status: DC | PRN
  Administered 2018-05-15 (×3): 50 ug via INTRAVENOUS

## 2018-05-15 MED ORDER — PROPOFOL 10 MG/ML IV BOLUS
INTRAVENOUS | Status: AC
  Filled 2018-05-15: qty 20

## 2018-05-15 MED ORDER — OXYCODONE HCL 5 MG PO TABS
10.0000 mg | ORAL_TABLET | ORAL | Status: DC | PRN
  Administered 2018-05-15 (×2): 10 mg via ORAL
  Filled 2018-05-15 (×2): qty 2

## 2018-05-15 MED ORDER — MIDAZOLAM HCL 5 MG/5ML IJ SOLN
INTRAMUSCULAR | Status: DC | PRN
  Administered 2018-05-15 (×2): 2 mg via INTRAVENOUS

## 2018-05-15 MED ORDER — CALCIUM CARBONATE 1250 (500 CA) MG PO TABS
1.0000 | ORAL_TABLET | Freq: Every day | ORAL | Status: DC
  Administered 2018-05-16: 500 mg via ORAL
  Filled 2018-05-15: qty 1

## 2018-05-15 MED ORDER — ASPIRIN EC 325 MG PO TBEC
325.0000 mg | DELAYED_RELEASE_TABLET | Freq: Two times a day (BID) | ORAL | Status: DC
  Administered 2018-05-15 – 2018-05-16 (×2): 325 mg via ORAL
  Filled 2018-05-15 (×2): qty 1

## 2018-05-15 MED ORDER — ONDANSETRON HCL 4 MG PO TABS
4.0000 mg | ORAL_TABLET | Freq: Four times a day (QID) | ORAL | Status: DC | PRN
  Administered 2018-05-16 (×2): 4 mg via ORAL
  Filled 2018-05-15 (×2): qty 1

## 2018-05-15 MED ORDER — OXYCODONE HCL 5 MG PO TABS: 5.0000 mg | ORAL_TABLET | ORAL | 0 refills | Status: DC | PRN

## 2018-05-15 MED ORDER — PROPOFOL 10 MG/ML IV BOLUS
INTRAVENOUS | Status: AC
  Filled 2018-05-15: qty 40

## 2018-05-15 MED ORDER — LIDOCAINE 2% (20 MG/ML) 5 ML SYRINGE
INTRAMUSCULAR | Status: AC
  Filled 2018-05-15: qty 5

## 2018-05-15 MED ORDER — RIZATRIPTAN BENZOATE 10 MG PO TBDP: 10.0000 mg | ORAL_TABLET | ORAL | Status: DC | PRN

## 2018-05-15 MED ORDER — LORATADINE 10 MG PO TABS
10.0000 mg | ORAL_TABLET | Freq: Every day | ORAL | Status: DC
  Administered 2018-05-16: 10 mg via ORAL
  Filled 2018-05-15: qty 1

## 2018-05-15 MED ORDER — ADULT MULTIVITAMIN W/MINERALS CH
1.0000 | ORAL_TABLET | Freq: Every day | ORAL | Status: DC
  Administered 2018-05-16: 1 via ORAL
  Filled 2018-05-15: qty 1

## 2018-05-15 MED ORDER — ALUM & MAG HYDROXIDE-SIMETH 200-200-20 MG/5ML PO SUSP: 30.0000 mL | ORAL | Status: DC | PRN

## 2018-05-15 MED ORDER — CEFAZOLIN SODIUM-DEXTROSE 2-4 GM/100ML-% IV SOLN
2.0000 g | INTRAVENOUS | Status: AC
  Administered 2018-05-15: 2 g via INTRAVENOUS
  Filled 2018-05-15: qty 100

## 2018-05-15 MED ORDER — PANTOPRAZOLE SODIUM 40 MG PO TBEC
80.0000 mg | DELAYED_RELEASE_TABLET | Freq: Every day | ORAL | Status: DC
  Administered 2018-05-16: 80 mg via ORAL
  Filled 2018-05-15: qty 2

## 2018-05-15 MED ORDER — SENNA-DOCUSATE SODIUM 8.6-50 MG PO TABS: 2.0000 | ORAL_TABLET | Freq: Every day | ORAL | 1 refills | Status: DC

## 2018-05-15 MED ORDER — CHOLECALCIFEROL 10 MCG (400 UNIT) PO TABS
800.0000 [IU] | ORAL_TABLET | Freq: Every day | ORAL | Status: DC
  Administered 2018-05-16: 800 [IU] via ORAL
  Filled 2018-05-15: qty 2

## 2018-05-15 MED ORDER — ONDANSETRON HCL 4 MG PO TABS: 4.0000 mg | ORAL_TABLET | Freq: Three times a day (TID) | ORAL | 0 refills | Status: DC | PRN

## 2018-05-15 MED ORDER — HYDROMORPHONE HCL 1 MG/ML IJ SOLN
INTRAMUSCULAR | Status: AC
  Filled 2018-05-15: qty 1

## 2018-05-15 MED ORDER — PROPOFOL 500 MG/50ML IV EMUL
INTRAVENOUS | Status: DC | PRN
  Administered 2018-05-15: 80 ug/kg/min via INTRAVENOUS

## 2018-05-15 MED ORDER — MENTHOL 3 MG MT LOZG: 1.0000 | LOZENGE | OROMUCOSAL | Status: DC | PRN

## 2018-05-15 MED ORDER — SODIUM CHLORIDE 0.9% FLUSH: 10.0000 mL | INTRAVENOUS | Status: DC | PRN

## 2018-05-15 MED ORDER — ROPIVACAINE HCL 7.5 MG/ML IJ SOLN
INTRAMUSCULAR | Status: DC | PRN
  Administered 2018-05-15: 20 mL via PERINEURAL

## 2018-05-15 MED ORDER — METHOCARBAMOL 500 MG PO TABS
500.0000 mg | ORAL_TABLET | Freq: Four times a day (QID) | ORAL | Status: DC | PRN
  Administered 2018-05-15 – 2018-05-16 (×2): 500 mg via ORAL
  Filled 2018-05-15 (×2): qty 1

## 2018-05-15 MED ORDER — BUPIVACAINE IN DEXTROSE 0.75-8.25 % IT SOLN
INTRATHECAL | Status: DC | PRN
  Administered 2018-05-15: 2 mL via INTRATHECAL

## 2018-05-15 MED ORDER — DOCUSATE SODIUM 100 MG PO CAPS
100.0000 mg | ORAL_CAPSULE | Freq: Two times a day (BID) | ORAL | Status: DC
  Administered 2018-05-15 – 2018-05-16 (×2): 100 mg via ORAL
  Filled 2018-05-15 (×2): qty 1

## 2018-05-15 MED ORDER — CEFAZOLIN SODIUM-DEXTROSE 2-4 GM/100ML-% IV SOLN
2.0000 g | Freq: Four times a day (QID) | INTRAVENOUS | Status: DC
  Administered 2018-05-15: 2 g via INTRAVENOUS
  Filled 2018-05-15: qty 100

## 2018-05-15 MED ORDER — SODIUM CHLORIDE 0.9 % IR SOLN
Status: DC | PRN
  Administered 2018-05-15: 4000 mL

## 2018-05-15 MED ORDER — CALCIUM CARB-CHOLECALCIFEROL 600-800 MG-UNIT PO TABS: ORAL_TABLET | Freq: Every day | ORAL | Status: DC

## 2018-05-15 MED ORDER — ALLOPURINOL 300 MG PO TABS
150.0000 mg | ORAL_TABLET | Freq: Every evening | ORAL | Status: DC
  Administered 2018-05-15 – 2018-05-16 (×2): 150 mg via ORAL
  Filled 2018-05-15 (×2): qty 1

## 2018-05-15 MED ORDER — PROPOFOL 10 MG/ML IV BOLUS
INTRAVENOUS | Status: AC
  Filled 2018-05-15: qty 60

## 2018-05-15 MED ORDER — GLYCOPYRROLATE 0.2 MG/ML IJ SOLN
INTRAMUSCULAR | Status: DC | PRN
  Administered 2018-05-15: 0.2 mg via INTRAVENOUS

## 2018-05-15 MED ORDER — WATER FOR IRRIGATION, STERILE IR SOLN
Status: DC | PRN
  Administered 2018-05-15: 2000 mL

## 2018-05-15 MED ORDER — METHOCARBAMOL 500 MG IVPB - SIMPLE MED
INTRAVENOUS | Status: AC
  Filled 2018-05-15: qty 50

## 2018-05-15 MED ORDER — GLYCOPYRROLATE PF 0.2 MG/ML IJ SOSY
PREFILLED_SYRINGE | INTRAMUSCULAR | Status: AC
  Filled 2018-05-15: qty 1

## 2018-05-15 MED ORDER — SODIUM CHLORIDE (PF) 0.9 % IJ SOLN
INTRAMUSCULAR | Status: AC
  Filled 2018-05-15: qty 50

## 2018-05-15 MED ORDER — CHLORHEXIDINE GLUCONATE 4 % EX LIQD: 60.0000 mL | Freq: Once | CUTANEOUS | Status: DC

## 2018-05-15 MED ORDER — METHOCARBAMOL 500 MG IVPB - SIMPLE MED
500.0000 mg | Freq: Four times a day (QID) | INTRAVENOUS | Status: DC | PRN
  Administered 2018-05-15: 500 mg via INTRAVENOUS
  Filled 2018-05-15: qty 50

## 2018-05-15 MED ORDER — HYDROMORPHONE HCL 1 MG/ML IJ SOLN: 0.5000 mg | INTRAMUSCULAR | Status: DC | PRN

## 2018-05-15 MED ORDER — VALACYCLOVIR HCL 500 MG PO TABS
1000.0000 mg | ORAL_TABLET | Freq: Every day | ORAL | Status: DC | PRN
  Filled 2018-05-15: qty 2

## 2018-05-15 MED ORDER — MAGNESIUM CITRATE PO SOLN: 1.0000 | Freq: Once | ORAL | Status: DC | PRN

## 2018-05-15 MED ORDER — LACTATED RINGERS IV SOLN
INTRAVENOUS | Status: DC
  Administered 2018-05-15 (×3): via INTRAVENOUS

## 2018-05-15 MED ORDER — DILTIAZEM HCL ER COATED BEADS 240 MG PO CP24
240.0000 mg | ORAL_CAPSULE | Freq: Every evening | ORAL | Status: DC
  Administered 2018-05-16: 240 mg via ORAL
  Filled 2018-05-15 (×2): qty 1

## 2018-05-15 MED ORDER — DEXAMETHASONE SODIUM PHOSPHATE 10 MG/ML IJ SOLN
10.0000 mg | Freq: Once | INTRAMUSCULAR | Status: AC
  Administered 2018-05-16: 10 mg via INTRAVENOUS
  Filled 2018-05-15: qty 1

## 2018-05-15 MED ORDER — POLYETHYLENE GLYCOL 3350 17 G PO PACK
17.0000 g | PACK | Freq: Every day | ORAL | Status: DC | PRN
  Administered 2018-05-16: 17 g via ORAL
  Filled 2018-05-15: qty 1

## 2018-05-15 MED ORDER — DIPHENHYDRAMINE HCL 12.5 MG/5ML PO ELIX: 12.5000 mg | ORAL_SOLUTION | ORAL | Status: DC | PRN

## 2018-05-15 MED ORDER — BUPIVACAINE LIPOSOME 1.3 % IJ SUSP
20.0000 mL | Freq: Once | INTRAMUSCULAR | Status: AC
  Administered 2018-05-15: 20 mL
  Filled 2018-05-15: qty 20

## 2018-05-15 MED ORDER — SODIUM CHLORIDE 0.9% FLUSH
10.0000 mL | INTRAVENOUS | Status: DC | PRN
  Administered 2018-05-16: 10 mL
  Administered 2018-05-16: 40 mL
  Filled 2018-05-15 (×2): qty 40

## 2018-05-15 MED ORDER — METOCLOPRAMIDE HCL 5 MG/ML IJ SOLN: 5.0000 mg | Freq: Three times a day (TID) | INTRAMUSCULAR | Status: DC | PRN

## 2018-05-15 MED ORDER — EPHEDRINE SULFATE 50 MG/ML IJ SOLN
INTRAMUSCULAR | Status: DC | PRN
  Administered 2018-05-15: 10 mg via INTRAVENOUS

## 2018-05-15 MED ORDER — EPHEDRINE 5 MG/ML INJ
INTRAVENOUS | Status: AC
  Filled 2018-05-15: qty 10

## 2018-05-15 SURGICAL SUPPLY — 80 items
BAG ZIPLOCK 12X15 (MISCELLANEOUS) IMPLANT
BANDAGE ACE 6X5 VEL STRL LF (GAUZE/BANDAGES/DRESSINGS) ×2 IMPLANT
BANDAGE ESMARK 6X9 LF (GAUZE/BANDAGES/DRESSINGS) IMPLANT
BEARING TIBIAL PS 10X71/75 (Joint) ×2 IMPLANT
BIT DRILL QUICK REL 1/8 2PK SL (DRILL) ×1 IMPLANT
BLADE 10 SAFETY STRL DISP (BLADE) ×2 IMPLANT
BLADE CLIPPER SURG (BLADE) IMPLANT
BLADE GREAT WHITE 4.2 (BLADE) IMPLANT
BLADE SAG 18X100X1.27 (BLADE) ×4 IMPLANT
BLADE SURG 15 STRL LF DISP TIS (BLADE) IMPLANT
BLADE SURG 15 STRL SS (BLADE)
BLADE SURG SZ11 CARB STEEL (BLADE) IMPLANT
BNDG ELASTIC 6X10 VLCR STRL LF (GAUZE/BANDAGES/DRESSINGS) ×2 IMPLANT
BNDG ESMARK 6X9 LF (GAUZE/BANDAGES/DRESSINGS)
BONE CEMENT GENTAMICIN (Cement) ×4 IMPLANT
BOOTIES KNEE HIGH SLOAN (MISCELLANEOUS) IMPLANT
BOWL SMART MIX CTS (DISPOSABLE) ×2 IMPLANT
CEMENT BONE GENTAMICIN 40 (Cement) ×2 IMPLANT
CLSR STERI-STRIP ANTIMIC 1/2X4 (GAUZE/BANDAGES/DRESSINGS) ×4 IMPLANT
COMP FEMORAL VANGUARD 75 LT (Joint) ×2 IMPLANT
COMPONENT FEMRL VANGUARD 75LT (Joint) ×1 IMPLANT
CONT SPEC 4OZ CLIKSEAL STRL BL (MISCELLANEOUS) ×4 IMPLANT
COVER SURGICAL LIGHT HANDLE (MISCELLANEOUS) ×2 IMPLANT
COVER WAND RF STERILE (DRAPES) ×2 IMPLANT
CUFF TOURN SGL QUICK 34 (TOURNIQUET CUFF) ×1
CUFF TRNQT CYL 34X4X40X1 (TOURNIQUET CUFF) ×1 IMPLANT
DECANTER SPIKE VIAL GLASS SM (MISCELLANEOUS) ×2 IMPLANT
DISTAL FEMORAL PEG (Knees) ×2 IMPLANT
DRAPE U-SHAPE 47X51 STRL (DRAPES) ×2 IMPLANT
DRILL QUICK RELEASE 1/8 INCH (DRILL) ×1
DRSG MEPILEX BORDER 4X8 (GAUZE/BANDAGES/DRESSINGS) ×2 IMPLANT
DRSG PAD ABDOMINAL 8X10 ST (GAUZE/BANDAGES/DRESSINGS) ×2 IMPLANT
DURAPREP 26ML APPLICATOR (WOUND CARE) ×4 IMPLANT
ELECT REM PT RETURN 15FT ADLT (MISCELLANEOUS) ×2 IMPLANT
GAUZE SPONGE 4X4 12PLY STRL (GAUZE/BANDAGES/DRESSINGS) IMPLANT
GLOVE BIO SURGEON STRL SZ7.5 (GLOVE) ×2 IMPLANT
GLOVE BIO SURGEON STRL SZ8 (GLOVE) ×2 IMPLANT
GLOVE BIOGEL PI IND STRL 8 (GLOVE) ×2 IMPLANT
GLOVE BIOGEL PI INDICATOR 8 (GLOVE) ×2
GLOVE ORTHO TXT STRL SZ7.5 (GLOVE) ×4 IMPLANT
GOWN STRL REUS W/TWL 2XL LVL3 (GOWN DISPOSABLE) IMPLANT
GOWN STRL REUS W/TWL LRG LVL3 (GOWN DISPOSABLE) ×4 IMPLANT
HANDPIECE INTERPULSE COAX TIP (DISPOSABLE) ×1
HOLDER FOLEY CATH W/STRAP (MISCELLANEOUS) ×2 IMPLANT
HOOD PEEL AWAY FLYTE STAYCOOL (MISCELLANEOUS) ×8 IMPLANT
IMMOBILIZER KNEE 20 (SOFTGOODS) ×2
IMMOBILIZER KNEE 20 THIGH 36 (SOFTGOODS) ×1 IMPLANT
KIT BASIN OR (CUSTOM PROCEDURE TRAY) IMPLANT
MANIFOLD NEPTUNE II (INSTRUMENTS) ×2 IMPLANT
NDL SAFETY ECLIPSE 18X1.5 (NEEDLE) IMPLANT
NEEDLE HYPO 18GX1.5 SHARP (NEEDLE)
NEEDLE HYPO 22GX1.5 SAFETY (NEEDLE) ×2 IMPLANT
NEEDLE SPNL 18GX3.5 QUINCKE PK (NEEDLE) IMPLANT
NS IRRIG 1000ML POUR BTL (IV SOLUTION) ×2 IMPLANT
PACK ARTHROSCOPY WL (CUSTOM PROCEDURE TRAY) ×2 IMPLANT
PACK ICE MAXI GEL EZY WRAP (MISCELLANEOUS) ×2 IMPLANT
PACK TOTAL KNEE CUSTOM (KITS) ×2 IMPLANT
PADDING CAST COTTON 6X4 STRL (CAST SUPPLIES) IMPLANT
PATELLA STAND VANGUARD 40X10 (Joint) ×2 IMPLANT
PATELLA STD VANGUARD 40X10 (Joint) ×1 IMPLANT
PEG FEMORAL DISTAL (Knees) ×1 IMPLANT
PLATE KNEE TIBIAL 75MM FIXED (Plate) ×2 IMPLANT
PROTECTOR NERVE ULNAR (MISCELLANEOUS) ×2 IMPLANT
SET HNDPC FAN SPRY TIP SCT (DISPOSABLE) ×1 IMPLANT
SPONGE LAP 4X18 RFD (DISPOSABLE) IMPLANT
SUT ETHILON 4 0 PS 2 18 (SUTURE) IMPLANT
SUT VIC AB 0 CT1 36 (SUTURE) ×4 IMPLANT
SUT VIC AB 2-0 CT1 27 (SUTURE) ×1
SUT VIC AB 2-0 CT1 TAPERPNT 27 (SUTURE) ×1 IMPLANT
SUT VIC AB 3-0 SH 8-18 (SUTURE) ×2 IMPLANT
SWAB COLLECTION DEVICE MRSA (MISCELLANEOUS) IMPLANT
SWAB CULTURE ESWAB REG 1ML (MISCELLANEOUS) IMPLANT
SYR 20CC LL (SYRINGE) IMPLANT
SYR CONTROL 10ML LL (SYRINGE) IMPLANT
TOWEL OR 17X26 10 PK STRL BLUE (TOWEL DISPOSABLE) ×2 IMPLANT
TRAY FOLEY MTR SLVR 16FR STAT (SET/KITS/TRAYS/PACK) ×2 IMPLANT
TUBING ARTHRO INFLOW-ONLY STRL (TUBING) IMPLANT
WAND HAND CNTRL MULTIVAC 90 (MISCELLANEOUS) IMPLANT
WATER STERILE IRR 1000ML POUR (IV SOLUTION) ×4 IMPLANT
WRAP KNEE MAXI GEL POST OP (GAUZE/BANDAGES/DRESSINGS) ×2 IMPLANT

## 2018-05-15 NOTE — Progress Notes (Signed)
Pharmacy Antibiotic Note  Marvin Munoz is a 57 y.o. male with hx L knee replacement in 2017 and c/o of pain in the left knee, presented to [facility] on 05/15/2018 for revision of left knee replacement. Dr. Luanna Cole note on 11/26 -- "the cell count from the fluid aspirate looked normal, with only 265 white blood cells. 1 of the cultures/Gram stain demonstrated gram-negative rods".  To start cefepime for suspected knee infection with plan to consult ID for further recommendations.   Plan: - cefepime 2gm IV q8h - f/u cultures and recom. from ID _________________________________  Height: 6\' 3"  (190.5 cm) Weight: 215 lb (97.5 kg) IBW/kg (Calculated) : 84.5  Temp (24hrs), Avg:97.5 F (36.4 C), Min:97.2 F (36.2 C), Max:97.7 F (36.5 C)  Recent Labs  Lab 05/09/18 1411  WBC 5.4  CREATININE 1.10    Estimated Creatinine Clearance: 87.5 mL/min (by C-G formula based on SCr of 1.1 mg/dL).    Allergies  Allergen Reactions  . Colchicine Diarrhea  . Influenza Vaccines Other (See Comments)    Guillain-barre syndrome  . Adhesive [Tape] Rash     Thank you for allowing pharmacy to be a part of this patient's care.  Lynelle Doctor 05/15/2018 4:10 PM

## 2018-05-15 NOTE — Discharge Instructions (Signed)

## 2018-05-15 NOTE — OR Nursing (Addendum)
[id number]. 2019 0926, Mr. Sourish Allender had a left total knee procedure by Dr. Havery Moros. The explanted implants were washed and given to [employer], [employer] Rep per the [employer] for the implants are under warranty and company request them to be sent back. The following implants are: Oxford med femur: [id number], Oxford size E tibia: [id number], Oxford bearing size med 4: [id number]. These are given to [facility]. Delma Freeze.

## 2018-05-15 NOTE — Progress Notes (Signed)
Preliminary results reviewed, the cell count from the fluid aspirate looked normal, with only 265 white blood cells.  1 of the cultures/Gram stain demonstrated gram-negative rods.  I am unclear if this is a contaminant, or real, however given the overall questionable clinical scenario, as well as the intraoperative findings of brownish synovium, and blackened cartilage on the patella and lateral femoral condyle, I am going to order a PICC line, infectious disease consultation, and treat relatively aggressively as if this were a one stage exchange arthroplasty.  I look forward to the infectious disease recommendations regarding the specific antibiotics of choice, we may arrange for home IV antibiotics, the patient wants to go home tomorrow, I am not sure if we can get all of this in place by then, but we will try.  Switch to cefipime, discussed with Dr. Tommy Medal.   Johnny Bridge, MD

## 2018-05-15 NOTE — Op Note (Signed)
DATE OF SURGERY:  05/15/2018 TIME: 1:47 PM  PATIENT NAME:  Marvin Munoz   AGE: 57 y.o.    PRE-OPERATIVE DIAGNOSIS: Painful left partial knee arthroplasty  POST-OPERATIVE DIAGNOSIS:  Same  PROCEDURE: Revision, left partial knee replacement with conversion to total knee replacement, including both tibial and femoral components  SURGEON:  Johnny Bridge, MD   ASSISTANT:  Joya Gaskins, OPA-C, present and scrubbed throughout the case, critical for assistance with exposure, retraction, instrumentation, and closure.   OPERATIVE IMPLANTS: Biomet Vanguard Fixed Bearing Posterior Stabilized Femur size 75, Tibia size 75, Patella size 40 3-peg oval button, with a 10 mm polyethylene insert.  I used antibiotic impregnated cement given the revision setting.   PREOPERATIVE INDICATIONS:  Marvin Munoz is a 57 y.o. year old male who had a partial knee replacement approximately 2 years ago.  He presented with about 1 year worth of significant symptoms with recurrent effusions, limitations in ambulatory function, persistent episodic pain that was moderate to severe.  Preoperative work-up demonstrated synovitis on a bone scan, there was negative aspirations for crystals, although he does have a history of gout, and negative work-up for infection.  He had about 135 degrees of motion during examination in the office.  He elected for revision operation because of persistent pain.  Risks benefits and alternatives were discussed including but not limited the risks of infection, bleeding, nerve injury, anterolateral numbness, stiffness, DVT, PE, morbidity, mortality, incomplete relief of pain, worsening of motion, among others, and he was willing to proceed.   OPERATIVE FINDINGS AND UNIQUE ASPECTS OF THE CASE: He had substantial hypertrophic scar during the approach, and despite excellent motion had very thickened tissues.  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  The partial knee components all appeared stable, and the polyethylene tracked well.  The undersurface of the patella and the lateral compartment had a strange blackish appearance to the cartilage itself, and the patellar cartilage appeared to be softening, even as if it were in the stages of delamination, almost like an avascular necrosis type appearance.  Was very unusual.  ESTIMATED BLOOD LOSS: 200 mL  OPERATIVE DESCRIPTION:  The patient was brought to the operative room and placed in a supine position.  Spinal anesthesia was administered.  IV antibiotics were given.  The lower extremity was prepped and draped in the usual sterile fashion.  Time out was performed.  The leg was elevated and exsanguinated and the tourniquet was inflated.  I used an 18-gauge needle to aspirate the joint and got about 30 mL of slightly cloudy joint fluid, did not appear infected, but was more joint fluid than would be expected.  Anterior quadriceps tendon splitting approach was performed.  The patella was everted and subluxated.  The anterior horn of the lateral meniscus was removed.  I performed a medial release in order to gain access to the joint.  I performed a sharp synovectomy, particularly of all of the brownish synovial tissue.  This was lining the notch, the ACL, and the gutters.  The anterior aspect where we clear the bone to prevent impingement had completely scarred in with hypertrophic scar.  The distal femur was opened with the drill and the intramedullary distal femoral cutting jig was utilized, set at 5 degrees resecting 10 mm off the distal femur.  Care was taken to protect the collateral ligaments.  I left the medial femoral component in place in order to help serve as  a reference.  After completing the lateral cut, I removed the bone that I could with the medial cut, and then used an osteotome to remove the femoral component, there was almost no bone loss.  I then exposed the  tibia, released to the lateral meniscus from the anterior horn, remove the ACL and PCL, and placed retractors posteriorly and medially and laterally.  Then the extramedullary tibial cutting jig was utilized making the appropriate cut using the anterior tibial crest as a reference building in appropriate posterior slope.  Before making this cut, I remove the tibial implant with an osteotome, again I did not lose very much if any bone.  The baseplate did feel stable.    I then used the extra medullary jig to cut the lateral compartment with a skim cut of the medial compartment.  Care was taken during the cut to protect the medial and collateral ligaments.  The proximal tibia was removed along with the posterior horns of the menisci.  The PCL was sacrificed.    The extensor gap was measured and was approximately 67mm.    The distal femoral sizing jig was applied, taking care to avoid notching.  This was fairly challenging to get accurate, because of the loss of the posterior medial femoral condyle as a reference, so I initially was slightly overly externally rotated, which I adjusted, based on [place] line, and matching the appropriate amount of posterior medial bone intended to be taken which was really just a very thin sliver.    Then the 4-in-1 cutting jig was applied and the anterior and posterior femur was cut, along with the chamfer cuts.  All posterior osteophytes were removed.  The flexion gap was then measured and was symmetric with the extension gap.  I completed the distal femoral preparation using the appropriate jig to prepare the box.  The patella was then measured, and cut with the saw.  The thickness before the cut was 26 and after the cut was 18.  The proximal tibia sized and prepared accordingly with the reamer and the punch, and then all components were trialed with the 93mm poly insert.  The knee was found to have excellent balance and full motion.    The above named components  were then cemented into place and all excess cement was removed.  The real polyethylene implant was placed.  After the cement had cured I released the tourniquet and confirmed excellent hemostasis with no major posterior vessel injury.    The knee was easily taken through a range of motion and the patella tracked well and the knee irrigated copiously and the parapatellar and subcutaneous tissue closed with vicryl, and monocryl with steri strips for the skin.  The wounds were injected with marcaine, and exparel, and dressed with sterile gauze and the patient was awakened and returned to the PACU in stable and satisfactory condition.  There were no complications.  Total tourniquet time was a little bit over 120 minutes.  The tourniquet was released at 112 minutes, and 20 minutes were waited while he reperfused during the final preparation and cleaning.  During this time I irrigated a total of 4 L of fluid across all of the synovium, as well as the joint.

## 2018-05-15 NOTE — Progress Notes (Signed)
Peripherally Inserted Central Catheter/Midline Placement  The IV Nurse has discussed with the patient and/or persons authorized to consent for the patient, the purpose of this procedure and the potential benefits and risks involved with this procedure.  The benefits include less needle sticks, lab draws from the catheter, and the patient may be discharged home with the catheter. Risks include, but not limited to, infection, bleeding, blood clot (thrombus formation), and puncture of an artery; nerve damage and irregular heartbeat and possibility to perform a PICC exchange if needed/ordered by physician.  Alternatives to this procedure were also discussed.  Bard Power PICC patient education guide, fact sheet on infection prevention and patient information card has been provided to patient /or left at bedside.    PICC/Midline Placement Documentation  PICC Single Lumen 05/15/18 PICC Right Cephalic 46 cm 0 cm (Active)  Indication for Insertion or Continuance of Line Home intravenous therapies (PICC only) 05/15/2018 10:26 PM  Exposed Catheter (cm) 0 cm 05/15/2018 10:26 PM  Site Assessment Clean;Dry;Intact 05/15/2018 10:26 PM  Line Status Blood return noted;Flushed;Saline locked 05/15/2018 10:26 PM  Dressing Type Transparent;Securing device 05/15/2018 10:26 PM  Dressing Status Clean;Dry;Intact;Antimicrobial disc in place 05/15/2018 10:26 PM  Line Adjustment (NICU/IV Team Only) No 05/15/2018 10:26 PM  Dressing Intervention New dressing 05/15/2018 10:26 PM  Dressing Change Due 05/22/18 05/15/2018 10:26 PM       Aldona Lento L 05/15/2018, 10:46 PM

## 2018-05-15 NOTE — H&P (Signed)
PREOPERATIVE H&P  Chief Complaint: Left painful knee arthroplasty  HPI: Marvin Munoz is a 57 y.o. male who presents for preoperative history and physical with a diagnosis of painful left compartmental knee replacement. Symptoms are rated as moderate to severe, and have been worsening.  This is significantly impairing activities of daily living.  He has elected for surgical management.  He had 6 months of difficult postoperative recovery, followed by 1 year of good results, and then a year after that he has had progressive decline in function with more significant pain.  He has had multiple aspirations, that did not demonstrate evidence for infection.  He has maintained good motion.   Past Medical History:  Diagnosis Date  . Allergic rhinitis   . Eczema   . Fever blister    occasional  . Foot drop, right 09/24/2014  . Gastroesophageal reflux   . Gout    05-09-2018 per pt last flare up, 2014  . Guillain Barr syndrome ([place]) 1970's   per pt received swine flu vaccination and regular flu vaccination at same time, was told one or the other may have caused guillain barre  . HTN (hypertension)   . Migraine headache   . Mild aortic valve stenosis cardiologist-  dr Daneen Schick   per last echo  02-01-2017   moderately thickended and calcificed leaflets with mild AS and mild AR,  valve area 2.43cm^2  . 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 Nocturia   . Nocturnal leg cramps   . OA (osteoarthritis)    back,  left knee  . PONV (postoperative nausea and vomiting)   . PVC's (premature ventricular contractions)    hx of benign  . Restless legs syndrome (RLS)   . Thoracic ascending aortic aneurysm [facility])    followed by dr h. Tamala Julian--- per last CTA 02-21-2018 , 21mm   Past Surgical History:  Procedure Laterality Date  . BREAST LUMPECTOMY Right 1990s   "benign"  . CARDIAC  CATHETERIZATION  09/10/2015   dr Bonnita Nasuti [place]  @[facility]    normal coronaries and LVF,  false positive cardiolite  . COLONOSCOPY    . REPLACEMENT UNICONDYLAR JOINT KNEE Left 09/10/2015    dr Mardelle Matte  @[facility]   . TENDON RECONSTRUCTION Right 06/02/2016   Procedure: right elbow extensor tendon repair and debridement and repair lateral collateral ligament;  Surgeon: Ninetta Lights, MD;  Location: [place];  Service: Orthopedics;  Laterality: Right;  . TONSILLECTOMY  child  . TRANSTHORACIC ECHOCARDIOGRAM  02/01/2017   mild LVH,  ef 68-03%, Grade 2 diastolic dysfucntion/  moderately thickened moderately calcified AV leafleat with mild stenosis and moderate regurg. (valve area 2.43cm^2)/  mild to moderate calcified MV annulus with triv. regurg/ trivial TR/  m;ild RAE   Social History   Socioeconomic History  . Marital status: Married    Spouse name: Not on file  . Number of children: 2  . Years of education: college  . Highest education level: Not on file  Occupational History  . Occupation: Librarian, academic, Control and instrumentation engineer  Social Needs  . Financial resource strain: Not on file  . Food insecurity:    Worry: Not on file    Inability: Not on file  . Transportation needs:    Medical: Not on file    Non-medical: Not on file  Tobacco Use  . Smoking status: Never Smoker  . Smokeless tobacco: Never  Used  Substance and Sexual Activity  . Alcohol use: Not Currently    Alcohol/week: 0.0 standard drinks  . Drug use: Never  . Sexual activity: Yes  Lifestyle  . Physical activity:    Days per week: Not on file    Minutes per session: Not on file  . Stress: Not on file  Relationships  . Social connections:    Talks on phone: Not on file    Gets together: Not on file    Attends religious service: Not on file    Active member of club or organization: Not on file    Attends meetings of clubs or organizations: Not on file    Relationship status: Not on file  Other Topics Concern  .  Not on file  Social History Narrative   Patient is right handed.   Patient does not drink caffeine.   Family History  Problem Relation Age of Onset  . Heart attack Father   . Prostate cancer Father   . COPD Mother        smoked  . Asthma Mother   . Arthritis/Rheumatoid Mother   . Leukemia Maternal Grandfather   . Prostate cancer Maternal Grandfather   . Prostate cancer Maternal Uncle    Allergies  Allergen Reactions  . Colchicine Diarrhea  . Influenza Vaccines Other (See Comments)    Guillain-barre syndrome  . Adhesive [Tape] Rash   Prior to Admission medications   Medication Sig Start Date End Date Taking? Authorizing Provider  allopurinol (ZYLOPRIM) 300 MG tablet Take 150 mg by mouth every evening.  10/25/13  Yes [provider]  aspirin 81 MG tablet Take 81 mg by mouth daily.   Yes [provider]  Calcium Carb-Cholecalciferol (CALCIUM + D3 PO) Take 1 tablet by mouth daily.    Yes [provider]  cetirizine (ZYRTEC) 10 MG tablet Take 10 mg by mouth every morning.    Yes [provider]  diltiazem (CARDIZEM CD) 240 MG 24 hr capsule Take 240 mg by mouth every evening.  11/01/13  Yes [provider]  FIBER PO Take 1 capsule by mouth daily.   Yes [provider]  Multiple Vitamin (MULTIVITAMIN) tablet Take 1 tablet by mouth daily.   Yes [provider]  omeprazole (PRILOSEC) 40 MG capsule Take 40 mg by mouth every morning.  10/25/13  Yes [provider]  pramipexole (MIRAPEX) 0.5 MG tablet Take 0.5 mg by mouth at bedtime.    Yes [provider]  rizatriptan (MAXALT-MLT) 10 MG disintegrating tablet Take 10 mg by mouth as needed for migraine. May repeat in 2 hours if needed   Yes [provider]  valACYclovir (VALTREX) 1000 MG tablet Take 1,000 mg by mouth daily as needed (for cold sores).  10/25/13  Yes [provider]     Positive ROS: All other systems have been reviewed and were  otherwise negative with the exception of those mentioned in the HPI and as above.  Physical Exam: General: Alert, no acute distress Cardiovascular: No pedal edema Respiratory: No cyanosis, no use of accessory musculature GI: No organomegaly, abdomen is soft and non-tender Skin: No lesions in the area of chief complaint Neurologic: Sensation intact distally Psychiatric: Patient is competent for consent with normal mood and affect Lymphatic: No axillary or cervical lymphadenopathy  MUSCULOSKELETAL: Left knee has well-healed surgical wounds, minimal effusion, 0 to 125 degrees of motion, intact stability.  Assessment: Painful left unicompartmental knee replacement   Plan: Plan for  Procedure(s): Exploration, left partial knee replacement, probable conversion to total knee replacement.  The risks benefits and alternatives were discussed with the patient including but not limited to the risks of nonoperative treatment, versus surgical intervention including infection, bleeding, nerve injury,  blood clots, cardiopulmonary complications, morbidity, mortality, among others, and they were willing to proceed.   Anticipated LOS equal to or greater than 2 midnights due to - Age 37 and older with one or more of the following:  - Obesity  - Expected need for hospital services (PT, OT, Nursing) required for safe  discharge  - Anticipated need for postoperative skilled nursing care or inpatient rehab  - comorbidities, as above    Johnny Bridge, MD Cell [phone]   05/15/2018 7:14 AM

## 2018-05-15 NOTE — Anesthesia Procedure Notes (Addendum)
Anesthesia Regional Block: Adductor canal block   Pre-Anesthetic Checklist: ,, timeout performed, Correct Patient, Correct Site, Correct Laterality, Correct Procedure, Correct Position, site marked, Risks and benefits discussed,  Surgical consent,  Pre-op evaluation,  At surgeon's request and post-op pain management  Laterality: Left  Prep: chloraprep       Needles:  Injection technique: Single-shot  Needle Type: Echogenic Needle     Needle Length: 10cm  Needle Gauge: 21     Additional Needles:   Narrative:  Start time: 05/15/2018 7:19 AM End time: 05/15/2018 7:22 AM Injection made incrementally with aspirations every 5 mL.  Performed by: Personally  Anesthesiologist: Audry Pili, MD  Additional Notes: No pain on injection. No increased resistance to injection. Injection made in 5cc increments. Good needle visualization. Patient tolerated the procedure well.

## 2018-05-15 NOTE — Anesthesia Procedure Notes (Signed)
Spinal  Patient location during procedure: OR Start time: 05/15/2018 7:57 AM End time: 05/15/2018 8:01 AM Staffing Anesthesiologist: Audry Pili, MD Performed: anesthesiologist  Preanesthetic Checklist Completed: patient identified, surgical consent, pre-op evaluation, timeout performed, IV checked, risks and benefits discussed and monitors and equipment checked Spinal Block Patient position: sitting Prep: DuraPrep Patient monitoring: heart rate, cardiac monitor, continuous pulse ox and blood pressure Approach: midline Location: L3-4 Injection technique: single-shot Needle Needle type: Pencan  Needle gauge: 24 G Additional Notes Consent was obtained prior to the procedure with all questions answered and concerns addressed. Risks including, but not limited to, bleeding, infection, nerve damage, paralysis, failed block, inadequate analgesia, allergic reaction, high spinal, itching, and headache were discussed and the patient wished to proceed. Functioning IV was confirmed and monitors were applied. Sterile prep and drape, including hand hygiene, mask, and sterile gloves were used. The patient was positioned and the spine was prepped. The skin was anesthetized with lidocaine. Free flow of clear CSF was obtained prior to injecting local anesthetic into the CSF. The spinal needle aspirated freely following injection. The needle was carefully withdrawn. The patient tolerated the procedure well.   Renold Don, MD

## 2018-05-15 NOTE — Anesthesia Postprocedure Evaluation (Signed)
Anesthesia Post Note  Patient: Marvin Munoz  Procedure(s) Performed: conversion to LEFT total KNEE ARTHROPLASTY with hardware removal (Left Knee) LEFT KNEE ARTHROSCOPY WITH MEDIAL MENISECTOMY (Left Knee)     Patient location during evaluation: PACU Anesthesia Type: Spinal Level of consciousness: oriented and awake and alert Pain management: pain level controlled Vital Signs Assessment: post-procedure vital signs reviewed and stable Respiratory status: spontaneous breathing, respiratory function stable and patient connected to nasal cannula oxygen Cardiovascular status: blood pressure returned to baseline and stable Postop Assessment: no headache, no backache and no apparent nausea or vomiting Anesthetic complications: no    Last Vitals:  Vitals:   05/15/18 1412 05/15/18 1501  BP: 129/79 130/81  Pulse: 61 (!) 58  Resp: 16 16  Temp: (!) 36.2 C (!) 36.3 C  SpO2: 100% 100%    Last Pain:  Vitals:   05/15/18 1501  TempSrc: Oral  PainSc:                  [address]

## 2018-05-15 NOTE — Transfer of Care (Signed)
Immediate Anesthesia Transfer of Care Note  Patient: Marvin Munoz  Procedure(s) Performed: conversion to LEFT total KNEE ARTHROPLASTY with hardware removal (Left Knee) LEFT KNEE ARTHROSCOPY WITH MEDIAL MENISECTOMY (Left Knee)  Patient Location: PACU  Anesthesia Type:Spinal  Level of Consciousness: awake, alert  and oriented  Airway & Oxygen Therapy: Patient Spontanous Breathing and Patient connected to nasal cannula oxygen  Post-op Assessment: Report given to RN and Post -op Vital signs reviewed and stable  Post vital signs: Reviewed and stable  Last Vitals:  Vitals Value Taken Time  BP 107/70 05/15/2018 11:33 AM  Temp    Pulse 68 05/15/2018 11:35 AM  Resp 17 05/15/2018 11:35 AM  SpO2 98 % 05/15/2018 11:35 AM  Vitals shown include unvalidated device data.  Last Pain:  Vitals:   05/15/18 0628  TempSrc:   PainSc: 6       Patients Stated Pain Goal: 5 (53/00/51 1021)  Complications: No apparent anesthesia complications

## 2018-05-15 NOTE — Evaluation (Signed)
Physical Therapy Evaluation Patient Details Name: Marvin Munoz MRN: [id number] DOB: [birth year] Today's Date: 05/15/2018   History of Present Illness  Pt is a L TKR on 05/15/18. PMH includes GERD, gout, HTN, Guillain Barre syndrome in the '70s with R foot drop and neuropathy of R peroneal nerve, aortic valve stenosis, OA, PVCs, RLS, thoracic AA, cardiac catheterization, L UKR 2017.  Clinical Impression   Pt presents with L knee pain, decreased L knee ROM, difficulty performing mobility tasks secondary to pain and nausea, and decreased tolerance for ambulation. Pt to benefit from acute PT to address deficits. Pt with nausea at start of session that was exacerbated by sitting; nausea passed with continued sitting and did not return for ambulation. Pt ambulated 65 ft with min guard assist with RW. Pt educated on quad sets (5-10/hour), ankle pumps (20/hour), and heel slides (5-10/hour) to perform this afternoon/evening to lessen stiffness and increase circulation, to pt's tolerance and limited by pain. PT to progress mobility as tolerated, and will continue to follow acutely.   BP upon sitting EOB: 130/94     Follow Up Recommendations Follow surgeon's recommendation for DC plan and follow-up therapies;Supervision for mobility/OOB(HHPT)    Equipment Recommendations  None recommended by PT    Recommendations for Other Services       Precautions / Restrictions Precautions Precautions: Fall Restrictions Weight Bearing Restrictions: No Other Position/Activity Restrictions: WBAT       Mobility  Bed Mobility Overal bed mobility: Needs Assistance Bed Mobility: Supine to Sit     Supine to sit: Min assist;HOB elevated     General bed mobility comments: Pt with baseline mild nausea upon arrival to room. Min assist for LLE management. VC for scooting to EOB. Pt with increased nausea upon sitting EOB, pt supplied with emesis bag. PT wafted alcohol swab under pt's nose, decreased pt's nausea.  Pt sat EOB for total of 5 minutes recovering from nausea and L knee pain.   Transfers Overall transfer level: Needs assistance Equipment used: Rolling walker (2 wheeled) Transfers: Sit to/from Stand Sit to Stand: +2 safety/equipment;From elevated surface;Min assist         General transfer comment: Min assist for power up and steadying. Pt shifted weight R and L, no LLE buckling noted with WB.   Ambulation/Gait Ambulation/Gait assistance: Min guard;+2 safety/equipment Gait Distance (Feet): 65 Feet Assistive device: Rolling walker (2 wheeled) Gait Pattern/deviations: Step-to pattern;Decreased stride length;Decreased weight shift to left;Trunk flexed Gait velocity: decr    General Gait Details: Min guard for safety. Initially close guard to monitor for LLE buckling, none present throughout ambulation. Verbal cuing for placement in RW, sequencing.   Stairs            Wheelchair Mobility    Modified Rankin (Stroke Patients Only)       Balance Overall balance assessment: Mild deficits observed, not formally tested                                           Pertinent Vitals/Pain Pain Assessment: 0-10 Pain Score: 7  Pain Location: L knee  Pain Descriptors / Indicators: Sore Pain Intervention(s): Limited activity within patient's tolerance;Repositioned;Ice applied;Monitored during session    [place] expects to be discharged to:: Private residence Living Arrangements: Spouse/significant other Available Help at Discharge: Family;Available PRN/intermittently Type of Home: House Home Access: Stairs to enter Entrance Stairs-Rails: None Entrance  Stairs-Number of Steps: 4 Home Layout: Two level;Able to live on main level with bedroom/bathroom Home Equipment: Cane - single point;Crutches;Walker - 2 wheels      Prior Function Level of Independence: Independent               Hand Dominance   Dominant Hand: Right     Extremity/Trunk Assessment   Upper Extremity Assessment Upper Extremity Assessment: Overall WFL for tasks assessed    Lower Extremity Assessment Lower Extremity Assessment: Overall WFL for tasks assessed;LLE deficits/detail LLE Deficits / Details: suspected post-surgical weakness; able to perform quad set, ankle pumps, SLR  LLE Sensation: WNL    Cervical / Trunk Assessment Cervical / Trunk Assessment: Normal  Communication   Communication: No difficulties  Cognition Arousal/Alertness: Awake/alert Behavior During Therapy: WFL for tasks assessed/performed Overall Cognitive Status: Within Functional Limits for tasks assessed                                        General Comments      Exercises Total Joint Exercises Ankle Circles/Pumps: AROM;Both;5 reps;Seated Quad Sets: AROM;Left;5 reps;Seated Goniometric ROM: knee AAROM ~10-65*, limited by stiffness and pain    Assessment/Plan    PT Assessment Patient needs continued PT services  PT Problem List Decreased strength;Pain;Decreased range of motion;Decreased activity tolerance;Decreased knowledge of use of DME;Decreased balance;Decreased safety awareness;Decreased mobility       PT Treatment Interventions DME instruction;Therapeutic activities;Gait training;Therapeutic exercise;Patient/family education;Stair training;Balance training;Functional mobility training    PT Goals (Current goals can be found in the Care Plan section)  Acute Rehab PT Goals PT Goal Formulation: With patient Time For Goal Achievement: 05/22/18 Potential to Achieve Goals: Good    Frequency 7X/week   Barriers to discharge        Co-evaluation               AM-PAC PT "6 Clicks" Mobility  Outcome Measure Help needed turning from your back to your side while in a flat bed without using bedrails?: A Little Help needed moving from lying on your back to sitting on the side of a flat bed without using bedrails?: A Little Help  needed moving to and from a bed to a chair (including a wheelchair)?: A Little Help needed standing up from a chair using your arms (e.g., wheelchair or bedside chair)?: A Little Help needed to walk in hospital room?: A Little Help needed climbing 3-5 steps with a railing? : A Little 6 Click Score: 18    End of Session Equipment Utilized During Treatment: Gait belt Activity Tolerance: Patient tolerated treatment well Patient left: in chair;with chair alarm set;with call bell/phone within reach;with family/visitor present;with SCD's reapplied Nurse Communication: Mobility status PT Visit Diagnosis: Other abnormalities of gait and mobility (R26.89);Difficulty in walking, not elsewhere classified (R26.2)    Time: 1635-1710 PT Time Calculation (min) (ACUTE ONLY): 35 min   Charges:   PT Evaluation $PT Eval Low Complexity: 1 Low PT Treatments $Gait Training: 8-22 mins        Julien Girt, PT Acute Rehabilitation Services Pager [phone]  Office [phone]   Nashia Remus D Elonda Husky 05/15/2018, 5:51 PM

## 2018-05-16 DIAGNOSIS — T8454XA Infection and inflammatory reaction due to internal left knee prosthesis, initial encounter: Principal | ICD-10-CM

## 2018-05-16 DIAGNOSIS — E669 Obesity, unspecified: Secondary | ICD-10-CM

## 2018-05-16 DIAGNOSIS — Z96652 Presence of left artificial knee joint: Secondary | ICD-10-CM

## 2018-05-16 DIAGNOSIS — Z888 Allergy status to other drugs, medicaments and biological substances status: Secondary | ICD-10-CM

## 2018-05-16 DIAGNOSIS — Z887 Allergy status to serum and vaccine status: Secondary | ICD-10-CM

## 2018-05-16 DIAGNOSIS — I10 Essential (primary) hypertension: Secondary | ICD-10-CM

## 2018-05-16 DIAGNOSIS — Z91048 Other nonmedicinal substance allergy status: Secondary | ICD-10-CM

## 2018-05-16 LAB — BASIC METABOLIC PANEL
ANION GAP: 5 (ref 5–15)
BUN: 10 mg/dL (ref 6–20)
CHLORIDE: 105 mmol/L (ref 98–111)
CO2: 29 mmol/L (ref 22–32)
Calcium: 8.7 mg/dL — ABNORMAL LOW (ref 8.9–10.3)
Creatinine, Ser: 0.97 mg/dL (ref 0.61–1.24)
GFR calc Af Amer: >60 mL/min (ref >60)
Glucose, Bld: 119 mg/dL — ABNORMAL HIGH (ref 70–99)
POTASSIUM: 4.1 mmol/L (ref 3.5–5.1)
Sodium: 139 mmol/L (ref 135–145)

## 2018-05-16 LAB — CBC
HEMATOCRIT: 34.4 % — AB (ref 39.0–52.0)
HEMOGLOBIN: 11.4 g/dL — AB (ref 13.0–17.0)
MCH: 32.3 pg (ref 26.0–34.0)
MCHC: 33.1 g/dL (ref 30.0–36.0)
MCV: 97.5 fL (ref 80.0–100.0)
NRBC: 0 % (ref 0.0–0.2)
PLATELETS: 148 10*3/uL — AB (ref 150–400)
RBC: 3.53 MIL/uL — AB (ref 4.22–5.81)
RDW: 12.3 % (ref 11.5–15.5)
WBC: 6.9 10*3/uL (ref 4.0–10.5)

## 2018-05-16 LAB — SEDIMENTATION RATE: Sed Rate: 5 mm/hr (ref 0–16)

## 2018-05-16 LAB — C-REACTIVE PROTEIN: CRP: 2.7 mg/dL — ABNORMAL HIGH (ref <1.0)

## 2018-05-16 MED ORDER — CEFEPIME IV (FOR PTA / DISCHARGE USE ONLY): 2.0000 g | Freq: Three times a day (TID) | INTRAVENOUS | 0 refills | Status: AC

## 2018-05-16 MED ORDER — HEPARIN SOD (PORK) LOCK FLUSH 100 UNIT/ML IV SOLN
250.0000 [IU] | INTRAVENOUS | Status: AC | PRN
  Administered 2018-05-16: 250 [IU]

## 2018-05-16 MED ORDER — HEPARIN SOD (PORK) LOCK FLUSH 100 UNIT/ML IV SOLN: 250.0000 [IU] | INTRAVENOUS | Status: DC | PRN

## 2018-05-16 NOTE — Progress Notes (Signed)
PHARMACY CONSULT NOTE FOR:  OUTPATIENT  PARENTERAL ANTIBIOTIC THERAPY (OPAT)  Indication: cefepime 2gm q8h Indication:  Infection of partal knee replacement Last Day of Therapy:  06/27/2018  IV antibiotic discharge orders are pended. To discharging provider:  please sign these orders via discharge navigator,  Select New Orders & click on the button choice - Manage This Unsigned Work.     Thank you for allowing pharmacy to be a part of this patient's care.  Doreene Eland, PharmD, BCPS.   Work Cell: [phone] 05/16/2018 2:09 PM

## 2018-05-16 NOTE — Progress Notes (Signed)
PHARMACY NOTE -  Cefepime  Pharmacy has been assisting with dosing of Cefepime for possible prosthetic joint infxn.  Dosage remains stable at 2g IV q8 hr and need for further dosage adjustment appears unlikely at present given stable renal function  Ortho awaiting input from ID regarding need for continued abx  Pharmacy will sign off, following peripherally for culture results or dose adjustments. Please reconsult if a change in clinical status warrants re-evaluation of dosage.  Reuel Boom, PharmD, BCPS [phone] 05/16/2018, 1:47 PM

## 2018-05-16 NOTE — Addendum Note (Signed)
Addendum  created 05/16/18 0636 by Lollie Sails, CRNA   Charge Capture section accepted

## 2018-05-16 NOTE — Care Management Note (Signed)
Case Management Note  Patient Details  Name: NAINOA WOLDT MRN: [id number] Date of Birth: 1960-12-28  Subjective/Objective:  [facility] no longer able to accept referral for [facility]. [facility] will accept for both nursing and PT                  Action/Plan:   Expected Discharge Date:                  Expected Discharge Plan:  [place]  In-House Referral:  NA  Discharge planning Services  CM Consult  Post Acute Care Choice:  Home Health, NA Choice offered to:  Patient  DME Arranged:  N/A DME Agency:  NA  HH Arranged:  PT, RN, Disease Management [place] Agency:  [place]  Status of Service:  Completed, signed off  If discussed at [place] of Stay Meetings, dates discussed:    Additional Comments:  Guadalupe Maple, RN 05/16/2018, 1:38 PM

## 2018-05-16 NOTE — Progress Notes (Signed)
Physical Therapy Treatment Patient Details Name: ADETOKUNBO MCCADDEN MRN: [id number] DOB: [birth year] Today's Date: 05/16/2018    History of Present Illness Pt is s/p L UKA to  TKA revision on 05/15/18. PMH includes GERD, gout, HTN, Guillain Barre syndrome in the '70s with R foot drop and neuropathy of R peroneal nerve, aortic valve stenosis, OA, PVCs, RLS, thoracic AA, cardiac catheterization, L UKR 2017.    PT Comments    Noted pt now has DC order, Pt was under impression he would DC home tomorrow, RN to clarify. Stair training  completed in preparation for possible DC today. Pt is mobilizing well, no nausea this session. He is ready to DC home from PT standpoint.    Follow Up Recommendations  Follow surgeon's recommendation for DC plan and follow-up therapies;Supervision for mobility/OOB(HHPT)     Equipment Recommendations  None recommended by PT    Recommendations for Other Services       Precautions / Restrictions Precautions Precautions: Fall;Knee Precaution Booklet Issued: Yes (comment) Precaution Comments: reviewed no pillow under knee Restrictions Weight Bearing Restrictions: No Other Position/Activity Restrictions: WBAT     Mobility  Bed Mobility Overal bed mobility: Needs Assistance Bed Mobility: Supine to Sit;Sit to Supine     Supine to sit: HOB elevated;Modified independent (Device/Increase time)     General bed mobility comments: up in recliner  Transfers Overall transfer level: Modified independent Equipment used: Rolling walker (2 wheeled) Transfers: Sit to/from Stand Sit to Stand: Modified independent (Device/Increase time)         General transfer comment: VCs hand placement  Ambulation/Gait Ambulation/Gait assistance: Modified independent (Device/Increase time) Gait Distance (Feet): 120 Feet Assistive device: Rolling walker (2 wheeled) Gait Pattern/deviations: Step-to pattern Gait velocity: decr    General Gait Details: steady, no loss of  balance   Stairs Stairs: Yes Stairs assistance: Min assist Stair Management: No rails;Backwards;Step to pattern;With walker Number of Stairs: 3 General stair comments: min A to steady RW, VCs sequencing   Wheelchair Mobility    Modified Rankin (Stroke Patients Only)       Balance Overall balance assessment: Modified Independent                                          Cognition Arousal/Alertness: Awake/alert Behavior During Therapy: WFL for tasks assessed/performed Overall Cognitive Status: Within Functional Limits for tasks assessed                                        Exercises Total Joint Exercises Ankle Circles/Pumps: AROM;Both;10 reps;Supine Quad Sets: AROM;Left;10 reps;Supine Heel Slides: AAROM;Left;10 reps;Supine Straight Leg Raises: AROM;Left;10 reps;Supine Knee Flexion: AAROM;AROM;Left;10 reps;Seated    General Comments        Pertinent Vitals/Pain Pain Score: 7  Pain Location: L knee  Pain Descriptors / Indicators: Sore Pain Intervention(s): Limited activity within patient's tolerance;Monitored during session;Ice applied;RN gave pain meds during session    Home Living                      Prior Function            PT Goals (current goals can now be found in the care plan section) Acute Rehab PT Goals Patient Stated Goal: play golf PT Goal Formulation: With patient Time For Goal Achievement:  05/22/18 Potential to Achieve Goals: Good Progress towards PT goals: Goals met/education completed, patient discharged from PT    Frequency    7X/week      PT Plan Current plan remains appropriate    Co-evaluation              AM-PAC PT "6 Clicks" Mobility   Outcome Measure  Help needed turning from your back to your side while in a flat bed without using bedrails?: None Help needed moving from lying on your back to sitting on the side of a flat bed without using bedrails?: None Help needed  moving to and from a bed to a chair (including a wheelchair)?: None Help needed standing up from a chair using your arms (e.g., wheelchair or bedside chair)?: None Help needed to walk in hospital room?: None Help needed climbing 3-5 steps with a railing? : A Little 6 Click Score: 23    End of Session Equipment Utilized During Treatment: Gait belt Activity Tolerance: Patient tolerated treatment well(nausea) Patient left: with call bell/phone within reach;in chair;with nursing/sitter in room Nurse Communication: Mobility status PT Visit Diagnosis: Other abnormalities of gait and mobility (R26.89);Difficulty in walking, not elsewhere classified (R26.2)     Time: 2277-3750 PT Time Calculation (min) (ACUTE ONLY): 27 min  Charges:  $Gait Training: 8-22 mins  $Therapeutic Activity: 8-22 mins                     Blondell Reveal Kistler PT 05/16/2018  Acute Rehabilitation Services Pager [phone] Office [phone]

## 2018-05-16 NOTE — Progress Notes (Signed)
Physical Therapy Treatment Patient Details Name: Marvin Munoz MRN: [id number] DOB: [birth year] Today's Date: 05/16/2018    History of Present Illness Pt is s/p L UKA to  TKA revision on 05/15/18. PMH includes GERD, gout, HTN, Guillain Barre syndrome in the '70s with R foot drop and neuropathy of R peroneal nerve, aortic valve stenosis, OA, PVCs, RLS, thoracic AA, cardiac catheterization, L UKR 2017.    PT Comments    Instructed pt in TKA HEP, AAROM L knee ~ 5-80*. He is independent with SLR. With supine to sit pt became nauseous, this didn't resolve after sitting for several minutes so returned to supine. RN gave medication for nausea. Will attempt ambulation later today. Good progress expected once nausea resolves.    Follow Up Recommendations  Follow surgeon's recommendation for DC plan and follow-up therapies;Supervision for mobility/OOB(HHPT)     Equipment Recommendations  None recommended by PT    Recommendations for Other Services       Precautions / Restrictions Precautions Precautions: Fall;Knee Precaution Booklet Issued: Yes (comment) Precaution Comments: reviewed no pillow under knee Restrictions Weight Bearing Restrictions: No Other Position/Activity Restrictions: WBAT     Mobility  Bed Mobility Overal bed mobility: Needs Assistance Bed Mobility: Supine to Sit;Sit to Supine     Supine to sit: HOB elevated;Modified independent (Device/Increase time) Sit to supine: Min assist   General bed mobility comments: Mod I supine to sit with rail and HOB up, min A for LLE into bed supine to sit  Transfers                 General transfer comment: deferred 2* nausea  Ambulation/Gait                 Stairs             Wheelchair Mobility    Modified Rankin (Stroke Patients Only)       Balance                                            Cognition Arousal/Alertness: Awake/alert Behavior During Therapy: WFL for tasks  assessed/performed Overall Cognitive Status: Within Functional Limits for tasks assessed                                        Exercises Total Joint Exercises Ankle Circles/Pumps: AROM;Both;10 reps;Supine Quad Sets: AROM;Left;10 reps;Supine Short Arc Quad: AROM;Left;10 reps;Supine Heel Slides: AAROM;Left;10 reps;Supine Hip ABduction/ADduction: AROM;Left;Supine;10 reps Straight Leg Raises: AROM;Left;10 reps;Supine Knee Flexion: AAROM;AROM;Left;10 reps;Seated Goniometric ROM: 5-80* AAROM L knee    General Comments        Pertinent Vitals/Pain Pain Score: 6  Pain Location: L knee  Pain Descriptors / Indicators: Sore Pain Intervention(s): Limited activity within patient's tolerance;Monitored during session;Premedicated before session;Ice applied    Home Living                      Prior Function            PT Goals (current goals can now be found in the care plan section) Acute Rehab PT Goals PT Goal Formulation: With patient Time For Goal Achievement: 05/22/18 Potential to Achieve Goals: Good Progress towards PT goals: Progressing toward goals    Frequency    7X/week  PT Plan Current plan remains appropriate    Co-evaluation              AM-PAC PT "6 Clicks" Mobility   Outcome Measure  Help needed turning from your back to your side while in a flat bed without using bedrails?: A Little Help needed moving from lying on your back to sitting on the side of a flat bed without using bedrails?: A Little Help needed moving to and from a bed to a chair (including a wheelchair)?: A Little Help needed standing up from a chair using your arms (e.g., wheelchair or bedside chair)?: A Little Help needed to walk in hospital room?: A Little Help needed climbing 3-5 steps with a railing? : A Little 6 Click Score: 18    End of Session Equipment Utilized During Treatment: Gait belt Activity Tolerance: Treatment limited secondary to medical  complications (Comment)(nausea) Patient left: with call bell/phone within reach;in bed Nurse Communication: Mobility status;Other (comment)(pt nauseous) PT Visit Diagnosis: Other abnormalities of gait and mobility (R26.89);Difficulty in walking, not elsewhere classified (R26.2)     Time: 6812-7517 PT Time Calculation (min) (ACUTE ONLY): 32 min  Charges:  $Therapeutic Exercise: 8-22 mins $Therapeutic Activity: 8-22 mins                     Blondell Reveal Kistler PT 05/16/2018  Acute Rehabilitation Services Pager [phone] Office [phone]

## 2018-05-16 NOTE — Progress Notes (Signed)
Physical Therapy Treatment Patient Details Name: Marvin Munoz MRN: [id number] DOB: [birth year] Today's Date: 05/16/2018    History of Present Illness Pt is s/p L UKA to  TKA revision on 05/15/18, PICC line placed 2* infection found in L knee. PMH includes GERD, gout, HTN, Guillain Barre syndrome in the '70s with R foot drop and neuropathy of R peroneal nerve, aortic valve stenosis, OA, PVCs, RLS, thoracic AA, cardiac catheterization, L UKR 2017.    PT Comments    Pt progressing well with mobility, he reports nausea has resolved. He ambulated 120' with RW and performed TKA exercises. Will plan to do stair training tomorrow morning.   Follow Up Recommendations  Follow surgeon's recommendation for DC plan and follow-up therapies;Supervision for mobility/OOB(HHPT)     Equipment Recommendations  None recommended by PT    Recommendations for Other Services       Precautions / Restrictions Precautions Precautions: Fall;Knee Precaution Booklet Issued: Yes (comment) Precaution Comments: reviewed no pillow under knee Restrictions Weight Bearing Restrictions: No Other Position/Activity Restrictions: WBAT     Mobility  Bed Mobility Overal bed mobility: Needs Assistance Bed Mobility: Supine to Sit;Sit to Supine     Supine to sit: HOB elevated;Modified independent (Device/Increase time)        Transfers Overall transfer level: Needs assistance Equipment used: Rolling walker (2 wheeled) Transfers: Sit to/from Stand Sit to Stand: Supervision         General transfer comment: VCs hand placement  Ambulation/Gait Ambulation/Gait assistance: Supervision Gait Distance (Feet): 120 Feet Assistive device: Rolling walker (2 wheeled) Gait Pattern/deviations: Step-to pattern Gait velocity: decr    General Gait Details: VCs sequencing with RW, no loss of balance, distance limited by L knee pain, no buckling   Stairs             Wheelchair Mobility    Modified Rankin  (Stroke Patients Only)       Balance Overall balance assessment: Modified Independent                                          Cognition Arousal/Alertness: Awake/alert Behavior During Therapy: WFL for tasks assessed/performed Overall Cognitive Status: Within Functional Limits for tasks assessed                                        Exercises Total Joint Exercises Ankle Circles/Pumps: AROM;Both;10 reps;Supine Quad Sets: AROM;Left;10 reps;Supine Heel Slides: AAROM;Left;10 reps;Supine Straight Leg Raises: AROM;Left;10 reps;Supine    General Comments        Pertinent Vitals/Pain Pain Score: 7  Pain Location: L knee  Pain Descriptors / Indicators: Sore Pain Intervention(s): Limited activity within patient's tolerance;Monitored during session;Premedicated before session;Ice applied    Home Living                      Prior Function            PT Goals (current goals can now be found in the care plan section) Acute Rehab PT Goals PT Goal Formulation: With patient Time For Goal Achievement: 05/22/18 Potential to Achieve Goals: Good Progress towards PT goals: Progressing toward goals    Frequency    7X/week      PT Plan Current plan remains appropriate    Co-evaluation  AM-PAC PT "6 Clicks" Mobility   Outcome Measure  Help needed turning from your back to your side while in a flat bed without using bedrails?: A Little Help needed moving from lying on your back to sitting on the side of a flat bed without using bedrails?: A Little Help needed moving to and from a bed to a chair (including a wheelchair)?: A Little Help needed standing up from a chair using your arms (e.g., wheelchair or bedside chair)?: A Little Help needed to walk in hospital room?: A Little Help needed climbing 3-5 steps with a railing? : A Little 6 Click Score: 18    End of Session Equipment Utilized During Treatment: Gait  belt Activity Tolerance: Patient tolerated treatment well(nausea) Patient left: with call bell/phone within reach;in chair;with chair alarm set Nurse Communication: Mobility status;Other (comment) PT Visit Diagnosis: Other abnormalities of gait and mobility (R26.89);Difficulty in walking, not elsewhere classified (R26.2)     Time: 2924-4628 PT Time Calculation (min) (ACUTE ONLY): 29 min  Charges:  $Gait Training: 8-22 mins $Therapeutic Exercise: 8-22 mins                    Blondell Reveal Kistler PT 05/16/2018  Acute Rehabilitation Services Pager [phone] Office [phone]

## 2018-05-16 NOTE — Discharge Summary (Signed)
Physician Discharge Summary  Patient ID: Marvin Munoz MRN: [id number] DOB/AGE: 1960/07/29 57 y.o.  Admit date: 05/15/2018 Discharge date: 05/16/2018  Admission Diagnoses:  Failed total knee, left [facility])  Discharge Diagnoses:  Principal Problem:   Failed partial knee, left ([facility]) possible septic left knee unicompartmental arthroplasty  Past Medical History:  Diagnosis Date  . Allergic rhinitis   . Eczema   . Fever blister    occasional  . Foot drop, right 09/24/2014  . Gastroesophageal reflux   . Gout    05-09-2018 per pt last flare up, 2014  . Guillain Barr syndrome ([place]) 1970's   per pt received swine flu vaccination and regular flu vaccination at same time, was told one or the other may have caused guillain barre  . HTN (hypertension)   . Migraine headache   . Mild aortic valve stenosis cardiologist-  dr Daneen Schick   per last echo  02-01-2017   moderately thickended and calcificed leaflets with mild AS and mild AR,  valve area 2.43cm^2  . 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 Nocturia   . Nocturnal leg cramps   . OA (osteoarthritis)    back,  left knee  . PONV (postoperative nausea and vomiting)   . PVC's (premature ventricular contractions)    hx of benign  . Restless legs syndrome (RLS)   . Thoracic ascending aortic aneurysm [facility])    followed by dr h. Tamala Julian--- per last CTA 02-21-2018 , 2m    Surgeries: Procedure(s): conversion to LEFT total KNEE ARTHROPLASTY with hardware removal on 05/15/2018   Consultants (if any): VTommy Medal Discharged Condition: Improved  Hospital Course: Marvin HEBERLEis an 57y.o. male who was admitted 05/15/2018 with a diagnosis of Failed [place] went to [place] operating room on 05/15/2018 and underwent the above named procedures.  After the procedure was completed, 1 of the Gram stain results came back  with gram-negative rods.  A PICC line was placed, infectious disease consultation performed, given the abnormal appearance of his cartilage intraoperatively, combined with this result, I am concerned that he may have had a low-grade infection.  For this reason, we will treat him effectively like a single stage exchange arthroplasty, with close follow-up with me.  Culture results are pending at this time.  He was given perioperative antibiotics:  Anti-infectives (From admission, onward)   Start     Dose/Rate Route Frequency Ordered Stop   05/17/18 0000  ceFEPime (MAXIPIME) IVPB     2 g Intravenous Every 8 hours 05/16/18 1434 06/27/18 2359   05/15/18 1630  ceFEPIme (MAXIPIME) 2 g in sodium chloride 0.9 % 100 mL IVPB     2 g 200 mL/hr over 30 Minutes Intravenous Every 8 hours 05/15/18 1618     05/15/18 1430  ceFAZolin (ANCEF) IVPB 2g/100 mL premix  Status:  Discontinued     2 g 200 mL/hr over 30 Minutes Intravenous Every 6 hours 05/15/18 1422 05/15/18 1603   05/15/18 1422  valACYclovir (VALTREX) tablet 1,000 mg     1,000 mg Oral Daily PRN 05/15/18 1422     05/15/18 0600  ceFAZolin (ANCEF) IVPB 2g/100 mL premix     2 g 200 mL/hr over 30 Minutes Intravenous On call to O.R. 05/15/18 0627011/26/19 0755    .  He was given sequential compression devices, early ambulation, and aspirin for DVT  prophylaxis.    He benefited maximally from the hospital stay and there were no complications.    Recent vital signs:  Vitals:   05/16/18 0602 05/16/18 0918  BP: 112/72 107/75  Pulse: 84 87  Resp: 16 16  Temp: 98.1 F (36.7 C) 99 F (37.2 C)  SpO2: 100% 94%    Recent laboratory studies:  Lab Results  Component Value Date   HGB 11.4 (L) 05/16/2018   HGB 14.1 05/09/2018   Lab Results  Component Value Date   WBC 6.9 05/16/2018   PLT 148 (L) 05/16/2018   No results found for: INR Lab Results  Component Value Date   NA 139 05/16/2018   K 4.1 05/16/2018   CL 105 05/16/2018   CO2 29  05/16/2018   BUN 10 05/16/2018   CREATININE 0.97 05/16/2018   GLUCOSE 119 (H) 05/16/2018    Discharge Medications:   Allergies as of 05/16/2018      Reactions   Colchicine Diarrhea   Influenza Vaccines Other (See Comments)   Guillain-barre syndrome   Adhesive [tape] Rash      Medication List    STOP taking these medications   aspirin 81 MG tablet Replaced by:  aspirin EC 325 MG tablet     TAKE these medications   allopurinol 300 MG tablet Commonly known as:  ZYLOPRIM Take 150 mg by mouth every evening.   aspirin EC 325 MG tablet Take 1 tablet (325 mg total) by mouth 2 (two) times daily. Replaces:  aspirin 81 MG tablet   baclofen 10 MG tablet Commonly known as:  LIORESAL Take 1 tablet (10 mg total) by mouth 3 (three) times daily. As needed for muscle spasm   CALCIUM + D3 PO Take 1 tablet by mouth daily.   ceFEPime  IVPB Commonly known as:  MAXIPIME Inject 2 g into the vein every 8 (eight) hours. Indication:  Infection of partal knee replacement Last Day of Therapy:  06/27/2018 Labs - Once weekly:  CBC/D and BMP, Labs - Every other week:  ESR and CRP Start taking on:  05/17/2018   cetirizine 10 MG tablet Commonly known as:  ZYRTEC Take 10 mg by mouth every morning.   diltiazem 240 MG 24 hr capsule Commonly known as:  CARDIZEM CD Take 240 mg by mouth every evening.   FIBER PO Take 1 capsule by mouth daily.   multivitamin tablet Take 1 tablet by mouth daily.   omeprazole 40 MG capsule Commonly known as:  PRILOSEC Take 40 mg by mouth every morning.   ondansetron 4 MG tablet Commonly known as:  ZOFRAN Take 1 tablet (4 mg total) by mouth every 8 (eight) hours as needed for nausea or vomiting.   oxyCODONE 5 MG immediate release tablet Commonly known as:  Oxy IR/ROXICODONE Take 1 tablet (5 mg total) by mouth every 4 (four) hours as needed for severe pain.   pramipexole 0.5 MG tablet Commonly known as:  MIRAPEX Take 0.5 mg by mouth at bedtime.    rizatriptan 10 MG disintegrating tablet Commonly known as:  MAXALT-MLT Take 10 mg by mouth as needed for migraine. May repeat in 2 hours if needed   sennosides-docusate sodium 8.6-50 MG tablet Commonly known as:  SENOKOT-S Take 2 tablets by mouth daily.   valACYclovir 1000 MG tablet Commonly known as:  VALTREX Take 1,000 mg by mouth daily as needed (for cold sores).            Home Infusion Instuctions  (From admission,  onward)         Start     Ordered   05/16/18 0000  Home infusion instructions [facility] May follow [place] Dosing Protocol; May administer Cathflo as needed to maintain patency of vascular access device.; Flushing of vascular access device: per [facility] Protocol: 0.9% NaCl pre/post medica...    Question Answer Comment  Instructions May follow [place] Dosing Protocol   Instructions May administer Cathflo as needed to maintain patency of vascular access device.   Instructions Flushing of vascular access device: per [facility] Protocol: 0.9% NaCl pre/post medication administration and prn patency; Heparin 100 u/ml, 47m for implanted ports and Heparin 10u/ml, 56mfor all other central venous catheters.   Instructions May follow [facility] Anaphylaxis Protocol for First Dose Administration in the home: 0.9% NaCl at 25-50 ml/hr to maintain IV access for protocol meds. Epinephrine 0.3 ml IV/IM PRN and Benadryl 25-50 IV/IM PRN s/s of anaphylaxis.   Instructions [facility] Infusion Coordinator (RN) to assist per patient IV care needs in the home PRN.      05/16/18 1434          Diagnostic Studies: Dg Knee Left Port  Result Date: 05/15/2018 CLINICAL DATA:  Status post left total knee joint prosthesis placement. EXAM: PORTABLE LEFT KNEE - 1-2 VIEW COMPARISON:  MRI of the left knee of August 15, 2015 FINDINGS: The patient has undergone total knee joint prosthesis placement. Radiographic positioning of the prosthetic components is good. The interface with the  native bone appears normal. There is a small amount of air in fluid in the anterior aspect of the joint space. IMPRESSION: No immediate postprocedure complication following left total knee joint prosthesis placement. Electronically Signed   By: Lashan  [email or web address].D.   On: 05/15/2018 12:04   [place] Site Rite  Result Date: 05/15/2018 If Site Rite image not attached, placement could not be confirmed due to current cardiac rhythm.   Disposition:   Discharge Instructions    Home infusion instructions [facility] May follow [facility] [place] Protocol; May administer Cathflo as needed to maintain patency of vascular access device.; Flushing of vascular access device: [facility]: 0.9% NaCl pre/post medica...   Complete by:  As directed    Instructions:  [place] follow [place]   Instructions:  May administer Cathflo as needed to maintain patency of vascular access device.   Instructions:  Flushing of vascular access device: [facility]: 0.9% NaCl pre/post medication administration and prn patency; Heparin 100 u/ml, 69m44mor implanted ports and Heparin 10u/ml, 69ml20mr all other central venous catheters.   Instructions:  May follow [facility] Anaphylaxis Protocol for First Dose Administration in the home: 0.9% NaCl at 25-50 ml/hr to maintain IV access for protocol meds. Epinephrine 0.3 ml IV/IM PRN and Benadryl 25-50 IV/IM PRN s/s of anaphylaxis.   Instructions:  [place] Coordinator (RN) to assist per patient IV care needs in the home PRN.      Follow-up Information    [facility]. Schedule an appointment as soon as possible for a visit in 1 weeks.   Specialty:  Orthopedic Surgery Contact information: [address] [place] [phone]        [facility], [facility] Follow up.   Specialty:  [place]:  physical therapy and nurse to assist with IV abx Contact information: [phone]  Signed: Johnny Bridge 05/16/2018, 2:35 PM

## 2018-05-16 NOTE — Consult Note (Signed)
Date of Admission:  05/15/2018          Reason for Consult:  Left prosthetic knee infection  Referring Provider: Dr. Mardelle Matte   Assessment:  1. 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 Obesity 3. Hypertension  Plan:  1. Continue cefepime 2. Follow-up cultures from the OR 3. If nothing grows I would continue him on cefepime and complete a 6-week course with aggressive dosing of this antibiotic.  4. If he indeed grows an organism I would prefer to follow his 6 weeks of parenteral therapy with oral therapy for potentially 6 months  Principal Problem:   Failed partial knee, left (HCC)   Scheduled Meds: . allopurinol  150 mg Oral QPM  . aspirin EC  325 mg Oral BID  . calcium carbonate  1 tablet Oral Daily   And  . cholecalciferol  800 Units Oral Daily  . diltiazem  240 mg Oral QPM  . docusate sodium  100 mg Oral BID  . loratadine  10 mg Oral Daily  . multivitamin with minerals  1 tablet Oral Daily  . pantoprazole  80 mg Oral Daily  . pramipexole  0.5 mg Oral QHS   Continuous Infusions: . 0.45 % NaCl with KCl 20 mEq / L 75 mL/hr at 05/16/18 1400  . ceFEPime (MAXIPIME) IV 2 g (05/16/18 1625)  . methocarbamol (ROBAXIN) IV Stopped (05/15/18 1213)   PRN Meds:.acetaminophen, alum & mag hydroxide-simeth, bisacodyl, diphenhydrAMINE, HYDROmorphone (DILAUDID) injection, magnesium citrate, menthol-cetylpyridinium **OR** phenol, methocarbamol **OR** methocarbamol (ROBAXIN) IV, metoCLOPramide **OR** metoCLOPramide (REGLAN) injection, ondansetron **OR** ondansetron (ZOFRAN) IV, oxyCODONE, oxyCODONE, polyethylene glycol, sodium chloride flush, sodium chloride flush, SUMAtriptan, valACYclovir, zolpidem  HPI: Marvin Munoz is a 57 y.o. male prior partial left knee replacement which was causing him persistent symptoms of pain and intermittent effusions that were worked up with aspirations to look for gout pseudogout and infection.  Ultimately decision made to convert partial knee  replacement to total knee arthroplasty.  He was taken to the operating room by Dr. Mardelle Matte on 26 November.  In the OR he was found to have extensive hypertrophic scar.  He had brownish appearance of the synovium.  There was a strange black appearance to the cartilage itself in the area and some softening the patellar cartilage.  He sampled this area for possible infection with a cell count having been sent along with a culture.  Infection however was not obviously identified at the time of surgery and the patient was converted to a full knee arthroplasty.  Subsequently cell count came back with 265 white blood cells with 94% monocytes and 6% lymphocytes.  Gram stain showed a few gram-negative rods though culture has not grown anything when I checked with the lab again today.  This is indeed a strange appearance and not clear-cut for infection but with the cell count being elevated and gram-negative rods having been seen on Gram stain and his history I would prefer to treat him with systemic antibiotics and gram-negative rods.  We have chosen cefepime to target potentially Pseudomonas and are going for an aggressive dose.  We will plan on giving him a 6-week course which we will tailor if we can identify microbe.  It would identify microbe and I would definitely follow this systemic antibiotic with a course of oral antibiotics up to 6 months given that this is a prosthetic joint with new prosthesis in place.  Follow patient's cultures over the next few days to be available for any questions.  There is urgency to discharge him in the next day or 2 I would use cefepime unless we have identified an organism that would allow [place] to narrow further  Review of Systems: Review of Systems  Constitutional: Negative for chills, diaphoresis, fever, malaise/fatigue and weight loss.  HENT: Negative for congestion, hearing loss, sore throat and tinnitus.   Eyes: Negative for blurred vision and double vision.    Respiratory: Negative for cough, sputum production, shortness of breath and wheezing.   Cardiovascular: Negative for chest pain, palpitations and leg swelling.  Gastrointestinal: Negative for abdominal pain, blood in stool, constipation, diarrhea, heartburn, melena, nausea and vomiting.  Genitourinary: Negative for dysuria, flank pain and hematuria.  Musculoskeletal: Positive for joint pain and myalgias. Negative for back pain and falls.  Skin: Negative for itching and rash.  Neurological: Negative for dizziness, sensory change, focal weakness, loss of consciousness, weakness and headaches.  Endo/Heme/Allergies: Does not bruise/bleed easily.  Psychiatric/Behavioral: Negative for depression, memory loss and suicidal ideas. The patient is not nervous/anxious.     Past Medical History:  Diagnosis Date  . Allergic rhinitis   . Eczema   . Fever blister    occasional  . Foot drop, right 09/24/2014  . Gastroesophageal reflux   . Gout    05-09-2018 per pt last flare up, 2014  . Guillain Barr syndrome ([place]) 1970's   per pt received swine flu vaccination and regular flu vaccination at same time, was told one or the other may have caused guillain barre  . HTN (hypertension)   . Migraine headache   . Mild aortic valve stenosis cardiologist-  dr Daneen Schick   per last echo  02-01-2017   moderately thickended and calcificed leaflets with mild AS and mild AR,  valve area 2.43cm^2  . 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 Nocturia   . Nocturnal leg cramps   . OA (osteoarthritis)    back,  left knee  . PONV (postoperative nausea and vomiting)   . PVC's (premature ventricular contractions)    hx of benign  . Restless legs syndrome (RLS)   . Thoracic ascending aortic aneurysm [facility])    followed by dr h. Tamala Julian--- per last CTA 02-21-2018 , 41mm    Social History   Tobacco Use  .  Smoking status: Never Smoker  . Smokeless tobacco: Never Used  Substance Use Topics  . Alcohol use: Not Currently    Alcohol/week: 0.0 standard drinks  . Drug use: Never    Family History  Problem Relation Age of Onset  . Heart attack Father   . Prostate cancer Father   . COPD Mother        smoked  . Asthma Mother   . Arthritis/Rheumatoid Mother   . Leukemia Maternal Grandfather   . Prostate cancer Maternal Grandfather   . Prostate cancer Maternal Uncle    Allergies  Allergen Reactions  . Colchicine Diarrhea  . Influenza Vaccines Other (See Comments)    Guillain-barre syndrome  . Adhesive [Tape] Rash    OBJECTIVE: Blood pressure 107/75, pulse 87, temperature 99 F (37.2 C), temperature source Oral, resp. rate 16, height 6\' 3"  (1.905 m), weight 97.5 kg, SpO2 94 %.  Physical Exam  Constitutional: He is oriented to person, place, and time. He appears well-developed and well-nourished. He is cooperative. He does not appear ill. No distress.  HENT:  Head: Normocephalic and atraumatic.  Right Ear: Hearing and external ear normal.  Left Ear: Hearing and external ear normal.  Nose: No rhinorrhea or nasal deformity. No epistaxis.  Eyes: Pupils are equal, round, and reactive to light. Conjunctivae and EOM are normal. Right conjunctiva is not injected. Left conjunctiva is not injected. No scleral icterus.  Neck: Normal range of motion. Neck supple. No JVD present.  Cardiovascular: Normal rate, regular rhythm, S1 normal, S2 normal and normal heart sounds. Exam reveals no friction rub.  No murmur heard. Abdominal: Soft. Normal appearance and bowel sounds are normal. He exhibits no distension and no ascites. There is no hepatosplenomegaly. There is no tenderness.  Musculoskeletal:       Right shoulder: Normal.       Left shoulder: Normal.       Right hip: Normal.       Left hip: Normal.       Right knee: Normal.       Left knee: Normal.       Legs: Lymphadenopathy:       Head  (right side): No submandibular, no preauricular and no posterior auricular adenopathy present.       Head (left side): No submandibular, no preauricular and no posterior auricular adenopathy present.    He has no cervical adenopathy.       Right cervical: No superficial cervical and no deep cervical adenopathy present.      Left cervical: No superficial cervical and no deep cervical adenopathy present.  Neurological: He is alert and oriented to person, place, and time. He has normal strength. No sensory deficit. Coordination and gait normal.  Skin: Skin is warm, dry and intact. No abrasion, no bruising, no ecchymosis, no lesion and no rash noted. He is not diaphoretic. No cyanosis or erythema. No pallor. Nails show no clubbing.  Psychiatric: He has a normal mood and affect. His speech is normal and behavior is normal. Judgment and thought content normal. Cognition and memory are normal. He is attentive.    Lab Results Lab Results  Component Value Date   WBC 6.9 05/16/2018   HGB 11.4 (L) 05/16/2018   HCT 34.4 (L) 05/16/2018   MCV 97.5 05/16/2018   PLT 148 (L) 05/16/2018    Lab Results  Component Value Date   CREATININE 0.97 05/16/2018   BUN 10 05/16/2018   NA 139 05/16/2018   K 4.1 05/16/2018   CL 105 05/16/2018   CO2 29 05/16/2018   No results found for: ALT, AST, GGT, ALKPHOS, BILITOT   Microbiology: Recent Results (from the past 240 hour(s))  Surgical pcr screen     Status: None   Collection Time: 05/09/18  2:11 PM  Result Value Ref Range Status   MRSA, PCR NEGATIVE NEGATIVE Final   Staphylococcus aureus NEGATIVE NEGATIVE Final    Comment: (NOTE) The Xpert SA Assay (FDA approved for NASAL specimens in patients 75 years of age and older), is one component of a comprehensive surveillance program. It is not intended to diagnose infection nor to guide or monitor treatment. Performed at [facility], [place] [address], [place], [place] [address]     Aerobic/Anaerobic Culture (surgical/deep wound)     Status: None (Preliminary result)   Collection Time: 05/15/18  8:32 AM  Result Value Ref Range Status   Specimen Description   Final    SYNOVIAL LEFT KNEE Performed at [facility] [address] [place], [place] [address]    Special Requests   Final  PATIENT ON FOLLOWING ANCEF 2GRAMS Performed at [facility] [address], [place], [place] [address]    Gram Stain   Final    RARE WBC PRESENT,BOTH PMN AND MONONUCLEAR NO ORGANISMS SEEN    Culture   Final    NO GROWTH 1 DAY Performed at [facility], [place] [address], [place], [place] [address]    Report Status PENDING  Incomplete  Aerobic/Anaerobic Culture (surgical/deep wound)     Status: None (Preliminary result)   Collection Time: 05/15/18  8:43 AM  Result Value Ref Range Status   Specimen Description   Final    TISSUE LEFT KNEE Performed at [facility] [address] [place], [place] [address]    Special Requests   Final    PATIENT ON FOLLOWING ANCEF 2G Performed at [facility] [address], [place], [place] [address]    Gram Stain   Final    RARE WBC PRESENT, PREDOMINANTLY PMN FEW GRAM NEGATIVE RODS    Culture   Final    NO GROWTH 1 DAY Performed at [facility], [place] [address] [place], [place] [address]    Report Status PENDING  Incomplete    Alcide Evener, [facility] [phone] pager  05/16/2018, 5:05 PM

## 2018-05-16 NOTE — Progress Notes (Addendum)
Patient ID: Marvin Munoz, male   DOB: 10/05/1960, 57 y.o.   MRN: [id number]     Subjective:  Patient reports pain as mild.  patient has nausea but states that this is improved  Objective:   VITALS:   Vitals:   05/15/18 2121 05/16/18 0102 05/16/18 0602 05/16/18 0918  BP: 115/77 115/77 112/72 107/75  Pulse: 63 84 84 87  Resp: 16 15 16 16   Temp: 97.7 F (36.5 C) 98.7 F (37.1 C) 98.1 F (36.7 C) 99 F (37.2 C)  TempSrc: Axillary Oral Oral Oral  SpO2: 100% 100% 100% 94%  Weight:      Height:        ABD soft Sensation intact distally Dorsiflexion/Plantar flexion intact Incision: dressing C/D/I and moderate drainage  Lab Results  Component Value Date   WBC 6.9 05/16/2018   HGB 11.4 (L) 05/16/2018   HCT 34.4 (L) 05/16/2018   MCV 97.5 05/16/2018   PLT 148 (L) 05/16/2018   BMET    Component Value Date/Time   NA 139 05/16/2018 0540   K 4.1 05/16/2018 0540   CL 105 05/16/2018 0540   CO2 29 05/16/2018 0540   GLUCOSE 119 (H) 05/16/2018 0540   BUN 10 05/16/2018 0540   CREATININE 0.97 05/16/2018 0540   CALCIUM 8.7 (L) 05/16/2018 0540   GFRNONAA >60 05/16/2018 0540   GFRAA >60 05/16/2018 0540     Assessment/Plan: 1 Day Post-Op   Principal Problem:   Failed partial knee, left (HCC)   Advance diet Up with therapy Plan for discharge tomorrow WBAT Dry dressing PRN Waiting for ID consult   Anticipated LOS equal to or greater than 2 midnights due to - Age 3 and older with one or more of the following:  - Obesity  - Expected need for hospital services (PT, OT, Nursing) required for safe  discharge  - Anticipated need for postoperative skilled nursing care or inpatient rehab  - Active co-morbidities: None OR   - Unanticipated findings during/Post Surgery: None  - Patient is a high risk of re-admission due to: None     Lunette Stands 05/16/2018, 12:21 PM  Discussed and agree with above. He feels ok to go home today, arranged for hh/pt abx.    Marchia Bond, MD Cell [phone]

## 2018-05-16 NOTE — Care Management Note (Signed)
Case Management Note  Patient Details  Name: Marvin Munoz MRN: [id number] Date of Birth: 08-13-1960  Subjective/Objective:  Discharge planning, spoke with patient at bedside. Have chosen [facility] PT, evaluate and treat. Also needs IV abx, PICC in place, awaiting orders.   Action/Plan: [facility] for referral. Has DME. [phone]                  Expected Discharge Date:                  Expected Discharge Plan:  [place]  In-House Referral:  NA  Discharge planning Services  CM Consult  Post Acute Care Choice:  Home Health, NA Choice offered to:  Patient  DME Arranged:  N/A DME Agency:  NA  HH Arranged:  PT, RN, Disease Management [place] Agency:  [facility] [employer] [place]  Status of Service:  Completed, signed off  If discussed at [employer] of Stay Meetings, dates discussed:    Additional Comments:  Guadalupe Maple, RN 05/16/2018, 10:07 AM

## 2018-05-16 NOTE — Progress Notes (Signed)
Patient discharged to home w/ family. Given all belongings, instructions, prescriptions, equipment. Wife present for all teaching. Both verbalized understanding of instructions. Escorted to pov via w/c.

## 2018-05-20 LAB — AEROBIC/ANAEROBIC CULTURE (SURGICAL/DEEP WOUND): CULTURE: NO GROWTH

## 2018-05-20 LAB — AEROBIC/ANAEROBIC CULTURE W GRAM STAIN (SURGICAL/DEEP WOUND): Culture: NO GROWTH

## 2018-05-21 ENCOUNTER — Telehealth: Payer: Self-pay | Admitting: *Deleted

## 2018-05-21 NOTE — Telephone Encounter (Signed)
Patient called, asking if lab results were back yet that would change the medication he is on via [facility]. Patient will run out of cefepime tomorrow. Please advise with orders for [facility] pharmacy. Landis Gandy, RN

## 2018-05-21 NOTE — Telephone Encounter (Signed)
Please also advise when/with whom she should follow up.

## 2018-05-21 NOTE — Telephone Encounter (Signed)
Sharyn Lull I dutifully followed his cultures till they were finalized yesterday and with no growth of any organism.  I would keep him on his current antimicrobial regimen via [facility] and he should follow-up with me after completing his antibiotics which will be in roughly 6 weeks.  I think that Marya Amsler or Colletta Maryland was going to schedule him if they have not already put him on the schedule

## 2018-05-21 NOTE — Telephone Encounter (Signed)
Relayed to [facility] and to the patient. Their stop date is 06/27/18. He is scheduled to follow up 07/02/18.

## 2018-05-22 NOTE — Telephone Encounter (Signed)
[place] do you mind extending that date of the stop date to the appointment that he sees me

## 2018-05-23 NOTE — Telephone Encounter (Signed)
Relayed order to [facility] at [place].

## 2018-06-03 ENCOUNTER — Other Ambulatory Visit (HOSPITAL_COMMUNITY)
Admission: RE | Admit: 2018-06-03 | Discharge: 2018-06-03 | Disposition: A | Discharge status: Home / Self Care | Payer: 59 | Source: Other Acute Inpatient Hospital | Attending: Infectious Disease | Admitting: Infectious Disease

## 2018-06-03 DIAGNOSIS — Z5181 Encounter for therapeutic drug level monitoring: Secondary | ICD-10-CM | POA: Diagnosis not present

## 2018-06-03 LAB — CBC WITH DIFFERENTIAL/PLATELET
Abs Immature Granulocytes: 0.01 10*3/uL (ref 0.00–0.07)
BASOS PCT: 1 %
Basophils Absolute: 0 10*3/uL (ref 0.0–0.1)
EOS ABS: 0.1 10*3/uL (ref 0.0–0.5)
EOS PCT: 3 %
HCT: 37.4 % — ABNORMAL LOW (ref 39.0–52.0)
Hemoglobin: 12.3 g/dL — ABNORMAL LOW (ref 13.0–17.0)
Immature Granulocytes: 0 %
Lymphocytes Relative: 30 %
Lymphs Abs: 1.2 10*3/uL (ref 0.7–4.0)
MCH: 31.2 pg (ref 26.0–34.0)
MCHC: 32.9 g/dL (ref 30.0–36.0)
MCV: 94.9 fL (ref 80.0–100.0)
MONO ABS: 0.4 10*3/uL (ref 0.1–1.0)
MONOS PCT: 10 %
Neutro Abs: 2.2 10*3/uL (ref 1.7–7.7)
Neutrophils Relative %: 56 %
PLATELETS: 295 10*3/uL (ref 150–400)
RBC: 3.94 MIL/uL — ABNORMAL LOW (ref 4.22–5.81)
RDW: 11.5 % (ref 11.5–15.5)
WBC: 3.9 10*3/uL — ABNORMAL LOW (ref 4.0–10.5)
nRBC: 0 % (ref 0.0–0.2)

## 2018-06-03 LAB — BASIC METABOLIC PANEL
ANION GAP: 7 (ref 5–15)
BUN: 19 mg/dL (ref 6–20)
CALCIUM: 9.6 mg/dL (ref 8.9–10.3)
CO2: 26 mmol/L (ref 22–32)
Chloride: 104 mmol/L (ref 98–111)
Creatinine, Ser: 0.98 mg/dL (ref 0.61–1.24)
GFR calc Af Amer: >60 mL/min (ref >60)
GLUCOSE: 116 mg/dL — AB (ref 70–99)
Potassium: 4 mmol/L (ref 3.5–5.1)
Sodium: 137 mmol/L (ref 135–145)

## 2018-06-03 LAB — SEDIMENTATION RATE: Sed Rate: 12 mm/hr (ref 0–16)

## 2018-06-03 LAB — C-REACTIVE PROTEIN: CRP: <0.8 mg/dL (ref <1.0)

## 2018-06-11 ENCOUNTER — Encounter (HOSPITAL_COMMUNITY): Payer: Self-pay | Admitting: Orthopedic Surgery

## 2018-07-02 ENCOUNTER — Ambulatory Visit: Payer: 59 | Admitting: Infectious Diseases

## 2018-07-02 ENCOUNTER — Encounter: Payer: Self-pay | Admitting: Infectious Diseases

## 2018-07-02 ENCOUNTER — Telehealth: Payer: Self-pay

## 2018-07-02 VITALS — BP 124/74 | HR 75 | Temp 98.3°F | Wt 214.0 lb

## 2018-07-02 DIAGNOSIS — T84093A Other mechanical complication of internal left knee prosthesis, initial encounter: Secondary | ICD-10-CM

## 2018-07-02 DIAGNOSIS — Z96659 Presence of unspecified artificial knee joint: Secondary | ICD-10-CM | POA: Insufficient documentation

## 2018-07-02 DIAGNOSIS — T84093D Other mechanical complication of internal left knee prosthesis, subsequent encounter: Secondary | ICD-10-CM

## 2018-07-02 DIAGNOSIS — Z95828 Presence of other vascular implants and grafts: Secondary | ICD-10-CM

## 2018-07-02 DIAGNOSIS — Z96652 Presence of left artificial knee joint: Secondary | ICD-10-CM

## 2018-07-02 DIAGNOSIS — Z5181 Encounter for therapeutic drug level monitoring: Secondary | ICD-10-CM | POA: Insufficient documentation

## 2018-07-02 DIAGNOSIS — Z888 Allergy status to other drugs, medicaments and biological substances status: Secondary | ICD-10-CM

## 2018-07-02 DIAGNOSIS — T8459XD Infection and inflammatory reaction due to other internal joint prosthesis, subsequent encounter: Secondary | ICD-10-CM

## 2018-07-02 DIAGNOSIS — Z91048 Other nonmedicinal substance allergy status: Secondary | ICD-10-CM

## 2018-07-02 DIAGNOSIS — T8459XA Infection and inflammatory reaction due to other internal joint prosthesis, initial encounter: Secondary | ICD-10-CM | POA: Insufficient documentation

## 2018-07-02 DIAGNOSIS — Z887 Allergy status to serum and vaccine status: Secondary | ICD-10-CM | POA: Diagnosis not present

## 2018-07-02 DIAGNOSIS — Z452 Encounter for adjustment and management of vascular access device: Secondary | ICD-10-CM

## 2018-07-02 MED ORDER — CIPROFLOXACIN HCL 750 MG PO TABS: 750.0000 mg | ORAL_TABLET | Freq: Two times a day (BID) | ORAL | 0 refills | Status: AC

## 2018-07-02 NOTE — Assessment & Plan Note (Signed)
All OPAT lab work reviewed and within normal limits.  Anemia has resolved (Hgb 13.2), slight leucopenia noted, normal creatinine. Inflammatory markers from 12/30 have been treated down to normal range. Repeat labs in 3 weeks following transition to oral regimen.

## 2018-07-02 NOTE — Patient Instructions (Addendum)
Wonderful to meet you today!  I will plan to continue you on a medication by mouth called ciprofloxacin for a few more weeks to make sure you continue to improve.   Ciprofloxacin 1 pill twice a day. Please avoid taking with any calcium containing supplements (tums, calcium).   Please return in 3 weeks to check in. If you feel that you have increased joint pain (hips/shoulders/ankles) please stop medication and call the office @ [phone].

## 2018-07-02 NOTE — Progress Notes (Signed)
Patient: Marvin Munoz  DOB: May 12, 1961 MRN: [id number] PCP: Josetta Huddle, MD  Referring Provider: Mardelle Matte    Patient Active Problem List   Diagnosis Date Noted  . 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 PICC (peripherally inserted central catheter) in place 07/02/2018  . Medication monitoring encounter 07/02/2018  . Failed partial knee, left ([place]) 05/15/2018  . Nocturnal leg cramps 09/26/2014  . Foot drop, right 09/24/2014  . 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 Upper airway cough syndrome 09/10/2014  . Aortic valve disease 11/19/2013  . Essential hypertension 11/19/2013  . Esophageal reflux 11/19/2013  . Gout 11/19/2013  . Aortic root enlargement ([place]) 11/19/2013     Subjective:  CC:  Hospital follow up after possible prosthetic left knee infection.   HPI:  Marvin Munoz is a 58 y.o. male.   Marvin Munoz underwent a partial left knee replacement about 2 years ago. He presented outpatient to Dr. Mardelle Matte with about 1 year of recurrent effusions, difficulty walking and persistent intermittent pain that was described to be moderate to severe. Preoperative work up demonstrated synovitis on bone scan with negative gout or infectious work up with aspirations of knee. On 05-15-18 he underwent surgical revision and conversion to total knee replacement. Operative note reviewed - substantial hypertrophic scar with very thickened tissues; brownish appearance to the synovium (?pigmented villondular synvitis); The undersurface of the patella and lateral compartment had a strange blackish appearance to the   Has been doing well following his surgery and now nearly 7 weeks post op. Participating in exercises as directed with Dr. Mardelle Matte. He does have some ongoing warmth and "puffiness" around his knee and some stiffness. Incision is well healed. No fevers or sweats but does feel cold often and reports some fatigue, low energy and decreased appetitie.  Pain is still present in the knee but better than what he was experiencing pre-op. Has some nausea (controlled with zofran), drinks plenty of fluids. Has not been back to work yet. Prior to surgery he was very active and works on yards/landscaping, golf, and walking often.   PICC line is without pain, drainage or erythema and is well maintained by [facility] Team. No swelling or altered sensation in affected distal extremity. He is doing well with TID dosing of antibiotics.   Review of Systems  Constitutional: Positive for chills and malaise/fatigue. Negative for fever and weight loss.  HENT: Negative for congestion and sore throat.   Respiratory: Negative for cough and sputum production.   Cardiovascular: Negative for chest pain and leg swelling.  Gastrointestinal: Negative for abdominal pain, diarrhea and vomiting.  Genitourinary: Negative for dysuria and flank pain.  Musculoskeletal: Positive for joint pain (left knee). Negative for myalgias and neck pain.  Skin: Negative for rash.  Neurological: Negative for dizziness, tingling and headaches.  Psychiatric/Behavioral: Negative for depression. The patient does not have insomnia.     Past Medical History:  Diagnosis Date  . Allergic rhinitis   . Eczema   . Fever blister    occasional  . Foot drop, right 09/24/2014  . Gastroesophageal reflux   . Gout    05-09-2018 per pt last flare up, 2014  . Guillain Barr syndrome ([place]) 1970's   per pt received swine flu vaccination and regular flu vaccination at same time, was told one or the other may have caused guillain barre  . HTN (hypertension)   . Migraine headache   . Mild aortic valve  stenosis cardiologist-  dr Daneen Schick   per last echo  02-01-2017   moderately thickended and calcificed leaflets with mild AS and mild AR,  valve area 2.43cm^2  . 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 Nocturia   . Nocturnal leg cramps   . OA (osteoarthritis)    back,  left knee  . PONV (postoperative nausea and vomiting)   . PVC's (premature ventricular contractions)    hx of benign  . Restless legs syndrome (RLS)   . Thoracic ascending aortic aneurysm [facility])    followed by dr h. Tamala Julian--- per last CTA 02-21-2018 , 2mm    Outpatient Medications Prior to Visit  Medication Sig Dispense Refill  . allopurinol (ZYLOPRIM) 300 MG tablet Take 150 mg by mouth every evening.     Marland Kitchen aspirin EC 325 MG tablet Take 1 tablet (325 mg total) by mouth 2 (two) times daily. 60 tablet 0  . baclofen (LIORESAL) 10 MG tablet Take 1 tablet (10 mg total) by mouth 3 (three) times daily. As needed for muscle spasm 50 tablet 0  . Calcium Carb-Cholecalciferol (CALCIUM + D3 PO) Take 1 tablet by mouth daily.     . cetirizine (ZYRTEC) 10 MG tablet Take 10 mg by mouth every morning.     . diltiazem (CARDIZEM CD) 240 MG 24 hr capsule Take 240 mg by mouth every evening.     Marland Kitchen FIBER PO Take 1 capsule by mouth daily.    . Multiple Vitamin (MULTIVITAMIN) tablet Take 1 tablet by mouth daily.    Marland Kitchen omeprazole (PRILOSEC) 40 MG capsule Take 40 mg by mouth every morning.     . ondansetron (ZOFRAN) 4 MG tablet Take 1 tablet (4 mg total) by mouth every 8 (eight) hours as needed for nausea or vomiting. 10 tablet 0  . pramipexole (MIRAPEX) 0.5 MG tablet Take 0.5 mg by mouth at bedtime.     . Psyllium (METAMUCIL FIBER) 51.7 % PACK Take by mouth.    . rizatriptan (MAXALT-MLT) 10 MG disintegrating tablet Take 10 mg by mouth as needed for migraine. May repeat in 2 hours if needed    . sennosides-docusate sodium (SENOKOT-S) 8.6-50 MG tablet Take 2 tablets by mouth daily. 30 tablet 1  . traMADol (ULTRAM) 50 MG tablet Take 50 mg by mouth every 12 (twelve) hours as needed.    . valACYclovir (VALTREX) 1000 MG tablet Take 1,000 mg by mouth daily as needed (for cold sores).     Marland Kitchen oxyCODONE (ROXICODONE) 5 MG immediate release  tablet Take 1 tablet (5 mg total) by mouth every 4 (four) hours as needed for severe pain. (Patient not taking: Reported on 07/02/2018) 30 tablet 0   No facility-administered medications prior to visit.      Allergies  Allergen Reactions  . Colchicine Diarrhea  . Influenza Vaccines Other (See Comments)    Guillain-barre syndrome  . Adhesive [Tape] Rash    Social History   Tobacco Use  . Smoking status: Never Smoker  . Smokeless tobacco: Never Used  Substance Use Topics  . Alcohol use: Not Currently    Alcohol/week: 0.0 standard drinks  . Drug use: Never    Family History  Problem Relation Age of Onset  . Heart attack Father   . Prostate cancer Father   . COPD Mother        smoked  . Asthma Mother   .  Arthritis/Rheumatoid Mother   . Leukemia Maternal Grandfather   . Prostate cancer Maternal Grandfather   . Prostate cancer Maternal Uncle     Objective:   Vitals:   07/02/18 0845  BP: 124/74  Pulse: 75  Temp: 98.3 F (36.8 C)  Weight: 214 lb (97.1 kg)   Body mass index is 26.75 kg/m.  Physical Exam Vitals signs and nursing note reviewed.  Constitutional:      General: He is not in acute distress.    Appearance: Normal appearance. He is well-developed. He is not toxic-appearing.     Comments: Seated comfortably in chair during visit.   HENT:     Mouth/Throat:     Mouth: Mucous membranes are moist.     Dentition: Normal dentition. No dental abscesses.     Pharynx: Oropharynx is clear.  Eyes:     General: No scleral icterus.    Pupils: Pupils are equal, round, and reactive to light.  Cardiovascular:     Rate and Rhythm: Normal rate and regular rhythm.     Heart sounds: Normal heart sounds.  Pulmonary:     Effort: Pulmonary effort is normal.     Breath sounds: Normal breath sounds.  Abdominal:     General: There is no distension.     Palpations: Abdomen is soft.     Tenderness: There is no abdominal tenderness.  Musculoskeletal:        General:  Swelling (left knee with generalized swelling, no effusion detected. Inciison line is clean and intact. ) present.     Comments: RUE PICC line - clean/dry dressing. Insertion site w/o erythema, tenderness, drainage, cording or distal swelling of affected extremity    Lymphadenopathy:     Cervical: No cervical adenopathy.  Skin:    General: Skin is warm and dry.     Findings: No rash.  Neurological:     Mental Status: He is alert and oriented to person, place, and time.  Psychiatric:        Judgment: Judgment normal.     Comments: In good spirits today and engaged in care discussion.      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 He has been treated with 1 stage revision/conversion after debridement intraop. 1/2 intraop cultures with rare gram negative rods on stain but no growth from either. He was not on antibiotics prior to surgery. Will transition to 4 weeks of ciprofloxacin considering his mild warmth, malaise and stiffness/pain of joint; return in about 3 weeks to follow up. Precautions given. He sees Dr. Mardelle Matte this week as well.       PICC (peripherally inserted central catheter) in place    Site unremarkable, well maintained and  functioning as expected. RN coming out today - can pull picc after labs are drawn.          Medication monitoring encounter    All OPAT lab work reviewed and within normal limits.  Anemia has resolved (Hgb 13.2), slight leucopenia noted, normal creatinine. Inflammatory markers from 12/30 have been treated down to normal range. Repeat labs in 3 weeks following transition to oral regimen.          Janene Madeira, MSN, NP-C [facility] for [facility] [place] Pager: [phone] Office: [phone]  07/02/18  1:08 PM

## 2018-07-02 NOTE — Assessment & Plan Note (Signed)
Site unremarkable, well maintained and functioning as expected. RN coming out today - can pull picc after labs are drawn.

## 2018-07-02 NOTE — Assessment & Plan Note (Addendum)
Kind of a mixed picture, but enough circumstantial evidence infection contributing likely at some point with presentation. He has been treated with 1 stage revision/conversion after debridement intraop. 1/2 intraop cultures with rare gram negative rods on stain but no growth from either. He was not on antibiotics prior to surgery. Will transition to 4 weeks of ciprofloxacin considering his mild warmth, malaise and stiffness/pain of joint; return in about 3 weeks to follow up. Precautions given. He sees Dr. Mardelle Matte this week as well.

## 2018-07-02 NOTE — Telephone Encounter (Signed)
Error

## 2018-07-02 NOTE — Telephone Encounter (Signed)
[facility] Gave verbal order per Janene Madeira NP.  to draw routine lab work today and pull picc. Debbie verbalized understanding with read back, Jackelyn Poling had no additional questions.   Patient is aware of the above plan.   Marvin Munoz, [place]

## 2018-07-03 NOTE — Telephone Encounter (Signed)
Thank you :)

## 2018-07-09 ENCOUNTER — Ambulatory Visit (HOSPITAL_COMMUNITY): Payer: 59 | Attending: Internal Medicine | Admitting: Physical Therapy

## 2018-07-09 ENCOUNTER — Other Ambulatory Visit: Payer: Self-pay

## 2018-07-09 DIAGNOSIS — M6281 Muscle weakness (generalized): Secondary | ICD-10-CM | POA: Diagnosis present

## 2018-07-09 DIAGNOSIS — M25662 Stiffness of left knee, not elsewhere classified: Secondary | ICD-10-CM | POA: Diagnosis not present

## 2018-07-09 NOTE — Patient Instructions (Addendum)
Self-Mobilization: Heel Slide (Supine)    Slide left heel toward buttocks until a gentle stretch is felt. Hold __2__ seconds. Relax. Repeat _10___ times per set. Do _1___ sets per session. Do __2__ sessions per day.  http://orth.exer.us/710   Copyright  VHI. All rights reserved.  Strengthening: Quadriceps Set    Tighten muscles on top of thighs by pushing knees down into surface. Hold __5__ seconds. Repeat __10__ times per set. Do __1__ sets per session. Do _2___ sessions per day.  http://orth.exer.us/602   Copyright  VHI. All rights reserved.  Knee Extension Mobilization: Towel Prop    With rolled towel under right ankle, place _2___ pound weight across knee. Hold __30__ minutes. Repeat __1__ times per set. Do __1__ sets per session. Do _2___ sessions per day.  http://orth.exer.us/720   Copyright  VHI. All rights reserved.  Stretching: Hamstring (Supine)    Supporting right thigh behind knee, slowly straighten knee until stretch is felt in back of thigh. Hold __30__ seconds.Pull heel to buttock and hold  Repeat __3 times per set. Do _1___ sets per session. Do __2_ sessions per day.  http://orth.exer.us/656   Copyright  VHI. All rights reserved.  Heel Raise: Unilateral (Standing)    Balance on left foot, then rise on ball of foot. Repeat _10___ times per set. Do _1___ sets per session. Do _2___ sessions per day.  http://orth.exer.us/40   Copyright  VHI. All rights reserved.  Functional Quadriceps: Sit to Stand    Sit on edge of chair, feet flat on floor. Stand upright, extending knees fully. Repeat _10___ times per set. Do _1___ sets per session. Do __2__ sessions per day.  http://orth.exer.us/734   Copyright  VHI. All rights reserved.

## 2018-07-09 NOTE — Therapy (Signed)
Marvin Munoz, [place], [address] Phone: [phone]   Fax:  [phone]  Physical Therapy Evaluation  Patient Details  Name: Marvin Munoz MRN: [id number] Date of Birth: 04-19-1961 Referring Provider (PT): Marvin Munoz   Encounter Date: 07/09/2018  PT End of Session - 07/09/18 1250    Visit Number  1    Number of Visits  10    Date for PT Re-Evaluation  08/08/18    Authorization - Visit Number  1    Authorization - Number of Visits  60    PT Start Time  0815    PT Stop Time  0900    PT Time Calculation (min)  45 min    Activity Tolerance  Patient tolerated treatment well    Behavior During Therapy  [facility] for tasks assessed/performed       Past Medical History:  Diagnosis Date  . Allergic rhinitis   . Eczema   . Fever blister    occasional  . Foot drop, right 09/24/2014  . Gastroesophageal reflux   . Gout    05-09-2018 per pt last flare up, 2014  . Guillain Barr syndrome ([place]) 1970's   per pt received swine flu vaccination and regular flu vaccination at same time, was told one or the other may have caused guillain barre  . HTN (hypertension)   . Migraine headache   . Mild aortic valve stenosis cardiologist-  dr Marvin Munoz   per last echo  02-01-2017   moderately thickended and calcificed leaflets with mild AS and mild AR,  valve area 2.43cm^2  . 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 Nocturia   . Nocturnal leg cramps   . OA (osteoarthritis)    back,  left knee  . PONV (postoperative nausea and vomiting)   . PVC's (premature ventricular contractions)    hx of benign  . Restless legs syndrome (RLS)   . Thoracic ascending aortic aneurysm [facility])    followed by dr h. Tamala Julian--- per last CTA 02-21-2018 , 85mm    Past Surgical History:  Procedure Laterality Date  . BREAST LUMPECTOMY Right  1990s   "benign"  . CARDIAC CATHETERIZATION  09/10/2015   dr Marvin Munoz  @[facility]    normal coronaries and LVF,  false positive cardiolite  . COLONOSCOPY    . KNEE ARTHROSCOPY WITH MEDIAL MENISECTOMY Left 05/15/2018   Procedure: LEFT KNEE ARTHROSCOPY WITH MEDIAL MENISECTOMY;  Surgeon: Marvin Bond, MD;  Location: [facility];  Service: Orthopedics;  Laterality: Left;  . REPLACEMENT UNICONDYLAR JOINT KNEE Left 09/10/2015    dr Marvin Munoz  @[facility]   . TENDON RECONSTRUCTION Right 06/02/2016   Procedure: right elbow extensor tendon repair and debridement and repair lateral collateral ligament;  Surgeon: Marvin Lights, MD;  Location: [place];  Service: Orthopedics;  Laterality: Right;  . TONSILLECTOMY  child  . TOTAL KNEE ARTHROPLASTY WITH REVISION COMPONENTS Left 05/15/2018   Procedure: conversion to LEFT total KNEE ARTHROPLASTY with hardware removal;  Surgeon: Marvin Bond, MD;  Location: [facility];  Service: Orthopedics;  Laterality: Left;  with block  . TRANSTHORACIC ECHOCARDIOGRAM  02/01/2017   mild LVH,  ef 84-66%, Grade 2 diastolic dysfucntion/  moderately thickened moderately calcified AV leafleat with mild stenosis and moderate regurg. (valve area 2.43cm^2)/  mild to moderate calcified MV annulus with triv. regurg/ trivial TR/  m;ild RAE  There were no vitals filed for this visit.   Subjective Assessment - 07/09/18 0826    Subjective  Mr. Scheiber states that he had a LT TKR on 05/15/2018.  He had had a partial Lt TKR; when he went into have the Lt TKR there was significant infection in his knee and had a pic line placed.  The pic line has just recently been removed.  Home health therapy stopped coming to his house in mid December.  He continued to complete his HEP.  At this time he feels that he his unable to stand or walk for very long.      Pertinent History  OA     Limitations  Standing;Walking    How long can you sit comfortably?  no problem     How long can you stand  comfortably?  5-10 minutes     How long can you walk comfortably?  15 minutes with his cane     Patient Stated Goals  to walk without an assistive device, be able to walk and stand longer,     Currently in Pain?  Yes    Pain Score  6    worst 10/10; best 3/10    Pain Location  Knee    Pain Orientation  Left    Pain Descriptors / Indicators  Aching    Pain Type  Acute pain    Pain Onset  More than a month ago    Pain Frequency  Constant    Aggravating Factors   exercising     Pain Relieving Factors  meds; ice     Effect of Pain on Daily Activities  limits          [facility] PT Assessment - 07/09/18 0001      Assessment   Medical Diagnosis  Lt TKR    Referring Provider (PT)  Marvin Munoz    Onset Date/Surgical Date  05/16/18    Next MD Visit  08/03/2018    Prior Therapy  HH      Precautions   Precautions  None      Restrictions   Weight Bearing Restrictions  No      Balance Screen   Has the patient fallen in the past 6 months  No    Has the patient had a decrease in activity level because of a fear of falling?   Yes    Is the patient reluctant to leave their home because of a fear of falling?   No      Home Environment   Living Environment  Private residence    Type of [place] to enter    Entrance Stairs-Number of Steps  3   unable to complete reciprocally     Prior Function   Level of Independence  Independent    Vocation  Full time employment    Vocation Requirements  walking and standing all day long     Leisure  golf, yard work       Cognition   Overall Cognitive Status  Within Functional Limits for tasks assessed      Observation/Other Assessments   Focus on Therapeutic Outcomes (FOTO)   28      Functional Tests   Functional tests  Single leg stance;Sit to Stand      Single Leg Stance   Comments  RT: 60";  LT: 53"      Sit to Stand   Comments  5x 34.30      ROM / Strength   AROM / PROM / Strength  AROM;Strength      AROM    AROM Assessment Site  Knee    Right/Left Knee  Left    Left Knee Extension  10    Left Knee Flexion  105      Strength   Strength Assessment Site  Hip;Knee;Ankle    Right/Left Hip  Left    Left Hip Flexion  5/5    Left Hip Extension  4+/5    Left Hip ABduction  5/5    Right/Left Knee  Left    Left Knee Flexion  4/5    Left Knee Extension  4/5    Right/Left Ankle  Left    Left Ankle Dorsiflexion  4/5    Left Ankle Plantar Flexion  4/5      Ambulation/Gait   Ambulation Distance (Feet)  608 Feet    Assistive device  Straight cane    Gait Comments  3 MWT                 Objective measurements completed on examination: See above findings.      [place] Adult PT Treatment/Exercise - 07/09/18 0001      Exercises   Exercises  Knee/Hip      Knee/Hip Exercises: Standing   Heel Raises  Left;5 reps      Knee/Hip Exercises: Seated   Long Arc Quad  Left;5 reps;Limitations    Long Arc Quad Limitations  slow and hold    Heel Slides  Left;5 reps    Sit to [employer]  5 reps;without UE support             PT Education - 07/09/18 1249    Education Details  updated HEP    Person(s) Educated  Patient    Methods  Explanation    Comprehension  Verbalized understanding;Returned demonstration       PT Short Term Goals - 07/09/18 1259      PT SHORT TERM GOAL #1   Title  PT Lt knee flexion to be to 115 to allow pt to squat easily to pick items off of the floor.     Time  2    Period  Weeks    Status  New    Target Date  07/23/18      PT SHORT TERM GOAL #2   Title  Pt Lt knee extension to be no greater than five degrees to improve pt gait.     Time  2    Period  Weeks      PT SHORT TERM GOAL #3   Title  PT Lt LE strength to be increased by 1/2 grade to be able to come from sit to stand from a low lying sofa without difficulty     Time  2    Period  Weeks    Status  New        PT Long Term Goals - 07/09/18 1301      PT LONG TERM GOAL #1   Title  PT Lt knee  flexion to be to 120 to allow pt fo be able to stand from the ground to work in his yard.     Time  4    Period  Weeks    Status  New    Target Date  08/06/18      PT LONG TERM GOAL #2   Title  Pt  Lt knee extension to be 3 or less degrees to have a normal gait     Time  4    Period  Weeks    Status  New      PT LONG TERM GOAL #3   Title  PT Lt LE strength to be increased one grade to allow pt to go up and down 12 steps in a reciprocal manner without difficulty.     Time  4    Period  Weeks    Status  New             Plan - 07/09/18 1250    Clinical Impression Statement  Mr. Reichel had a Lt TKR on 05/15/2018 after a failed partial knee.  The pt states that the surgeon stated that the partial had failed due to significant infection in his knee therefore he went home with IV antibiotics which have just recently stopped.  The patient will need to be able to be walking, standing for the majority of the day therefore he was referred to skilled physical therapy.  Evaluation demonstrates decreased ROM, decreased strength, decreased activity tolerance, increased pain and decreased balance.  Mr. Hardenbrook will benefit from skilled physical therapy to address these issues and maximize his functioning level     Clinical Presentation  Stable    Clinical Decision Making  Moderate    Rehab Potential  Good    PT Frequency  3x / week   3x a wk for 2 weeks then 2x for 2 weeks    PT Duration  4 weeks    PT Treatment/Interventions  ADLs/Self Care Home Management;Stair training;Gait training;Therapeutic activities;Passive range of motion;Manual techniques;Therapeutic exercise;Balance training;Patient/family education    PT Next Visit Plan  rocker board,  sit to stand, knee flexion, terminal knee extension standing and supine, PROM to increase ROM ; progress with ROM and strength as able     PT Home Exercise Plan  supine hamstring stretch, single leg heelraise, sit to stand, towel extension.     Consulted  and Agree with Plan of Care  Patient       Patient will benefit from skilled therapeutic intervention in order to improve the following deficits and impairments:  Abnormal gait, Decreased activity tolerance, Decreased balance, Decreased range of motion, Decreased endurance, Decreased strength, Pain, Impaired perceived functional ability  Visit Diagnosis: Stiffness of left knee, not elsewhere classified  Muscle weakness (generalized)     Problem List Patient Active Problem List   Diagnosis Date Noted  . Suspected infection of left prosthetic knee joint ([place]) 07/02/2018  . PICC (peripherally inserted central catheter) in place 07/02/2018  . Medication monitoring encounter 07/02/2018  . Failed partial knee, left ([place]) 05/15/2018  . Nocturnal leg cramps 09/26/2014  . Foot drop, right 09/24/2014  . 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 Upper airway cough syndrome 09/10/2014  . Aortic valve disease 11/19/2013  . Essential hypertension 11/19/2013  . Esophageal reflux 11/19/2013  . Gout 11/19/2013  . Aortic root enlargement [facility]) 11/19/2013   Rayetta Humphrey, PT CLT [phone] 07/09/2018, 1:03 PM  [place] [address] [place], [place], [address] Phone: [phone]   Fax:  [phone]  Name: Marvin Munoz MRN: [id number] Date of Birth: 1961/03/26

## 2018-07-10 ENCOUNTER — Encounter (HOSPITAL_COMMUNITY): Payer: Self-pay | Admitting: Physical Therapy

## 2018-07-10 ENCOUNTER — Ambulatory Visit (HOSPITAL_COMMUNITY): Payer: 59 | Admitting: Physical Therapy

## 2018-07-10 DIAGNOSIS — M6281 Muscle weakness (generalized): Secondary | ICD-10-CM

## 2018-07-10 DIAGNOSIS — M25662 Stiffness of left knee, not elsewhere classified: Secondary | ICD-10-CM | POA: Diagnosis not present

## 2018-07-10 NOTE — Therapy (Signed)
[place], [place], [address] Phone: [phone]   Fax:  [phone]  Physical Therapy Treatment  Patient Details  Name: Marvin Munoz MRN: [id number] Date of Birth: [birth year] Referring Provider (PT): Marchia Bond   Encounter Date: 07/10/2018  PT End of Session - 07/10/18 0829    Visit Number  2    Number of Visits  10    Date for PT Re-Evaluation  08/08/18    Authorization - Visit Number  2    Authorization - Number of Visits  60    PT Start Time  0815    PT Stop Time  0900    PT Time Calculation (min)  45 min    Activity Tolerance  Patient tolerated treatment well    Behavior During Therapy  [facility] for tasks assessed/performed       Past Medical History:  Diagnosis Date  . Allergic rhinitis   . Eczema   . Fever blister    occasional  . Foot drop, right 09/24/2014  . Gastroesophageal reflux   . Gout    05-09-2018 per pt last flare up, 2014  . Guillain Barr syndrome ([place]) 1970's   per pt received swine flu vaccination and regular flu vaccination at same time, was told one or the other may have caused guillain barre  . HTN (hypertension)   . Migraine headache   . Mild aortic valve stenosis cardiologist-  dr Daneen Schick   per last echo  02-01-2017   moderately thickended and calcificed leaflets with mild AS and mild AR,  valve area 2.43cm^2  . 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 Nocturia   . Nocturnal leg cramps   . OA (osteoarthritis)    back,  left knee  . PONV (postoperative nausea and vomiting)   . PVC's (premature ventricular contractions)    hx of benign  . Restless legs syndrome (RLS)   . Thoracic ascending aortic aneurysm [facility])    followed by dr h. Tamala Julian--- per last CTA 02-21-2018 , 69mm    Past Surgical History:  Procedure Laterality Date  . BREAST LUMPECTOMY Right 1990s    "benign"  . CARDIAC CATHETERIZATION  09/10/2015   dr Bonnita Nasuti preston  @[facility]    normal coronaries and LVF,  false positive cardiolite  . COLONOSCOPY    . KNEE ARTHROSCOPY WITH MEDIAL MENISECTOMY Left 05/15/2018   Procedure: LEFT KNEE ARTHROSCOPY WITH MEDIAL MENISECTOMY;  Surgeon: Marchia Bond, MD;  Location: [facility];  Service: Orthopedics;  Laterality: Left;  . REPLACEMENT UNICONDYLAR JOINT KNEE Left 09/10/2015    dr Mardelle Matte  @[facility]   . TENDON RECONSTRUCTION Right 06/02/2016   Procedure: right elbow extensor tendon repair and debridement and repair lateral collateral ligament;  Surgeon: Ninetta Lights, MD;  Location: [place];  Service: Orthopedics;  Laterality: Right;  . TONSILLECTOMY  child  . TOTAL KNEE ARTHROPLASTY WITH REVISION COMPONENTS Left 05/15/2018   Procedure: conversion to LEFT total KNEE ARTHROPLASTY with hardware removal;  Surgeon: Marchia Bond, MD;  Location: [facility];  Service: Orthopedics;  Laterality: Left;  with block  . TRANSTHORACIC ECHOCARDIOGRAM  02/01/2017   mild LVH,  ef 50-35%, Grade 2 diastolic dysfucntion/  moderately thickened moderately calcified AV leafleat with mild stenosis and moderate regurg. (valve area 2.43cm^2)/  mild to moderate calcified MV annulus with triv. regurg/ trivial TR/  m;ild RAE  There were no vitals filed for this visit.  Subjective Assessment - 07/10/18 0814    Subjective  PT states that he did his exercises yesterday and he is sore today but not to bad.    Pertinent History  OA     Limitations  Standing;Walking    How long can you sit comfortably?  no problem     How long can you stand comfortably?  5-10 minutes     How long can you walk comfortably?  15 minutes with his cane     Patient Stated Goals  to walk without an assistive device, be able to walk and stand longer,     Currently in Pain?  Yes    Pain Score  8     Pain Location  Knee    Pain Orientation  Left    Pain Descriptors / Indicators  Sore    Pain Type   Chronic pain    Pain Onset  More than a month ago    Pain Frequency  Constant    Aggravating Factors   exercises     Pain Relieving Factors  meds, ice     Effect of Pain on Daily Activities  limits                        [facility] Adult PT Treatment/Exercise - 07/10/18 0001      Exercises   Exercises  Knee/Hip      Knee/Hip Exercises: Stretches   Active Hamstring Stretch  Left;3 reps;30 seconds    Passive Hamstring Stretch  Left;3 reps;30 seconds    Passive Hamstring Stretch Limitations  12" box    Quad Stretch  Left;3 reps;30 seconds    Knee: Self-Stretch to increase Flexion  Left;3 reps;30 seconds    Gastroc Stretch  Left;3 reps;30 seconds      Knee/Hip Exercises: Standing   Rocker Board  2 minutes    SLS with Vectors  10" x 3 B       Knee/Hip Exercises: Seated   Sit to [employer]  10 reps      Knee/Hip Exercises: Supine   Quad Sets  10 reps    Heel Slides  Left;5 reps    Terminal Knee Extension  Left;10 reps    Knee Extension  AROM    Knee Extension Limitations  8    Knee Flexion  AROM    Knee Flexion Limitations  114      Knee/Hip Exercises: Prone   Hamstring Curl  10 reps    Contract/Relax to Increase Flexion  5               PT Short Term Goals - 07/10/18 4888      PT SHORT TERM GOAL #1   Title  PT Lt knee flexion to be to 115 to allow pt to squat easily to pick items off of the floor.     Time  2    Period  Weeks    Status  On-going      PT SHORT TERM GOAL #2   Title  Pt Lt knee extension to be no greater than five degrees to improve pt gait.     Time  2    Period  Weeks    Status  On-going      PT SHORT TERM GOAL #3   Title  PT Lt LE strength to be increased by 1/2 grade to be able to come from sit  to stand from a low lying sofa without difficulty     Time  2    Period  Weeks        PT Long Term Goals - 07/10/18 0843      PT LONG TERM GOAL #1   Title  PT Lt knee flexion to be to 120 to allow pt fo be able to stand from the  ground to work in his yard.     Time  4    Period  Weeks    Status  On-going      PT LONG TERM GOAL #2   Title  Pt Lt knee extension to be 3 or less degrees to have a normal gait     Time  4    Period  Weeks    Status  On-going      PT LONG TERM GOAL #3   Title  PT Lt LE strength to be increased one grade to allow pt to go up and down 12 steps in a reciprocal manner without difficulty.     Time  4    Period  Weeks    Status  On-going            Plan - 07/10/18 1610    Clinical Impression Statement  Evaluation and goals reviewed with patient.  Began ROM focused treatment     Rehab Potential  Good    PT Frequency  3x / week   3x a wk for 2 weeks then 2x for 2 weeks    PT Duration  4 weeks    PT Treatment/Interventions  ADLs/Self Care Home Management;Stair training;Gait training;Therapeutic activities;Passive range of motion;Manual techniques;Therapeutic exercise;Balance training;Patient/family education    PT Next Visit Plan  ; progress with ROM and strength as able     PT Home Exercise Plan  supine hamstring stretch, single leg heelraise, sit to stand, towel extension.     Consulted and Agree with Plan of Care  Patient       Patient will benefit from skilled therapeutic intervention in order to improve the following deficits and impairments:  Abnormal gait, Decreased activity tolerance, Decreased balance, Decreased range of motion, Decreased endurance, Decreased strength, Pain, Impaired perceived functional ability  Visit Diagnosis: Stiffness of left knee, not elsewhere classified  Muscle weakness (generalized)     Problem List Patient Active Problem List   Diagnosis Date Noted  . Suspected infection of left prosthetic knee joint ([place]) 07/02/2018  . PICC (peripherally inserted central catheter) in place 07/02/2018  . Medication monitoring encounter 07/02/2018  . Failed partial knee, left ([place]) 05/15/2018  . Nocturnal leg cramps 09/26/2014  . Foot drop, right  09/24/2014  . 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 Upper airway cough syndrome 09/10/2014  . Aortic valve disease 11/19/2013  . Essential hypertension 11/19/2013  . Esophageal reflux 11/19/2013  . Gout 11/19/2013  . Aortic root enlargement [facility]) 11/19/2013  Rayetta Humphrey, [place] CLT [phone] 07/10/2018, 8:58 AM  [place] [address] [place], [place], [address] Phone: [phone]   Fax:  [phone]  Name: Marvin Munoz MRN: [id number] Date of Birth: 1960/09/07

## 2018-07-12 ENCOUNTER — Encounter (HOSPITAL_COMMUNITY): Payer: Self-pay

## 2018-07-12 ENCOUNTER — Ambulatory Visit (HOSPITAL_COMMUNITY): Payer: 59

## 2018-07-12 DIAGNOSIS — M25662 Stiffness of left knee, not elsewhere classified: Secondary | ICD-10-CM | POA: Diagnosis not present

## 2018-07-12 DIAGNOSIS — M6281 Muscle weakness (generalized): Secondary | ICD-10-CM

## 2018-07-12 NOTE — Therapy (Signed)
[place], [place], [address] Phone: [phone]   Fax:  [phone]  Physical Therapy Treatment  Patient Details  Name: Marvin Munoz MRN: [id number] Date of Birth: Mar 28, 1961 Referring Provider (PT): Marchia Bond   Encounter Date: 07/12/2018  PT End of Session - 07/12/18 1308    Visit Number  3    Number of Visits  10    Date for PT Re-Evaluation  08/08/18    Authorization Type  [employer]    Authorization Time Period  1/20-->08/06/2018    Authorization - Visit Number  3    Authorization - Number of Visits  60    PT Start Time  1300   2' on bike, not included wiht charges   PT Stop Time  1347    PT Time Calculation (min)  47 min    Activity Tolerance  Patient tolerated treatment well    Behavior During Therapy  [facility] for tasks assessed/performed       Past Medical History:  Diagnosis Date  . Allergic rhinitis   . Eczema   . Fever blister    occasional  . Foot drop, right 09/24/2014  . Gastroesophageal reflux   . Gout    05-09-2018 per pt last flare up, 2014  . Guillain Barr syndrome ([place]) 1970's   per pt received swine flu vaccination and regular flu vaccination at same time, was told one or the other may have caused guillain barre  . HTN (hypertension)   . Migraine headache   . Mild aortic valve stenosis cardiologist-  dr Daneen Schick   per last echo  02-01-2017   moderately thickended and calcificed leaflets with mild AS and mild AR,  valve area 2.43cm^2  . 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 Nocturia   . Nocturnal leg cramps   . OA (osteoarthritis)    back,  left knee  . PONV (postoperative nausea and vomiting)   . PVC's (premature ventricular contractions)    hx of benign  . Restless legs syndrome (RLS)   . Thoracic ascending aortic aneurysm [facility])    followed by dr h. Tamala Julian--- per  last CTA 02-21-2018 , 71mm    Past Surgical History:  Procedure Laterality Date  . BREAST LUMPECTOMY Right 1990s   "benign"  . CARDIAC CATHETERIZATION  09/10/2015   dr Bonnita Nasuti preston  @[facility]    normal coronaries and LVF,  false positive cardiolite  . COLONOSCOPY    . KNEE ARTHROSCOPY WITH MEDIAL MENISECTOMY Left 05/15/2018   Procedure: LEFT KNEE ARTHROSCOPY WITH MEDIAL MENISECTOMY;  Surgeon: Marchia Bond, MD;  [facility];  Service: Orthopedics;  Laterality: Left;  . REPLACEMENT UNICONDYLAR JOINT KNEE Left 09/10/2015    dr Mardelle Matte  @[facility]   . TENDON RECONSTRUCTION Right 06/02/2016   Procedure: right elbow extensor tendon repair and debridement and repair lateral collateral ligament;  Surgeon: Ninetta Lights, MD;  Location: [place];  Service: Orthopedics;  Laterality: Right;  . TONSILLECTOMY  child  . TOTAL KNEE ARTHROPLASTY WITH REVISION COMPONENTS Left 05/15/2018   Procedure: conversion to LEFT total KNEE ARTHROPLASTY with hardware removal;  Surgeon: Marchia Bond, MD;  [facility];  Service: Orthopedics;  Laterality: Left;  with block  . TRANSTHORACIC ECHOCARDIOGRAM  02/01/2017   mild LVH,  ef 38-93%, Grade 2 diastolic dysfucntion/  moderately thickened moderately calcified AV leafleat with  mild stenosis and moderate regurg. (valve area 2.43cm^2)/  mild to moderate calcified MV annulus with triv. regurg/ trivial TR/  m;ild RAE    There were no vitals filed for this visit.  Subjective Assessment - 07/12/18 1306    Subjective  Pt stated he feels he has began walking program, feels his Louie Casa is still down some due to medication and decreased activity tolerance.  Current pain scale 5/10 achey pain and tightness today.  Reports some difficulty with the sit to stand exercises.      Patient Stated Goals  to walk without an assistive device, be able to walk and stand longer,     Currently in Pain?  Yes    Pain Score  5     Pain Location  Knee    Pain Orientation   Left    Pain Descriptors / Indicators  Aching;Sore    Pain Type  Chronic pain    Pain Onset  More than a month ago    Pain Frequency  Constant    Aggravating Factors   exercises    Pain Relieving Factors  meds, ice    Effect of Pain on Daily Activities  limits                       [facility] Adult PT Treatment/Exercise - 07/12/18 0001      Knee/Hip Exercises: Stretches   Active Hamstring Stretch  Left;3 reps;30 seconds    Active Hamstring Stretch Limitations  12in step with slight over pressure    Quad Stretch  Left;3 reps;30 seconds    Quad Stretch Limitations  prone with rope    Knee: Self-Stretch to increase Flexion  Left;10 seconds    Knee: Self-Stretch Limitations  Knee drive 51ZG 01V 10" holds    Gastroc Stretch  Left;3 reps;30 seconds    Gastroc Stretch Limitations  slant board      Knee/Hip Exercises: Aerobic   Stationary Bike  2' full revolution seat 18 for mobility      Knee/Hip Exercises: Standing   Heel Raises  Both;10 reps    Heel Raises Limitations  Heel and toe raises on slope    Terminal Knee Extension  10 reps;Theraband    Theraband Level (Terminal Knee Extension)  Level 3 (Green)    Functional Squat  10 reps    Functional Squat Limitations  front of chair with cueing for mechanics    SLS with Vectors  10" x 3 B       Knee/Hip Exercises: Seated   Sit to Sand  10 reps;without UE support      Knee/Hip Exercises: Supine   Quad Sets  10 reps    Quad Sets Limitations  cueing for quad and reduce gluteal compensation    Short Arc Quad Sets  15 reps    Heel Slides  Left;5 reps    Terminal Knee Extension  Left;10 reps    Knee Extension  AROM    Knee Extension Limitations  6   was 8   Knee Flexion  AROM    Knee Flexion Limitations  118   was 114     Knee/Hip Exercises: Prone   Hamstring Curl  10 reps               PT Short Term Goals - 07/10/18 4944      PT SHORT TERM GOAL #1   Title  PT Lt knee flexion to be to 115 to allow pt  to  squat easily to pick items off of the floor.     Time  2    Period  Weeks    Status  On-going      PT SHORT TERM GOAL #2   Title  Pt Lt knee extension to be no greater than five degrees to improve pt gait.     Time  2    Period  Weeks    Status  On-going      PT SHORT TERM GOAL #3   Title  PT Lt LE strength to be increased by 1/2 grade to be able to come from sit to stand from a low lying sofa without difficulty     Time  2    Period  Weeks        PT Long Term Goals - 07/10/18 0843      PT LONG TERM GOAL #1   Title  PT Lt knee flexion to be to 120 to allow pt fo be able to stand from the ground to work in his yard.     Time  4    Period  Weeks    Status  On-going      PT LONG TERM GOAL #2   Title  Pt Lt knee extension to be 3 or less degrees to have a normal gait     Time  4    Period  Weeks    Status  On-going      PT LONG TERM GOAL #3   Title  PT Lt LE strength to be increased one grade to allow pt to go up and down 12 steps in a reciprocal manner without difficulty.     Time  4    Period  Weeks    Status  On-going            Plan - 07/12/18 1334    Clinical Impression Statement  Session focus with ROM wiht additional full revoluation of bike for mobility at beginning of session to address tightness, standing and supine TKE for knee extension and squats for gluteal strengthening to assist with sit to stands.  No reports of increased pain through session.  Improved AROM to 6-118 degrees at EOS.  Pt given standing hamstring stretch and TKE to HEP.      Rehab Potential  Good    PT Frequency  3x / week    PT Duration  4 weeks    PT Treatment/Interventions  ADLs/Self Care Home Management;Stair training;Gait training;Therapeutic activities;Passive range of motion;Manual techniques;Therapeutic exercise;Balance training;Patient/family education    PT Next Visit Plan  ; progress with ROM and strength as able.  Add prone TKE next session.    PT Home Exercise Plan  supine  hamstring stretch, single leg heelraise, sit to stand, towel extension; 1/23: standing hamstring stretch and TKE.       Patient will benefit from skilled therapeutic intervention in order to improve the following deficits and impairments:  Abnormal gait, Decreased activity tolerance, Decreased balance, Decreased range of motion, Decreased endurance, Decreased strength, Pain, Impaired perceived functional ability  Visit Diagnosis: Stiffness of left knee, not elsewhere classified  Muscle weakness (generalized)     Problem List Patient Active Problem List   Diagnosis Date Noted  . Suspected infection of left prosthetic knee joint ([place]) 07/02/2018  . PICC (peripherally inserted central catheter) in place 07/02/2018  . Medication monitoring encounter 07/02/2018  . Failed partial knee, left ([place]) 05/15/2018  . Nocturnal leg cramps 09/26/2014  .  Foot drop, right 09/24/2014  . 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 Upper airway cough syndrome 09/10/2014  . Aortic valve disease 11/19/2013  . Essential hypertension 11/19/2013  . Esophageal reflux 11/19/2013  . Gout 11/19/2013  . Aortic root enlargement [facility]) 11/19/2013   Ihor Austin, LPTA; [place]  Aldona Lento 07/12/2018, 1:52 PM  [place] [address] [place], [place], [address] Phone: [phone]   Fax:  [phone]  Name: RISHIKESH KHACHATRYAN MRN: [id number] Date of Birth: 1961-03-29

## 2018-07-12 NOTE — Patient Instructions (Signed)
Hamstring Stretch (Standing)    Standing, place one heel on chair or bench. Use one or both hands on thigh for support. Keeping torso straight, lean forward slowly until a stretch is felt in back of same thigh. Hold 30 seconds. Complete 3 sets.  Repeat with other leg.  Copyright  VHI. All rights reserved.   Knee Extension: Quad Set (Eccentric), (Resistance Band)    Stand holding support, tubing behind fully extended knee. Slowly bend knee for 3-5 seconds. Use green resistance band. 10 reps per set, 2 sets per day, 4 days per week.  http://ecce.exer.us/132   Copyright  VHI. All rights reserved.

## 2018-07-16 ENCOUNTER — Encounter (HOSPITAL_COMMUNITY): Payer: Self-pay

## 2018-07-16 ENCOUNTER — Ambulatory Visit (HOSPITAL_COMMUNITY): Payer: 59

## 2018-07-16 DIAGNOSIS — M25662 Stiffness of left knee, not elsewhere classified: Secondary | ICD-10-CM

## 2018-07-16 DIAGNOSIS — M6281 Muscle weakness (generalized): Secondary | ICD-10-CM

## 2018-07-16 NOTE — Therapy (Signed)
[place], [place], [address] Phone: [phone]   Fax:  [phone]  Physical Therapy Treatment  Patient Details  Name: Marvin Munoz MRN: [id number] Date of Birth: 1960/09/26 Referring Provider (PT): Marchia Bond   Encounter Date: 07/16/2018  PT End of Session - 07/16/18 0815    Visit Number  4    Number of Visits  10    Date for PT Re-Evaluation  08/08/18    Authorization Type  [employer]    Authorization Time Period  1/20-->08/06/2018    Authorization - Visit Number  4    Authorization - Number of Visits  60    PT Start Time  0813    PT Stop Time  5397    PT Time Calculation (min)  41 min    Activity Tolerance  Patient tolerated treatment well    Behavior During Therapy  [facility] for tasks assessed/performed       Past Medical History:  Diagnosis Date  . Allergic rhinitis   . Eczema   . Fever blister    occasional  . Foot drop, right 09/24/2014  . Gastroesophageal reflux   . Gout    05-09-2018 per pt last flare up, 2014  . Guillain Barr syndrome ([place]) 1970's   per pt received swine flu vaccination and regular flu vaccination at same time, was told one or the other may have caused guillain barre  . HTN (hypertension)   . Migraine headache   . Mild aortic valve stenosis cardiologist-  dr Daneen Schick   per last echo  02-01-2017   moderately thickended and calcificed leaflets with mild AS and mild AR,  valve area 2.43cm^2  . 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 Nocturia   . Nocturnal leg cramps   . OA (osteoarthritis)    back,  left knee  . PONV (postoperative nausea and vomiting)   . PVC's (premature ventricular contractions)    hx of benign  . Restless legs syndrome (RLS)   . Thoracic ascending aortic aneurysm [facility])    followed by dr h. Tamala Julian--- per last CTA 02-21-2018 , 63mm    Past  Surgical History:  Procedure Laterality Date  . BREAST LUMPECTOMY Right 1990s   "benign"  . CARDIAC CATHETERIZATION  09/10/2015   dr Bonnita Nasuti preston  @[facility]    normal coronaries and LVF,  false positive cardiolite  . COLONOSCOPY    . KNEE ARTHROSCOPY WITH MEDIAL MENISECTOMY Left 05/15/2018   Procedure: LEFT KNEE ARTHROSCOPY WITH MEDIAL MENISECTOMY;  Surgeon: Marchia Bond, MD;  Location: [facility];  Service: Orthopedics;  Laterality: Left;  . REPLACEMENT UNICONDYLAR JOINT KNEE Left 09/10/2015    dr Mardelle Matte  @[facility]   . TENDON RECONSTRUCTION Right 06/02/2016   Procedure: right elbow extensor tendon repair and debridement and repair lateral collateral ligament;  Surgeon: Ninetta Lights, MD;  Location: [facility];  Service: Orthopedics;  Laterality: Right;  . TONSILLECTOMY  child  . TOTAL KNEE ARTHROPLASTY WITH REVISION COMPONENTS Left 05/15/2018   Procedure: conversion to LEFT total KNEE ARTHROPLASTY with hardware removal;  Surgeon: Marchia Bond, MD;  Location: [facility];  Service: Orthopedics;  Laterality: Left;  with block  . TRANSTHORACIC ECHOCARDIOGRAM  02/01/2017   mild LVH,  ef 67-34%, Grade 2 diastolic dysfucntion/  moderately thickened moderately calcified AV leafleat with mild stenosis and moderate regurg. (valve area 2.43cm^2)/  mild to moderate calcified MV annulus with triv. regurg/ trivial TR/  m;ild RAE    There were no vitals filed for this visit.  Subjective Assessment - 07/16/18 0816    Subjective  Pt stating that he is doing alright. He stepped wrong this weekend and tweaked his knee a little bit but not too bad. Overall, he is getting better.     Patient Stated Goals  to walk without an assistive device, be able to walk and stand longer,     Currently in Pain?  Yes    Pain Score  3     Pain Location  Knee    Pain Orientation  Left    Pain Descriptors / Indicators  Aching;Sore    Pain Type  Chronic pain    Pain Onset  More than a month ago    Pain Frequency   Constant    Aggravating Factors   exercises    Pain Relieving Factors  meds, ice    Effect of Pain on Daily Activities  limits            [facility] Adult PT Treatment/Exercise - 07/16/18 0001      Knee/Hip Exercises: Stretches   Active Hamstring Stretch  Left;3 reps;30 seconds    Active Hamstring Stretch Limitations  12in step with slight over pressure    Quad Stretch  Left;3 reps;30 seconds    Quad Stretch Limitations  prone with rope    Knee: Self-Stretch to increase Flexion  Left;10 seconds    Knee: Self-Stretch Limitations  Knee drive [id number] 10" holds    Gastroc Stretch  Left;3 reps;30 seconds    Gastroc Stretch Limitations  slant board      Knee/Hip Exercises: Standing   Heel Raises  Both;15 reps    Heel Raises Limitations  Heel and toe raises on slope    Terminal Knee Extension  10 reps;Theraband    Theraband Level (Terminal Knee Extension)  Level 3 (Green)    Terminal Knee Extension Limitations  5" holds    Lateral Step Up  Left;10 reps;Step Height: 4"    Lateral Step Up Limitations  cues for TKE at top    Forward Step Up  Left;10 reps;Step Height: 4"    Step Down  Left;10 reps;Step Height: 4";Hand Hold: 2    Functional Squat  2 sets;10 reps    Functional Squat Limitations  tactile cues for proper form; chair behind for form      Knee/Hip Exercises: Supine   Knee Extension Limitations  5    Knee Flexion Limitations  115      Manual Therapy   Manual Therapy  Joint mobilization;Soft tissue mobilization    Manual therapy comments  separate rest of treatment    Joint Mobilization  patellar mobs all directions, AP/PA tibiofemoral joint mobs for knee extension and flexion ROM    Soft tissue mobilization  STM to distal quad to reduce pain and restrictions             PT Education - 07/16/18 0815    Education Details  exercise technique, continue HEP    Person(s) Educated  Patient    Methods  Explanation;Demonstration    Comprehension  Verbalized  understanding;Returned demonstration       PT Short Term Goals - 07/10/18 0842      PT SHORT TERM GOAL #1   Title  PT Lt knee flexion to be to 115 to allow pt to squat easily to pick items  off of the floor.     Time  2    Period  Weeks    Status  On-going      PT SHORT TERM GOAL #2   Title  Pt Lt knee extension to be no greater than five degrees to improve pt gait.     Time  2    Period  Weeks    Status  On-going      PT SHORT TERM GOAL #3   Title  PT Lt LE strength to be increased by 1/2 grade to be able to come from sit to stand from a low lying sofa without difficulty     Time  2    Period  Weeks        PT Long Term Goals - 07/10/18 0843      PT LONG TERM GOAL #1   Title  PT Lt knee flexion to be to 120 to allow pt fo be able to stand from the ground to work in his yard.     Time  4    Period  Weeks    Status  On-going      PT LONG TERM GOAL #2   Title  Pt Lt knee extension to be 3 or less degrees to have a normal gait     Time  4    Period  Weeks    Status  On-going      PT LONG TERM GOAL #3   Title  PT Lt LE strength to be increased one grade to allow pt to go up and down 12 steps in a reciprocal manner without difficulty.     Time  4    Period  Weeks    Status  On-going            Plan - 07/16/18 5400    Clinical Impression Statement  Continued with established POC focusing on improving overall ROM and strengthening of LLE. Added fwd and lat step ups for quad strengthening for improved extension ROM, as well as step downs for eccentric quad strength. Min cues for form throughout all of therex, but no reports of increased pain. Ended with manual for joint mobs and soft tissue restrictions for improved mobility. AROM 5 to 115deg this date. Continue as planned, progressing as able.    Rehab Potential  Good    PT Frequency  3x / week    PT Duration  4 weeks    PT Treatment/Interventions  ADLs/Self Care Home Management;Stair training;Gait  training;Therapeutic activities;Passive range of motion;Manual techniques;Therapeutic exercise;Balance training;Patient/family education    PT Next Visit Plan  continue progress with ROM and strength as able.  Add prone TKE next session.    PT Home Exercise Plan  supine hamstring stretch, single leg heelraise, sit to stand, towel extension; 1/23: standing hamstring stretch and TKE.    Consulted and Agree with Plan of Care  Patient       Patient will benefit from skilled therapeutic intervention in order to improve the following deficits and impairments:  Abnormal gait, Decreased activity tolerance, Decreased balance, Decreased range of motion, Decreased endurance, Decreased strength, Pain, Impaired perceived functional ability  Visit Diagnosis: Stiffness of left knee, not elsewhere classified  Muscle weakness (generalized)     Problem List Patient Active Problem List   Diagnosis Date Noted  . Suspected infection of left prosthetic knee joint ([place]) 07/02/2018  . PICC (peripherally inserted central catheter) in place 07/02/2018  . Medication monitoring encounter 07/02/2018  .  Failed partial knee, left ([place]) 05/15/2018  . Nocturnal leg cramps 09/26/2014  . Foot drop, right 09/24/2014  . 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 Upper airway cough syndrome 09/10/2014  . Aortic valve disease 11/19/2013  . Essential hypertension 11/19/2013  . Esophageal reflux 11/19/2013  . Gout 11/19/2013  . Aortic root enlargement ([place]) 11/19/2013     Marvin Munoz PT, DPT  [facility] [address] [place], [place], [address] Phone: [phone]   Fax:  [phone]  Name: Marvin Munoz MRN: [id number] Date of Birth: Jul 17, 1960

## 2018-07-18 ENCOUNTER — Encounter (HOSPITAL_COMMUNITY): Payer: Self-pay

## 2018-07-18 ENCOUNTER — Ambulatory Visit (HOSPITAL_COMMUNITY): Payer: 59

## 2018-07-18 DIAGNOSIS — M25662 Stiffness of left knee, not elsewhere classified: Secondary | ICD-10-CM

## 2018-07-18 DIAGNOSIS — M6281 Muscle weakness (generalized): Secondary | ICD-10-CM

## 2018-07-18 NOTE — Therapy (Signed)
[place], [place], [address] Phone: [phone]   Fax:  [phone]  Physical Therapy Treatment  Patient Details  Name: Marvin Munoz MRN: [id number] Date of Birth: 1960-10-26 Referring Provider (PT): Marchia Bond   Encounter Date: 07/18/2018  PT End of Session - 07/18/18 0820    Visit Number  5    Number of Visits  10    Date for PT Re-Evaluation  08/08/18    Authorization Type  UHC    Authorization Time Period  1/20-->08/06/2018    Authorization - Visit Number  5    Authorization - Number of Visits  60    PT Start Time  [phone]   3' on bike, not included with charges   PT Stop Time  0858    PT Time Calculation (min)  44 min    Activity Tolerance  Patient tolerated treatment well    Behavior During Therapy  [facility] for tasks assessed/performed       Past Medical History:  Diagnosis Date  . Allergic rhinitis   . Eczema   . Fever blister    occasional  . Foot drop, right 09/24/2014  . Gastroesophageal reflux   . Gout    05-09-2018 per pt last flare up, 2014  . Guillain Barr syndrome ([place]) 1970's   per pt received swine flu vaccination and regular flu vaccination at same time, was told one or the other may have caused guillain barre  . HTN (hypertension)   . Migraine headache   . Mild aortic valve stenosis cardiologist-  dr Daneen Schick   per last echo  02-01-2017   moderately thickended and calcificed leaflets with mild AS and mild AR,  valve area 2.43cm^2  . 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 Nocturia   . Nocturnal leg cramps   . OA (osteoarthritis)    back,  left knee  . PONV (postoperative nausea and vomiting)   . PVC's (premature ventricular contractions)    hx of benign  . Restless legs syndrome (RLS)   . Thoracic ascending aortic aneurysm [facility])    followed by dr h. Tamala Julian--- per  last CTA 02-21-2018 , 25mm    Past Surgical History:  Procedure Laterality Date  . BREAST LUMPECTOMY Right 1990s   "benign"  . CARDIAC CATHETERIZATION  09/10/2015   dr Bonnita Nasuti preston  @[facility]    normal coronaries and LVF,  false positive cardiolite  . COLONOSCOPY    . KNEE ARTHROSCOPY WITH MEDIAL MENISECTOMY Left 05/15/2018   Procedure: LEFT KNEE ARTHROSCOPY WITH MEDIAL MENISECTOMY;  Surgeon: Marchia Bond, MD;  [facility];  Service: Orthopedics;  Laterality: Left;  . REPLACEMENT UNICONDYLAR JOINT KNEE Left 09/10/2015    dr Mardelle Matte  @[facility]   . TENDON RECONSTRUCTION Right 06/02/2016   Procedure: right elbow extensor tendon repair and debridement and repair lateral collateral ligament;  Surgeon: Ninetta Lights, MD;  Location: [facility];  Service: Orthopedics;  Laterality: Right;  . TONSILLECTOMY  child  . TOTAL KNEE ARTHROPLASTY WITH REVISION COMPONENTS Left 05/15/2018   Procedure: conversion to LEFT total KNEE ARTHROPLASTY with hardware removal;  Surgeon: Marchia Bond, MD;  [facility];  Service: Orthopedics;  Laterality: Left;  with block  . TRANSTHORACIC ECHOCARDIOGRAM  02/01/2017   mild LVH,  ef 17-00%, Grade 2 diastolic dysfucntion/  moderately thickened moderately calcified AV leafleat with  mild stenosis and moderate regurg. (valve area 2.43cm^2)/  mild to moderate calcified MV annulus with triv. regurg/ trivial TR/  m;ild RAE    There were no vitals filed for this visit.  Subjective Assessment - 07/18/18 0817    Subjective  Pt stated mornings are rough and with the cold weather pain scale 7/10 today, stiff and sore/achey pain.  Reports trying to reduce pain medication    Patient Stated Goals  to walk without an assistive device, be able to walk and stand longer,     Currently in Pain?  Yes    Pain Score  7     Pain Location  Knee    Pain Orientation  Left    Pain Descriptors / Indicators  Aching;Sore;Tightness    Pain Type  Chronic pain    Pain Onset  More  than a month ago    Pain Frequency  Constant    Aggravating Factors   exercises    Pain Relieving Factors  meds, ice    Effect of Pain on Daily Activities  limits                       [facility] Adult PT Treatment/Exercise - 07/18/18 0001      Knee/Hip Exercises: Stretches   Active Hamstring Stretch  Left;3 reps;30 seconds    Active Hamstring Stretch Limitations  12in step with slight over pressure    Quad Stretch  Left;3 reps;30 seconds    Quad Stretch Limitations  prone with rope    Knee: Self-Stretch to increase Flexion  Left;10 seconds    Knee: Self-Stretch Limitations  Knee drive [id number] [id number] 10" holds    Gastroc Stretch  Left;3 reps;30 seconds    Gastroc Stretch Limitations  slant board      Knee/Hip Exercises: Aerobic   Stationary Bike  2' full revolution seat 15 for mobility      Knee/Hip Exercises: Machines for Strengthening   Total Gym Leg Press  [employer] leg press 10x 4Pl 40#      Knee/Hip Exercises: Standing   Heel Raises  Both;15 reps    Heel Raises Limitations  Heel and toe raises on slope    Terminal Knee Extension  15 reps;Theraband    Theraband Level (Terminal Knee Extension)  Level 3 (Green)    Terminal Knee Extension Limitations  5" holds    Lateral Step Up  Left;15 reps;Hand Hold: 2;Step Height: 4"    Lateral Step Up Limitations  cues for TKE at top    Forward Step Up  Left;15 reps;Hand Hold: 0;Step Height: 6"    Step Down  Left;10 reps;Step Height: 4";Hand Hold: 2    Step Down Limitations  heel taps for eccentric control    Functional Squat  15 reps    Functional Squat Limitations  chair     SLS with Vectors  10" x 3 B       Knee/Hip Exercises: Supine   Knee Extension  AROM    Knee Extension Limitations  5   was 5   Knee Flexion  AROM    Knee Flexion Limitations  117   was 115     Knee/Hip Exercises: Prone   Other Prone Exercises  TKE 10x 5"      Manual Therapy   Manual Therapy  Joint mobilization;Soft tissue mobilization    Manual  therapy comments  separate rest of treatment    Joint Mobilization  patellar mobs all directions, AP/PA tibiofemoral  joint mobs for knee extension and flexion ROM    Soft tissue mobilization  STM to distal quad to reduce pain and restrictions               PT Short Term Goals - 07/10/18 6063      PT SHORT TERM GOAL #1   Title  PT Lt knee flexion to be to 115 to allow pt to squat easily to pick items off of the floor.     Time  2    Period  Weeks    Status  On-going      PT SHORT TERM GOAL #2   Title  Pt Lt knee extension to be no greater than five degrees to improve pt gait.     Time  2    Period  Weeks    Status  On-going      PT SHORT TERM GOAL #3   Title  PT Lt LE strength to be increased by 1/2 grade to be able to come from sit to stand from a low lying sofa without difficulty     Time  2    Period  Weeks        PT Long Term Goals - 07/10/18 0843      PT LONG TERM GOAL #1   Title  PT Lt knee flexion to be to 120 to allow pt fo be able to stand from the ground to work in his yard.     Time  4    Period  Weeks    Status  On-going      PT LONG TERM GOAL #2   Title  Pt Lt knee extension to be 3 or less degrees to have a normal gait     Time  4    Period  Weeks    Status  On-going      PT LONG TERM GOAL #3   Title  PT Lt LE strength to be increased one grade to allow pt to go up and down 12 steps in a reciprocal manner without difficulty.     Time  4    Period  Weeks    Status  On-going            Plan - 07/18/18 1234    Clinical Impression Statement  Continued with established POC focusing on knee mobility and functional strengthening.  Pt progressing well with good mechanics for majority of exercises.  Able to increased step up height with good quad control noted.  Most difficulty with eccentric quad control with step downs this session.  Added prone TKE to address extension lag.  EOS with manual for joint mobility.  AROM 5-118 degrees (was 5-115  degrees last session.)    Rehab Potential  Good    PT Frequency  3x / week    PT Duration  4 weeks    PT Treatment/Interventions  ADLs/Self Care Home Management;Stair training;Gait training;Therapeutic activities;Passive range of motion;Manual techniques;Therapeutic exercise;Balance training;Patient/family education    PT Next Visit Plan  continue progress with ROM and strength as able.    PT Home Exercise Plan  supine hamstring stretch, single leg heelraise, sit to stand, towel extension; 1/23: standing hamstring stretch and TKE.       Patient will benefit from skilled therapeutic intervention in order to improve the following deficits and impairments:  Abnormal gait, Decreased activity tolerance, Decreased balance, Decreased range of motion, Decreased endurance, Decreased strength, Pain, Impaired perceived functional ability  Visit  Diagnosis: Stiffness of left knee, not elsewhere classified  Muscle weakness (generalized)     Problem List Patient Active Problem List   Diagnosis Date Noted  . Suspected infection of left prosthetic knee joint ([place]) 07/02/2018  . PICC (peripherally inserted central catheter) in place 07/02/2018  . Medication monitoring encounter 07/02/2018  . Failed partial knee, left ([place]) 05/15/2018  . Nocturnal leg cramps 09/26/2014  . Foot drop, right 09/24/2014  . 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 Upper airway cough syndrome 09/10/2014  . Aortic valve disease 11/19/2013  . Essential hypertension 11/19/2013  . Esophageal reflux 11/19/2013  . Gout 11/19/2013  . Aortic root enlargement [facility]) 11/19/2013   Ihor Austin, LPTA; [place]  Aldona Lento 07/18/2018, 12:56 PM  [facility] [address] [place], [place], [address] Phone: [phone]   Fax:  [phone]  Name: FRANDY BASNETT MRN: [id number] Date of Birth: February 08, 1961

## 2018-07-19 ENCOUNTER — Encounter (HOSPITAL_COMMUNITY): Payer: 59

## 2018-07-20 ENCOUNTER — Ambulatory Visit (HOSPITAL_COMMUNITY): Payer: 59

## 2018-07-20 ENCOUNTER — Encounter (HOSPITAL_COMMUNITY): Payer: Self-pay

## 2018-07-20 DIAGNOSIS — M25662 Stiffness of left knee, not elsewhere classified: Secondary | ICD-10-CM

## 2018-07-20 DIAGNOSIS — M6281 Muscle weakness (generalized): Secondary | ICD-10-CM

## 2018-07-20 NOTE — Therapy (Signed)
[place], [place], [address] Phone: [phone]   Fax:  [phone]  Physical Therapy Treatment  Patient Details  Name: Marvin Munoz MRN: [id number] Date of Birth: Dec 25, 1960 Referring Provider (PT): Marchia Bond   Encounter Date: 07/20/2018  PT End of Session - 07/20/18 0817    Visit Number  6    Number of Visits  10    Date for PT Re-Evaluation  08/08/18    Authorization Type  [employer]    Authorization Time Period  1/20-->08/06/2018    Authorization - Visit Number  6    Authorization - Number of Visits  60    PT Start Time  0815    PT Stop Time  0900    PT Time Calculation (min)  45 min    Activity Tolerance  Patient tolerated treatment well    Behavior During Therapy  [facility] for tasks assessed/performed       Past Medical History:  Diagnosis Date  . Allergic rhinitis   . Eczema   . Fever blister    occasional  . Foot drop, right 09/24/2014  . Gastroesophageal reflux   . Gout    05-09-2018 per pt last flare up, 2014  . Guillain Barr syndrome ([place]) 1970's   per pt received swine flu vaccination and regular flu vaccination at same time, was told one or the other may have caused guillain barre  . HTN (hypertension)   . Migraine headache   . Mild aortic valve stenosis cardiologist-  dr Daneen Schick   per last echo  02-01-2017   moderately thickended and calcificed leaflets with mild AS and mild AR,  valve area 2.43cm^2  . 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 Nocturia   . Nocturnal leg cramps   . OA (osteoarthritis)    back,  left knee  . PONV (postoperative nausea and vomiting)   . PVC's (premature ventricular contractions)    hx of benign  . Restless legs syndrome (RLS)   . Thoracic ascending aortic aneurysm [facility])    followed by dr h. Tamala Julian--- per last CTA 02-21-2018 , 27mm    Past  Surgical History:  Procedure Laterality Date  . BREAST LUMPECTOMY Right 1990s   "benign"  . CARDIAC CATHETERIZATION  09/10/2015   dr Bonnita Nasuti preston  @[facility]    normal coronaries and LVF,  false positive cardiolite  . COLONOSCOPY    . KNEE ARTHROSCOPY WITH MEDIAL MENISECTOMY Left 05/15/2018   Procedure: LEFT KNEE ARTHROSCOPY WITH MEDIAL MENISECTOMY;  Surgeon: Marchia Bond, MD;  Location: [facility];  Service: Orthopedics;  Laterality: Left;  . REPLACEMENT UNICONDYLAR JOINT KNEE Left 09/10/2015    dr Mardelle Matte  @[facility]   . TENDON RECONSTRUCTION Right 06/02/2016   Procedure: right elbow extensor tendon repair and debridement and repair lateral collateral ligament;  Surgeon: Ninetta Lights, MD;  Location: [place];  Service: Orthopedics;  Laterality: Right;  . TONSILLECTOMY  child  . TOTAL KNEE ARTHROPLASTY WITH REVISION COMPONENTS Left 05/15/2018   Procedure: conversion to LEFT total KNEE ARTHROPLASTY with hardware removal;  Surgeon: Marchia Bond, MD;  Location: [facility];  Service: Orthopedics;  Laterality: Left;  with block  . TRANSTHORACIC ECHOCARDIOGRAM  02/01/2017   mild LVH,  ef 37-90%, Grade 2 diastolic dysfucntion/  moderately thickened moderately calcified AV leafleat with mild stenosis and moderate regurg. (valve area 2.43cm^2)/  mild to moderate calcified MV annulus with triv. regurg/ trivial TR/  m;ild RAE    There were no vitals filed for this visit.  Subjective Assessment - 07/20/18 0817    Subjective  Pt reports that he got on his bike this morning to loosen his knee up which helped. He's still having some lateral knee pain.     Patient Stated Goals  to walk without an assistive device, be able to walk and stand longer,     Currently in Pain?  Yes    Pain Score  7     Pain Location  Knee    Pain Orientation  Left    Pain Descriptors / Indicators  Aching;Sore    Pain Type  Chronic pain    Pain Onset  More than a month ago    Pain Frequency  Constant    Aggravating  Factors   exercises    Pain Relieving Factors  meds, ice    Effect of Pain on Daily Activities  limits          [facility] Adult PT Treatment/Exercise - 07/20/18 0001      Knee/Hip Exercises: Stretches   Active Hamstring Stretch  Left;3 reps;30 seconds    Active Hamstring Stretch Limitations  12in step with slight over pressure    Knee: Self-Stretch to increase Flexion  Left;10 seconds    Knee: Self-Stretch Limitations  Knee drive 97DZ 32D 10" holds    Gastroc Stretch  Left;3 reps;30 seconds    Gastroc Stretch Limitations  slant board      Knee/Hip Exercises: Standing   Heel Raises  Both;15 reps    Heel Raises Limitations  Heel and toe raises on slope    Knee Flexion  --    Hip Abduction  Both;2 sets;10 reps    Abduction Limitations  RTB    Lateral Step Up  Left;10 reps;Step Height: 6"    Forward Step Up  Left;10 reps;Step Height: 6"    Step Down  Left;10 reps;Step Height: 6"    Other Standing Knee Exercises  sidestepping RTB 22ft x2RT      Knee/Hip Exercises: Supine   Knee Extension Limitations  3    Knee Flexion Limitations  113      Knee/Hip Exercises: Prone   Other Prone Exercises  TKE 10x 5"      Manual Therapy   Manual Therapy  Soft tissue mobilization    Manual therapy comments  separate rest of treatment    Soft tissue mobilization  STM to lateral HS along distal mm belly and tendon all the way to insertion; light and moderate STM at lateral joint line for pain control             PT Education - 07/20/18 0817    Education Details  exercise technique, continue HEP    Person(s) Educated  Patient    Methods  Explanation;Demonstration    Comprehension  Verbalized understanding;Returned demonstration       PT Short Term Goals - 07/10/18 0842      PT SHORT TERM GOAL #1   Title  PT Lt knee flexion to be to 115 to allow pt to squat easily to pick items off of the floor.     Time  2    Period  Weeks    Status  On-going      PT SHORT TERM GOAL #2   Title  Pt  Lt knee extension to be no greater than five degrees  to improve pt gait.     Time  2    Period  Weeks    Status  On-going      PT SHORT TERM GOAL #3   Title  PT Lt LE strength to be increased by 1/2 grade to be able to come from sit to stand from a low lying sofa without difficulty     Time  2    Period  Weeks        PT Long Term Goals - 07/10/18 0843      PT LONG TERM GOAL #1   Title  PT Lt knee flexion to be to 120 to allow pt fo be able to stand from the ground to work in his yard.     Time  4    Period  Weeks    Status  On-going      PT LONG TERM GOAL #2   Title  Pt Lt knee extension to be 3 or less degrees to have a normal gait     Time  4    Period  Weeks    Status  On-going      PT LONG TERM GOAL #3   Title  PT Lt LE strength to be increased one grade to allow pt to go up and down 12 steps in a reciprocal manner without difficulty.     Time  4    Period  Weeks    Status  On-going            Plan - 07/20/18 0915    Clinical Impression Statement  Continued with established POC focusing on improving last bit of knee extension ROM, strengthening, and addressing L knee pain. Pt tolerating therex well, only reporting some minor discomfort at lateral joint line, but it did not limit him from participating in exercises. Increased step height for step ups and downs. Assessed pt's c/o pain at L knee and noted increased restrictions/spasm at lateral HS along distal mm belly and this recreated his same pain. Pt also tender to lateral joint line. Educated pt, feel his pain is likely from tight lateral HS and then the lateral joint healing and her verbalized understanding. Educated him to perform ice massage to area in order to address his pain and he verbalized understanding. AROM 3 to 113deg this date. Continue as planned, progressing as able, and continue to address lateral HS tightness and lateral joint line pain.     Rehab Potential  Good    PT Frequency  3x / week    PT  Duration  4 weeks    PT Treatment/Interventions  ADLs/Self Care Home Management;Stair training;Gait training;Therapeutic activities;Passive range of motion;Manual techniques;Therapeutic exercise;Balance training;Patient/family education    PT Next Visit Plan  continue STM lateral HS and lateral joint line, f/u on ice massage; continue progress with ROM and strength as able.    PT Home Exercise Plan  supine hamstring stretch, single leg heelraise, sit to stand, towel extension; 1/23: standing hamstring stretch and TKE.    Consulted and Agree with Plan of Care  Patient       Patient will benefit from skilled therapeutic intervention in order to improve the following deficits and impairments:  Abnormal gait, Decreased activity tolerance, Decreased balance, Decreased range of motion, Decreased endurance, Decreased strength, Pain, Impaired perceived functional ability  Visit Diagnosis: Stiffness of left knee, not elsewhere classified  Muscle weakness (generalized)     Problem List Patient Active Problem List   Diagnosis  Date Noted  . Suspected infection of left prosthetic knee joint ([place]) 07/02/2018  . PICC (peripherally inserted central catheter) in place 07/02/2018  . Medication monitoring encounter 07/02/2018  . Failed partial knee, left ([place]) 05/15/2018  . Nocturnal leg cramps 09/26/2014  . Foot drop, right 09/24/2014  . 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 Upper airway cough syndrome 09/10/2014  . Aortic valve disease 11/19/2013  . Essential hypertension 11/19/2013  . Esophageal reflux 11/19/2013  . Gout 11/19/2013  . Aortic root enlargement ([place]) 11/19/2013        Geraldine Solar PT, DPT  [place] [address] [place], [place], [address] Phone: [phone]   Fax:  [phone]  Name: Marvin Munoz MRN: [id number] Date of Birth: [birth year]

## 2018-07-24 ENCOUNTER — Encounter (HOSPITAL_COMMUNITY): Payer: Self-pay

## 2018-07-24 ENCOUNTER — Ambulatory Visit (HOSPITAL_COMMUNITY): Payer: 59 | Attending: Internal Medicine

## 2018-07-24 DIAGNOSIS — M6281 Muscle weakness (generalized): Secondary | ICD-10-CM

## 2018-07-24 DIAGNOSIS — M25662 Stiffness of left knee, not elsewhere classified: Secondary | ICD-10-CM | POA: Insufficient documentation

## 2018-07-24 NOTE — Therapy (Signed)
[place], [place], [address] Phone: [phone]   Fax:  [phone]  Physical Therapy Treatment  Patient Details  Name: Marvin Munoz MRN: [id number] Date of Birth: 06-30-1960 Referring Provider (PT): Marchia Bond   Encounter Date: 07/24/2018  PT End of Session - 07/24/18 0818    Visit Number  7    Number of Visits  10    Date for PT Re-Evaluation  08/08/18    Authorization Type  UHC    Authorization Time Period  1/20-->08/06/2018    Authorization - Visit Number  7    Authorization - Number of Visits  60    PT Start Time  0815    PT Stop Time  0859    PT Time Calculation (min)  44 min    Activity Tolerance  Patient tolerated treatment well    Behavior During Therapy  [facility] for tasks assessed/performed       Past Medical History:  Diagnosis Date  . Allergic rhinitis   . Eczema   . Fever blister    occasional  . Foot drop, right 09/24/2014  . Gastroesophageal reflux   . Gout    05-09-2018 per pt last flare up, 2014  . Guillain Barr syndrome ([place]) 1970's   per pt received swine flu vaccination and regular flu vaccination at same time, was told one or the other may have caused guillain barre  . HTN (hypertension)   . Migraine headache   . Mild aortic valve stenosis cardiologist-  dr Daneen Schick   per last echo  02-01-2017   moderately thickended and calcificed leaflets with mild AS and mild AR,  valve area 2.43cm^2  . 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 Nocturia   . Nocturnal leg cramps   . OA (osteoarthritis)    back,  left knee  . PONV (postoperative nausea and vomiting)   . PVC's (premature ventricular contractions)    hx of benign  . Restless legs syndrome (RLS)   . Thoracic ascending aortic aneurysm [facility])    followed by dr h. Tamala Julian--- per last CTA 02-21-2018 , 76mm    Past  Surgical History:  Procedure Laterality Date  . BREAST LUMPECTOMY Right 1990s   "benign"  . CARDIAC CATHETERIZATION  09/10/2015   dr Bonnita Nasuti preston  @[facility]    normal coronaries and LVF,  false positive cardiolite  . COLONOSCOPY    . KNEE ARTHROSCOPY WITH MEDIAL MENISECTOMY Left 05/15/2018   Procedure: LEFT KNEE ARTHROSCOPY WITH MEDIAL MENISECTOMY;  Surgeon: Marchia Bond, MD;  [facility];  Service: Orthopedics;  Laterality: Left;  . REPLACEMENT UNICONDYLAR JOINT KNEE Left 09/10/2015    dr Mardelle Matte  @[facility]   . TENDON RECONSTRUCTION Right 06/02/2016   Procedure: right elbow extensor tendon repair and debridement and repair lateral collateral ligament;  Surgeon: Ninetta Lights, MD;  Location: [place];  Service: Orthopedics;  Laterality: Right;  . TONSILLECTOMY  child  . TOTAL KNEE ARTHROPLASTY WITH REVISION COMPONENTS Left 05/15/2018   Procedure: conversion to LEFT total KNEE ARTHROPLASTY with hardware removal;  Surgeon: Marchia Bond, MD;  [facility];  Service: Orthopedics;  Laterality: Left;  with block  . TRANSTHORACIC ECHOCARDIOGRAM  02/01/2017   mild LVH,  ef 32-67%, Grade 2 diastolic dysfucntion/  moderately thickened moderately calcified AV leafleat with mild stenosis and moderate regurg. (valve area 2.43cm^2)/  mild to moderate calcified MV annulus with triv. regurg/ trivial TR/  m;ild RAE    There were no vitals filed for this visit.  Subjective Assessment - 07/24/18 0817    Subjective  Pt stated he continues to have pain on lateral aspect of knee following exercises or gait.  Reports he began the day on bike to help loosen up.  Reports he has began the ice massage on knee.    Pertinent History  OA     Patient Stated Goals  to walk without an assistive device, be able to walk and stand longer,     Currently in Pain?  Yes    Pain Score  4     Pain Location  Knee    Pain Orientation  Left;Lateral    Pain Descriptors / Indicators  Aching;Sore    Pain Type   Chronic pain    Pain Onset  More than a month ago    Pain Frequency  Constant    Aggravating Factors   exercises    Pain Relieving Factors  meds, ice    Effect of Pain on Daily Activities  limits         [facility] PT Assessment - 07/24/18 0001      Assessment   Medical Diagnosis  Lt TKR    Referring Provider (PT)  Marchia Bond    Onset Date/Surgical Date  05/16/18    Hand Dominance  Right    Next MD Visit  08/03/2018    Prior Therapy  HH      Precautions   Precautions  None                   [facility] Adult PT Treatment/Exercise - 07/24/18 0001      Knee/Hip Exercises: Stretches   Active Hamstring Stretch  Left;3 reps;30 seconds    Active Hamstring Stretch Limitations  12in step with slight over pressure    Knee: Self-Stretch to increase Flexion  Left;5 reps;10 seconds    Knee: Self-Stretch Limitations  Knee drive 75ZW 10" holds    Gastroc Stretch  Left;3 reps;30 seconds    Gastroc Stretch Limitations  slant board      Knee/Hip Exercises: Machines for Strengthening   Total Gym Leg Press  [employer] leg press 10x 4Pl 40#      Knee/Hip Exercises: Standing   Heel Raises  Right;Left;15 reps   Heel raises complete SLS with HHA; toe raises on slope   Heel Raises Limitations  Heel raises complete SLS with HHA; toe raises on slope    Hip Abduction  Both;2 sets;10 reps    Abduction Limitations  RTB    Lateral Step Up  Left;Hand Hold: 0;Step Height: 6"    Lateral Step Up Limitations  heel strike    Forward Step Up  Left;15 reps;Hand Hold: 0;Step Height: 6"    Step Down  Left;10 reps;Step Height: 6"    SLS with Vectors  10" x 3 B     Other Standing Knee Exercises  sidestepping RTB 42ft x2RT      Knee/Hip Exercises: Supine   Knee Extension  AROM    Knee Extension Limitations  3    Knee Flexion  AROM    Knee Flexion Limitations  113      Knee/Hip Exercises: Prone   Contract/Relax to Increase Flexion  5    Other Prone Exercises  TKE 10x 5"      Manual Therapy    Manual Therapy  Soft  tissue mobilization    Manual therapy comments  separate rest of treatment    Joint Mobilization  patellar mobs all directions, AP/PA tibiofemoral joint mobs for knee extension and flexion ROM    Soft tissue mobilization  STM to lateral HS along distal mm belly and tendon all the way to insertion; light and moderate STM at lateral joint line for pain control               PT Short Term Goals - 07/10/18 0842      PT SHORT TERM GOAL #1   Title  PT Lt knee flexion to be to 115 to allow pt to squat easily to pick items off of the floor.     Time  2    Period  Weeks    Status  On-going      PT SHORT TERM GOAL #2   Title  Pt Lt knee extension to be no greater than five degrees to improve pt gait.     Time  2    Period  Weeks    Status  On-going      PT SHORT TERM GOAL #3   Title  PT Lt LE strength to be increased by 1/2 grade to be able to come from sit to stand from a low lying sofa without difficulty     Time  2    Period  Weeks        PT Long Term Goals - 07/10/18 0843      PT LONG TERM GOAL #1   Title  PT Lt knee flexion to be to 120 to allow pt fo be able to stand from the ground to work in his yard.     Time  4    Period  Weeks    Status  On-going      PT LONG TERM GOAL #2   Title  Pt Lt knee extension to be 3 or less degrees to have a normal gait     Time  4    Period  Weeks    Status  On-going      PT LONG TERM GOAL #3   Title  PT Lt LE strength to be increased one grade to allow pt to go up and down 12 steps in a reciprocal manner without difficulty.     Time  4    Period  Weeks    Status  On-going            Plan - 07/24/18 1749    Clinical Impression Statement  Continued with established POC for end range extension and flexion ROM .  Continued wiht TKE based exercises as well functional strengthening for quad strengthening and extension.  Added contract releax to improve flexion.  EOS with manual soft tissue mobilization to  address restrictions on distal lateral hamstrings with joint mobs as well.  AROM 3-113 degrees at EOS.      Rehab Potential  Good    PT Frequency  3x / week    PT Duration  4 weeks    PT Treatment/Interventions  ADLs/Self Care Home Management;Stair training;Gait training;Therapeutic activities;Passive range of motion;Manual techniques;Therapeutic exercise;Balance training;Patient/family education    PT Next Visit Plan  continue STM lateral HS and lateral joint line, f/u on ice massage; continue progress with ROM and strength as able.    PT Home Exercise Plan  supine hamstring stretch, single leg heelraise, sit to stand, towel extension; 1/23: standing hamstring stretch and TKE.  Patient will benefit from skilled therapeutic intervention in order to improve the following deficits and impairments:  Abnormal gait, Decreased activity tolerance, Decreased balance, Decreased range of motion, Decreased endurance, Decreased strength, Pain, Impaired perceived functional ability  Visit Diagnosis: Stiffness of left knee, not elsewhere classified  Muscle weakness (generalized)     Problem List Patient Active Problem List   Diagnosis Date Noted  . 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 PICC (peripherally inserted central catheter) in place 07/02/2018  . Medication monitoring encounter 07/02/2018  . Failed partial knee, left ([place]) 05/15/2018  . Nocturnal leg cramps 09/26/2014  . Foot drop, right 09/24/2014  . 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 Upper airway cough syndrome 09/10/2014  . Aortic valve disease 11/19/2013  . Essential hypertension 11/19/2013  . Esophageal reflux 11/19/2013  . Gout 11/19/2013  . Aortic root enlargement [facility]) 11/19/2013   Ihor Austin, LPTA; CBIS [phone]  Aldona Lento 07/24/2018, 5:57 PM  [place] [address] [place], [place], [address] Phone:  [phone]   Fax:  [phone]  Name: RANDEN KAUTH MRN: [id number] Date of Birth: [birth year]

## 2018-07-25 NOTE — Progress Notes (Signed)
Patient: Marvin Munoz  DOB: [birth year] MRN: [id number] PCP: Josetta Huddle, MD  Referring Provider: Mardelle Matte    Patient Active Problem List   Diagnosis Date Noted  . 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 Medication monitoring encounter 07/02/2018  . Failed partial knee, left ([place]) 05/15/2018  . Nocturnal leg cramps 09/26/2014  . Foot drop, right 09/24/2014  . 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 Upper airway cough syndrome 09/10/2014  . Aortic valve disease 11/19/2013  . Essential hypertension 11/19/2013  . Esophageal reflux 11/19/2013  . Gout 11/19/2013  . Aortic root enlargement ([place]) 11/19/2013     Subjective:  CC:  Hospital follow up after possible prosthetic left knee infection. Feeling well and no complaints today. Possible exposure to the flu through his wife (who is on Tamiflu).   Brief ID Hx:  Marvin Munoz is a 58 y.o. male.   Marvin Munoz underwent a partial left knee replacement about 2 years ago. He presented outpatient to Dr. Mardelle Matte with about 1 year of recurrent effusions, difficulty walking and persistent intermittent pain that was described to be moderate to severe. Preoperative work up demonstrated synovitis on bone scan with negative gout or infectious work up with aspirations of knee. On 05-15-18 he underwent surgical revision and conversion to total knee replacement with all new hardware. Operative note reviewed - substantial hypertrophic scar with very thickened tissues; brownish appearance to the synovium (?pigmented villondular synvitis); The undersurface of the patella and lateral compartment had a strange blackish appearance to the cartilage that was suspicious for chronic/indolent infection. One intraoperative specimen had GNR on stain but no growth. He was treated empirically with vancomycin/ceftriaxone x 6w and transitioned to ciprofloxacin x 3w considering warmth/stiffness.   HPI: PICC line has been removed  and he is tolerating Cipro well and taking it as instructed. Has noticed a very intermittent fine tremor of the right hand but only when he does certain things. No headaches, joint pain or nausea/diarrhea. 10 week post op now. Mets with Dr. Mardelle Matte again next week. His knee swelling is improved. He is doing 25mn therapy sessions multiple times a week and stationary bike for about 15 min a day as well. After more use he does need to ice the knee but notices some morning stiffness still (5/10 pain). No fevers/chills.  His wife is currently taking Tamiflu proph after having been exposed to flu through his mother. He has no symptoms but wonders if he is at risk or needs tamiflu himself (this was 1 week ago and she is nearly completed with course.)    Review of Systems  Constitutional: Negative for chills, fever and malaise/fatigue.  Respiratory: Negative for cough and sputum production.   Cardiovascular: Negative for chest pain and leg swelling.  Gastrointestinal: Negative for constipation and diarrhea.  Genitourinary: Negative for dysuria.  Musculoskeletal: Positive for joint pain (left knee as described ).  Skin: Negative for rash.    Past Medical History:  Diagnosis Date  . Allergic rhinitis   . Eczema   . Fever blister    occasional  . Foot drop, right 09/24/2014  . Gastroesophageal reflux   . Gout    05-09-2018 per pt last flare up, 2014  . Guillain Barr syndrome ([place] 1970's   per pt received swine flu vaccination and regular flu vaccination at same time, was told one or the other may have caused guillain barre  . HTN (hypertension)   .  Migraine headache   . Mild aortic valve stenosis cardiologist-  dr Daneen Schick   per last echo  02-01-2017   moderately thickended and calcificed leaflets with mild AS and mild AR,  valve area 2.43cm^2  . 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 Nocturia   . Nocturnal leg cramps   . OA (osteoarthritis)    back,  left knee  . PONV (postoperative nausea and vomiting)   . PVC's (premature ventricular contractions)    hx of benign  . Restless legs syndrome (RLS)   . Thoracic ascending aortic aneurysm [facility])    followed by dr h. Tamala Julian--- per last CTA 02-21-2018 , 26m    Outpatient Medications Prior to Visit  Medication Sig Dispense Refill  . allopurinol (ZYLOPRIM) 300 MG tablet Take 150 mg by mouth every evening.     .Marland Kitchenaspirin EC 325 MG tablet Take 1 tablet (325 mg total) by mouth 2 (two) times daily. 60 tablet 0  . baclofen (LIORESAL) 10 MG tablet Take 1 tablet (10 mg total) by mouth 3 (three) times daily. As needed for muscle spasm 50 tablet 0  . Calcium Carb-Cholecalciferol (CALCIUM + D3 PO) Take 1 tablet by mouth daily.     . cetirizine (ZYRTEC) 10 MG tablet Take 10 mg by mouth every morning.     . ciprofloxacin (CIPRO) 750 MG tablet Take 1 tablet (750 mg total) by mouth 2 (two) times daily for 28 days. 56 tablet 0  . diltiazem (CARDIZEM CD) 240 MG 24 hr capsule Take 240 mg by mouth every evening.     .Marland KitchenFIBER PO Take 1 capsule by mouth daily.    . Multiple Vitamin (MULTIVITAMIN) tablet Take 1 tablet by mouth daily.    .Marland Kitchenomeprazole (PRILOSEC) 40 MG capsule Take 40 mg by mouth every morning.     . ondansetron (ZOFRAN) 4 MG tablet Take 1 tablet (4 mg total) by mouth every 8 (eight) hours as needed for nausea or vomiting. 10 tablet 0  . oxyCODONE (ROXICODONE) 5 MG immediate release tablet Take 1 tablet (5 mg total) by mouth every 4 (four) hours as needed for severe pain. 30 tablet 0  . pramipexole (MIRAPEX) 0.5 MG tablet Take 0.5 mg by mouth at bedtime.     . Psyllium (METAMUCIL FIBER) 51.7 % PACK Take by mouth.    . rizatriptan (MAXALT-MLT) 10 MG disintegrating tablet Take 10 mg by mouth as needed for migraine. May repeat in 2 hours if needed    . sennosides-docusate sodium (SENOKOT-S) 8.6-50 MG tablet Take 2 tablets by  mouth daily. 30 tablet 1  . traMADol (ULTRAM) 50 MG tablet Take 50 mg by mouth every 12 (twelve) hours as needed.    . valACYclovir (VALTREX) 1000 MG tablet Take 1,000 mg by mouth daily as needed (for cold sores).      No facility-administered medications prior to visit.      Allergies  Allergen Reactions  . Colchicine Diarrhea  . Influenza Vaccines Other (See Comments)    Guillain-barre syndrome  . Adhesive [Tape] Rash    Social History   Tobacco Use  . Smoking status: Never Smoker  . Smokeless tobacco: Never Used  Substance Use Topics  . Alcohol use: Not Currently    Alcohol/week: 0.0 standard drinks  . Drug use: Never    Family History  Problem Relation Age of Onset  .  Heart attack Father   . Prostate cancer Father   . COPD Mother        smoked  . Asthma Mother   . Arthritis/Rheumatoid Mother   . Leukemia Maternal Grandfather   . Prostate cancer Maternal Grandfather   . Prostate cancer Maternal Uncle     Objective:   Vitals:   07/26/18 0918  BP: 126/85  Pulse: 81  Temp: 97.8 F (36.6 C)  Weight: 215 lb (97.5 kg)   Body mass index is 26.87 kg/m.  Physical Exam Constitutional:      Appearance: Normal appearance. He is not ill-appearing or toxic-appearing.  Cardiovascular:     Rate and Rhythm: Normal rate and regular rhythm.     Heart sounds: No murmur.  Pulmonary:     Effort: Pulmonary effort is normal.     Breath sounds: Normal breath sounds. No wheezing or rales.  Musculoskeletal:     Comments: L TKR incision well healed. No swelling or tenderness on exam today. 90 deg flexion observed and full extension. Still walking with aid of cane.   Lymphadenopathy:     Cervical: No cervical adenopathy.  Skin:    General: Skin is warm and dry.  Neurological:     Mental Status: He is alert.     Lab Results: Lab Results  Component Value Date   WBC 3.9 (L) 06/03/2018   HGB 12.3 (L) 06/03/2018   HCT 37.4 (L) 06/03/2018   MCV 94.9 06/03/2018   PLT 295  06/03/2018    Lab Results  Component Value Date   CREATININE 0.98 06/03/2018   BUN 19 06/03/2018   NA 137 06/03/2018   K 4.0 06/03/2018   CL 104 06/03/2018   CO2 26 06/03/2018   No results found for: ALT, AST, GGT, ALKPHOS, BILITOT   Assessment & Plan:   Problem List Items Addressed This Visit      Unprioritized   Medication monitoring encounter - Primary    Check CRP/ESR/BMET/CBC today on long term antibiotics.       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  His knee seems to be recovering nicely as expected. He is currently doing several 50mPT sessions and multiple rides on stationary bike (157mntervals). I am confident that his infection has been well treated - plan on stopping antibiotics today (total 9 weeks).  Return to clinic in 2 months off antibiotics and ortho team.         StJanene MadeiraMSN, NP-C [facility] DiFlorenceager: [phone]: [phone]/06/20  9:44 AM

## 2018-07-26 ENCOUNTER — Encounter: Payer: Self-pay | Admitting: Infectious Diseases

## 2018-07-26 ENCOUNTER — Ambulatory Visit (INDEPENDENT_AMBULATORY_CARE_PROVIDER_SITE_OTHER): Payer: 59 | Admitting: Infectious Diseases

## 2018-07-26 ENCOUNTER — Ambulatory Visit (HOSPITAL_COMMUNITY): Payer: 59

## 2018-07-26 ENCOUNTER — Encounter (HOSPITAL_COMMUNITY): Payer: Self-pay

## 2018-07-26 VITALS — BP 126/85 | HR 81 | Temp 97.8°F | Wt 215.0 lb

## 2018-07-26 DIAGNOSIS — Z96652 Presence of left artificial knee joint: Secondary | ICD-10-CM | POA: Diagnosis not present

## 2018-07-26 DIAGNOSIS — Z792 Long term (current) use of antibiotics: Secondary | ICD-10-CM | POA: Diagnosis not present

## 2018-07-26 DIAGNOSIS — Z96659 Presence of unspecified artificial knee joint: Secondary | ICD-10-CM

## 2018-07-26 DIAGNOSIS — Z881 Allergy status to other antibiotic agents status: Secondary | ICD-10-CM | POA: Diagnosis not present

## 2018-07-26 DIAGNOSIS — T8459XD Infection and inflammatory reaction due to other internal joint prosthesis, subsequent encounter: Secondary | ICD-10-CM

## 2018-07-26 DIAGNOSIS — M6281 Muscle weakness (generalized): Secondary | ICD-10-CM

## 2018-07-26 DIAGNOSIS — Z888 Allergy status to other drugs, medicaments and biological substances status: Secondary | ICD-10-CM

## 2018-07-26 DIAGNOSIS — M25662 Stiffness of left knee, not elsewhere classified: Secondary | ICD-10-CM | POA: Diagnosis not present

## 2018-07-26 DIAGNOSIS — Z471 Aftercare following joint replacement surgery: Secondary | ICD-10-CM

## 2018-07-26 DIAGNOSIS — Z91048 Other nonmedicinal substance allergy status: Secondary | ICD-10-CM

## 2018-07-26 DIAGNOSIS — Z5181 Encounter for therapeutic drug level monitoring: Secondary | ICD-10-CM

## 2018-07-26 NOTE — Assessment & Plan Note (Signed)
Check CRP/ESR/BMET/CBC today on long term antibiotics.

## 2018-07-26 NOTE — Patient Instructions (Addendum)
Wonderful to see you!   Your knee looks to be healing up nicely.   Will repeat your blood work today to help me with decision to stop your antibiotics.   Would like to see you back one more time in 2 months to check in.   Be well! Wash hands a lot :)

## 2018-07-26 NOTE — Assessment & Plan Note (Signed)
Clinical suspicion for PJI GNR on GS from one intraop sample but no growth/ID of pathogen.  His knee seems to be recovering nicely as expected. He is currently doing several 66m PT sessions and multiple rides on stationary bike (14m intervals). I am confident that his infection has been well treated - plan on stopping antibiotics today (total 9 weeks).  Return to clinic in 2 months off antibiotics and ortho team.

## 2018-07-26 NOTE — Therapy (Signed)
[place], [place], [address] Phone: [phone]   Fax:  [phone]  Physical Therapy Treatment  Patient Details  Name: Marvin Munoz MRN: [id number] Date of Birth: 1961/03/21 Referring Provider (PT): Marchia Bond   Encounter Date: 07/26/2018  PT End of Session - 07/26/18 1346    Visit Number  8    Number of Visits  10    Date for PT Re-Evaluation  08/08/18    Authorization Type  [employer]    Authorization Time Period  1/20-->08/06/2018    Authorization - Visit Number  8    Authorization - Number of Visits  60    PT Start Time  0923    PT Stop Time  1428    PT Time Calculation (min)  45 min    Activity Tolerance  Patient tolerated treatment well    Behavior During Therapy  [facility] for tasks assessed/performed       Past Medical History:  Diagnosis Date  . Allergic rhinitis   . Eczema   . Fever blister    occasional  . Foot drop, right 09/24/2014  . Gastroesophageal reflux   . Gout    05-09-2018 per pt last flare up, 2014  . Guillain Barr syndrome ([place]) 1970's   per pt received swine flu vaccination and regular flu vaccination at same time, was told one or the other may have caused guillain barre  . HTN (hypertension)   . Migraine headache   . Mild aortic valve stenosis cardiologist-  dr Daneen Schick   per last echo  02-01-2017   moderately thickended and calcificed leaflets with mild AS and mild AR,  valve area 2.43cm^2  . 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 Nocturia   . Nocturnal leg cramps   . OA (osteoarthritis)    back,  left knee  . PONV (postoperative nausea and vomiting)   . PVC's (premature ventricular contractions)    hx of benign  . Restless legs syndrome (RLS)   . Thoracic ascending aortic aneurysm [facility])    followed by dr h. Tamala Julian--- per last CTA 02-21-2018 , 19mm    Past  Surgical History:  Procedure Laterality Date  . BREAST LUMPECTOMY Right 1990s   "benign"  . CARDIAC CATHETERIZATION  09/10/2015   dr Bonnita Nasuti [place]  @[facility]    normal coronaries and LVF,  false positive cardiolite  . COLONOSCOPY    . KNEE ARTHROSCOPY WITH MEDIAL MENISECTOMY Left 05/15/2018   Procedure: LEFT KNEE ARTHROSCOPY WITH MEDIAL MENISECTOMY;  Surgeon: Marchia Bond, MD;  Location: [facility];  Service: Orthopedics;  Laterality: Left;  . REPLACEMENT UNICONDYLAR JOINT KNEE Left 09/10/2015    dr Mardelle Matte  @[facility]   . TENDON RECONSTRUCTION Right 06/02/2016   Procedure: right elbow extensor tendon repair and debridement and repair lateral collateral ligament;  Surgeon: Ninetta Lights, MD;  Location: [place];  Service: Orthopedics;  Laterality: Right;  . TONSILLECTOMY  child  . TOTAL KNEE ARTHROPLASTY WITH REVISION COMPONENTS Left 05/15/2018   Procedure: conversion to LEFT total KNEE ARTHROPLASTY with hardware removal;  Surgeon: Marchia Bond, MD;  Location: [facility];  Service: Orthopedics;  Laterality: Left;  with block  . TRANSTHORACIC ECHOCARDIOGRAM  02/01/2017   mild LVH,  ef 30-07%, Grade 2 diastolic dysfucntion/  moderately thickened moderately calcified AV leafleat with mild stenosis and moderate regurg. (valve area 2.43cm^2)/  mild to moderate calcified MV annulus with triv. regurg/ trivial TR/  m;ild RAE    There were no vitals filed for this visit.  Subjective Assessment - 07/26/18 1346    Subjective  Pt states that the massage to his HS really loosened up and the ice massage has helped his lateral joint pain. He still has the joint pain when he goes to stnad up from sitting.     Pertinent History  OA     Patient Stated Goals  to walk without an assistive device, be able to walk and stand longer,     Currently in Pain?  Yes    Pain Score  3     Pain Location  Knee    Pain Orientation  Left;Lateral    Pain Descriptors / Indicators  Aching;Sore    Pain Type  Chronic pain     Pain Onset  More than a month ago    Pain Frequency  Constant    Aggravating Factors   exercises    Pain Relieving Factors  meds, ice    Effect of Pain on Daily Activities  limits             [facility] Adult PT Treatment/Exercise - 07/26/18 0001      Knee/Hip Exercises: Stretches   Active Hamstring Stretch  Left;3 reps;30 seconds    Active Hamstring Stretch Limitations  12in step with slight over pressure    Knee: Self-Stretch to increase Flexion  Left;5 reps;10 seconds    Knee: Self-Stretch Limitations  Knee drive 19FX 10" holds    Gastroc Stretch  Left;3 reps;30 seconds    Gastroc Stretch Limitations  slant board      Knee/Hip Exercises: Machines for Strengthening   Cybex Knee Extension  BLE, 20#, x10 reps    Cybex Knee Flexion  BLE, 4plates, x10 reps    Total Gym Leg Press  leg press BLE, 60#, 2x10reps      Knee/Hip Exercises: Standing   Forward Lunges  Both;10 reps    Functional Squat  2 sets;10 reps      Knee/Hip Exercises: Supine   Knee Extension Limitations  2    Knee Flexion Limitations  116      Manual Therapy   Manual Therapy  Soft tissue mobilization;Joint mobilization    Manual therapy comments  separate rest of treatment    Joint Mobilization  patellar mobs all directions, AP/PA tibiofemoral joint mobs for knee extension and flexion ROM    Soft tissue mobilization  STM to lateral HS along distal mm belly and tendon all the way to insertion; light and moderate STM at lateral joint line for pain control             PT Education - 07/26/18 1346    Education Details  continue HEP, exercise technique    Person(s) Educated  Patient    Methods  Explanation;Demonstration    Comprehension  Verbalized understanding;Returned demonstration       PT Short Term Goals - 07/10/18 0842      PT SHORT TERM GOAL #1   Title  PT Lt knee flexion to be to 115 to allow pt to squat easily to pick items off of the floor.     Time  2    Period  Weeks    Status   On-going      PT SHORT TERM GOAL #2   Title  Pt Lt knee extension to be no greater than five degrees to  improve pt gait.     Time  2    Period  Weeks    Status  On-going      PT SHORT TERM GOAL #3   Title  PT Lt LE strength to be increased by 1/2 grade to be able to come from sit to stand from a low lying sofa without difficulty     Time  2    Period  Weeks        PT Long Term Goals - 07/10/18 0843      PT LONG TERM GOAL #1   Title  PT Lt knee flexion to be to 120 to allow pt fo be able to stand from the ground to work in his yard.     Time  4    Period  Weeks    Status  On-going      PT LONG TERM GOAL #2   Title  Pt Lt knee extension to be 3 or less degrees to have a normal gait     Time  4    Period  Weeks    Status  On-going      PT LONG TERM GOAL #3   Title  PT Lt LE strength to be increased one grade to allow pt to go up and down 12 steps in a reciprocal manner without difficulty.     Time  4    Period  Weeks    Status  On-going            Plan - 07/26/18 1430    Clinical Impression Statement  Continued with established POC focusing on last bit of ROM and strengthening. Min cues for form throughout therex. Added lunging, machine strengthening, and resumed squats and leg press this date. Assessed ITB and it had increased tightness but did not recreate his same pain. Continue to feel that this lateral joint pain is related to the joint itself and the natural healing process following his h/o infection and knee replacement. Continued with manual for lateral HS to control that pain. AROM 2-116deg this date. Educated pt to ice his knee well this evening due to the new exercises performed and he verbalized understanding.     Rehab Potential  Good    PT Frequency  3x / week    PT Duration  4 weeks    PT Treatment/Interventions  ADLs/Self Care Home Management;Stair training;Gait training;Therapeutic activities;Passive range of motion;Manual techniques;Therapeutic  exercise;Balance training;Patient/family education    PT Next Visit Plan  contnue mahcines if tolerated well; continue STM lateral HS and lateral joint line, f/u on ice massage; continue progress with ROM and strength as able.    PT Home Exercise Plan  supine hamstring stretch, single leg heelraise, sit to stand, towel extension; 1/23: standing hamstring stretch and TKE.    Consulted and Agree with Plan of Care  Patient       Patient will benefit from skilled therapeutic intervention in order to improve the following deficits and impairments:  Abnormal gait, Decreased activity tolerance, Decreased balance, Decreased range of motion, Decreased endurance, Decreased strength, Pain, Impaired perceived functional ability  Visit Diagnosis: Stiffness of left knee, not elsewhere classified  Muscle weakness (generalized)     Problem List Patient Active Problem List   Diagnosis Date Noted  . Suspected infection of left prosthetic knee joint ([place]) 07/02/2018  . Medication monitoring encounter 07/02/2018  . Failed partial knee, left ([place]) 05/15/2018  . Nocturnal leg cramps 09/26/2014  . Foot drop,  right 09/24/2014  . 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 Upper airway cough syndrome 09/10/2014  . Aortic valve disease 11/19/2013  . Essential hypertension 11/19/2013  . Esophageal reflux 11/19/2013  . Gout 11/19/2013  . Aortic root enlargement ([place]) 11/19/2013         Geraldine Solar PT, DPT   [place] [address] [place], [place], [address] Phone: [phone]   Fax:  [phone]  Name: AVENIR LOZINSKI MRN: [id number] Date of Birth: March 13, 1961

## 2018-07-27 LAB — BASIC METABOLIC PANEL
BUN: 16 mg/dL (ref 7–25)
CALCIUM: 9.9 mg/dL (ref 8.6–10.3)
CO2: 28 mmol/L (ref 20–32)
Chloride: 105 mmol/L (ref 98–110)
Creat: 1.11 mg/dL (ref 0.70–1.33)
Glucose, Bld: 103 mg/dL — ABNORMAL HIGH (ref 65–99)
POTASSIUM: 4.7 mmol/L (ref 3.5–5.3)
Sodium: 140 mmol/L (ref 135–146)

## 2018-07-27 LAB — C-REACTIVE PROTEIN: CRP: 1.9 mg/L (ref <8.0)

## 2018-07-27 LAB — CBC
HCT: 38.5 % (ref 38.5–50.0)
HEMOGLOBIN: 13.1 g/dL — AB (ref 13.2–17.1)
MCH: 31.4 pg (ref 27.0–33.0)
MCHC: 34 g/dL (ref 32.0–36.0)
MCV: 92.3 fL (ref 80.0–100.0)
MPV: 10.8 fL (ref 7.5–12.5)
PLATELETS: 248 10*3/uL (ref 140–400)
RBC: 4.17 10*6/uL — ABNORMAL LOW (ref 4.20–5.80)
RDW: 12 % (ref 11.0–15.0)
WBC: 4.8 10*3/uL (ref 3.8–10.8)

## 2018-07-27 LAB — SEDIMENTATION RATE: SED RATE: 6 mm/h (ref 0–20)

## 2018-07-27 NOTE — Progress Notes (Signed)
Markers normalized. Finish out last week of cipro then stop.

## 2018-07-31 ENCOUNTER — Other Ambulatory Visit: Payer: Self-pay

## 2018-07-31 ENCOUNTER — Ambulatory Visit (HOSPITAL_COMMUNITY): Payer: 59

## 2018-07-31 ENCOUNTER — Encounter (HOSPITAL_COMMUNITY): Payer: Self-pay

## 2018-07-31 DIAGNOSIS — M25662 Stiffness of left knee, not elsewhere classified: Secondary | ICD-10-CM

## 2018-07-31 DIAGNOSIS — M6281 Muscle weakness (generalized): Secondary | ICD-10-CM

## 2018-07-31 NOTE — Patient Instructions (Signed)
Lunge with Counter Support reps: 10 sets: 1-2 daily: 1 weekly: 7   Exercise image step 1   Exercise image step 2 Setup  Begin in a standing upright position with your hand resting on a counter at your side. Movement  Step forward and bend your knees to lower your body into a lunge position. Press into your feet and straighten your legs to stand up and repeat. Tip  Make sure to keep your trunk upright and use the counter to help you balance as needed. Do not let your front knee collapse inward or bend forward past your toes.

## 2018-07-31 NOTE — Therapy (Signed)
[place], [place], [address] Phone: [phone]   Fax:  [phone]  Physical Therapy Treatment  Patient Details  Name: Marvin Munoz MRN: [id number] Date of Birth: 1961-04-30 Referring Provider (PT): Marchia Bond   Encounter Date: 07/31/2018  PT End of Session - 07/31/18 0825    Visit Number  9    Number of Visits  10    Date for PT Re-Evaluation  08/08/18    Authorization Type  U[employer]    Authorization Time Period  1/20-->08/06/2018    Authorization - Visit Number  9    Authorization - Number of Visits  60    PT Start Time  0816    PT Stop Time  0902    PT Time Calculation (min)  46 min    Activity Tolerance  Patient tolerated treatment well    Behavior During Therapy  [facility] for tasks assessed/performed       Past Medical History:  Diagnosis Date  . Allergic rhinitis   . Eczema   . Fever blister    occasional  . Foot drop, right 09/24/2014  . Gastroesophageal reflux   . Gout    05-09-2018 per pt last flare up, 2014  . Guillain Barr syndrome ([place]) 1970's   per pt received swine flu vaccination and regular flu vaccination at same time, was told one or the other may have caused guillain barre  . HTN (hypertension)   . Migraine headache   . Mild aortic valve stenosis cardiologist-  dr Daneen Schick   per last echo  02-01-2017   moderately thickended and calcificed leaflets with mild AS and mild AR,  valve area 2.43cm^2  . 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 Nocturia   . Nocturnal leg cramps   . OA (osteoarthritis)    back,  left knee  . PONV (postoperative nausea and vomiting)   . PVC's (premature ventricular contractions)    hx of benign  . Restless legs syndrome (RLS)   . Thoracic ascending aortic aneurysm [facility])    followed by dr h. Tamala Julian--- per last CTA 02-21-2018 , 41mm    Past  Surgical History:  Procedure Laterality Date  . BREAST LUMPECTOMY Right 1990s   "benign"  . CARDIAC CATHETERIZATION  09/10/2015   dr Bonnita Nasuti [place]  @[facility]    normal coronaries and LVF,  false positive cardiolite  . COLONOSCOPY    . KNEE ARTHROSCOPY WITH MEDIAL MENISECTOMY Left 05/15/2018   Procedure: LEFT KNEE ARTHROSCOPY WITH MEDIAL MENISECTOMY;  Surgeon: Marchia Bond, MD;  Location: [facility];  Service: Orthopedics;  Laterality: Left;  . REPLACEMENT UNICONDYLAR JOINT KNEE Left 09/10/2015    dr Mardelle Matte  @[facility]   . TENDON RECONSTRUCTION Right 06/02/2016   Procedure: right elbow extensor tendon repair and debridement and repair lateral collateral ligament;  Surgeon: Ninetta Lights, MD;  Location: [facility];  Service: Orthopedics;  Laterality: Right;  . TONSILLECTOMY  child  . TOTAL KNEE ARTHROPLASTY WITH REVISION COMPONENTS Left 05/15/2018   Procedure: conversion to LEFT total KNEE ARTHROPLASTY with hardware removal;  Surgeon: Marchia Bond, MD;  Location: [facility];  Service: Orthopedics;  Laterality: Left;  with block  . TRANSTHORACIC ECHOCARDIOGRAM  02/01/2017   mild LVH,  ef 38-10%, Grade 2 diastolic dysfucntion/  moderately thickened moderately calcified AV leafleat with mild stenosis and moderate regurg. (valve area 2.43cm^2)/  mild to moderate calcified MV annulus with triv. regurg/ trivial TR/  m;ild RAE    There were no vitals filed for this visit.  Subjective Assessment - 07/31/18 0818    Subjective  Patient reports he is riding his bike at home at least 1x per day and that he is doing his HEP regularly. He states his knee still pops and cracks some when he first moves.     Pertinent History  OA     Limitations  Standing;Walking    Patient Stated Goals  to walk without an assistive device, be able to walk and stand longer,     Currently in Pain?  Yes    Pain Score  5     Pain Location  Knee    Pain Orientation  Left    Pain Descriptors / Indicators  Aching;Sore    Pain  Type  Chronic pain    Pain Onset  More than a month ago    Pain Frequency  Constant    Aggravating Factors   when he first gets moving    Pain Relieving Factors  ice, rest, medicine    Effect of Pain on Daily Activities  limited some         [facility] PT Assessment - 07/31/18 0001      Assessment   Medical Diagnosis  Lt TKR    Referring Provider (PT)  Marchia Bond    Onset Date/Surgical Date  05/16/18    Hand Dominance  Right    Next MD Visit  08/03/2018    Prior Therapy  HH      Precautions   Precautions  None      Restrictions   Weight Bearing Restrictions  No      Observation/Other Assessments   Focus on Therapeutic Outcomes (FOTO)   56% limited   was 72% limited on 07/09/2018      [facility] Adult PT Treatment/Exercise - 07/31/18 0001      Exercises   Exercises  Knee/Hip      Knee/Hip Exercises: Stretches   Active Hamstring Stretch  Left;3 reps;30 seconds    Active Hamstring Stretch Limitations  12in step with slight over pressure    Knee: Self-Stretch to increase Flexion  Left;3 reps;30 seconds;Limitations    Knee: Self-Stretch Limitations  12" step knee drives      Knee/Hip Exercises: Machines for Strengthening   Cybex Knee Extension  Lt LE only, 2x 12 reps, 10lbs    Cybex Knee Flexion  Lt LE only, 2x 12 reps, 4 plates ([id number])    Cybex Leg Press  [employer]craft Leg Press: 2x 12 reps, 60lbs bil LE      Knee/Hip Exercises: Standing   Heel Raises  Both;1 set;20 reps;3 seconds    Heel Raises Limitations  1x 20 reps toe raises on decline    Forward Lunges  Both;1 set;10 reps;Limitations    Forward Lunges Limitations  4" step    Functional Squat  2 sets;10 reps;Limitations    Functional Squat Limitations  cues for form to improve hip hinge      Manual Therapy   Manual Therapy  Soft tissue mobilization    Manual therapy comments  separate rest of treatment    Soft tissue mobilization  STM to vastus lateralis on Lt knee to address paina nd myofascial restrictions. Increased  bulk/turgor noted superior to muscle insertion.        PT Education - 07/31/18 0824    Education Details  Educatd on  plan for re-assessment next session. Educated on exercises throughout and form with squats. Updated HEP.    Person(s) Educated  Patient    Methods  Explanation;Handout    Comprehension  Verbalized understanding;Returned demonstration       PT Short Term Goals - 07/10/18 0842      PT SHORT TERM GOAL #1   Title  PT Lt knee flexion to be to 115 to allow pt to squat easily to pick items off of the floor.     Time  2    Period  Weeks    Status  On-going      PT SHORT TERM GOAL #2   Title  Pt Lt knee extension to be no greater than five degrees to improve pt gait.     Time  2    Period  Weeks    Status  On-going      PT SHORT TERM GOAL #3   Title  PT Lt LE strength to be increased by 1/2 grade to be able to come from sit to stand from a low lying sofa without difficulty     Time  2    Period  Weeks        PT Long Term Goals - 07/10/18 0843      PT LONG TERM GOAL #1   Title  PT Lt knee flexion to be to 120 to allow pt fo be able to stand from the ground to work in his yard.     Time  4    Period  Weeks    Status  On-going      PT LONG TERM GOAL #2   Title  Pt Lt knee extension to be 3 or less degrees to have a normal gait     Time  4    Period  Weeks    Status  On-going      PT LONG TERM GOAL #3   Title  PT Lt LE strength to be increased one grade to allow pt to go up and down 12 steps in a reciprocal manner without difficulty.     Time  4    Period  Weeks    Status  On-going        Plan - 07/31/18 0825    Clinical Impression Statement  Continued with progression towards functional strengthening and with stretches to address ROM deficits. Patient advanced lunges this date to 4" step with bil UE support and required tactile cues for form initially but improved with repetition. He continued with machines for LE strengthening and advanced to Lt LE only.  Completed FOTO this session and patient has improved from reporting 72% limitation to 56% limitation with functional mobility. Discussed plan for re-assessment next date. Patient will continue to benefit from skilled PT interventions to address impairments and progress towards goals.    Rehab Potential  Good    PT Frequency  3x / week    PT Duration  4 weeks    PT Treatment/Interventions  ADLs/Self Care Home Management;Stair training;Gait training;Therapeutic activities;Passive range of motion;Manual techniques;Therapeutic exercise;Balance training;Patient/family education    PT Next Visit Plan  Continue machines for strengthening. Continue STM to lateral HS and quadriceps for pain relief. Continue progress with ROM and strength as able.     PT Home Exercise Plan  supine hamstring stretch, single leg heelraise, sit to stand, towel extension; 1/23: standing hamstring stretch and TKE.    Consulted and Agree with Plan of Care  Patient       Patient will benefit from skilled therapeutic intervention in order to improve the following deficits and impairments:  Abnormal gait, Decreased activity tolerance, Decreased balance, Decreased range of motion, Decreased endurance, Decreased strength, Pain, Impaired perceived functional ability  Visit Diagnosis: Stiffness of left knee, not elsewhere classified  Muscle weakness (generalized)     Problem List Patient Active Problem List   Diagnosis Date Noted  . Suspected infection of left prosthetic knee joint ([place]) 07/02/2018  . Medication monitoring encounter 07/02/2018  . Failed partial knee, left ([place]) 05/15/2018  . Nocturnal leg cramps 09/26/2014  . Foot drop, right 09/24/2014  . 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 Upper airway cough syndrome 09/10/2014  . Aortic valve disease 11/19/2013  . Essential hypertension 11/19/2013  . Esophageal reflux 11/19/2013  . Gout 11/19/2013  . Aortic root enlargement ([place]) 11/19/2013     Kipp Brood, PT, DPT, [facility] Physical Therapist with [facility]  07/31/2018 12:22 PM    [place] [address] [place], [place], [address] Phone: [phone]   Fax:  [phone]  Name: Marvin Munoz MRN: [id number] Date of Birth: [birth year]

## 2018-08-02 ENCOUNTER — Encounter (HOSPITAL_COMMUNITY): Payer: Self-pay

## 2018-08-02 ENCOUNTER — Ambulatory Visit (HOSPITAL_COMMUNITY): Payer: 59

## 2018-08-02 DIAGNOSIS — M6281 Muscle weakness (generalized): Secondary | ICD-10-CM

## 2018-08-02 DIAGNOSIS — M25662 Stiffness of left knee, not elsewhere classified: Secondary | ICD-10-CM

## 2018-08-02 NOTE — Therapy (Signed)
[place] Mun[place], [place], [address] Phone: [phone]   Fax:  [phone]   Progress Note Reporting Period 07/10/18 to 08/02/18  See note below for Objective Data and Assessment of Progress/Goals.    Physical Therapy Treatment  Patient Details  Name: Marvin Munoz MRN: [id number] Date of Birth: 07-26-1960 Referring Provider (PT): Marchia Bond   Encounter Date: 08/02/2018  PT End of Session - 08/02/18 0815    Visit Number  10    Number of Visits  18    Date for PT Re-Evaluation  08/30/18    Authorization Type  UHC    Authorization Time Period  1/20-->08/06/2018; 08/02/18 to 08/30/18    Authorization - Visit Number  10    Authorization - Number of Visits  60    PT Start Time  0815    PT Stop Time  0856    PT Time Calculation (min)  41 min    Activity Tolerance  Patient tolerated treatment well    Behavior During Therapy  [facility] for tasks assessed/performed       Past Medical History:  Diagnosis Date  . Allergic rhinitis   . Eczema   . Fever blister    occasional  . Foot drop, right 09/24/2014  . Gastroesophageal reflux   . Gout    05-09-2018 per pt last flare up, 2014  . Guillain Barr syndrome ([place]) 1970's   per pt received swine flu vaccination and regular flu vaccination at same time, was told one or the other may have caused guillain barre  . HTN (hypertension)   . Migraine headache   . Mild aortic valve stenosis cardiologist-  dr Daneen Schick   per last echo  02-01-2017   moderately thickended and calcificed leaflets with mild AS and mild AR,  valve area 2.43cm^2  . 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 Nocturia   . Nocturnal leg cramps   . OA (osteoarthritis)    back,  left knee  . PONV (postoperative nausea and vomiting)   . PVC's (premature ventricular contractions)    hx of benign  .  Restless legs syndrome (RLS)   . Thoracic ascending aortic aneurysm [facility])    followed by dr h. Tamala Julian--- per last CTA 02-21-2018 , 76m    Past Surgical History:  Procedure Laterality Date  . BREAST LUMPECTOMY Right 1990s   "benign"  . CARDIAC CATHETERIZATION  09/10/2015   dr hBonnita Nasutipreston  _0    normal coronaries and LVF,  false positive cardiolite  . COLONOSCOPY    . KNEE ARTHROSCOPY WITH MEDIAL MENISECTOMY Left 05/15/2018   Procedure: LEFT KNEE ARTHROSCOPY WITH MEDIAL MENISECTOMY;  Surgeon: LMarchia Bond MD;  [facility];  Service: Orthopedics;  Laterality: Left;  . REPLACEMENT UNICONDYLAR JOINT KNEE Left 09/10/2015    dr lMardelle Matte _1   . TENDON RECONSTRUCTION Right 06/02/2016   Procedure: right elbow extensor tendon repair and debridement and repair lateral collateral ligament;  Surgeon: DNinetta Lights MD;  Location: [facility]  Service: Orthopedics;  Laterality: Right;  . TONSILLECTOMY  child  . TOTAL KNEE ARTHROPLASTY WITH REVISION COMPONENTS Left 05/15/2018   Procedure: conversion to LEFT total KNEE ARTHROPLASTY with hardware removal;  Surgeon: LMarchia Bond MD;  [facility];  Service: Orthopedics;  Laterality: Left;  with block  . TRANSTHORACIC ECHOCARDIOGRAM  02/01/2017  mild LVH,  ef 28-78%, Grade 2 diastolic dysfucntion/  moderately thickened moderately calcified AV leafleat with mild stenosis and moderate regurg. (valve area 2.43cm^2)/  mild to moderate calcified MV annulus with triv. regurg/ trivial TR/  m;ild RAE    There were no vitals filed for this visit.  Subjective Assessment - 08/02/18 0816    Subjective  Pt states that his lateral knee and posterior knee are still bothering him, with the lateral portion being more than the HS.     Pertinent History  OA     Limitations  Standing;Walking    Patient Stated Goals  to walk without an assistive device, be able to walk and stand longer,     Currently in Pain?  Yes    Pain Score  4      Pain Location  Knee    Pain Orientation  Left    Pain Descriptors / Indicators  Aching;Sore    Pain Type  Chronic pain    Pain Onset  More than a month ago    Pain Frequency  Constant    Aggravating Factors   when he first gets moving    Pain Relieving Factors  ice, rest, medicine    Effect of Pain on Daily Activities  limited some         [facility] PT Assessment - 08/02/18 0001      Assessment   Medical Diagnosis  Lt TKR    Referring Provider (PT)  Marchia Bond    Onset Date/Surgical Date  05/16/18    Hand Dominance  Right    Next MD Visit  08/03/2018    Prior Therapy  HH      Observation/Other Assessments   Focus on Therapeutic Outcomes (FOTO)   61% limited   was 56% limited     AROM   Left Knee Extension  2   was 10   Left Knee Flexion  117   was 105     Strength   Left Hip Extension  5/5   was 4+   Left Knee Flexion  4+/5   was 4   Left Knee Extension  5/5   was 4   Left Ankle Dorsiflexion  5/5   was 4   Left Ankle Plantar Flexion  --   was 4          [facility] Adult PT Treatment/Exercise - 08/02/18 0001      Knee/Hip Exercises: Stretches   Active Hamstring Stretch  Left;3 reps;30 seconds    Active Hamstring Stretch Limitations  12in step with slight over pressure    Knee: Self-Stretch to increase Flexion  Left;5 reps;10 seconds    Knee: Self-Stretch Limitations  12" step knee drives    Gastroc Stretch  Left;3 reps;30 seconds    Gastroc Stretch Limitations  standing 4" step      Knee/Hip Exercises: Machines for Strengthening   Cybex Knee Extension  Lt LE only, 2x 12 reps, 10lbs    Total Gym Leg Press  leg press BLE, 70#, 2x10reps      Knee/Hip Exercises: Standing   Hip Abduction  Both;2 sets;10 reps    Abduction Limitations  GTB    Other Standing Knee Exercises  sidestepping GTB 35f x3RT           PT Education - 08/02/18 0815    Education Details  reassessment findings    Person(s) Educated  Patient    Methods  Explanation;Demonstration     Comprehension  Verbalized  understanding;Returned demonstration       PT Short Term Goals - 08/02/18 0818      PT SHORT TERM GOAL #1   Title  PT Lt knee flexion to be to 115 to allow pt to squat easily to pick items off of the floor.     Baseline  2/13: 117deg    Time  2    Period  Weeks    Status  Achieved      PT SHORT TERM GOAL #2   Title  Pt Lt knee extension to be no greater than five degrees to improve pt gait.     Baseline  2/13: 2deg    Time  2    Period  Weeks    Status  Achieved      PT SHORT TERM GOAL #3   Title  PT Lt LE strength to be increased by 1/2 grade to be able to come from sit to stand from a low lying sofa without difficulty     Baseline  2/13: see MMT    Time  2    Period  Weeks    Status  Partially Met        PT Long Term Goals - 08/02/18 0819      PT LONG TERM GOAL #1   Title  PT Lt knee flexion to be to 120 to allow pt fo be able to stand from the ground to work in his yard.     Baseline  2/13: 117deg    Time  4    Period  Weeks    Status  On-going      PT LONG TERM GOAL #2   Title  Pt Lt knee extension to be 3 or less degrees to have a normal gait     Baseline  2/13: 2 deg    Time  4    Period  Weeks    Status  Achieved      PT LONG TERM GOAL #3   Title  PT Lt LE strength to be increased one grade to allow pt to go up and down 12 steps in a reciprocal manner without difficulty.     Baseline  2/13: see MMT, still having difficulty descending stairs    Time  4    Period  Weeks    Status  Partially Met      PT LONG TERM GOAL #4   Title  Pt will report being able to walk for 2 hours or > with 3/10 L knee pain or < in order to demo improved tolerance to WB and maximize his work duties.     Time  4    Period  Weeks    Status  New    Target Date  08/30/18      PT LONG TERM GOAL #5   Title  Pt will report being able to ascend and descend a full flight of stairs in reciprocal manner confidently with 3/10 L knee pain or < to demo improved  functional strength and maximize HH and community access.     Time  4    Period  Weeks    Status  New            Plan - 08/02/18 3009    Clinical Impression Statement  PT reassessed pt's goals and outcome measures this date. Pt has made good progress towards goals as illustrated above. His AROM is progressing nicely as he was 2-__deg this date, his  MMT has improved by at least 1/2 grade or more throughout. His main limitation is the lateral joint pain with extension from flexed position or flexion from extension position as well as hamstring pain; still unclear what is causing this pain but educated him to discuss it with Dr. Mardelle Matte tomorrow at his f/u. Pt also reporting that he does not feel confident in his ability to RTW either due to difficulty walking for long periods of time. Pt needs continued skilled PT intervention to address his remaining impairments and strength deficits in order to reduce pain, continue to normalize AROM, strength, and promote return to PLOF. Updated goals this date.     Rehab Potential  Good    PT Frequency  2x / week    PT Duration  4 weeks    PT Treatment/Interventions  ADLs/Self Care Home Management;Stair training;Gait training;Therapeutic activities;Passive range of motion;Manual techniques;Therapeutic exercise;Balance training;Patient/family education;Dry needling;Taping;Neuromuscular re-education;Cryotherapy;Electrical Stimulation;Ultrasound;Functional mobility training;Orthotic Fit/Training;Scar mobilization;Joint Manipulations    PT Next Visit Plan  Continue machines for strengthening. Continue STM to lateral HS and quadriceps for pain relief. Continue progress with ROM and strength as able.     PT Home Exercise Plan  supine hamstring stretch, single leg heelraise, sit to stand, towel extension; 1/23: standing hamstring stretch and TKE.    Consulted and Agree with Plan of Care  Patient       Patient will benefit from skilled therapeutic intervention in  order to improve the following deficits and impairments:  Abnormal gait, Decreased activity tolerance, Decreased balance, Decreased range of motion, Decreased endurance, Decreased strength, Pain, Impaired perceived functional ability  Visit Diagnosis: Stiffness of left knee, not elsewhere classified - Plan: PT plan of care cert/re-cert  Muscle weakness (generalized) - Plan: PT plan of care cert/re-cert     Problem List Patient Active Problem List   Diagnosis Date Noted  . Suspected infection of left prosthetic knee joint ([place]) 07/02/2018  . Medication monitoring encounter 07/02/2018  . Failed partial knee, left ([place]) 05/15/2018  . Nocturnal leg cramps 09/26/2014  . Foot drop, right 09/24/2014  . 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 Upper airway cough syndrome 09/10/2014  . Aortic valve disease 11/19/2013  . Essential hypertension 11/19/2013  . Esophageal reflux 11/19/2013  . Gout 11/19/2013  . Aortic root enlargement ([place]) 11/19/2013       Geraldine Solar PT, DPT   [place] [address] [place], [place], [address] Phone: [phone]   Fax:  [phone]  Name: Marvin Munoz MRN: [id number] Date of Birth: 10/26/1960

## 2018-08-07 ENCOUNTER — Ambulatory Visit (HOSPITAL_COMMUNITY): Payer: 59

## 2018-08-07 ENCOUNTER — Encounter (HOSPITAL_COMMUNITY): Payer: Self-pay

## 2018-08-07 DIAGNOSIS — M6281 Muscle weakness (generalized): Secondary | ICD-10-CM

## 2018-08-07 DIAGNOSIS — M25662 Stiffness of left knee, not elsewhere classified: Secondary | ICD-10-CM | POA: Diagnosis not present

## 2018-08-07 NOTE — Therapy (Signed)
[place], [place], [address] Phone: [phone]   Fax:  [phone]  Physical Therapy Treatment  Patient Details  Name: Marvin Munoz MRN: [id number] Date of Birth: January 03, 1961 Referring Provider (PT): Marchia Bond   Encounter Date: 08/07/2018  PT End of Session - 08/07/18 0813    Visit Number  11    Number of Visits  18    Date for PT Re-Evaluation  08/30/18    Authorization Type  [employer]    Authorization Time Period  1/20-->08/06/2018; 08/02/18 to 08/30/18    Authorization - Visit Number  11    Authorization - Number of Visits  60    PT Start Time  0813    PT Stop Time  1324    PT Time Calculation (min)  40 min    Activity Tolerance  Patient tolerated treatment well    Behavior During Therapy  [facility] for tasks assessed/performed       Past Medical History:  Diagnosis Date  . Allergic rhinitis   . Eczema   . Fever blister    occasional  . Foot drop, right 09/24/2014  . Gastroesophageal reflux   . Gout    05-09-2018 per pt last flare up, 2014  . Guillain Barr syndrome ([place]) 1970's   per pt received swine flu vaccination and regular flu vaccination at same time, was told one or the other may have caused guillain barre  . HTN (hypertension)   . Migraine headache   . Mild aortic valve stenosis cardiologist-  dr Daneen Schick   per last echo  02-01-2017   moderately thickended and calcificed leaflets with mild AS and mild AR,  valve area 2.43cm^2  . 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 Nocturia   . Nocturnal leg cramps   . OA (osteoarthritis)    back,  left knee  . PONV (postoperative nausea and vomiting)   . PVC's (premature ventricular contractions)    hx of benign  . Restless legs syndrome (RLS)   . Thoracic ascending aortic aneurysm [facility])    followed by dr h. Tamala Julian--- per last CTA 02-21-2018  , 42m    Past Surgical History:  Procedure Laterality Date  . BREAST LUMPECTOMY Right 1990s   "benign"  . CARDIAC CATHETERIZATION  09/10/2015   dr hBonnita Nasutipreston  _0    normal coronaries and LVF,  false positive cardiolite  . COLONOSCOPY    . KNEE ARTHROSCOPY WITH MEDIAL MENISECTOMY Left 05/15/2018   Procedure: LEFT KNEE ARTHROSCOPY WITH MEDIAL MENISECTOMY;  Surgeon: LMarchia Bond MD;  [facility]: [facility];  Service: Orthopedics;  Laterality: Left;  . REPLACEMENT UNICONDYLAR JOINT KNEE Left 09/10/2015    dr lMardelle Matte _1   . TENDON RECONSTRUCTION Right 06/02/2016   Procedure: right elbow extensor tendon repair and debridement and repair lateral collateral ligament;  Surgeon: DNinetta Lights MD;  [place]  Service: Orthopedics;  Laterality: Right;  . TONSILLECTOMY  child  . TOTAL KNEE ARTHROPLASTY WITH REVISION COMPONENTS Left 05/15/2018   Procedure: conversion to LEFT total KNEE ARTHROPLASTY with hardware removal;  Surgeon: LMarchia Bond MD;  [facility];  Service: Orthopedics;  Laterality: Left;  with block  . TRANSTHORACIC ECHOCARDIOGRAM  02/01/2017   mild LVH,  ef 640-10% Grade 2 diastolic dysfucntion/  moderately thickened moderately calcified AV leafleat with mild stenosis and moderate regurg. (  valve area 2.43cm^2)/  mild to moderate calcified MV annulus with triv. regurg/ trivial TR/  m;ild RAE    There were no vitals filed for this visit.  Subjective Assessment - 08/07/18 0813    Subjective  Pt states that he saw his MD and the MD's PA-C on Friday. He told them about the pain he has been having on the lateral aspect of his knee and the PA-C told him that it could be coming from the Cipro as joint pain and tendon pain is a common side effect of that medication. WIth regards to his ROM and overall knee progress, Dr. Mardelle Matte was pleased.     Pertinent History  OA     Limitations  Standing;Walking    Patient Stated Goals  to walk without an assistive  device, be able to walk and stand longer,     Currently in Pain?  Yes    Pain Score  3     Pain Location  Knee    Pain Orientation  Left    Pain Descriptors / Indicators  Aching;Sore    Pain Type  Chronic pain    Pain Onset  More than a month ago    Pain Frequency  Constant    Aggravating Factors   when he first gets moving    Pain Relieving Factors  ice, rest, medication    Effect of Pain on Daily Activities  limited some             [facility] Adult PT Treatment/Exercise - 08/07/18 0001      Knee/Hip Exercises: Stretches   Active Hamstring Stretch  Left;3 reps;30 seconds    Active Hamstring Stretch Limitations  12in step with slight over pressure    Knee: Self-Stretch to increase Flexion  Left;5 reps;10 seconds    Knee: Self-Stretch Limitations  12" step knee drives    Gastroc Stretch  Left;3 reps;30 seconds    Gastroc Stretch Limitations  slant board      Knee/Hip Exercises: Machines for Strengthening   Cybex Knee Extension  Lt LE only, 10#, 12x3" holds    Cybex Knee Flexion  Lt LE only, 4 plates 12x5" holds    Total Gym Leg Press  leg press BLE concentric, LLE eccentric 60# x10 reps      Knee/Hip Exercises: Standing   Forward Lunges  Both;15 reps    Forward Lunges Limitations  floor    SLS with Vectors  LLE foam, x5RT    Rebounder  tandem stance foam +UE flex 5# bar x10 reps each    Walking with Sports Cord  fwd/retro tandem gait x2RT (can be progressed to foam next visit)    Other Standing Knee Exercises  bil RDLs 15# 2x10 reps      Knee/Hip Exercises: Supine   Knee Extension Limitations  2    Knee Flexion Limitations  117           PT Education - 08/07/18 0813    Education Details  exercise technique, continue HEP    Person(s) Educated  Patient    Methods  Explanation;Demonstration    Comprehension  Verbalized understanding;Returned demonstration       PT Short Term Goals - 08/02/18 0818      PT SHORT TERM GOAL #1   Title  PT Lt knee flexion to be to  115 to allow pt to squat easily to pick items off of the floor.     Baseline  2/13: 117deg    Time  2    Period  Weeks    Status  Achieved      PT SHORT TERM GOAL #2   Title  Pt Lt knee extension to be no greater than five degrees to improve pt gait.     Baseline  2/13: 2deg    Time  2    Period  Weeks    Status  Achieved      PT SHORT TERM GOAL #3   Title  PT Lt LE strength to be increased by 1/2 grade to be able to come from sit to stand from a low lying sofa without difficulty     Baseline  2/13: see MMT    Time  2    Period  Weeks    Status  Partially Met        PT Long Term Goals - 08/02/18 0819      PT LONG TERM GOAL #1   Title  PT Lt knee flexion to be to 120 to allow pt fo be able to stand from the ground to work in his yard.     Baseline  2/13: 117deg    Time  4    Period  Weeks    Status  On-going      PT LONG TERM GOAL #2   Title  Pt Lt knee extension to be 3 or less degrees to have a normal gait     Baseline  2/13: 2 deg    Time  4    Period  Weeks    Status  Achieved      PT LONG TERM GOAL #3   Title  PT Lt LE strength to be increased one grade to allow pt to go up and down 12 steps in a reciprocal manner without difficulty.     Baseline  2/13: see MMT, still having difficulty descending stairs    Time  4    Period  Weeks    Status  Partially Met      PT LONG TERM GOAL #4   Title  Pt will report being able to walk for 2 hours or > with 3/10 L knee pain or < in order to demo improved tolerance to WB and maximize his work duties.     Time  4    Period  Weeks    Status  New    Target Date  08/30/18      PT LONG TERM GOAL #5   Title  Pt will report being able to ascend and descend a full flight of stairs in reciprocal manner confidently with 3/10 L knee pain or < to demo improved functional strength and maximize HH and community access.     Time  4    Period  Weeks    Status  New            Plan - 08/07/18 0854    Clinical Impression  Statement  Pt f/u with his surgeon and PA-C on Friday; they were overall pleased with his knee progress and ROM and feel that this lateral joint pain he has been feeling is likely side effects of taking the antibiotic Cipro for a long time. Today's session, focused on functional strengthening and balance. Min cues for form throughout therex and continued reports of discomfort in lateral L knee during flex/ext transitional movements. Added RDLs this date for posterior chain strengthening; pt did very well with this. Continued with machine strengthening for quad and HS strength but  progressed leg press to L single leg eccentric and pt was very challenged with this. AROM 2 to 117deg this date. Continue as planned, progressing as able.     Rehab Potential  Good    PT Frequency  2x / week    PT Duration  4 weeks    PT Treatment/Interventions  ADLs/Self Care Home Management;Stair training;Gait training;Therapeutic activities;Passive range of motion;Manual techniques;Therapeutic exercise;Balance training;Patient/family education;Dry needling;Taping;Neuromuscular re-education;Cryotherapy;Electrical Stimulation;Ultrasound;Functional mobility training;Orthotic Fit/Training;Scar mobilization;Joint Manipulations    PT Next Visit Plan  Continue machines for single leg strengthening. Continue STM to lateral HS and quadriceps for pain relief. Continue progress with ROM and strength as able.     PT Home Exercise Plan  supine hamstring stretch, single leg heelraise, sit to stand, towel extension; 1/23: standing hamstring stretch and TKE.    Consulted and Agree with Plan of Care  Patient       Patient will benefit from skilled therapeutic intervention in order to improve the following deficits and impairments:  Abnormal gait, Decreased activity tolerance, Decreased balance, Decreased range of motion, Decreased endurance, Decreased strength, Pain, Impaired perceived functional ability  Visit Diagnosis: Stiffness of left  knee, not elsewhere classified  Muscle weakness (generalized)     Problem List Patient Active Problem List   Diagnosis Date Noted  . Suspected infection of left prosthetic knee joint ([place]) 07/02/2018  . Medication monitoring encounter 07/02/2018  . Failed partial knee, left ([place]) 05/15/2018  . Nocturnal leg cramps 09/26/2014  . Foot drop, right 09/24/2014  . 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 Upper airway cough syndrome 09/10/2014  . Aortic valve disease 11/19/2013  . Essential hypertension 11/19/2013  . Esophageal reflux 11/19/2013  . Gout 11/19/2013  . Aortic root enlargement ([place]) 11/19/2013         Geraldine Solar PT, DPT  [place] [address] [place], [place], [address] Phone: [phone]   Fax:  [phone]  Name: Marvin Munoz MRN: [id number] Date of Birth: 1960/09/25

## 2018-08-09 ENCOUNTER — Ambulatory Visit (HOSPITAL_COMMUNITY): Payer: 59

## 2018-08-09 ENCOUNTER — Encounter (HOSPITAL_COMMUNITY): Payer: Self-pay

## 2018-08-09 DIAGNOSIS — M25662 Stiffness of left knee, not elsewhere classified: Secondary | ICD-10-CM

## 2018-08-09 DIAGNOSIS — M6281 Muscle weakness (generalized): Secondary | ICD-10-CM

## 2018-08-09 NOTE — Therapy (Signed)
[place], [place], [address] Phone: [phone]   Fax:  [phone]  Physical Therapy Treatment  Patient Details  Name: Marvin Munoz MRN: [id number] Date of Birth: 10-10-1960 Referring Provider (PT): Marchia Bond   Encounter Date: 08/09/2018  PT End of Session - 08/09/18 0809    Visit Number  12    Number of Visits  18    Date for PT Re-Evaluation  08/30/18    Authorization Type  UHC    Authorization Time Period  1/20-->08/06/2018; 08/02/18 to 08/30/18    Authorization - Visit Number  12    Authorization - Number of Visits  60    PT Start Time  0813    PT Stop Time  3710    PT Time Calculation (min)  44 min    Activity Tolerance  Patient tolerated treatment well    Behavior During Therapy  [facility] for tasks assessed/performed       Past Medical History:  Diagnosis Date  . Allergic rhinitis   . Eczema   . Fever blister    occasional  . Foot drop, right 09/24/2014  . Gastroesophageal reflux   . Gout    05-09-2018 per pt last flare up, 2014  . Guillain Barr syndrome ([place]) 1970's   per pt received swine flu vaccination and regular flu vaccination at same time, was told one or the other may have caused guillain barre  . HTN (hypertension)   . Migraine headache   . Mild aortic valve stenosis cardiologist-  dr Daneen Schick   per last echo  02-01-2017   moderately thickended and calcificed leaflets with mild AS and mild AR,  valve area 2.43cm^2  . 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 Nocturia   . Nocturnal leg cramps   . OA (osteoarthritis)    back,  left knee  . PONV (postoperative nausea and vomiting)   . PVC's (premature ventricular contractions)    hx of benign  . Restless legs syndrome (RLS)   . Thoracic ascending aortic aneurysm [facility])    followed by dr h. Tamala Julian--- per last CTA 02-21-2018  , 57m    Past Surgical History:  Procedure Laterality Date  . BREAST LUMPECTOMY Right 1990s   "benign"  . CARDIAC CATHETERIZATION  09/10/2015   dr hBonnita Nasutipreston  _0    normal coronaries and LVF,  false positive cardiolite  . COLONOSCOPY    . KNEE ARTHROSCOPY WITH MEDIAL MENISECTOMY Left 05/15/2018   Procedure: LEFT KNEE ARTHROSCOPY WITH MEDIAL MENISECTOMY;  Surgeon: LMarchia Bond MD;  [facility];  Service: Orthopedics;  Laterality: Left;  . REPLACEMENT UNICONDYLAR JOINT KNEE Left 09/10/2015    dr lMardelle Matte _1   . TENDON RECONSTRUCTION Right 06/02/2016   Procedure: right elbow extensor tendon repair and debridement and repair lateral collateral ligament;  Surgeon: DNinetta Lights MD;  [place] [facility]  Service: Orthopedics;  Laterality: Right;  . TONSILLECTOMY  child  . TOTAL KNEE ARTHROPLASTY WITH REVISION COMPONENTS Left 05/15/2018   Procedure: conversion to LEFT total KNEE ARTHROPLASTY with hardware removal;  Surgeon: LMarchia Bond MD;  [facility];  Service: Orthopedics;  Laterality: Left;  with block  . TRANSTHORACIC ECHOCARDIOGRAM  02/01/2017   mild LVH,  ef 662-69% Grade 2 diastolic dysfucntion/  moderately thickened moderately calcified AV leafleat with mild stenosis and moderate regurg. (  valve area 2.43cm^2)/  mild to moderate calcified MV annulus with triv. regurg/ trivial TR/  m;ild RAE    There were no vitals filed for this visit.  Subjective Assessment - 08/09/18 0811    Subjective  Pt reports that he was sore following last session but overall no increases in pain.     Pertinent History  OA     Limitations  Standing;Walking    Patient Stated Goals  to walk without an assistive device, be able to walk and stand longer,     Currently in Pain?  Yes    Pain Score  3     Pain Location  Knee    Pain Orientation  Left    Pain Descriptors / Indicators  Aching;Sore    Pain Type  Chronic pain    Pain Onset  More than a month ago    Pain  Frequency  Constant    Aggravating Factors   when he first gets moving    Pain Relieving Factors  ice, rest, medication    Effect of Pain on Daily Activities  limited some           [facility] Adult PT Treatment/Exercise - 08/09/18 0001      Knee/Hip Exercises: Stretches   Active Hamstring Stretch  Left;3 reps;30 seconds    Active Hamstring Stretch Limitations  12in step with slight over pressure    Knee: Self-Stretch to increase Flexion  Left;5 reps;10 seconds    Knee: Self-Stretch Limitations  12" step knee drives    Gastroc Stretch  Left;3 reps;30 seconds    Gastroc Stretch Limitations  slant board      Knee/Hip Exercises: Machines for Strengthening   Cybex Knee Extension  Lt LE only, 20#, 10x3" holds    Cybex Knee Flexion  Lt LE only, 4 plates 12x5" holds    Total Gym Leg Press  leg press BLE concentric, LLE eccentric 50# x12 reps      Knee/Hip Exercises: Standing   Hip Abduction  Both;2 sets;10 reps    Abduction Limitations  BTB    Wall Squat Limitations  x12reps, min cues to properly weight shift over LLE, demo'd understanding    Walking with Sports Cord  fwd/retro tandem gait  foam x3RT    Other Standing Knee Exercises  sidestepping BTB 79f x3RT    Other Standing Knee Exercises  bil RDLs 15# 2x15 reps      Knee/Hip Exercises: Supine   Knee Extension Limitations  2    Knee Flexion Limitations  120      Manual Therapy   Manual Therapy  Soft tissue mobilization    Manual therapy comments  separate rest of treatment    Soft tissue mobilization  STM to vastus lateralis on Lt knee to address paina nd myofascial restrictions and pain           PT Education - 08/09/18 0810    Education Details  exercise technique, updated HEP    Person(s) Educated  Patient    Methods  Explanation;Demonstration    Comprehension  Verbalized understanding;Returned demonstration       PT Short Term Goals - 08/02/18 0818      PT SHORT TERM GOAL #1   Title  PT Lt knee flexion to be to  115 to allow pt to squat easily to pick items off of the floor.     Baseline  2/13: 117deg    Time  2    Period  Weeks  Status  Achieved      PT SHORT TERM GOAL #2   Title  Pt Lt knee extension to be no greater than five degrees to improve pt gait.     Baseline  2/13: 2deg    Time  2    Period  Weeks    Status  Achieved      PT SHORT TERM GOAL #3   Title  PT Lt LE strength to be increased by 1/2 grade to be able to come from sit to stand from a low lying sofa without difficulty     Baseline  2/13: see MMT    Time  2    Period  Weeks    Status  Partially Met        PT Long Term Goals - 08/02/18 0819      PT LONG TERM GOAL #1   Title  PT Lt knee flexion to be to 120 to allow pt fo be able to stand from the ground to work in his yard.     Baseline  2/13: 117deg    Time  4    Period  Weeks    Status  On-going      PT LONG TERM GOAL #2   Title  Pt Lt knee extension to be 3 or less degrees to have a normal gait     Baseline  2/13: 2 deg    Time  4    Period  Weeks    Status  Achieved      PT LONG TERM GOAL #3   Title  PT Lt LE strength to be increased one grade to allow pt to go up and down 12 steps in a reciprocal manner without difficulty.     Baseline  2/13: see MMT, still having difficulty descending stairs    Time  4    Period  Weeks    Status  Partially Met      PT LONG TERM GOAL #4   Title  Pt will report being able to walk for 2 hours or > with 3/10 L knee pain or < in order to demo improved tolerance to WB and maximize his work duties.     Time  4    Period  Weeks    Status  New    Target Date  08/30/18      PT LONG TERM GOAL #5   Title  Pt will report being able to ascend and descend a full flight of stairs in reciprocal manner confidently with 3/10 L knee pain or < to demo improved functional strength and maximize HH and community access.     Time  4    Period  Weeks    Status  New            Plan - 08/09/18 8119    Clinical Impression  Statement  Continued with established POC focusing on functional strength, machine strengthening, and overall balance. Updated HEP this date. Min cues for form throughout therex and pt continues to be challenged with eccentric leg press on LLE. Progressed him to tandem gait on foam this date which was slightly more challenging to pt. Continued with posterior chain strengthening to help reduce HS pain and improve overall strength.  AROM continues to look good at 2 to 120deg this date. continue as planned, progressing as able.     Rehab Potential  Good    PT Frequency  2x / week    PT  Duration  4 weeks    PT Treatment/Interventions  ADLs/Self Care Home Management;Stair training;Gait training;Therapeutic activities;Passive range of motion;Manual techniques;Therapeutic exercise;Balance training;Patient/family education;Dry needling;Taping;Neuromuscular re-education;Cryotherapy;Electrical Stimulation;Ultrasound;Functional mobility training;Orthotic Fit/Training;Scar mobilization;Joint Manipulations    PT Next Visit Plan  Continue machines for single leg strengthening. Continue STM to lateral HS and quadriceps for pain relief. Continue progress with ROM and strength as able.     PT Home Exercise Plan  supine hamstring stretch, single leg heelraise, sit to stand, towel extension; 1/23: standing hamstring stretch and TKE; 2/20: wall squats, hip abd BTB, sidestepping BTB, SLS vectors    Consulted and Agree with Plan of Care  Patient       Patient will benefit from skilled therapeutic intervention in order to improve the following deficits and impairments:  Abnormal gait, Decreased activity tolerance, Decreased balance, Decreased range of motion, Decreased endurance, Decreased strength, Pain, Impaired perceived functional ability  Visit Diagnosis: Stiffness of left knee, not elsewhere classified  Muscle weakness (generalized)     Problem List Patient Active Problem List   Diagnosis Date Noted  .  Suspected infection of left prosthetic knee joint ([place]) 07/02/2018  . Medication monitoring encounter 07/02/2018  . Failed partial knee, left ([place]) 05/15/2018  . Nocturnal leg cramps 09/26/2014  . Foot drop, right 09/24/2014  . 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 Upper airway cough syndrome 09/10/2014  . Aortic valve disease 11/19/2013  . Essential hypertension 11/19/2013  . Esophageal reflux 11/19/2013  . Gout 11/19/2013  . Aortic root enlargement ([place]) 11/19/2013        Geraldine Solar PT, DPT  [facility] [address] [place], [place], [address] Phone: [phone]   Fax:  [phone]  Name: Marvin Munoz MRN: [id number] Date of Birth: Dec 11, 1960

## 2018-08-14 ENCOUNTER — Encounter (HOSPITAL_COMMUNITY): Payer: Self-pay

## 2018-08-14 ENCOUNTER — Ambulatory Visit (HOSPITAL_COMMUNITY): Payer: 59

## 2018-08-14 DIAGNOSIS — M25662 Stiffness of left knee, not elsewhere classified: Secondary | ICD-10-CM

## 2018-08-14 DIAGNOSIS — M6281 Muscle weakness (generalized): Secondary | ICD-10-CM

## 2018-08-14 NOTE — Therapy (Signed)
[place], [place], [address] Phone: [phone]   Fax:  [phone]  Physical Therapy Treatment  Patient Details  Name: Marvin Munoz MRN: [id number] Date of Birth: 05/06/1961 Referring Provider (PT): Marchia Bond   Encounter Date: 08/14/2018  PT End of Session - 08/14/18 0808    Visit Number  13    Number of Visits  18    Date for PT Re-Evaluation  08/30/18    Authorization Type  [employer]    Authorization Time Period  1/20-->08/06/2018; 08/02/18 to 08/30/18    Authorization - Visit Number  13    Authorization - Number of Visits  60    PT Start Time  0815    PT Stop Time  1660    PT Time Calculation (min)  42 min    Activity Tolerance  Patient tolerated treatment well    Behavior During Therapy  [facility] for tasks assessed/performed       Past Medical History:  Diagnosis Date  . Allergic rhinitis   . Eczema   . Fever blister    occasional  . Foot drop, right 09/24/2014  . Gastroesophageal reflux   . Gout    05-09-2018 per pt last flare up, 2014  . Guillain Barr syndrome ([place]) 1970's   per pt received swine flu vaccination and regular flu vaccination at same time, was told one or the other may have caused guillain barre  . HTN (hypertension)   . Migraine headache   . Mild aortic valve stenosis cardiologist-  dr Daneen Schick   per last echo  02-01-2017   moderately thickended and calcificed leaflets with mild AS and mild AR,  valve area 2.43cm^2  . 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 Nocturia   . Nocturnal leg cramps   . OA (osteoarthritis)    back,  left knee  . PONV (postoperative nausea and vomiting)   . PVC's (premature ventricular contractions)    hx of benign  . Restless legs syndrome (RLS)   . Thoracic ascending aortic aneurysm [facility])    followed by dr h. Tamala Julian--- per last CTA 02-21-2018  , 88m    Past Surgical History:  Procedure Laterality Date  . BREAST LUMPECTOMY Right 1990s   "benign"  . CARDIAC CATHETERIZATION  09/10/2015   dr hBonnita Nasutipreston  '@[facility]    normal coronaries and LVF,  false positive cardiolite  . COLONOSCOPY    . KNEE ARTHROSCOPY WITH MEDIAL MENISECTOMY Left 05/15/2018   Procedure: LEFT KNEE ARTHROSCOPY WITH MEDIAL MENISECTOMY;  Surgeon: LMarchia Bond MD;  Location: [facility];  Service: Orthopedics;  Laterality: Left;  . REPLACEMENT UNICONDYLAR JOINT KNEE Left 09/10/2015    dr lMardelle Matte '@[facility]   . TENDON RECONSTRUCTION Right 06/02/2016   Procedure: right elbow extensor tendon repair and debridement and repair lateral collateral ligament;  Surgeon: DNinetta Lights MD;  [facility]  Service: Orthopedics;  Laterality: Right;  . TONSILLECTOMY  child  . TOTAL KNEE ARTHROPLASTY WITH REVISION COMPONENTS Left 05/15/2018   Procedure: conversion to LEFT total KNEE ARTHROPLASTY with hardware removal;  Surgeon: LMarchia Bond MD;  Location: [facility];  Service: Orthopedics;  Laterality: Left;  with block  . TRANSTHORACIC ECHOCARDIOGRAM  02/01/2017   mild LVH,  ef 663-01% Grade 2 diastolic dysfucntion/  moderately thickened moderately calcified AV leafleat with mild stenosis and moderate regurg. (  valve area 2.43cm^2)/  mild to moderate calcified MV annulus with triv. regurg/ trivial TR/  m;ild RAE    There were no vitals filed for this visit.  Subjective Assessment - 08/14/18 0814    Subjective  Pt reports that he has been hurting on the top of his knee, the side, and his calves have been sore. He states that it has been pain, not just soreness and had to take a pain pill again.     Pertinent History  OA     Limitations  Standing;Walking    Patient Stated Goals  to walk without an assistive device, be able to walk and stand longer,     Currently in Pain?  Yes    Pain Score  6     Pain Location  Knee    Pain Orientation  Left    Pain Descriptors /  Indicators  Aching;Sore    Pain Type  Chronic pain    Pain Onset  More than a month ago    Pain Frequency  Constant    Aggravating Factors   when he first gets moving    Pain Relieving Factors  ice, rest, medication    Effect of Pain on Daily Activities  limited some             [facility] Adult PT Treatment/Exercise - 08/14/18 0001      Knee/Hip Exercises: Stretches   Active Hamstring Stretch  Left;3 reps;30 seconds    Active Hamstring Stretch Limitations  12in step with slight over pressure    Knee: Self-Stretch to increase Flexion  Left;5 reps;10 seconds    Knee: Self-Stretch Limitations  12" step knee drives    Gastroc Stretch  Left;3 reps;30 seconds    Gastroc Stretch Limitations  slant board      Knee/Hip Exercises: Standing   Forward Lunges  Both;10 reps    Forward Lunges Limitations  rear foot elevated on 6" step    Hip Extension  Both;2 sets;10 reps    Extension Limitations  BTB, diagonals    Functional Squat  10 reps    Functional Squat Limitations  chair taps    Rocker Board Limitations  bil tandem stance foam +pallof press GTB x15 reps each    Gait Training  fwd/retro monster walks BTB x3RT    Other Standing Knee Exercises  sidestepping BTB 23f x3RT      Knee/Hip Exercises: Supine   Knee Extension Limitations  2    Knee Flexion Limitations  120      Manual Therapy   Manual Therapy  Soft tissue mobilization    Manual therapy comments  separate rest of treatment    Soft tissue mobilization  STM to distal quad and lateral joint to reduce restrictions and pain             PT Education - 08/14/18 0810    Education Details  exercise technique, continue HEP    Person(s) Educated  Patient    Methods  Explanation;Demonstration    Comprehension  Verbalized understanding;Returned demonstration       PT Short Term Goals - 08/02/18 0818      PT SHORT TERM GOAL #1   Title  PT Lt knee flexion to be to 115 to allow pt to squat easily to pick items off of the  floor.     Baseline  2/13: 117deg    Time  2    Period  Weeks    Status  Achieved  PT SHORT TERM GOAL #2   Title  Pt Lt knee extension to be no greater than five degrees to improve pt gait.     Baseline  2/13: 2deg    Time  2    Period  Weeks    Status  Achieved      PT SHORT TERM GOAL #3   Title  PT Lt LE strength to be increased by 1/2 grade to be able to come from sit to stand from a low lying sofa without difficulty     Baseline  2/13: see MMT    Time  2    Period  Weeks    Status  Partially Met        PT Long Term Goals - 08/02/18 0819      PT LONG TERM GOAL #1   Title  PT Lt knee flexion to be to 120 to allow pt fo be able to stand from the ground to work in his yard.     Baseline  2/13: 117deg    Time  4    Period  Weeks    Status  On-going      PT LONG TERM GOAL #2   Title  Pt Lt knee extension to be 3 or less degrees to have a normal gait     Baseline  2/13: 2 deg    Time  4    Period  Weeks    Status  Achieved      PT LONG TERM GOAL #3   Title  PT Lt LE strength to be increased one grade to allow pt to go up and down 12 steps in a reciprocal manner without difficulty.     Baseline  2/13: see MMT, still having difficulty descending stairs    Time  4    Period  Weeks    Status  Partially Met      PT LONG TERM GOAL #4   Title  Pt will report being able to walk for 2 hours or > with 3/10 L knee pain or < in order to demo improved tolerance to WB and maximize his work duties.     Time  4    Period  Weeks    Status  New    Target Date  08/30/18      PT LONG TERM GOAL #5   Title  Pt will report being able to ascend and descend a full flight of stairs in reciprocal manner confidently with 3/10 L knee pain or < to demo improved functional strength and maximize HH and community access.     Time  4    Period  Weeks    Status  New            Plan - 08/14/18 8299    Clinical Impression Statement  Pt presents to therapy stating that he has been in  increased pain over the weekend. He's not sure if it is due to the weights that he's been doing in therapy or what, but he had to go back on a pain pill it was so bad. Due to reports of this, regressed POC some this date to assess his response. Held on machine strengthening this date and focused mainly on functional strength, balance, and ended with manual for pain control. Pt tolerated session well, not reporting any increased pain out of the usual lateral knee joint pain that he has been c/o for a few weeks. AROM holding steady at 2  to 120deg at EOS. Continue as planned, progressing slowly as able.     Rehab Potential  Good    PT Frequency  2x / week    PT Duration  4 weeks    PT Treatment/Interventions  ADLs/Self Care Home Management;Stair training;Gait training;Therapeutic activities;Passive range of motion;Manual techniques;Therapeutic exercise;Balance training;Patient/family education;Dry needling;Taping;Neuromuscular re-education;Cryotherapy;Electrical Stimulation;Ultrasound;Functional mobility training;Orthotic Fit/Training;Scar mobilization;Joint Manipulations    PT Next Visit Plan  f/u on knee pain; hold machines for now to assess response; Continue machines for single leg strengthening. Continue STM to lateral HS and quadriceps for pain relief. Continue progress with ROM and strength as able.     PT Home Exercise Plan  supine hamstring stretch, single leg heelraise, sit to stand, towel extension; 1/23: standing hamstring stretch and TKE; 2/20: wall squats, hip abd BTB, sidestepping BTB, SLS vectors    Consulted and Agree with Plan of Care  Patient       Patient will benefit from skilled therapeutic intervention in order to improve the following deficits and impairments:  Abnormal gait, Decreased activity tolerance, Decreased balance, Decreased range of motion, Decreased endurance, Decreased strength, Pain, Impaired perceived functional ability  Visit Diagnosis: Stiffness of left knee, not  elsewhere classified  Muscle weakness (generalized)     Problem List Patient Active Problem List   Diagnosis Date Noted  . Suspected infection of left prosthetic knee joint ([place]) 07/02/2018  . Medication monitoring encounter 07/02/2018  . Failed partial knee, left ([place]) 05/15/2018  . Nocturnal leg cramps 09/26/2014  . Foot drop, right 09/24/2014  . 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 Upper airway cough syndrome 09/10/2014  . Aortic valve disease 11/19/2013  . Essential hypertension 11/19/2013  . Esophageal reflux 11/19/2013  . Gout 11/19/2013  . Aortic root enlargement ([place]) 11/19/2013        Geraldine Solar PT, DPT  [facility] [address] [place], [place], [address] Phone: [phone]   Fax:  [phone]  Name: Marvin Munoz MRN: [id number] Date of Birth: 11/28/1960

## 2018-08-21 ENCOUNTER — Encounter (HOSPITAL_COMMUNITY): Payer: Self-pay

## 2018-08-21 ENCOUNTER — Ambulatory Visit (HOSPITAL_COMMUNITY): Payer: 59 | Attending: Internal Medicine

## 2018-08-21 DIAGNOSIS — M25662 Stiffness of left knee, not elsewhere classified: Secondary | ICD-10-CM | POA: Diagnosis not present

## 2018-08-21 DIAGNOSIS — M6281 Muscle weakness (generalized): Secondary | ICD-10-CM | POA: Diagnosis present

## 2018-08-21 NOTE — Therapy (Signed)
[place], [place], [address] Phone: [phone]   Fax:  [phone]  Physical Therapy Treatment  Patient Details  Name: Marvin Munoz MRN: [id number] Date of Birth: July 21, 1960 Referring Provider (PT): Marchia Bond   Encounter Date: 08/21/2018  PT End of Session - 08/21/18 0900    Visit Number  14    Number of Visits  18    Date for PT Re-Evaluation  08/30/18    Authorization Type  U[employer]    Authorization Time Period  1/20-->08/06/2018; 08/02/18 to 08/30/18    Authorization - Visit Number  14    Authorization - Number of Visits  60    PT Start Time  0900    PT Stop Time  0940    PT Time Calculation (min)  40 min    Activity Tolerance  Patient tolerated treatment well    Behavior During Therapy  [facility] for tasks assessed/performed       Past Medical History:  Diagnosis Date  . Allergic rhinitis   . Eczema   . Fever blister    occasional  . Foot drop, right 09/24/2014  . Gastroesophageal reflux   . Gout    05-09-2018 per pt last flare up, 2014  . Guillain Barr syndrome ([place]) 1970's   per pt received swine flu vaccination and regular flu vaccination at same time, was told one or the other may have caused guillain barre  . HTN (hypertension)   . Migraine headache   . Mild aortic valve stenosis cardiologist-  dr Daneen Schick   per last echo  02-01-2017   moderately thickended and calcificed leaflets with mild AS and mild AR,  valve area 2.43cm^2  . 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 Nocturia   . Nocturnal leg cramps   . OA (osteoarthritis)    back,  left knee  . PONV (postoperative nausea and vomiting)   . PVC's (premature ventricular contractions)    hx of benign  . Restless legs syndrome (RLS)   . Thoracic ascending aortic aneurysm [facility])    followed by dr h. Tamala Julian--- per last CTA 02-21-2018  , 3m    Past Surgical History:  Procedure Laterality Date  . BREAST LUMPECTOMY Right 1990s   "benign"  . CARDIAC CATHETERIZATION  09/10/2015   dr hBonnita Nasutipreston  _0    normal coronaries and LVF,  false positive cardiolite  . COLONOSCOPY    . KNEE ARTHROSCOPY WITH MEDIAL MENISECTOMY Left 05/15/2018   Procedure: LEFT KNEE ARTHROSCOPY WITH MEDIAL MENISECTOMY;  Surgeon: LMarchia Bond MD;  [facility];  Service: Orthopedics;  Laterality: Left;  . REPLACEMENT UNICONDYLAR JOINT KNEE Left 09/10/2015    dr lMardelle Matte _1   . TENDON RECONSTRUCTION Right 06/02/2016   Procedure: right elbow extensor tendon repair and debridement and repair lateral collateral ligament;  Surgeon: DNinetta Lights MD;  [facility]  Service: Orthopedics;  Laterality: Right;  . TONSILLECTOMY  child  . TOTAL KNEE ARTHROPLASTY WITH REVISION COMPONENTS Left 05/15/2018   Procedure: conversion to LEFT total KNEE ARTHROPLASTY with hardware removal;  Surgeon: LMarchia Bond MD;  [facility];  Service: Orthopedics;  Laterality: Left;  with block  . TRANSTHORACIC ECHOCARDIOGRAM  02/01/2017   mild LVH,  ef 643-32% Grade 2 diastolic dysfucntion/  moderately thickened moderately calcified AV leafleat with mild stenosis and moderate regurg. (  valve area 2.43cm^2)/  mild to moderate calcified MV annulus with triv. regurg/ trivial TR/  m;ild RAE    There were no vitals filed for this visit.  Subjective Assessment - 08/21/18 0901    Subjective  Pt states that his pain is not too bad today. He did some good walking and biking between his last session and today. Still lateral knee pain but not as bad.     Pertinent History  OA     Limitations  Standing;Walking    Patient Stated Goals  to walk without an assistive device, be able to walk and stand longer,     Currently in Pain?  Yes    Pain Score  4     Pain Location  Knee    Pain Orientation  Left    Pain Descriptors / Indicators  Aching;Sore     Pain Type  Chronic pain    Pain Onset  More than a month ago    Pain Frequency  Constant    Aggravating Factors   when he first gets moving     Pain Relieving Factors  ice, rest, medication    Effect of Pain on Daily Activities  limited some             [facility] Adult PT Treatment/Exercise - 08/21/18 0001      Knee/Hip Exercises: Stretches   Active Hamstring Stretch  Left;3 reps;30 seconds    Active Hamstring Stretch Limitations  12in step with slight over pressure    Knee: Self-Stretch to increase Flexion  Left;5 reps;10 seconds    Knee: Self-Stretch Limitations  12" step knee drives    Gastroc Stretch  Left;3 reps;30 seconds    Gastroc Stretch Limitations  slant board      Knee/Hip Exercises: Standing   Forward Lunges  Both;2 sets;10 reps    Forward Lunges Limitations  rear foot elevated on 6" step    Hip Extension  Both;2 sets;10 reps    Extension Limitations  BTB, diagonals    Step Down  Left;2 sets;10 reps;Step Height: 8"    Step Down Limitations  lateral heel taps    Rebounder  bil tandem stance on 1/2 foam roll upside down 3x30" holds each    Walking with Sports Cord  bil tandem stance 1/2 foam roll upside down +UE flexion with 5# weight bar x10 reps each    Gait Training  fwd/retro monster walks BTB x3RT      Manual Therapy   Manual Therapy  Soft tissue mobilization;Joint mobilization    Manual therapy comments  separate rest of treatment    Joint Mobilization  Grade IV AP/PA tibiofemoral joint mobs for knee extension and flexion ROM    Soft tissue mobilization  STM to distal quad, distal HS, and lateral joint to reduce restrictions and pain             PT Education - 08/21/18 0901    Education Details  exercise technique, continue HEP    Person(s) Educated  Patient    Methods  Explanation;Demonstration    Comprehension  Verbalized understanding;Returned demonstration       PT Short Term Goals - 08/02/18 0818      PT SHORT TERM GOAL #1   Title  PT Lt  knee flexion to be to 115 to allow pt to squat easily to pick items off of the floor.     Baseline  2/13: 117deg    Time  2    Period  Weeks    Status  Achieved      PT SHORT TERM GOAL #2   Title  Pt Lt knee extension to be no greater than five degrees to improve pt gait.     Baseline  2/13: 2deg    Time  2    Period  Weeks    Status  Achieved      PT SHORT TERM GOAL #3   Title  PT Lt LE strength to be increased by 1/2 grade to be able to come from sit to stand from a low lying sofa without difficulty     Baseline  2/13: see MMT    Time  2    Period  Weeks    Status  Partially Met        PT Long Term Goals - 08/02/18 0819      PT LONG TERM GOAL #1   Title  PT Lt knee flexion to be to 120 to allow pt fo be able to stand from the ground to work in his yard.     Baseline  2/13: 117deg    Time  4    Period  Weeks    Status  On-going      PT LONG TERM GOAL #2   Title  Pt Lt knee extension to be 3 or less degrees to have a normal gait     Baseline  2/13: 2 deg    Time  4    Period  Weeks    Status  Achieved      PT LONG TERM GOAL #3   Title  PT Lt LE strength to be increased one grade to allow pt to go up and down 12 steps in a reciprocal manner without difficulty.     Baseline  2/13: see MMT, still having difficulty descending stairs    Time  4    Period  Weeks    Status  Partially Met      PT LONG TERM GOAL #4   Title  Pt will report being able to walk for 2 hours or > with 3/10 L knee pain or < in order to demo improved tolerance to WB and maximize his work duties.     Time  4    Period  Weeks    Status  New    Target Date  08/30/18      PT LONG TERM GOAL #5   Title  Pt will report being able to ascend and descend a full flight of stairs in reciprocal manner confidently with 3/10 L knee pain or < to demo improved functional strength and maximize HH and community access.     Time  4    Period  Weeks    Status  New            Plan - 08/21/18 9629     Clinical Impression Statement  Continued with established POC focusing on improving overall strength, balance, and pain. Performed all therex within pt's pain tolerance. Progressed his balance this date by having him perform tandem stance on 1/2 foam roll upside down; pt was challenged with this but no LOB occurred. Also added heel taps on 8" step with pt reported feeling in quads appropriately. Ended with manual for joint mobs and STM for pain control. Restrictions in quad reducing nicely. AROM 2-122deg this date. No increased pain at EOS.    Rehab Potential  Good    PT Frequency  2x / week  PT Duration  4 weeks    PT Treatment/Interventions  ADLs/Self Care Home Management;Stair training;Gait training;Therapeutic activities;Passive range of motion;Manual techniques;Therapeutic exercise;Balance training;Patient/family education;Dry needling;Taping;Neuromuscular re-education;Cryotherapy;Electrical Stimulation;Ultrasound;Functional mobility training;Orthotic Fit/Training;Scar mobilization;Joint Manipulations    PT Next Visit Plan  add in resisted walking at multigym; hold machines for now to assess response; Continue machines for single leg strengthening. Continue STM to lateral HS and quadriceps for pain relief. Continue progress with ROM and strength as able.     PT Home Exercise Plan  supine hamstring stretch, single leg heelraise, sit to stand, towel extension; 1/23: standing hamstring stretch and TKE; 2/20: wall squats, hip abd BTB, sidestepping BTB, SLS vectors; 3/3: LAQ with weights    Consulted and Agree with Plan of Care  Patient       Patient will benefit from skilled therapeutic intervention in order to improve the following deficits and impairments:  Abnormal gait, Decreased activity tolerance, Decreased balance, Decreased range of motion, Decreased endurance, Decreased strength, Pain, Impaired perceived functional ability  Visit Diagnosis: Stiffness of left knee, not elsewhere  classified  Muscle weakness (generalized)     Problem List Patient Active Problem List   Diagnosis Date Noted  . Suspected infection of left prosthetic knee joint ([place]) 07/02/2018  . Medication monitoring encounter 07/02/2018  . Failed partial knee, left ([place]) 05/15/2018  . Nocturnal leg cramps 09/26/2014  . Foot drop, right 09/24/2014  . 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 Upper airway cough syndrome 09/10/2014  . Aortic valve disease 11/19/2013  . Essential hypertension 11/19/2013  . Esophageal reflux 11/19/2013  . Gout 11/19/2013  . Aortic root enlargement ([place]) 11/19/2013       Geraldine Solar PT, DPT  [facility] [address] [place], [place], [address] Phone: [phone]   Fax:  [phone]  Name: Marvin Munoz MRN: [id number] Date of Birth: 1961/01/18

## 2018-08-23 ENCOUNTER — Encounter (HOSPITAL_COMMUNITY): Payer: Self-pay

## 2018-08-23 ENCOUNTER — Ambulatory Visit (HOSPITAL_COMMUNITY): Payer: 59

## 2018-08-23 DIAGNOSIS — M6281 Muscle weakness (generalized): Secondary | ICD-10-CM

## 2018-08-23 DIAGNOSIS — M25662 Stiffness of left knee, not elsewhere classified: Secondary | ICD-10-CM | POA: Diagnosis not present

## 2018-08-23 NOTE — Therapy (Signed)
[place], [place], [address] Phone: [phone]   Fax:  [phone]  Physical Therapy Treatment  Patient Details  Name: Marvin Munoz MRN: [id number] Date of Birth: 07/25/1960 Referring Provider (PT): Marchia Bond   Encounter Date: 08/23/2018  PT End of Session - 08/23/18 1030    Visit Number  15    Number of Visits  18    Date for PT Re-Evaluation  08/30/18    Authorization Type  U[employer]    Authorization Time Period  1/20-->08/06/2018; 08/02/18 to 08/30/18    Authorization - Visit Number  15    Authorization - Number of Visits  60    PT Start Time  3833    PT Stop Time  1111    PT Time Calculation (min)  41 min    Activity Tolerance  Patient tolerated treatment well    Behavior During Therapy  [facility] for tasks assessed/performed       Past Medical History:  Diagnosis Date  . Allergic rhinitis   . Eczema   . Fever blister    occasional  . Foot drop, right 09/24/2014  . Gastroesophageal reflux   . Gout    05-09-2018 per pt last flare up, 2014  . Guillain Barr syndrome ([place]) 1970's   per pt received swine flu vaccination and regular flu vaccination at same time, was told one or the other may have caused guillain barre  . HTN (hypertension)   . Migraine headache   . Mild aortic valve stenosis cardiologist-  dr Daneen Schick   per last echo  02-01-2017   moderately thickended and calcificed leaflets with mild AS and mild AR,  valve area 2.43cm^2  . 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 Nocturia   . Nocturnal leg cramps   . OA (osteoarthritis)    back,  left knee  . PONV (postoperative nausea and vomiting)   . PVC's (premature ventricular contractions)    hx of benign  . Restless legs syndrome (RLS)   . Thoracic ascending aortic aneurysm [facility])    followed by dr h. Tamala Julian--- per last CTA 02-21-2018  , 77m    Past Surgical History:  Procedure Laterality Date  . BREAST LUMPECTOMY Right 1990s   "benign"  . CARDIAC CATHETERIZATION  09/10/2015   dr hBonnita Nasutipreston  _0    normal coronaries and LVF,  false positive cardiolite  . COLONOSCOPY    . KNEE ARTHROSCOPY WITH MEDIAL MENISECTOMY Left 05/15/2018   Procedure: LEFT KNEE ARTHROSCOPY WITH MEDIAL MENISECTOMY;  Surgeon: LMarchia Bond MD;  Location: [facility];  Service: Orthopedics;  Laterality: Left;  . REPLACEMENT UNICONDYLAR JOINT KNEE Left 09/10/2015    dr lMardelle Matte _1   . TENDON RECONSTRUCTION Right 06/02/2016   Procedure: right elbow extensor tendon repair and debridement and repair lateral collateral ligament;  Surgeon: DNinetta Lights MD;  [facility]  Service: Orthopedics;  Laterality: Right;  . TONSILLECTOMY  child  . TOTAL KNEE ARTHROPLASTY WITH REVISION COMPONENTS Left 05/15/2018   Procedure: conversion to LEFT total KNEE ARTHROPLASTY with hardware removal;  Surgeon: LMarchia Bond MD;  Location: [facility];  Service: Orthopedics;  Laterality: Left;  with block  . TRANSTHORACIC ECHOCARDIOGRAM  02/01/2017   mild LVH,  ef 638-32% Grade 2 diastolic dysfucntion/  moderately thickened moderately calcified AV leafleat with mild stenosis and moderate regurg. (  valve area 2.43cm^2)/  mild to moderate calcified MV annulus with triv. regurg/ trivial TR/  m;ild RAE    There were no vitals filed for this visit.  Subjective Assessment - 08/23/18 1030    Subjective  Pt states that he is doing well. No increased pain from last session but still about 4/10.     Pertinent History  OA     Limitations  Standing;Walking    Patient Stated Goals  to walk without an assistive device, be able to walk and stand longer,     Currently in Pain?  Yes    Pain Score  4     Pain Location  Knee    Pain Orientation  Left    Pain Descriptors / Indicators  Aching;Sore    Pain Type  Chronic pain    Pain Onset  More than a month ago     Pain Frequency  Constant    Aggravating Factors   when he first gets moving    Pain Relieving Factors  ice, rest, medication    Effect of Pain on Daily Activities  limited some           [facility] Adult PT Treatment/Exercise - 08/23/18 0001      Knee/Hip Exercises: Stretches   Active Hamstring Stretch  Left;3 reps;30 seconds    Active Hamstring Stretch Limitations  12in step with slight over pressure    Knee: Self-Stretch to increase Flexion  Left;5 reps;10 seconds    Knee: Self-Stretch Limitations  12" step knee drives    Gastroc Stretch  Left;3 reps;30 seconds    Gastroc Stretch Limitations  slant board      Knee/Hip Exercises: Machines for Strengthening   Hip Cybex  4-way SLR at multigym 30# x10 reps BLE    Other Machine  fwd/retro resisted walking at [employer], 50# x5RT      Knee/Hip Exercises: Standing   Heel Raises  Both;15 reps    Heel Raises Limitations  bil single leg heel raises, 1-2" holds    SLS  bil on 1/2 foam roll upside down 1x30" each    Rebounder  bil tandem stance on 1/2 foam roll upside down 3x30" holds each, EC intermittently    Walking with Sports Cord  bil tandem stance 1/2 foam roll upside down +pallof press GTB x15 reps each      Knee/Hip Exercises: Seated   Stool Scoot - Round Trips  fwd/retro x3RT      Knee/Hip Exercises: Supine   Knee Extension Limitations  2    Knee Flexion Limitations  123             PT Education - 08/23/18 1030    Education Details  exercise technique, continue HEP    Person(s) Educated  Patient    Methods  Explanation;Demonstration    Comprehension  Verbalized understanding;Returned demonstration       PT Short Term Goals - 08/02/18 0818      PT SHORT TERM GOAL #1   Title  PT Lt knee flexion to be to 115 to allow pt to squat easily to pick items off of the floor.     Baseline  2/13: 117deg    Time  2    Period  Weeks    Status  Achieved      PT SHORT TERM GOAL #2   Title  Pt Lt knee extension to be no greater  than five degrees to improve pt gait.     Baseline  2/13: 2deg    Time  2    Period  Weeks    Status  Achieved      PT SHORT TERM GOAL #3   Title  PT Lt LE strength to be increased by 1/2 grade to be able to come from sit to stand from a low lying sofa without difficulty     Baseline  2/13: see MMT    Time  2    Period  Weeks    Status  Partially Met        PT Long Term Goals - 08/02/18 0819      PT LONG TERM GOAL #1   Title  PT Lt knee flexion to be to 120 to allow pt fo be able to stand from the ground to work in his yard.     Baseline  2/13: 117deg    Time  4    Period  Weeks    Status  On-going      PT LONG TERM GOAL #2   Title  Pt Lt knee extension to be 3 or less degrees to have a normal gait     Baseline  2/13: 2 deg    Time  4    Period  Weeks    Status  Achieved      PT LONG TERM GOAL #3   Title  PT Lt LE strength to be increased one grade to allow pt to go up and down 12 steps in a reciprocal manner without difficulty.     Baseline  2/13: see MMT, still having difficulty descending stairs    Time  4    Period  Weeks    Status  Partially Met      PT LONG TERM GOAL #4   Title  Pt will report being able to walk for 2 hours or > with 3/10 L knee pain or < in order to demo improved tolerance to WB and maximize his work duties.     Time  4    Period  Weeks    Status  New    Target Date  08/30/18      PT LONG TERM GOAL #5   Title  Pt will report being able to ascend and descend a full flight of stairs in reciprocal manner confidently with 3/10 L knee pain or < to demo improved functional strength and maximize HH and community access.     Time  4    Period  Weeks    Status  New            Plan - 08/23/18 1114    Clinical Impression Statement  Continued with established POC focusing on overall strength and balance. PT slowly reintroducing machine strengthening for isolated hip and quad strengthening so added resisted walking which he tolerated well. Also  added 4-way SLR at machine for hip strengthening and stabilization. Began stool scoots and continued with balance work. Overall, pt tolerated session well, not reporting any increased pain. AROM 2 to 123deg this date. continue as planned, progressing as able.     Rehab Potential  Good    PT Frequency  2x / week    PT Duration  4 weeks    PT Treatment/Interventions  ADLs/Self Care Home Management;Stair training;Gait training;Therapeutic activities;Passive range of motion;Manual techniques;Therapeutic exercise;Balance training;Patient/family education;Dry needling;Taping;Neuromuscular re-education;Cryotherapy;Electrical Stimulation;Ultrasound;Functional mobility training;Orthotic Fit/Training;Scar mobilization;Joint Manipulations    PT Next Visit Plan  contiue to slowly perform machines; Continue machines for single leg strengthening.  Continue STM to lateral HS and quadriceps for pain relief. Continue progress with ROM and strength as able.     PT Home Exercise Plan  supine hamstring stretch, single leg heelraise, sit to stand, towel extension; 1/23: standing hamstring stretch and TKE; 2/20: wall squats, hip abd BTB, sidestepping BTB, SLS vectors; 3/3: LAQ with weights    Consulted and Agree with Plan of Care  Patient       Patient will benefit from skilled therapeutic intervention in order to improve the following deficits and impairments:  Abnormal gait, Decreased activity tolerance, Decreased balance, Decreased range of motion, Decreased endurance, Decreased strength, Pain, Impaired perceived functional ability  Visit Diagnosis: Stiffness of left knee, not elsewhere classified  Muscle weakness (generalized)     Problem List Patient Active Problem List   Diagnosis Date Noted  . Suspected infection of left prosthetic knee joint ([place]) 07/02/2018  . Medication monitoring encounter 07/02/2018  . Failed partial knee, left ([place]) 05/15/2018  . Nocturnal leg cramps 09/26/2014  . Foot drop, right  09/24/2014  . 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 Upper airway cough syndrome 09/10/2014  . Aortic valve disease 11/19/2013  . Essential hypertension 11/19/2013  . Esophageal reflux 11/19/2013  . Gout 11/19/2013  . Aortic root enlargement ([place]) 11/19/2013        Geraldine Solar PT, DPT   [facility] [place] [address] [place], [place], [address] Phone: [phone]   Fax:  [phone]  Name: Marvin Munoz MRN: [id number] Date of Birth: January 19, 1961

## 2018-08-28 ENCOUNTER — Ambulatory Visit (HOSPITAL_COMMUNITY): Payer: 59

## 2018-08-28 ENCOUNTER — Encounter (HOSPITAL_COMMUNITY): Payer: Self-pay

## 2018-08-28 DIAGNOSIS — M6281 Muscle weakness (generalized): Secondary | ICD-10-CM

## 2018-08-28 DIAGNOSIS — M25662 Stiffness of left knee, not elsewhere classified: Secondary | ICD-10-CM

## 2018-08-28 NOTE — Therapy (Signed)
[place], [place], [address] Phone: [phone]   Fax:  [phone]  Physical Therapy Treatment  Patient Details  Name: Marvin Munoz MRN: [id number] Date of Birth: 13-Sep-1960 Referring Provider (PT): Marchia Bond   Encounter Date: 08/28/2018  PT End of Session - 08/28/18 0906    Visit Number  16    Number of Visits  18    Date for PT Re-Evaluation  08/30/18    Authorization Type  UHC    Authorization Time Period  1/20-->08/06/2018; 08/02/18 to 08/30/18    Authorization - Visit Number  16    Authorization - Number of Visits  60    PT Start Time  0902    PT Stop Time  0945    PT Time Calculation (min)  43 min    Activity Tolerance  Patient tolerated treatment well    Behavior During Therapy  [facility] for tasks assessed/performed       Past Medical History:  Diagnosis Date  . Allergic rhinitis   . Eczema   . Fever blister    occasional  . Foot drop, right 09/24/2014  . Gastroesophageal reflux   . Gout    05-09-2018 per pt last flare up, 2014  . Guillain Barr syndrome ([place]) 1970's   per pt received swine flu vaccination and regular flu vaccination at same time, was told one or the other may have caused guillain barre  . HTN (hypertension)   . Migraine headache   . Mild aortic valve stenosis cardiologist-  dr Daneen Schick   per last echo  02-01-2017   moderately thickended and calcificed leaflets with mild AS and mild AR,  valve area 2.43cm^2  . 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 Nocturia   . Nocturnal leg cramps   . OA (osteoarthritis)    back,  left knee  . PONV (postoperative nausea and vomiting)   . PVC's (premature ventricular contractions)    hx of benign  . Restless legs syndrome (RLS)   . Thoracic ascending aortic aneurysm [facility])    followed by dr h. Tamala Julian--- per last CTA 02-21-2018  , 28m    Past Surgical History:  Procedure Laterality Date  . BREAST LUMPECTOMY Right 1990s   "benign"  . CARDIAC CATHETERIZATION  09/10/2015   dr hBonnita Nasutipreston  '@[facility]'$    normal coronaries and LVF,  false positive cardiolite  . COLONOSCOPY    . KNEE ARTHROSCOPY WITH MEDIAL MENISECTOMY Left 05/15/2018   Procedure: LEFT KNEE ARTHROSCOPY WITH MEDIAL MENISECTOMY;  Surgeon: LMarchia Bond MD;  [facility];  Service: Orthopedics;  Laterality: Left;  . REPLACEMENT UNICONDYLAR JOINT KNEE Left 09/10/2015    dr lMardelle Matte '@[facility]   . TENDON RECONSTRUCTION Right 06/02/2016   Procedure: right elbow extensor tendon repair and debridement and repair lateral collateral ligament;  Surgeon: DNinetta Lights MD;  [facility]  Service: Orthopedics;  Laterality: Right;  . TONSILLECTOMY  child  . TOTAL KNEE ARTHROPLASTY WITH REVISION COMPONENTS Left 05/15/2018   Procedure: conversion to LEFT total KNEE ARTHROPLASTY with hardware removal;  Surgeon: LMarchia Bond MD;  [facility];  Service: Orthopedics;  Laterality: Left;  with block  . TRANSTHORACIC ECHOCARDIOGRAM  02/01/2017   mild LVH,  ef 663-87% Grade 2 diastolic dysfucntion/  moderately thickened moderately calcified AV leafleat with mild stenosis and moderate regurg. (  valve area 2.43cm^2)/  mild to moderate calcified MV annulus with triv. regurg/ trivial TR/  m;ild RAE    There were no vitals filed for this visit.  Subjective Assessment - 08/28/18 0904    Subjective  Pt states that his knee pain is increased this date to about 6/10. He states that he did a little more over the weekend; two days of good walking.     Pertinent History  OA     Limitations  Standing;Walking    Patient Stated Goals  to walk without an assistive device, be able to walk and stand longer,     Currently in Pain?  Yes    Pain Score  6     Pain Location  Knee    Pain Orientation  Left    Pain Descriptors / Indicators  Aching;Sore    Pain Type   Chronic pain    Pain Onset  More than a month ago    Pain Frequency  Constant    Aggravating Factors   when he first first gets moving    Pain Relieving Factors  ice, rest, medication    Effect of Pain on Daily Activities  limited some         [facility] PT Assessment - 08/28/18 0001      Assessment   Medical Diagnosis  Lt TKR    Referring Provider (PT)  Marchia Bond    Onset Date/Surgical Date  05/16/18    Hand Dominance  Right    Next MD Visit  09/01/18           [facility] Adult PT Treatment/Exercise - 08/28/18 0001      Knee/Hip Exercises: Stretches   Active Hamstring Stretch  Left;3 reps;30 seconds    Active Hamstring Stretch Limitations  12in step with slight over pressure    Knee: Self-Stretch to increase Flexion  Left;3 reps;10 seconds    Knee: Self-Stretch Limitations  12" step knee drives    Gastroc Stretch  Left;3 reps;30 seconds    Gastroc Stretch Limitations  slant board      Knee/Hip Exercises: Machines for Strengthening   Cybex Knee Extension  BLE 20#, x10 reps    Other Machine  fwd/retro resisted walking at [employer], 50# x5RT      Knee/Hip Exercises: Standing   Heel Raises  Both;20 reps    Heel Raises Limitations  bil single leg heel raises, 1-2" holds    Step Down  Left;2 sets;10 reps;Step Height: 8"    Step Down Limitations  lateral heel taps    Other Standing Knee Exercises  bil star drill foam x10RT each (cues to reduce valgus)    Other Standing Knee Exercises  bil airplanes GTB 2x10 reps each      Knee/Hip Exercises: Prone   Other Prone Exercises  prone TKE 2# for proprioception 10x5" holds      Manual Therapy   Manual Therapy  Soft tissue mobilization    Manual therapy comments  separate rest of treatment    Soft tissue mobilization  STM to L gastroc/soleus and medial/lateral HS to reduce restrictions and pain             PT Education - 08/28/18 0906    Education Details  will reassess next visit    Person(s) Educated  Patient    Methods   Demonstration;Explanation    Comprehension  Verbalized understanding;Returned demonstration       PT Short Term Goals - 08/02/18 0818  PT SHORT TERM GOAL #1   Title  PT Lt knee flexion to be to 115 to allow pt to squat easily to pick items off of the floor.     Baseline  2/13: 117deg    Time  2    Period  Weeks    Status  Achieved      PT SHORT TERM GOAL #2   Title  Pt Lt knee extension to be no greater than five degrees to improve pt gait.     Baseline  2/13: 2deg    Time  2    Period  Weeks    Status  Achieved      PT SHORT TERM GOAL #3   Title  PT Lt LE strength to be increased by 1/2 grade to be able to come from sit to stand from a low lying sofa without difficulty     Baseline  2/13: see MMT    Time  2    Period  Weeks    Status  Partially Met        PT Long Term Goals - 08/02/18 0819      PT LONG TERM GOAL #1   Title  PT Lt knee flexion to be to 120 to allow pt fo be able to stand from the ground to work in his yard.     Baseline  2/13: 117deg    Time  4    Period  Weeks    Status  On-going      PT LONG TERM GOAL #2   Title  Pt Lt knee extension to be 3 or less degrees to have a normal gait     Baseline  2/13: 2 deg    Time  4    Period  Weeks    Status  Achieved      PT LONG TERM GOAL #3   Title  PT Lt LE strength to be increased one grade to allow pt to go up and down 12 steps in a reciprocal manner without difficulty.     Baseline  2/13: see MMT, still having difficulty descending stairs    Time  4    Period  Weeks    Status  Partially Met      PT LONG TERM GOAL #4   Title  Pt will report being able to walk for 2 hours or > with 3/10 L knee pain or < in order to demo improved tolerance to WB and maximize his work duties.     Time  4    Period  Weeks    Status  New    Target Date  08/30/18      PT LONG TERM GOAL #5   Title  Pt will report being able to ascend and descend a full flight of stairs in reciprocal manner confidently with 3/10 L  knee pain or < to demo improved functional strength and maximize HH and community access.     Time  4    Period  Weeks    Status  New            Plan - 08/28/18 9211    Clinical Impression Statement  Pt with slightly increased pain this date but rates it to doing more walking/activity over the weekend as he did a lot of walking outside/on uneven terrain. Continued with established POC focusing on improving functional strength, balance, and overall stability. Added airplanes with GTB and star drill for hip stability and  strength. Continued with resisted walking at machine and resumed knee extension machine to gradually reintroduce him to machine strengthening. Ended with manual STM to reduce pain in calf and mediolateral HS. Pt reported feeling better at EOS. Pt due for reassessment next visit.     Rehab Potential  Good    PT Frequency  2x / week    PT Duration  4 weeks    PT Treatment/Interventions  ADLs/Self Care Home Management;Stair training;Gait training;Therapeutic activities;Passive range of motion;Manual techniques;Therapeutic exercise;Balance training;Patient/family education;Dry needling;Taping;Neuromuscular re-education;Cryotherapy;Electrical Stimulation;Ultrasound;Functional mobility training;Orthotic Fit/Training;Scar mobilization;Joint Manipulations    PT Next Visit Plan  reassessment; contiue to slowly perform machines; Continue machines for single leg strengthening. Continue STM to lateral HS and quadriceps for pain relief. Continue progress with ROM and strength as able.     PT Home Exercise Plan  supine hamstring stretch, single leg heelraise, sit to stand, towel extension; 1/23: standing hamstring stretch and TKE; 2/20: wall squats, hip abd BTB, sidestepping BTB, SLS vectors; 3/3: LAQ with weights; 3/10: prone TKE    Consulted and Agree with Plan of Care  Patient       Patient will benefit from skilled therapeutic intervention in order to improve the following deficits and  impairments:  Abnormal gait, Decreased activity tolerance, Decreased balance, Decreased range of motion, Decreased endurance, Decreased strength, Pain, Impaired perceived functional ability  Visit Diagnosis: Stiffness of left knee, not elsewhere classified  Muscle weakness (generalized)     Problem List Patient Active Problem List   Diagnosis Date Noted  . Suspected infection of left prosthetic knee joint ([place]) 07/02/2018  . Medication monitoring encounter 07/02/2018  . Failed partial knee, left ([place]) 05/15/2018  . Nocturnal leg cramps 09/26/2014  . Foot drop, right 09/24/2014  . 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 Upper airway cough syndrome 09/10/2014  . Aortic valve disease 11/19/2013  . Essential hypertension 11/19/2013  . Esophageal reflux 11/19/2013  . Gout 11/19/2013  . Aortic root enlargement ([place]) 11/19/2013       Geraldine Solar PT, DPT  [facility] [address] [place], [place], [address] Phone: [phone]   Fax:  [phone]  Name: Marvin Munoz MRN: [id number] Date of Birth: [birth year]

## 2018-08-30 ENCOUNTER — Other Ambulatory Visit: Payer: Self-pay

## 2018-08-30 ENCOUNTER — Ambulatory Visit (HOSPITAL_COMMUNITY): Payer: 59

## 2018-08-30 DIAGNOSIS — M25662 Stiffness of left knee, not elsewhere classified: Secondary | ICD-10-CM | POA: Diagnosis not present

## 2018-08-30 DIAGNOSIS — M6281 Muscle weakness (generalized): Secondary | ICD-10-CM

## 2018-08-30 NOTE — Therapy (Signed)
[place], [place], [address] Phone: [phone]   Fax:  [phone]   Progress Note Reporting Period 08/02/18 to 08/30/18  See note below for Objective Data and Assessment of Progress/Goals.   Physical Therapy Treatment  Patient Details  Name: Marvin Munoz MRN: [id number] Date of Birth: [birth year] Referring Provider (PT): Marchia Bond   Encounter Date: 08/30/2018  PT End of Session - 08/30/18 0901    Visit Number  17    Number of Visits  18    Date for PT Re-Evaluation  09/27/18    Authorization Type  [employer]    Authorization Time Period  1/20-->08/06/2018; 08/02/18 to 08/30/18; HEP POC 08/30/18 to 09/27/18    Authorization - Visit Number  79    Authorization - Number of Visits  60    PT Start Time  0816    PT Stop Time  0845    PT Time Calculation (min)  29 min    Activity Tolerance  Patient tolerated treatment well    Behavior During Therapy  [facility] for tasks assessed/performed       Past Medical History:  Diagnosis Date  . Allergic rhinitis   . Eczema   . Fever blister    occasional  . Foot drop, right 09/24/2014  . Gastroesophageal reflux   . Gout    05-09-2018 per pt last flare up, 2014  . Guillain Barr syndrome ([place]) 1970's   per pt received swine flu vaccination and regular flu vaccination at same time, was told one or the other may have caused guillain barre  . HTN (hypertension)   . Migraine headache   . Mild aortic valve stenosis cardiologist-  dr Daneen Schick   per last echo  02-01-2017   moderately thickended and calcificed leaflets with mild AS and mild AR,  valve area 2.43cm^2  . 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 Nocturia   . Nocturnal leg cramps   . OA (osteoarthritis)    back,  left knee  . PONV (postoperative nausea and vomiting)   . PVC's (premature ventricular  contractions)    hx of benign  . Restless legs syndrome (RLS)   . Thoracic ascending aortic aneurysm [facility])    followed by dr h. Tamala Julian--- per last CTA 02-21-2018 , 47m    Past Surgical History:  Procedure Laterality Date  . BREAST LUMPECTOMY Right 1990s   "benign"  . CARDIAC CATHETERIZATION  09/10/2015   dr hBonnita Nasutipreston  _0    normal coronaries and LVF,  false positive cardiolite  . COLONOSCOPY    . KNEE ARTHROSCOPY WITH MEDIAL MENISECTOMY Left 05/15/2018   Procedure: LEFT KNEE ARTHROSCOPY WITH MEDIAL MENISECTOMY;  Surgeon: LMarchia Bond MD;  Location: [facility];  Service: Orthopedics;  Laterality: Left;  . REPLACEMENT UNICONDYLAR JOINT KNEE Left 09/10/2015    dr lMardelle Matte _1   . TENDON RECONSTRUCTION Right 06/02/2016   Procedure: right elbow extensor tendon repair and debridement and repair lateral collateral ligament;  Surgeon: DNinetta Lights MD;  [facility]  Service: Orthopedics;  Laterality: Right;  . TONSILLECTOMY  child  . TOTAL KNEE ARTHROPLASTY WITH REVISION COMPONENTS Left 05/15/2018   Procedure: conversion to LEFT total KNEE ARTHROPLASTY with hardware removal;  Surgeon: LMarchia Bond MD;  Location: [facility];  Service: Orthopedics;  Laterality: Left;  with block  . TRANSTHORACIC ECHOCARDIOGRAM  02/01/2017   mild LVH,  ef 17-00%, Grade 2 diastolic dysfucntion/  moderately thickened moderately calcified AV leafleat with mild stenosis and moderate regurg. (valve area 2.43cm^2)/  mild to moderate calcified MV annulus with triv. regurg/ trivial TR/  m;ild RAE    There were no vitals filed for this visit.      [facility] PT Assessment - 08/30/18 0001      Assessment   Medical Diagnosis  Lt TKR    Referring Provider (PT)  Marchia Bond    Onset Date/Surgical Date  05/16/18    Hand Dominance  Right    Next MD Visit  09/01/18      AROM   Left Knee Extension  2    Left Knee Flexion  120      Strength   Left Knee Flexion  5/5           PT  Education - 08/30/18 0901    Education Details  reassessment findings, 4-week HEP POC    Person(s) Educated  Patient    Methods  Explanation;Handout    Comprehension  Verbalized understanding       PT Short Term Goals - 08/30/18 0907      PT SHORT TERM GOAL #1   Title  PT Lt knee flexion to be to 115 to allow pt to squat easily to pick items off of the floor.     Baseline  2/13: 117deg    Time  2    Period  Weeks    Status  Achieved      PT SHORT TERM GOAL #2   Title  Pt Lt knee extension to be no greater than five degrees to improve pt gait.     Baseline  2/13: 2deg    Time  2    Period  Weeks    Status  Achieved      PT SHORT TERM GOAL #3   Title  PT Lt LE strength to be increased by 1/2 grade to be able to come from sit to stand from a low lying sofa without difficulty     Baseline  3/12: see MMT    Time  2    Period  Weeks    Status  Achieved        PT Long Term Goals - 08/30/18 0908      PT LONG TERM GOAL #1   Title  PT Lt knee flexion to be to 120 to allow pt fo be able to stand from the ground to work in his yard.     Baseline  3/12: 120deg    Time  4    Period  Weeks    Status  Achieved      PT LONG TERM GOAL #2   Title  Pt Lt knee extension to be 3 or less degrees to have a normal gait     Baseline  3/12:: 2 deg    Time  4    Period  Weeks    Status  Achieved      PT LONG TERM GOAL #3   Title  PT Lt LE strength to be increased one grade to allow pt to go up and down 12 steps in a reciprocal manner without difficulty.     Baseline  3/12: 5/5 strength, mild difficulty with descending stairs    Time  4    Period  Weeks    Status  Achieved      PT LONG TERM  GOAL #4   Title  Pt will report being able to walk for 2 hours or > with 3/10 L knee pain or < in order to demo improved tolerance to WB and maximize his work duties.     Time  4    Period  Weeks    Status  On-going      PT LONG TERM GOAL #5   Title  Pt will report being able to ascend and  descend a full flight of stairs in reciprocal manner confidently with 3/10 L knee pain or < to demo improved functional strength and maximize HH and community access.     Baseline  3/12: no issues with ascending, still some unsteadiness with descending    Time  4    Period  Weeks    Status  Partially Met            Plan - 08/30/18 0856    Clinical Impression Statement  PT reassessed pt's goals and outcome measures this date. Pt has met all STG and 3/5 LTG, while partially meeting 1 LTG with his walking goal still on-going. Pt AROM 2-120deg today and his strength is 5/5 throughout all mm groups tested. Pt's main limiting factor at this time is his lateral joint pain, which he has been c/o for a while now, and his feelings of instability with descending stairs. Pt's job requires him to be up on his feet and walking a lot and he feels he is not quite ready to return to this yet due to his knee pain. PT feels that this joint pain is likely due to his infection and long course of antibiotics and educated pt that it will likely take more time to gradually improve. PT also educated that he does not need skilled intervention at this time due to his progress made and educated him that he would benefit from 4-week HEP POC for him to manage his symptoms and progression at home and he verbalized understanding. Pt will have 1 f/u appointment in 1 month and if he is able to maintain progress made then he will likely be d/c at that time.     Rehab Potential  Good    PT Frequency  Other (comment)   one f/u visit in 1 month   PT Duration  4 weeks    PT Treatment/Interventions  ADLs/Self Care Home Management;Stair training;Gait training;Therapeutic activities;Passive range of motion;Manual techniques;Therapeutic exercise;Balance training;Patient/family education;Dry needling;Taping;Neuromuscular re-education;Cryotherapy;Electrical Stimulation;Ultrasound;Functional mobility training;Orthotic Fit/Training;Scar  mobilization;Joint Manipulations    PT Next Visit Plan  reassess after 4-week HEP PO and likely d/c    PT Home Exercise Plan  supine hamstring stretch, single leg heelraise, sit to stand, towel extension; 1/23: standing hamstring stretch and TKE; 2/20: wall squats, hip abd BTB, sidestepping BTB, SLS vectors; 3/3: LAQ with weights; 3/10: prone TKE; 3/12: see below for extensive additions    Consulted and Agree with Plan of Care  Patient       Patient will benefit from skilled therapeutic intervention in order to improve the following deficits and impairments:  Abnormal gait, Decreased activity tolerance, Decreased balance, Decreased range of motion, Decreased endurance, Decreased strength, Pain, Impaired perceived functional ability  Visit Diagnosis: Stiffness of left knee, not elsewhere classified - Plan: PT plan of care cert/re-cert  Muscle weakness (generalized) - Plan: PT plan of care cert/re-cert     Problem List Patient Active Problem List   Diagnosis Date Noted  . Suspected infection of left prosthetic knee joint ([place])  07/02/2018  . Medication monitoring encounter 07/02/2018  . Failed partial knee, left ([place]) 05/15/2018  . Nocturnal leg cramps 09/26/2014  . Foot drop, right 09/24/2014  . 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 Upper airway cough syndrome 09/10/2014  . Aortic valve disease 11/19/2013  . Essential hypertension 11/19/2013  . Esophageal reflux 11/19/2013  . Gout 11/19/2013  . Aortic root enlargement ([place]) 11/19/2013      Geraldine Solar PT, DPT  [place] [address] [place], [place], [address] Phone: [phone]   Fax:  [phone]  Name: Marvin Munoz MRN: [id number] Date of Birth: 02-19-1961

## 2018-08-30 NOTE — Patient Instructions (Signed)
Access Code: [email or web address]  URL: [email or web address]  Date: 08/30/2018  Prepared by: Geraldine Solar   Exercises Knee Extension with Weight Machine - 10 reps - 3 sets - 1x daily - 7x weekly Single Leg Knee Extension with Weight Machine - 10 reps - 3 sets                            - 1x daily - 7x weekly Hamstring Curl with Weight Machine - 10 reps - 3 sets - 1x daily - 7x weekly Single Leg Hamstring Curl with Weight Machine - 10 reps - 3 sets                            - 1x daily - 7x weekly Full Leg Press - 10 reps - 3 sets - 1x daily - 7x weekly Supine Bridge - 10 reps - 3 sets - 1x daily - 7x weekly Single Leg Bridge - 10 reps - 3 sets - 1x daily - 7x weekly Standing Hip Abduction with Resistance at Ankles - 10 reps - 3 sets - 1x daily - 7x weekly Squat with Chair Touch - 10 reps - 3 sets - 1x daily - 7x weekly Trail Leg Lunge - 10 reps - 3 sets - 1x daily - 7x weekly Tandem Stance in Corner - 10 reps - 3 sets - 1x daily - 7x weekly Single Leg Stance - 10 reps - 3 sets - 1x daily - 7x weekly Standing 3-Way Kick - 10 reps - 3 sets - 1x daily - 7x weekly

## 2018-09-04 ENCOUNTER — Telehealth (HOSPITAL_COMMUNITY): Payer: Self-pay

## 2018-09-04 NOTE — Telephone Encounter (Signed)
Pt called to make this apptment 09/27/2018 with Jerene Pitch for 1 month f/u. NF 09/04/2018

## 2018-09-19 ENCOUNTER — Telehealth (HOSPITAL_COMMUNITY): Payer: Self-pay

## 2018-09-19 ENCOUNTER — Encounter (HOSPITAL_COMMUNITY): Payer: Self-pay

## 2018-09-19 NOTE — Telephone Encounter (Signed)
Left voicemail informing pt that our clinic is cancelling all appointments until further notice due to COVID-19 in order to ensure his safety and our staff's during this time. Pt was scheduled for a 1 month f/u appointment so PT asked that pt call back to let therapist know if he wanted to get that rescheduled once our clinic reopens or if he felt ready for d/c. Left front office number for him to contact therapist at.   Geraldine Solar PT, DPT

## 2018-09-19 NOTE — Therapy (Signed)
[place], [place], [address] Phone: [phone]   Fax:  [phone]  Patient Details  Name: Marvin Munoz MRN: [id number] Date of Birth: 29-Jun-1960 Referring Provider:  No ref. provider found  Encounter Date: 09/19/2018   PT cancelled 09/27/18 f/u appointment with pt due to clinic being closed until at least 10/08/18 deu to COVID-19. Pt stated he was comfortable with being d/c at this time.   PHYSICAL THERAPY DISCHARGE SUMMARY  Visits from Start of Care: 17  Current functional level related to goals / functional outcomes: See last note   Remaining deficits: See last note   Education / Equipment: n/a  Plan: Patient agrees to discharge.  Patient goals were partially met. Patient is being discharged due to being pleased with the current functional level.  ?????     Geraldine Solar PT, [place] [address] [place], [place], [address] Phone: [phone]   Fax:  [phone]

## 2018-09-19 NOTE — Telephone Encounter (Signed)
Pt returned phone call and stated that he was ready for d/c due to his progress made and current functional status. No questions regarding HEP, etc.   Geraldine Solar PT, DPT

## 2018-09-25 ENCOUNTER — Encounter: Payer: Self-pay | Admitting: Infectious Diseases

## 2018-09-25 ENCOUNTER — Other Ambulatory Visit: Payer: Self-pay

## 2018-09-25 ENCOUNTER — Ambulatory Visit: Payer: 59 | Admitting: Infectious Diseases

## 2018-09-25 VITALS — BP 122/77 | HR 69 | Temp 98.0°F | Wt 216.0 lb

## 2018-09-25 DIAGNOSIS — T8459XD Infection and inflammatory reaction due to other internal joint prosthesis, subsequent encounter: Secondary | ICD-10-CM

## 2018-09-25 DIAGNOSIS — Z96659 Presence of unspecified artificial knee joint: Secondary | ICD-10-CM | POA: Diagnosis not present

## 2018-09-25 DIAGNOSIS — Z5181 Encounter for therapeutic drug level monitoring: Secondary | ICD-10-CM

## 2018-09-25 NOTE — Assessment & Plan Note (Signed)
Will repeat inflammatory markers today off antibiotic therapy.

## 2018-09-25 NOTE — Patient Instructions (Signed)
Nice to see that you continue to do well.  We will repeat some blood work today and I will release your results on my chart once I have them.  Based on your exam and what you have told me about your knee since stopping her antibiotics I feel very confident that we make the right decisions in regards to your treatment.    Hopeful that when you see Dr. Mardelle Matte he will agree if he has any concerns feel free to have him call me or you can call and let me know his findings.  If everything looks okay we can see you back as needed for now.

## 2018-09-25 NOTE — Assessment & Plan Note (Signed)
Albeiro seems to be doing very well 1 month after stopping antibiotic therapy.  He has had no negative symptoms returned since his treatment is stopped.  I told him today that we will continue to monitor how he is doing off antibiotics but it seems that we have achieved a good outcome of cure for him.  I asked him to please call me if Dr. Luanna Cole team has any concern over residual infection.  He can return as needed for now unless his lab work indicates an alternative need.

## 2018-09-25 NOTE — Progress Notes (Signed)
Patient: Marvin Munoz  DOB: Nov 06, 1960 MRN: [id number] PCP: Josetta Huddle, MD  Referring Provider: Mardelle Matte    Patient Active Problem List   Diagnosis Date Noted  . 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 Medication monitoring encounter 07/02/2018  . Failed partial knee, left ([place]) 05/15/2018  . Nocturnal leg cramps 09/26/2014  . Foot drop, right 09/24/2014  . 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 Upper airway cough syndrome 09/10/2014  . Aortic valve disease 11/19/2013  . Essential hypertension 11/19/2013  . Esophageal reflux 11/19/2013  . Gout 11/19/2013  . Aortic root enlargement ([place]) 11/19/2013     Subjective:  CC:  Hospital follow up after possible prosthetic left knee infection. Feeling well and no complaints today. Possible exposure to the flu through his wife (who is on Tamiflu).   Brief ID Hx:  Marvin Munoz is a 58 y.o. male.   Marvin Munoz underwent a partial left knee replacement about 2 years ago. He presented outpatient to Dr. Mardelle Matte with about 1 year of recurrent effusions, difficulty walking and persistent intermittent pain that was described to be moderate to severe. Preoperative work up demonstrated synovitis on bone scan with negative gout or infectious work up with aspirations of knee. On 05-15-18 he underwent surgical revision and conversion to total knee replacement with all new hardware. Operative note reviewed - substantial hypertrophic scar with very thickened tissues; brownish appearance to the synovium (?pigmented villondular synvitis); The undersurface of the patella and lateral compartment had a strange blackish appearance to the cartilage that was suspicious for chronic/indolent infection. One intraoperative specimen had GNR on stain but no growth. He was treated empirically with vancomycin/ceftriaxone x 6w and transitioned to ciprofloxacin x 3w considering warmth/stiffness.   HPI: Marvin Munoz returns after having  completed Cipro ofloxacin for 1 month after his 6 weeks of IV treatment.  He has been off of his medication for a month now and has had no negative changes to report.  He is wearing a knee brace on the left knee for support which has helped him immensely.  He sees Dr. Mardelle Matte next week to discuss return to work precautions.  His only complaint or concern today is that he is still having some fatigue and needs to take rest periods doing activities he was once able to do easily.  He does have a mild cough but he is taking allergy medications and Flonase for nasal congestion.  No fevers no chills.  Is pretty typical for this time year.  Review of Systems  Constitutional: Negative for chills, fever and malaise/fatigue.  Respiratory: Negative for cough and sputum production.   Cardiovascular: Negative for chest pain and leg swelling.  Gastrointestinal: Negative for constipation and diarrhea.  Genitourinary: Negative for dysuria.  Musculoskeletal: Negative for joint pain.  Skin: Negative for rash.    Past Medical History:  Diagnosis Date  . Allergic rhinitis   . Eczema   . Fever blister    occasional  . Foot drop, right 09/24/2014  . Gastroesophageal reflux   . Gout    05-09-2018 per pt last flare up, 2014  . Guillain Barr syndrome ([place]) 1970's   per pt received swine flu vaccination and regular flu vaccination at same time, was told one or the other may have caused guillain barre  . HTN (hypertension)   . Migraine headache   . Mild aortic valve stenosis cardiologist-  dr Daneen Schick   per last echo  02-01-2017   moderately thickended and calcificed leaflets with mild AS and mild AR,  valve area 2.43cm^2  . 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 Nocturia   . Nocturnal leg cramps   . OA (osteoarthritis)    back,  left knee  . PONV (postoperative nausea and vomiting)   . PVC's  (premature ventricular contractions)    hx of benign  . Restless legs syndrome (RLS)   . Thoracic ascending aortic aneurysm [facility])    followed by dr h. Tamala Julian--- per last CTA 02-21-2018 , 75mm    Outpatient Medications Prior to Visit  Medication Sig Dispense Refill  . allopurinol (ZYLOPRIM) 300 MG tablet Take 150 mg by mouth every evening.     Marland Kitchen aspirin EC 325 MG tablet Take 1 tablet (325 mg total) by mouth 2 (two) times daily. 60 tablet 0  . baclofen (LIORESAL) 10 MG tablet Take 1 tablet (10 mg total) by mouth 3 (three) times daily. As needed for muscle spasm 50 tablet 0  . Calcium Carb-Cholecalciferol (CALCIUM + D3 PO) Take 1 tablet by mouth daily.     . cetirizine (ZYRTEC) 10 MG tablet Take 10 mg by mouth every morning.     . diltiazem (CARDIZEM CD) 240 MG 24 hr capsule Take 240 mg by mouth every evening.     Marland Kitchen FIBER PO Take 1 capsule by mouth daily.    . Multiple Vitamin (MULTIVITAMIN) tablet Take 1 tablet by mouth daily.    Marland Kitchen omeprazole (PRILOSEC) 40 MG capsule Take 40 mg by mouth every morning.     . ondansetron (ZOFRAN) 4 MG tablet Take 1 tablet (4 mg total) by mouth every 8 (eight) hours as needed for nausea or vomiting. 10 tablet 0  . oxyCODONE (ROXICODONE) 5 MG immediate release tablet Take 1 tablet (5 mg total) by mouth every 4 (four) hours as needed for severe pain. 30 tablet 0  . pramipexole (MIRAPEX) 0.5 MG tablet Take 0.5 mg by mouth at bedtime.     . Psyllium (METAMUCIL FIBER) 51.7 % PACK Take by mouth.    . rizatriptan (MAXALT-MLT) 10 MG disintegrating tablet Take 10 mg by mouth as needed for migraine. May repeat in 2 hours if needed    . sennosides-docusate sodium (SENOKOT-S) 8.6-50 MG tablet Take 2 tablets by mouth daily. 30 tablet 1  . traMADol (ULTRAM) 50 MG tablet Take 50 mg by mouth every 12 (twelve) hours as needed.    . valACYclovir (VALTREX) 1000 MG tablet Take 1,000 mg by mouth daily as needed (for cold sores).      No facility-administered medications prior to  visit.      Allergies  Allergen Reactions  . Colchicine Diarrhea  . Influenza Vaccines Other (See Comments)    Guillain-barre syndrome  . Adhesive [Tape] Rash    Social History   Tobacco Use  . Smoking status: Never Smoker  . Smokeless tobacco: Never Used  Substance Use Topics  . Alcohol use: Not Currently    Alcohol/week: 0.0 standard drinks  . Drug use: Never    Family History  Problem Relation Age of Onset  . Heart attack Father   . Prostate cancer Father   . COPD Mother        smoked  . Asthma Mother   . Arthritis/Rheumatoid Mother   . Leukemia Maternal Grandfather   . Prostate cancer Maternal Grandfather   .  Prostate cancer Maternal Uncle     Objective:   Vitals:   09/25/18 1424  BP: 122/77  Pulse: 69  Temp: 98 F (36.7 C)  Weight: 216 lb (98 kg)   Body mass index is 27 kg/m.  Physical Exam Constitutional:      Appearance: Normal appearance. He is not ill-appearing or toxic-appearing.  Cardiovascular:     Rate and Rhythm: Normal rate and regular rhythm.     Heart sounds: No murmur.  Pulmonary:     Effort: Pulmonary effort is normal.     Breath sounds: Normal breath sounds. No wheezing or rales.  Musculoskeletal:     Comments: L TKR incision well healed. No swelling or tenderness on exam today. 90 deg flexion and full extension.  No longer needs the cane to walk.  He is wearing a knee brace today.  Lymphadenopathy:     Cervical: No cervical adenopathy.  Skin:    General: Skin is warm and dry.  Neurological:     Mental Status: He is alert.     Lab Results: Lab Results  Component Value Date   WBC 4.8 07/26/2018   HGB 13.1 (L) 07/26/2018   HCT 38.5 07/26/2018   MCV 92.3 07/26/2018   PLT 248 07/26/2018    Lab Results  Component Value Date   CREATININE 1.11 07/26/2018   BUN 16 07/26/2018   NA 140 07/26/2018   K 4.7 07/26/2018   CL 105 07/26/2018   CO2 28 07/26/2018   No results found for: ALT, AST, GGT, ALKPHOS, BILITOT    Assessment & Plan:   Problem List Items Addressed This Visit      Unprioritized   Suspected infection of left prosthetic knee joint ([place]) - Primary    Marvin Munoz seems to be doing very well 1 month after stopping antibiotic therapy.  He has had no negative symptoms returned since his treatment is stopped.  I told him today that we will continue to monitor how he is doing off antibiotics but it seems that we have achieved a good outcome of cure for him.  I asked him to please call me if Dr. Luanna Cole team has any concern over residual infection.  He can return as needed for now unless his lab work indicates an alternative need.      Medication monitoring encounter    Will repeat inflammatory markers today off antibiotic therapy.      Relevant Orders   C-reactive protein   Sedimentation rate     Janene Madeira, MSN, NP-C [facility] for [facility] [place] Pager: [phone] Office: [phone]  09/25/18  2:43 PM

## 2018-09-26 LAB — SEDIMENTATION RATE: Sed Rate: 2 mm/h (ref 0–20)

## 2018-09-26 LAB — C-REACTIVE PROTEIN: CRP: 0.5 mg/L (ref <8.0)

## 2018-09-26 NOTE — Progress Notes (Signed)
Patient's inflammatory markers continue to be in normal range.  He has been off antibiotics nearly 4 weeks.  He can follow-up the office as needed and orthopedic team as directed by their office.

## 2018-09-27 ENCOUNTER — Ambulatory Visit (HOSPITAL_COMMUNITY): Payer: 59

## 2019-04-15 ENCOUNTER — Ambulatory Visit (HOSPITAL_COMMUNITY): Payer: 59 | Attending: Cardiology

## 2019-04-15 ENCOUNTER — Other Ambulatory Visit: Payer: Self-pay

## 2019-04-15 DIAGNOSIS — I712 Thoracic aortic aneurysm, without rupture, unspecified: Secondary | ICD-10-CM

## 2019-04-15 DIAGNOSIS — I359 Nonrheumatic aortic valve disorder, unspecified: Secondary | ICD-10-CM | POA: Insufficient documentation

## 2019-04-20 NOTE — Progress Notes (Signed)
Cardiology Office Note:    Date:  04/22/2019   ID:  Marvin Munoz, Marvin Munoz May 07, 1961, MRN [id number]  PCP:  Josetta Huddle, MD  Cardiologist:  Sinclair Grooms, MD   Referring MD: Josetta Huddle, MD   Chief Complaint  Patient presents with  . Cardiac Valve Problem    Bicuspid aortic valve    History of Present Illness:    Marvin Munoz is a 58 y.o. male with a hx of bicuspid aortic valve, aortic regurgitation, and hyperlipidemia and hypertension.Marland Kitchen  He is asymptomatic.  He has not had orthopnea, PND, palpitations, syncope, or other complaints.  Past Medical History:  Diagnosis Date  . Allergic rhinitis   . Eczema   . Fever blister    occasional  . Foot drop, right 09/24/2014  . Gastroesophageal reflux   . Gout    05-09-2018 per pt last flare up, 2014  . Guillain Barr syndrome ([place]) 1970's   per pt received swine flu vaccination and regular flu vaccination at same time, was told one or the other may have caused guillain barre  . HTN (hypertension)   . Migraine headache   . Mild aortic valve stenosis cardiologist-  dr Daneen Schick   per last echo  02-01-2017   moderately thickended and calcificed leaflets with mild AS and mild AR,  valve area 2.43cm^2  . 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 Nocturia   . Nocturnal leg cramps   . OA (osteoarthritis)    back,  left knee  . PONV (postoperative nausea and vomiting)   . PVC's (premature ventricular contractions)    hx of benign  . Restless legs syndrome (RLS)   . Thoracic ascending aortic aneurysm [facility])    followed by dr h. Tamala Julian--- per last CTA 02-21-2018 , 69mm    Past Surgical History:  Procedure Laterality Date  . BREAST LUMPECTOMY Right 1990s   "benign"  . CARDIAC CATHETERIZATION  09/10/2015   dr Bonnita Nasuti pre[place]  @[facility]    normal coronaries and LVF,  false positive cardiolite  . COLONOSCOPY    . KNEE  ARTHROSCOPY WITH MEDIAL MENISECTOMY Left 05/15/2018   Procedure: LEFT KNEE ARTHROSCOPY WITH MEDIAL MENISECTOMY;  Surgeon: Marchia Bond, MD;  Location: [facility];  Service: Orthopedics;  Laterality: Left;  . REPLACEMENT UNICONDYLAR JOINT KNEE Left 09/10/2015    dr Mardelle Matte  @[facility]   . TENDON RECONSTRUCTION Right 06/02/2016   Procedure: right elbow extensor tendon repair and debridement and repair lateral collateral ligament;  Surgeon: Ninetta Lights, MD;  Location: [place];  Service: Orthopedics;  Laterality: Right;  . TONSILLECTOMY  child  . TOTAL KNEE ARTHROPLASTY WITH REVISION COMPONENTS Left 05/15/2018   Procedure: conversion to LEFT total KNEE ARTHROPLASTY with hardware removal;  Surgeon: Marchia Bond, MD;  Location: [facility];  Service: Orthopedics;  Laterality: Left;  with block  . TRANSTHORACIC ECHOCARDIOGRAM  02/01/2017   mild LVH,  ef [id number], Grade 2 diastolic dysfucntion/  moderately thickened moderately calcified AV leafleat with mild stenosis and moderate regurg. (valve area 2.43cm^2)/  mild to moderate calcified MV annulus with triv. regurg/ trivial TR/  m;ild RAE    Current Medications: Current Meds  Medication Sig  . allopurinol (ZYLOPRIM) 300 MG tablet Take 150 mg by mouth every evening.   Marland Kitchen aspirin EC 81 MG tablet Take 81 mg by mouth daily.  . baclofen (LIORESAL) 10 MG tablet  Take 1 tablet (10 mg total) by mouth 3 (three) times daily. As needed for muscle spasm  . Calcium Carb-Cholecalciferol (CALCIUM + D3 PO) Take 1 tablet by mouth daily.   . cetirizine (ZYRTEC) 10 MG tablet Take 10 mg by mouth every morning.   . diltiazem (CARDIZEM CD) 240 MG 24 hr capsule Take 240 mg by mouth every evening.   Marland Kitchen FIBER PO Take 1 capsule by mouth daily.  . Multiple Vitamin (MULTIVITAMIN) tablet Take 1 tablet by mouth daily.  Marland Kitchen omeprazole (PRILOSEC) 40 MG capsule Take 40 mg by mouth every morning.   . pramipexole (MIRAPEX) 0.5 MG tablet Take 0.5 mg by mouth at bedtime.   .  rizatriptan (MAXALT-MLT) 10 MG disintegrating tablet Take 10 mg by mouth as needed for migraine. May repeat in 2 hours if needed  . rOPINIRole (REQUIP) 1 MG tablet Take 2 mg by mouth daily.  . valACYclovir (VALTREX) 1000 MG tablet Take 1,000 mg by mouth daily as needed (for cold sores).   . zolpidem (AMBIEN) 10 MG tablet Take 10 mg by mouth as needed.     Allergies:   Colchicine, Influenza vaccines, and Adhesive [tape]   Social History   Socioeconomic History  . Marital status: Married    Spouse name: Not on file  . Number of children: 2  . Years of education: college  . Highest education level: Not on file  Occupational History  . Occupation: Librarian, academic, Control and instrumentation engineer  Social Needs  . Financial resource strain: Not on file  . Food insecurity    Worry: Not on file    Inability: Not on file  . Transportation needs    Medical: Not on file    Non-medical: Not on file  Tobacco Use  . Smoking status: Never Smoker  . Smokeless tobacco: Never Used  Substance and Sexual Activity  . Alcohol use: Not Currently    Alcohol/week: 0.0 standard drinks  . Drug use: Never  . Sexual activity: Yes  Lifestyle  . Physical activity    Days per week: Not on file    Minutes per session: Not on file  . Stress: Not on file  Relationships  . Social Herbalist on phone: Not on file    Gets together: Not on file    Attends religious service: Not on file    Active member of club or organization: Not on file    Attends meetings of clubs or organizations: Not on file    Relationship status: Not on file  Other Topics Concern  . Not on file  Social History Narrative   Patient is right handed.   Patient does not drink caffeine.     Family History: The patient's family history includes Arthritis/Rheumatoid in his mother; Asthma in his mother; COPD in his mother; Heart attack in his father; Leukemia in his maternal grandfather; Prostate cancer in his father, maternal grandfather, and  maternal uncle.  ROS:   Please see the history of present illness.    No complaints.  All other systems reviewed and are negative.  EKGs/Labs/Other Studies Reviewed:    The following studies were reviewed today:  2D Doppler echocardiogram October 2020: IMPRESSIONS  1. Left ventricular ejection fraction, by visual estimation, is 60 to 65%. The left ventricle has normal function. Normal left ventricular size. There is mildly increased left ventricular hypertrophy.  2. Global right ventricle has normal systolic function.The right ventricular size is normal.  3. Left atrial size was normal.  4. Right atrial size was normal.  5. The mitral valve is normal in structure. Trace mitral valve regurgitation. No evidence of mitral stenosis.  6. The tricuspid valve is normal in structure. Tricuspid valve regurgitation is trivial.  7. The aortic valve has an indeterminant number of cusps Aortic valve regurgitation is mild to moderate by color flow Doppler.  8. The pulmonic valve was normal in structure. Pulmonic valve regurgitation is trivial by color flow Doppler.  9. Aortic dilatation noted. 10. There is mild dilatation of the ascending aorta measuring 40 mm. 11. The inferior vena cava is normal in size with greater than 50% respiratory variability, suggesting right atrial pressure of 3 mmHg. 12. Normal LV systolic function; mild LVH; mildly dilated ascending aorta; mild to moderate AI.  2018 aortic diameter was 41 mm.  Aortic diameter 40 mm on 2020 study.  CT angiogram of the chest and aorta: September 2019 IMPRESSION: 1. Stable uncomplicated mild fusiform ectasia of the ascending thoracic aorta measuring 39 mm in diameter, unchanged compared to the 08/2014 examination. 2. Grossly unchanged borderline cardiomegaly with apparent calcifications within the aortic valve leaflets as be seen in setting of aortic stenosis - further evaluation cardiac echo could be performed as clinically indicated.  3. Aortic Atherosclerosis (ICD10-I70.0).  EKG:  EKG normal sinus rhythm with normal EKG appearance  Recent Labs: 07/26/2018: BUN 16; Creat 1.11; Hemoglobin 13.1; Platelets 248; Potassium 4.7; Sodium 140  Recent Lipid Panel No results found for: CHOL, TRIG, HDL, CHOLHDL, VLDL, LDLCALC, LDLDIRECT  Physical Exam:    VS:  BP 112/76   Pulse 65   Ht 6\' 3"  (1.905 m)   Wt 197 lb 12.8 oz (89.7 kg)   SpO2 96%   BMI 24.72 kg/m     Wt Readings from Last 3 Encounters:  04/22/19 197 lb 12.8 oz (89.7 kg)  09/25/18 216 lb (98 kg)  07/26/18 215 lb (97.5 kg)     GEN: Slender. No acute distress HEENT: Normal NECK: No JVD. LYMPHATICS: No lymphadenopathy CARDIAC: There is a soft systolic but no significant diastolic no mitral regurgitation murmur.  RRR without  gallop, or edema. VASCULAR:  Normal Pulses. No bruits. RESPIRATORY:  Clear to auscultation without rales, wheezing or rhonchi  ABDOMEN: Soft, non-tender, non-distended, No pulsatile mass, MUSCULOSKELETAL: No deformity  SKIN: Warm and dry NEUROLOGIC:  Alert and oriented x 3 PSYCHIATRIC:  Normal affect   ASSESSMENT:    1. Aortic valve disease   2. Essential hypertension   3. Thoracic aortic aneurysm without rupture ([place])   4. Hyperlipidemia, unspecified hyperlipidemia type   5. Aortic atherosclerosis ([place])   6. Educated about COVID-19 virus infection    PLAN:    In order of problems listed above:  1. I have personally reviewed the echo report.  The aortic root size, LV cavity size, and quantitation of regurgitation is unchanged compared to 2018. 2. Blood pressure less than 140/80 mmHg will be required. 3. Based on echo the aortic diameter has not changed significantly.  Last year was 39 mm by CT this year 4.0 cm by echo compare with 4.1 cm by echo 2 years ago. 4. LDL is greater than 120.  It was nearly 130 a year ago.  Given aortic atherosclerosis, family history of CAD, lipids are too high and with continued burden of exposure  will increase his risk of having disease.  Start rosuvastatin 5 mg/day.  Target LDL less than or equal to 70.  We may need to consider reinstituting aspirin 81  mg/day. 5. Atherosclerosis is identified.  Preventive therapy as discussed. 6. 3W's is discussed and is endorsed by the patient.  Overall education and awareness concerning secondary risk prevention was discussed in detail: LDL less than 70, hemoglobin A1c less than 7, blood pressure target less than 130/80 mmHg, >150 minutes of moderate aerobic activity per week, avoidance of smoking, weight control (via diet and exercise), and continued surveillance/management of/for obstructive sleep apnea.    Medication Adjustments/Labs and Tests Ordered: Current medicines are reviewed at length with the patient today.  Concerns regarding medicines are outlined above.  Orders Placed This Encounter  Procedures  . Hepatic function panel  . Lipid panel  . EKG 12-Lead   Meds ordered this encounter  Medications  . rosuvastatin (CRESTOR) 5 MG tablet    Sig: Take 1 tablet (5 mg total) by mouth daily.    Dispense:  90 tablet    Refill:  3    Patient Instructions  Medication Instructions:  1) START Rosuvastatin 5mg  once daily  *If you need a refill on your cardiac medications before your next appointment, please call your pharmacy*  Lab Work: Your physician recommends that you return for lab work in: 2 months.  You will need to be fasting for these labs (nothing to eat or drink after midnight except water and black coffee).  If you have labs (blood work) drawn today and your tests are completely normal, you will receive your results only by: Marland Kitchen MyChart Message (if you have MyChart) OR . A paper copy in the mail If you have any lab test that is abnormal or we need to change your treatment, we will call you to review the results.  Testing/Procedures: None  Follow-Up: At [facility], you and your health needs are our priority.  As part of  our continuing mission to provide you with exceptional heart care, we have created designated Provider Care Teams.  These Care Teams include your primary Cardiologist (physician) and Advanced Practice Providers (APPs -  Physician Assistants and Nurse Practitioners) who all work together to provide you with the care you need, when you need it.  Your next appointment:   12 months  The format for your next appointment:   In Person  Provider:   You may see Sinclair Grooms, MD or one of the following Advanced Practice Providers on your designated Care Team:    Truitt Merle, NP  Cecilie Kicks, NP  Kathyrn Drown, NP   Other Instructions      Signed, Sinclair Grooms, MD  04/22/2019 2:39 PM    [place]

## 2019-04-22 ENCOUNTER — Other Ambulatory Visit: Payer: Self-pay

## 2019-04-22 ENCOUNTER — Ambulatory Visit: Payer: 59 | Admitting: Interventional Cardiology

## 2019-04-22 ENCOUNTER — Encounter: Payer: Self-pay | Admitting: Interventional Cardiology

## 2019-04-22 VITALS — BP 112/76 | HR 65 | Ht 75.0 in | Wt 197.8 lb

## 2019-04-22 DIAGNOSIS — I712 Thoracic aortic aneurysm, without rupture, unspecified: Secondary | ICD-10-CM

## 2019-04-22 DIAGNOSIS — E785 Hyperlipidemia, unspecified: Secondary | ICD-10-CM

## 2019-04-22 DIAGNOSIS — Z7189 Other specified counseling: Secondary | ICD-10-CM

## 2019-04-22 DIAGNOSIS — I359 Nonrheumatic aortic valve disorder, unspecified: Secondary | ICD-10-CM | POA: Diagnosis not present

## 2019-04-22 DIAGNOSIS — I700 Atherosclerosis of aorta: Secondary | ICD-10-CM

## 2019-04-22 DIAGNOSIS — I10 Essential (primary) hypertension: Secondary | ICD-10-CM | POA: Diagnosis not present

## 2019-04-22 MED ORDER — ROSUVASTATIN CALCIUM 5 MG PO TABS: 5.0000 mg | ORAL_TABLET | Freq: Every day | ORAL | 3 refills | Status: DC

## 2019-04-22 NOTE — Patient Instructions (Signed)
Medication Instructions:  1) START Rosuvastatin 5mg  once daily  *If you need a refill on your cardiac medications before your next appointment, please call your pharmacy*  Lab Work: Your physician recommends that you return for lab work in: 2 months.  You will need to be fasting for these labs (nothing to eat or drink after midnight except water and black coffee).  If you have labs (blood work) drawn today and your tests are completely normal, you will receive your results only by: Marland Kitchen MyChart Message (if you have MyChart) OR . A paper copy in the mail If you have any lab test that is abnormal or we need to change your treatment, we will call you to review the results.  Testing/Procedures: None  Follow-Up: At [facility], you and your health needs are our priority.  As part of our continuing mission to provide you with exceptional heart care, we have created designated Provider Care Teams.  These Care Teams include your primary Cardiologist (physician) and Advanced Practice Providers (APPs -  Physician Assistants and Nurse Practitioners) who all work together to provide you with the care you need, when you need it.  Your next appointment:   12 months  The format for your next appointment:   In Person  Provider:   You may see Sinclair Grooms, MD or one of the following Advanced Practice Providers on your designated Care Team:    Truitt Merle, NP  Cecilie Kicks, NP  Kathyrn Drown, NP   Other Instructions

## 2019-06-18 ENCOUNTER — Other Ambulatory Visit: Payer: Self-pay | Admitting: Interventional Cardiology

## 2019-06-19 LAB — LIPID PANEL
Chol/HDL Ratio: 3.2 ratio (ref 0.0–5.0)
Cholesterol, Total: 153 mg/dL (ref 100–199)
HDL: 48 mg/dL (ref >39)
LDL Chol Calc (NIH): 85 mg/dL (ref 0–99)
Triglycerides: 108 mg/dL (ref 0–149)
VLDL Cholesterol Cal: 20 mg/dL (ref 5–40)

## 2019-06-19 LAB — HEPATIC FUNCTION PANEL
ALT: 18 IU/L (ref 0–44)
AST: 18 IU/L (ref 0–40)
Albumin: 4.4 g/dL (ref 3.8–4.9)
Alkaline Phosphatase: 92 IU/L (ref 39–117)
Bilirubin Total: 0.4 mg/dL (ref 0.0–1.2)
Bilirubin, Direct: 0.1 mg/dL (ref 0.00–0.40)
Total Protein: 6.5 g/dL (ref 6.0–8.5)

## 2019-10-28 ENCOUNTER — Ambulatory Visit: Payer: 59 | Attending: Internal Medicine

## 2019-10-28 DIAGNOSIS — Z20822 Contact with and (suspected) exposure to covid-19: Secondary | ICD-10-CM

## 2019-10-29 ENCOUNTER — Other Ambulatory Visit: Payer: 59

## 2019-10-29 LAB — SARS-COV-2, NAA 2 DAY TAT

## 2019-10-29 LAB — NOVEL CORONAVIRUS, NAA: SARS-CoV-2, NAA: NOT DETECTED

## 2020-03-03 ENCOUNTER — Other Ambulatory Visit: Payer: Self-pay | Admitting: Interventional Cardiology

## 2020-05-01 ENCOUNTER — Ambulatory Visit: Payer: 59 | Admitting: Cardiology

## 2020-05-28 ENCOUNTER — Other Ambulatory Visit: Payer: Self-pay | Admitting: Interventional Cardiology

## 2020-07-20 NOTE — Progress Notes (Signed)
Cardiology Office Note:    Date:  07/21/2020   ID:  Marvin Munoz [birth year], MRN [id number]  PCP:  Marvin Huddle, MD  Cardiologist:  Marvin Grooms, MD   Referring MD: Marvin Huddle, MD   Chief Complaint  Patient presents with  . Thoracic Aortic Aneurysm  . Cardiac Valve Problem    History of Present Illness:    Marvin Munoz is a 60 y.o. male with a hx of bicuspid aortic valve, ascending aortic aneurysm, aortic regurgitation, hyperlipidemia and primary hypertension.  Asymptomatic relative to any cardiac complaints.  Has not had chest pain, orthopnea, PND, lower extremity swelling, or other complaints.  No limitations.  Past Medical History:  Diagnosis Date  . Allergic rhinitis   . Eczema   . Fever blister    occasional  . Foot drop, right 09/24/2014  . Gastroesophageal reflux   . Gout    05-09-2018 per pt last flare up, 2014  . Guillain Barr syndrome ([place]) 1970's   per pt received swine flu vaccination and regular flu vaccination at same time, was told one or the other may have caused guillain barre  . HTN (hypertension)   . Migraine headache   . Mild aortic valve stenosis cardiologist-  dr Daneen Schick   per last echo  02-01-2017   moderately thickended and calcificed leaflets with mild AS and mild AR,  valve area 2.43cm^2  . 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 Nocturia   . Nocturnal leg cramps   . OA (osteoarthritis)    back,  left knee  . PONV (postoperative nausea and vomiting)   . PVC's (premature ventricular contractions)    hx of benign  . Restless legs syndrome (RLS)   . Thoracic ascending aortic aneurysm [facility]    followed by dr h. Tamala Julian--- per last CTA 02-21-2018 , 45mm    Past Surgical History:  Procedure Laterality Date  . BREAST LUMPECTOMY Right 1990s   "benign"  . CARDIAC CATHETERIZATION  09/10/2015   dr Bonnita Nasuti [place]  @[facility]     normal coronaries and LVF,  false positive cardiolite  . COLONOSCOPY    . KNEE ARTHROSCOPY WITH MEDIAL MENISECTOMY Left 05/15/2018   Procedure: LEFT KNEE ARTHROSCOPY WITH MEDIAL MENISECTOMY;  Surgeon: Marchia Bond, MD;  Location: [facility];  Service: Orthopedics;  Laterality: Left;  . REPLACEMENT UNICONDYLAR JOINT KNEE Left 09/10/2015    dr Mardelle Matte  @[facility]   . TENDON RECONSTRUCTION Right 06/02/2016   Procedure: right elbow extensor tendon repair and debridement and repair lateral collateral ligament;  Surgeon: Ninetta Lights, MD;  Location: [place];  Service: Orthopedics;  Laterality: Right;  . TONSILLECTOMY  child  . TOTAL KNEE ARTHROPLASTY WITH REVISION COMPONENTS Left 05/15/2018   Procedure: conversion to LEFT total KNEE ARTHROPLASTY with hardware removal;  Surgeon: Marchia Bond, MD;  Location: [facility];  Service: Orthopedics;  Laterality: Left;  with block  . TRANSTHORACIC ECHOCARDIOGRAM  02/01/2017   mild LVH,  ef [id number], Grade 2 diastolic dysfucntion/  moderately thickened moderately calcified AV leafleat with mild stenosis and moderate regurg. (valve area 2.43cm^2)/  mild to moderate calcified MV annulus with triv. regurg/ trivial TR/  m;ild RAE    Current Medications: Current Meds  Medication Sig  . allopurinol (ZYLOPRIM) 300 MG tablet Take 150 mg by mouth every evening.   Marland Kitchen aspirin EC 81 MG tablet Take 81 mg  by mouth daily.  . baclofen (LIORESAL) 10 MG tablet Take 1 tablet (10 mg total) by mouth 3 (three) times daily. As needed for muscle spasm  . Calcium Carb-Cholecalciferol (CALCIUM + D3 PO) Take 1 tablet by mouth daily.   . cetirizine (ZYRTEC) 10 MG tablet Take 10 mg by mouth every morning.   . diltiazem (CARDIZEM CD) 240 MG 24 hr capsule Take 240 mg by mouth every evening.   Marland Kitchen FIBER PO Take 1 capsule by mouth daily.  . Multiple Vitamin (MULTIVITAMIN) tablet Take 1 tablet by mouth daily.  Marland Kitchen omeprazole (PRILOSEC) 40 MG capsule Take 40 mg by mouth every morning.    . pramipexole (MIRAPEX) 0.5 MG tablet Take 0.5 mg by mouth at bedtime.   . rizatriptan (MAXALT-MLT) 10 MG disintegrating tablet Take 10 mg by mouth as needed for migraine. May repeat in 2 hours if needed  . rOPINIRole (REQUIP) 1 MG tablet Take 2 mg by mouth daily.  . rosuvastatin (CRESTOR) 5 MG tablet TAKE (1) TABLET BY MOUTH ONCE DAILY.  . valACYclovir (VALTREX) 1000 MG tablet Take 1,000 mg by mouth daily as needed (for cold sores).   . zolpidem (AMBIEN) 10 MG tablet Take 10 mg by mouth as needed.     Allergies:   Colchicine, Influenza vaccines, and Adhesive [tape]   Social History   Socioeconomic History  . Marital status: Married    Spouse name: Not on file  . Number of children: 2  . Years of education: college  . Highest education level: Not on file  Occupational History  . Occupation: Librarian, academic, Control and instrumentation engineer  Tobacco Use  . Smoking status: Never Smoker  . Smokeless tobacco: Never Used  Vaping Use  . Vaping Use: Never used  Substance and Sexual Activity  . Alcohol use: Not Currently    Alcohol/week: 0.0 standard drinks  . Drug use: Never  . Sexual activity: Yes  Other Topics Concern  . Not on file  Social History Narrative   Patient is right handed.   Patient does not drink caffeine.   Social Determinants of Health   Financial Resource Strain: Not on file  Food Insecurity: Not on file  Transportation Needs: Not on file  Physical Activity: Not on file  Stress: Not on file  Social Connections: Not on file     Family History: The patient's family history includes Arthritis/Rheumatoid in his mother; Asthma in his mother; COPD in his mother; Heart attack in his father; Leukemia in his maternal grandfather; Prostate cancer in his father, maternal grandfather, and maternal uncle.  ROS:   Please see the history of present illness.    Restless leg syndrome this is a major concern and complaint.  All other systems reviewed and are negative.  EKGs/Labs/Other  Studies Reviewed:    The following studies were reviewed today:  ECHOCARDIOGRAM 2020: IMPRESSIONS  1. Left ventricular ejection fraction, by visual estimation, is 60 to  65%. The left ventricle has normal function. Normal left ventricular size.  There is mildly increased left ventricular hypertrophy.  2. Global right ventricle has normal systolic function.The right  ventricular size is normal.  3. Left atrial size was normal.  4. Right atrial size was normal.  5. The mitral valve is normal in structure. Trace mitral valve  regurgitation. No evidence of mitral stenosis.  6. The tricuspid valve is normal in structure. Tricuspid valve  regurgitation is trivial.  7. The aortic valve has an indeterminant number of cusps Aortic valve  regurgitation is  mild to moderate by color flow Doppler.  8. The pulmonic valve was normal in structure. Pulmonic valve  regurgitation is trivial by color flow Doppler.  9. Aortic dilatation noted.  10. There is mild dilatation of the ascending aorta measuring 40 mm.  11. The inferior vena cava is normal in size with greater than 50%  respiratory variability, suggesting right atrial pressure of 3 mmHg.  12. Normal LV systolic function; mild LVH; mildly dilated ascending aorta;  mild to moderate AI.   EKG:  EKG normal sinus rhythm, normal appearance.  Poor R wave progression.  No change when compared to November 2020.  Recent Labs: No results found for requested labs within last 8760 hours.  Recent Lipid Panel    Component Value Date/Time   CHOL 153 06/18/2019 0914   TRIG 108 06/18/2019 0914   HDL 48 06/18/2019 0914   CHOLHDL 3.2 06/18/2019 0914   LDLCALC 85 06/18/2019 0914    Physical Exam:    VS:  BP 128/72   Pulse 72   Ht 6\' 3"  (1.905 m)   Wt 201 lb (91.2 kg)   SpO2 97%   BMI 25.12 kg/m     Wt Readings from Last 3 Encounters:  07/21/20 201 lb (91.2 kg)  04/22/19 197 lb 12.8 oz (89.7 kg)  09/25/18 216 lb (98 kg)     GEN:  Slender. No acute distress HEENT: Normal NECK: No JVD. LYMPHATICS: No lymphadenopathy CARDIAC: No murmur. RRR no gallop, or edema. VASCULAR:  Normal Pulses. No bruits. RESPIRATORY:  Clear to auscultation without rales, wheezing or rhonchi  ABDOMEN: Soft, non-tender, non-distended, No pulsatile mass, MUSCULOSKELETAL: No deformity  SKIN: Warm and dry NEUROLOGIC:  Alert and oriented x 3 PSYCHIATRIC:  Normal affect   ASSESSMENT:    1. Aortic valve disease   2. Thoracic aortic aneurysm without rupture ([place])   3. Essential hypertension   4. Hyperlipidemia, unspecified hyperlipidemia type   5. Aortic atherosclerosis ([place])   6. Educated about COVID-19 virus infection    PLAN:    In order of problems listed above:  1. Aortic CTA to follow bicuspid AoV associated aortopathy.. We will not do an echocardiogram this year as his exam is clinically silent. 2. Aortic CTA 3. Excellent blood pressure control on current therapy.  Continue diltiazem 240 mg/day. 4. Continue low intensity rosuvastatin 5 mg/day. 5. Continue statin therapy 6. Clinical follow-up in 1 year.  Continue risk mitigation.     Medication Adjustments/Labs and Tests Ordered: Current medicines are reviewed at length with the patient today.  Concerns regarding medicines are outlined above.  Orders Placed This Encounter  Procedures  . CT ANGIO CHEST AORTA W/CM & OR WO/CM  . Basic metabolic panel   No orders of the defined types were placed in this encounter.   Patient Instructions  Medication Instructions:  Your physician recommends that you continue on your current medications as directed. Please refer to the Current Medication list given to you today.  *If you need a refill on your cardiac medications before your next appointment, please call your pharmacy*   Lab Work: BMET today  If you have labs (blood work) drawn today and your tests are completely normal, you will receive your results only by: Marland Kitchen MyChart  Message (if you have MyChart) OR . A paper copy in the mail If you have any lab test that is abnormal or we need to change your treatment, we will call you to review the results.   Testing/Procedures: Your  physician recommends that you have a CT of the Aorta performed.   Follow-Up: At [facility], you and your health needs are our priority.  As part of our continuing mission to provide you with exceptional heart care, we have created designated Provider Care Teams.  These Care Teams include your primary Cardiologist (physician) and Advanced Practice Providers (APPs -  Physician Assistants and Nurse Practitioners) who all work together to provide you with the care you need, when you need it.  We recommend signing up for the patient portal called "MyChart".  Sign up information is provided on this After Visit Summary.  MyChart is used to connect with patients for Virtual Visits (Telemedicine).  Patients are able to view lab/test results, encounter notes, upcoming appointments, etc.  Non-urgent messages can be sent to your provider as well.   To learn more about what you can do with MyChart, go to [email or web address].    Your next appointment:   1 year(s)  The format for your next appointment:   In Person  Provider:   You may see Marvin Grooms, MD or one of the following Advanced Practice Providers on your designated Care Team:    Kathyrn Drown, NP    Other Instructions      Signed, Marvin Grooms, MD  07/21/2020 12:05 PM    [place]

## 2020-07-21 ENCOUNTER — Encounter: Payer: Self-pay | Admitting: Interventional Cardiology

## 2020-07-21 ENCOUNTER — Ambulatory Visit: Payer: 59 | Admitting: Interventional Cardiology

## 2020-07-21 ENCOUNTER — Other Ambulatory Visit: Payer: Self-pay

## 2020-07-21 VITALS — BP 128/72 | HR 72 | Ht 75.0 in | Wt 201.0 lb

## 2020-07-21 DIAGNOSIS — I700 Atherosclerosis of aorta: Secondary | ICD-10-CM

## 2020-07-21 DIAGNOSIS — I359 Nonrheumatic aortic valve disorder, unspecified: Secondary | ICD-10-CM

## 2020-07-21 DIAGNOSIS — I10 Essential (primary) hypertension: Secondary | ICD-10-CM

## 2020-07-21 DIAGNOSIS — I712 Thoracic aortic aneurysm, without rupture, unspecified: Secondary | ICD-10-CM

## 2020-07-21 DIAGNOSIS — E785 Hyperlipidemia, unspecified: Secondary | ICD-10-CM

## 2020-07-21 DIAGNOSIS — Z7189 Other specified counseling: Secondary | ICD-10-CM

## 2020-07-21 LAB — BASIC METABOLIC PANEL
BUN/Creatinine Ratio: 13 (ref 10–24)
BUN: 15 mg/dL (ref 8–27)
CO2: 25 mmol/L (ref 20–29)
Calcium: 9.9 mg/dL (ref 8.6–10.2)
Chloride: 104 mmol/L (ref 96–106)
Creatinine, Ser: 1.12 mg/dL (ref 0.76–1.27)
GFR calc Af Amer: 82 mL/min/{1.73_m2} (ref >59)
GFR calc non Af Amer: 71 mL/min/{1.73_m2} (ref >59)
Glucose: 101 mg/dL — ABNORMAL HIGH (ref 65–99)
Potassium: 4.5 mmol/L (ref 3.5–5.2)
Sodium: 142 mmol/L (ref 134–144)

## 2020-07-21 NOTE — Patient Instructions (Signed)
Medication Instructions:  Your physician recommends that you continue on your current medications as directed. Please refer to the Current Medication list given to you today.  *If you need a refill on your cardiac medications before your next appointment, please call your pharmacy*   Lab Work: BMET today  If you have labs (blood work) drawn today and your tests are completely normal, you will receive your results only by: Marland Kitchen MyChart Message (if you have MyChart) OR . A paper copy in the mail If you have any lab test that is abnormal or we need to change your treatment, we will call you to review the results.   Testing/Procedures: Your physician recommends that you have a CT of the Aorta performed.   Follow-Up: At [facility], you and your health needs are our priority.  As part of our continuing mission to provide you with exceptional heart care, we have created designated Provider Care Teams.  These Care Teams include your primary Cardiologist (physician) and Advanced Practice Providers (APPs -  Physician Assistants and Nurse Practitioners) who all work together to provide you with the care you need, when you need it.  We recommend signing up for the patient portal called "MyChart".  Sign up information is provided on this After Visit Summary.  MyChart is used to connect with patients for Virtual Visits (Telemedicine).  Patients are able to view lab/test results, encounter notes, upcoming appointments, etc.  Non-urgent messages can be sent to your provider as well.   To learn more about what you can do with MyChart, go to [email or web address].    Your next appointment:   1 year(s)  The format for your next appointment:   In Person  Provider:   You may see Sinclair Grooms, MD or one of the following Advanced Practice Providers on your designated Care Team:    Kathyrn Drown, NP    Other Instructions

## 2020-07-22 NOTE — Addendum Note (Signed)
Addended by: Carylon Perches on: 07/22/2020 05:22 PM   Modules accepted: Orders

## 2020-07-23 ENCOUNTER — Other Ambulatory Visit: Payer: 59

## 2020-07-27 ENCOUNTER — Inpatient Hospital Stay: Admission: RE | Admit: 2020-07-27 | Payer: 59 | Source: Ambulatory Visit

## 2020-08-05 ENCOUNTER — Inpatient Hospital Stay: Admission: RE | Admit: 2020-08-05 | Payer: 59 | Source: Ambulatory Visit

## 2020-08-05 ENCOUNTER — Other Ambulatory Visit: Payer: Self-pay | Admitting: Family

## 2020-08-05 ENCOUNTER — Telehealth: Payer: Self-pay

## 2020-08-05 ENCOUNTER — Ambulatory Visit (HOSPITAL_COMMUNITY)
Admission: RE | Admit: 2020-08-05 | Discharge: 2020-08-05 | Disposition: A | Discharge status: Home / Self Care | Payer: 59 | Source: Ambulatory Visit | Attending: Pulmonary Disease | Admitting: Pulmonary Disease

## 2020-08-05 DIAGNOSIS — U071 COVID-19: Secondary | ICD-10-CM | POA: Insufficient documentation

## 2020-08-05 MED ORDER — FAMOTIDINE IN NACL 20-0.9 MG/50ML-% IV SOLN: 20.0000 mg | Freq: Once | INTRAVENOUS | Status: DC | PRN

## 2020-08-05 MED ORDER — SODIUM CHLORIDE 0.9 % IV SOLN: INTRAVENOUS | Status: DC | PRN

## 2020-08-05 MED ORDER — EPINEPHRINE 0.3 MG/0.3ML IJ SOAJ: 0.3000 mg | Freq: Once | INTRAMUSCULAR | Status: DC | PRN

## 2020-08-05 MED ORDER — SOTROVIMAB 500 MG/8ML IV SOLN
500.0000 mg | Freq: Once | INTRAVENOUS | Status: AC
  Administered 2020-08-05: 500 mg via INTRAVENOUS

## 2020-08-05 MED ORDER — ALBUTEROL SULFATE HFA 108 (90 BASE) MCG/ACT IN AERS: 2.0000 | INHALATION_SPRAY | Freq: Once | RESPIRATORY_TRACT | Status: DC | PRN

## 2020-08-05 MED ORDER — SODIUM CHLORIDE 0.9 % IV BOLUS
1000.0000 mL | Freq: Once | INTRAVENOUS | Status: AC
  Administered 2020-08-05: 1000 mL via INTRAVENOUS

## 2020-08-05 MED ORDER — ONDANSETRON HCL 4 MG/2ML IJ SOLN
4.0000 mg | Freq: Once | INTRAMUSCULAR | Status: AC
  Administered 2020-08-05: 4 mg via INTRAVENOUS
  Filled 2020-08-05: qty 2

## 2020-08-05 MED ORDER — DIPHENHYDRAMINE HCL 50 MG/ML IJ SOLN: 50.0000 mg | Freq: Once | INTRAMUSCULAR | Status: DC | PRN

## 2020-08-05 MED ORDER — METHYLPREDNISOLONE SODIUM SUCC 125 MG IJ SOLR: 125.0000 mg | Freq: Once | INTRAMUSCULAR | Status: DC | PRN

## 2020-08-05 NOTE — Telephone Encounter (Signed)
I connected by phone with Marvin Munoz on 08/05/2020 at 10:49 AM to discuss the potential use of a new treatment for mild to moderate COVID-19 viral infection in non-hospitalized patients.  This patient is a 60 y.o. male that meets the FDA criteria for Emergency Use Authorization of COVID monoclonal antibody sotrovimab.  Has a (+) direct SARS-CoV-2 viral test result  Has mild or moderate COVID-19   Is NOT hospitalized due to COVID-19  Is within 10 days of symptom onset  Has at least one of the high risk factor(s) for progression to severe COVID-19 and/or hospitalization as defined in EUA.  Specific high risk criteria : Cardiovascular disease or hypertension and Other high risk medical condition per CDC:  unvaccinated  I have spoken and communicated the following to the patient or parent/caregiver regarding COVID monoclonal antibody treatment:  1. FDA has authorized the emergency use for the treatment of mild to moderate COVID-19 in adults and pediatric patients with positive results of direct SARS-CoV-2 viral testing who are 20 years of age and older weighing at least 40 kg, and who are at high risk for progressing to severe COVID-19 and/or hospitalization.  2. The significant known and potential risks and benefits of COVID monoclonal antibody, and the extent to which such potential risks and benefits are unknown.  3. Information on available alternative treatments and the risks and benefits of those alternatives, including clinical trials.  4. Patients treated with COVID monoclonal antibody should continue to self-isolate and use infection control measures (e.g., wear mask, isolate, social distance, avoid sharing personal items, clean and disinfect "high touch" surfaces, and frequent handwashing) according to CDC guidelines.   5. The patient or parent/caregiver has the option to accept or refuse COVID monoclonal antibody treatment.  After reviewing this information with the patient, the  patient has agreed to receive one of the available covid 19 monoclonal antibodies and will be provided an appropriate fact sheet prior to infusion. Loel Dubonnet, NP 08/05/2020 10:49 AM

## 2020-08-05 NOTE — Progress Notes (Deleted)
Patient reviewed Fact Sheet for Patients, Parents, and Caregivers for Emergency Use Authorization (EUA) of sotrovimab for the Treatment of Coronavirus. Patient also reviewed and is agreeable to the estimated cost of treatment. Patient is agreeable to proceed.   

## 2020-08-05 NOTE — Telephone Encounter (Signed)
Called to discuss with patient about COVID-19 symptoms and the use of one of the available treatments for those with mild to moderate Covid symptoms and at a high risk of hospitalization.  Pt appears to qualify for outpatient treatment due to co-morbid conditions and/or a member of an at-risk group in accordance with the FDA Emergency Use Authorization.    Symptom onset: 07/31/20 Cough,headache, fever,nausea Vaccinated: No Booster? No Immunocompromised?  Qualifiers: HTN, Guillian-Barre in his teens  Would like to speak with APP.  Marvin Munoz

## 2020-08-05 NOTE — Discharge Instructions (Signed)

## 2020-08-05 NOTE — Discharge Summary (Signed)
Diagnosis: COVID-19  Physician: Dr. Asencion Noble  Procedure: [facility]fusion [facility] Med: Sotrovimab infusion - Provided patient with sotrovimab fact sheet for patients, parents, and caregivers prior to infusion.   Patient given Zofran IV and 1LNS for c/o N/V.   Complications: No immediate complications noted  Discharge: Discharged home

## 2020-08-05 NOTE — Progress Notes (Signed)
Patient reviewed Fact Sheet for Patients, Parents, and Caregivers for Emergency Use Authorization (EUA) of sotrovimab for the Treatment of Coronavirus. Patient also reviewed and is agreeable to the estimated cost of treatment. Patient is agreeable to proceed.   

## 2020-08-08 ENCOUNTER — Other Ambulatory Visit (HOSPITAL_COMMUNITY): Payer: Self-pay

## 2020-08-12 ENCOUNTER — Inpatient Hospital Stay: Admission: RE | Admit: 2020-08-12 | Payer: 59 | Source: Ambulatory Visit

## 2020-08-17 ENCOUNTER — Other Ambulatory Visit: Payer: Self-pay

## 2020-08-17 ENCOUNTER — Ambulatory Visit (INDEPENDENT_AMBULATORY_CARE_PROVIDER_SITE_OTHER)
Admission: RE | Admit: 2020-08-17 | Discharge: 2020-08-17 | Disposition: A | Discharge status: Home / Self Care | Payer: 59 | Source: Ambulatory Visit | Attending: Interventional Cardiology | Admitting: Interventional Cardiology

## 2020-08-17 DIAGNOSIS — I712 Thoracic aortic aneurysm, without rupture, unspecified: Secondary | ICD-10-CM

## 2020-08-17 DIAGNOSIS — I359 Nonrheumatic aortic valve disorder, unspecified: Secondary | ICD-10-CM

## 2020-08-17 MED ORDER — IOHEXOL 350 MG/ML SOLN
100.0000 mL | Freq: Once | INTRAVENOUS | Status: AC | PRN
  Administered 2020-08-17: 100 mL via INTRAVENOUS

## 2020-08-28 ENCOUNTER — Other Ambulatory Visit: Payer: Self-pay | Admitting: Interventional Cardiology

## 2020-09-02 ENCOUNTER — Telehealth: Payer: Self-pay | Admitting: Internal Medicine

## 2020-09-04 NOTE — Telephone Encounter (Signed)
error 

## 2020-09-07 ENCOUNTER — Other Ambulatory Visit: Payer: Self-pay | Admitting: Physician Assistant

## 2020-09-07 ENCOUNTER — Ambulatory Visit
Admission: RE | Admit: 2020-09-07 | Discharge: 2020-09-07 | Disposition: A | Discharge status: Home / Self Care | Payer: 59 | Source: Ambulatory Visit | Attending: Physician Assistant | Admitting: Physician Assistant

## 2020-09-07 DIAGNOSIS — R0600 Dyspnea, unspecified: Secondary | ICD-10-CM

## 2020-09-07 DIAGNOSIS — R0609 Other forms of dyspnea: Secondary | ICD-10-CM

## 2020-09-14 ENCOUNTER — Other Ambulatory Visit: Payer: Self-pay | Admitting: Internal Medicine

## 2020-09-14 ENCOUNTER — Ambulatory Visit
Admission: RE | Admit: 2020-09-14 | Discharge: 2020-09-14 | Disposition: A | Discharge status: Home / Self Care | Payer: 59 | Source: Ambulatory Visit | Attending: Internal Medicine | Admitting: Internal Medicine

## 2020-09-14 DIAGNOSIS — U099 Post covid-19 condition, unspecified: Secondary | ICD-10-CM

## 2020-09-22 ENCOUNTER — Other Ambulatory Visit: Payer: Self-pay

## 2020-09-22 ENCOUNTER — Ambulatory Visit: Payer: 59 | Admitting: Pulmonary Disease

## 2020-09-22 ENCOUNTER — Encounter: Payer: Self-pay | Admitting: Pulmonary Disease

## 2020-09-22 VITALS — BP 140/80 | HR 89 | Temp 98.0°F | Ht 75.0 in | Wt 202.0 lb

## 2020-09-22 DIAGNOSIS — U099 Post covid-19 condition, unspecified: Secondary | ICD-10-CM | POA: Diagnosis not present

## 2020-09-22 DIAGNOSIS — R0600 Dyspnea, unspecified: Secondary | ICD-10-CM

## 2020-09-22 DIAGNOSIS — R0609 Other forms of dyspnea: Secondary | ICD-10-CM

## 2020-09-22 LAB — CBC WITH DIFFERENTIAL/PLATELET
Basophils Absolute: 0 10*3/uL (ref 0.0–0.1)
Basophils Relative: 1.2 % (ref 0.0–3.0)
Eosinophils Absolute: 0.1 10*3/uL (ref 0.0–0.7)
Eosinophils Relative: 1.8 % (ref 0.0–5.0)
HCT: 38.6 % — ABNORMAL LOW (ref 39.0–52.0)
Hemoglobin: 13 g/dL (ref 13.0–17.0)
Lymphocytes Relative: 34.6 % (ref 12.0–46.0)
Lymphs Abs: 1.4 10*3/uL (ref 0.7–4.0)
MCHC: 33.6 g/dL (ref 30.0–36.0)
MCV: 93.4 fl (ref 78.0–100.0)
Monocytes Absolute: 0.4 10*3/uL (ref 0.1–1.0)
Monocytes Relative: 9.7 % (ref 3.0–12.0)
Neutro Abs: 2.1 10*3/uL (ref 1.4–7.7)
Neutrophils Relative %: 52.7 % (ref 43.0–77.0)
Platelets: 219 10*3/uL (ref 150.0–400.0)
RBC: 4.14 Mil/uL — ABNORMAL LOW (ref 4.22–5.81)
RDW: 13.9 % (ref 11.5–15.5)
WBC: 4 10*3/uL (ref 4.0–10.5)

## 2020-09-22 MED ORDER — PREDNISONE 10 MG PO TABS: 40.0000 mg | ORAL_TABLET | Freq: Every day | ORAL | 2 refills | Status: DC

## 2020-09-22 MED ORDER — SULFAMETHOXAZOLE-TRIMETHOPRIM 800-160 MG PO TABS: 1.0000 | ORAL_TABLET | ORAL | 2 refills | Status: DC

## 2020-09-22 NOTE — Patient Instructions (Addendum)
We will check labs today including CBC differential, IgE, N-terminal proBNP for dyspnea, D-dimer Schedule high-resolution CT, pulmonary function test Referral to pulmonary rehab at [facility] prednisone 40 mg a day and Bactrim double strength 3 times per week We will start an inhaler called Breo  Follow-up in 1 to 2 months after PFTs.

## 2020-09-22 NOTE — Progress Notes (Signed)
Marvin Munoz    [id number]    01-23-1961  Primary Care Physician:Gates, Herbie [place], MD  Referring Physician: Josetta Huddle, MD [address] [place],  [place] [address]  Chief complaint: Consult for post COVID-46  HPI: 60 year old with mild asthma not on inhalers, thoracic aneurysm, aortic regurgitation Developed COVID-19 in February 2022.  He received monoclonal antibody therapy on 2/16.  Did not require hospitalization CTA done by cardiologist as routine for thoracic aneurysm showed bilateral infiltrates consistent with COVID-19.  Follow-up imaging shows persistent pulmonary infiltrates on chest x-ray  He has persistent dyspnea on exertion, dry cough since then.  Treated with 2 rounds of prednisone for 7 days each and 2 rounds of antibiotics, given albuterol rescue inhaler without change.  Denies any fevers, chills, wheeze Currently on short-term disability from his job  Pets: No pets Occupation: Designer, industrial/product for [employer] Exposures: No mold, hot tub, Jacuzzi, no feather pillows or comforter Smoking history: Never smoker Travel history: No significant travel history Relevant family history: Mom had COPD.  She was a smoker.   Outpatient Encounter Medications as of 09/22/2020  Medication Sig  . allopurinol (ZYLOPRIM) 300 MG tablet Take 150 mg by mouth every evening.   Marland Kitchen aspirin EC 81 MG tablet Take 81 mg by mouth daily.  . baclofen (LIORESAL) 10 MG tablet Take 1 tablet (10 mg total) by mouth 3 (three) times daily. As needed for muscle spasm  . Calcium Carb-Cholecalciferol (CALCIUM + D3 PO) Take 1 tablet by mouth daily.   . cetirizine (ZYRTEC) 10 MG tablet Take 10 mg by mouth every morning.   . diltiazem (CARDIZEM CD) 240 MG 24 hr capsule Take 240 mg by mouth every evening.   Marland Kitchen FIBER PO Take 1 capsule by mouth daily.  . Multiple Vitamin (MULTIVITAMIN) tablet Take 1 tablet by mouth daily.  Marland Kitchen omeprazole (PRILOSEC) 40 MG capsule Take 40 mg by  mouth every morning.   . pramipexole (MIRAPEX) 0.5 MG tablet Take 0.5 mg by mouth at bedtime.   . rizatriptan (MAXALT-MLT) 10 MG disintegrating tablet Take 10 mg by mouth as needed for migraine. May repeat in 2 hours if needed  . rOPINIRole (REQUIP) 1 MG tablet Take 2 mg by mouth daily.  . rosuvastatin (CRESTOR) 5 MG tablet TAKE (1) TABLET BY MOUTH ONCE DAILY.  . valACYclovir (VALTREX) 1000 MG tablet Take 1,000 mg by mouth daily as needed (for cold sores).   . zolpidem (AMBIEN) 10 MG tablet Take 10 mg by mouth as needed.   No facility-administered encounter medications on file as of 09/22/2020.    Allergies as of 09/22/2020 - Review Complete 09/22/2020  Allergen Reaction Noted  . Colchicine Diarrhea 11/18/2013  . Influenza vaccines Other (See Comments) 12/10/2013  . Adhesive [tape] Rash 06/02/2016    Past Medical History:  Diagnosis Date  . Allergic rhinitis   . Eczema   . Fever blister    occasional  . Foot drop, right 09/24/2014  . Gastroesophageal reflux   . Gout    05-09-2018 per pt last flare up, 2014  . Guillain Barr syndrome ([place]) 1970's   per pt received swine flu vaccination and regular flu vaccination at same time, was told one or the other may have caused guillain barre  . HTN (hypertension)   . Migraine headache   . Mild aortic valve stenosis cardiologist-  dr Daneen Schick   per last echo  02-01-2017   moderately thickended  and calcificed leaflets with mild AS and mild AR,  valve area 2.43cm^2  . 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 Nocturia   . Nocturnal leg cramps   . OA (osteoarthritis)    back,  left knee  . PONV (postoperative nausea and vomiting)   . PVC's (premature ventricular contractions)    hx of benign  . Restless legs syndrome (RLS)   . Thoracic ascending aortic aneurysm [facility])    followed by dr h. Tamala Julian--- per last CTA 02-21-2018 ,  63mm    Past Surgical History:  Procedure Laterality Date  . BREAST LUMPECTOMY Right 1990s   "benign"  . CARDIAC CATHETERIZATION  09/10/2015   dr Bonnita Nasuti [place]  @[facility]    normal coronaries and LVF,  false positive cardiolite  . COLONOSCOPY    . KNEE ARTHROSCOPY WITH MEDIAL MENISECTOMY Left 05/15/2018   Procedure: LEFT KNEE ARTHROSCOPY WITH MEDIAL MENISECTOMY;  Surgeon: Marchia Bond, MD;  Location: [facility];  Service: Orthopedics;  Laterality: Left;  . REPLACEMENT UNICONDYLAR JOINT KNEE Left 09/10/2015    dr Mardelle Matte  @[facility]   . TENDON RECONSTRUCTION Right 06/02/2016   Procedure: right elbow extensor tendon repair and debridement and repair lateral collateral ligament;  Surgeon: Ninetta Lights, MD;  Location: [place];  Service: Orthopedics;  Laterality: Right;  . TONSILLECTOMY  child  . TOTAL KNEE ARTHROPLASTY WITH REVISION COMPONENTS Left 05/15/2018   Procedure: conversion to LEFT total KNEE ARTHROPLASTY with hardware removal;  Surgeon: Marchia Bond, MD;  Location: [facility];  Service: Orthopedics;  Laterality: Left;  with block  . TRANSTHORACIC ECHOCARDIOGRAM  02/01/2017   mild LVH,  ef 83-15%, Grade 2 diastolic dysfucntion/  moderately thickened moderately calcified AV leafleat with mild stenosis and moderate regurg. (valve area 2.43cm^2)/  mild to moderate calcified MV annulus with triv. regurg/ trivial TR/  m;ild RAE    Family History  Problem Relation Age of Onset  . Heart attack Father   . Prostate cancer Father   . COPD Mother        smoked  . Asthma Mother   . Arthritis/Rheumatoid Mother   . Leukemia Maternal Grandfather   . Prostate cancer Maternal Grandfather   . Prostate cancer Maternal Uncle     Social History   Socioeconomic History  . Marital status: Married    Spouse name: Not on file  . Number of children: 2  . Years of education: college  . Highest education level: Not on file  Occupational History  . Occupation: Librarian, academic, Control and instrumentation engineer   Tobacco Use  . Smoking status: Never Smoker  . Smokeless tobacco: Never Used  Vaping Use  . Vaping Use: Never used  Substance and Sexual Activity  . Alcohol use: Not Currently    Alcohol/week: 0.0 standard drinks  . Drug use: Never  . Sexual activity: Yes  Other Topics Concern  . Not on file  Social History Narrative   Patient is right handed.   Patient does not drink caffeine.   Social Determinants of Health   Financial Resource Strain: Not on file  Food Insecurity: Not on file  Transportation Needs: Not on file  Physical Activity: Not on file  Stress: Not on file  Social Connections: Not on file  Intimate Partner Violence: Not on file    Review of systems: Review of Systems  Constitutional: Negative for fever and chills.  HENT: Negative.  Eyes: Negative for blurred vision.  Respiratory: as per HPI  Cardiovascular: Negative for chest pain and palpitations.  Gastrointestinal: Negative for vomiting, diarrhea, blood per rectum. Genitourinary: Negative for dysuria, urgency, frequency and hematuria.  Musculoskeletal: Negative for myalgias, back pain and joint pain.  Skin: Negative for itching and rash.  Neurological: Negative for dizziness, tremors, focal weakness, seizures and loss of consciousness.  Endo/Heme/Allergies: Negative for environmental allergies.  Psychiatric/Behavioral: Negative for depression, suicidal ideas and hallucinations.  All other systems reviewed and are negative.  Physical Exam: Blood pressure 140/80, pulse 89, temperature 98 F (36.7 C), temperature source Temporal, height 6\' 3"  (1.905 m), weight 202 lb (91.6 kg), SpO2 99 %. Gen:      No acute distress HEENT:  EOMI, sclera anicteric Neck:     No masses; no thyromegaly Lungs:    Clear to auscultation bilaterally; normal respiratory effort CV:         Regular rate and rhythm; no murmurs Abd:      + bowel sounds; soft, non-tender; no palpable masses, no distension Ext:    No edema; adequate  peripheral perfusion Skin:      Warm and dry; no rash Neuro: alert and oriented x 3 Psych: normal mood and affect  Data Reviewed: Imaging: CTA 08/17/2020-calcification of the aortic valve, multifocal peripheral groundglass opacities bilaterally. Chest x-ray 09/07/2020-persistent bilateral infiltrates Chest x-ray 09/14/2020-slight improvement in lung infiltrate  I have reviewed the images personally.  PFTs:  Labs:  Cardiac: Echocardiogram 04/15/2019-LVEF 60 to 65%, mild LVH normal RV size and function, mild to moderate aortic regurgitation  Assessment:  Post COVID-19 Has persistent symptoms and infiltrates on chest x-ray.  Last x-ray done on 3/28 Suspect he has post Covid pneumonitis I have reviewed his CTA from February which does not show significant clot though this was not a PE study.   Symptoms appear to be out of proportion to the extent of pulmonary infiltrates Check baseline labs including CBC differential, IgE, D-dimer and proBNP We will start him on prednisone 40 mg a day and Bactrim as he will require a prolonged taper Schedule a high-resolution CT, pulmonary function test Start breo  Referral to pulmonary rehab in [place]  Plan/Recommendations: CBC, IgE, BNP, D-dimer High-res CT, PFTs Prednisone 40 mg a day, Bactrim prophylaxis Pulmonary rehab Arneta Cliche MD [facility] 09/22/2020, 9:32 AM  CC: Josetta Huddle, MD

## 2020-09-23 ENCOUNTER — Telehealth: Payer: Self-pay | Admitting: Pulmonary Disease

## 2020-09-23 DIAGNOSIS — R0600 Dyspnea, unspecified: Secondary | ICD-10-CM

## 2020-09-23 DIAGNOSIS — U099 Post covid-19 condition, unspecified: Secondary | ICD-10-CM

## 2020-09-23 DIAGNOSIS — R7989 Other specified abnormal findings of blood chemistry: Secondary | ICD-10-CM

## 2020-09-23 DIAGNOSIS — R0609 Other forms of dyspnea: Secondary | ICD-10-CM

## 2020-09-23 LAB — D-DIMER, QUANTITATIVE: D-Dimer, Quant: 1.65 mcg/mL FEU — ABNORMAL HIGH (ref <0.50)

## 2020-09-23 LAB — PRO B NATRIURETIC PEPTIDE: NT-Pro BNP: 101 pg/mL (ref 0–210)

## 2020-09-23 LAB — IGE: IgE (Immunoglobulin E), Serum: 7 kU/L (ref <=114)

## 2020-09-23 MED ORDER — BREO ELLIPTA 200-25 MCG/INH IN AEPB: 1.0000 | INHALATION_SPRAY | Freq: Every day | RESPIRATORY_TRACT | 6 refills | Status: DC

## 2020-09-23 NOTE — Telephone Encounter (Signed)
Nurse is waiting for pt to call back about needing CT angio.  I called [facility] and he can go to one of their locations in the morning.  I won't know which one until I call them in the morning.  Please let pt know when he calls in that I will call him in the morning to let him know where to go.

## 2020-09-23 NOTE — Telephone Encounter (Signed)
Will close this message as there is another message that already has information in it for the pt and he has been called and LM to call back.

## 2020-09-23 NOTE — Telephone Encounter (Signed)
Breo 200 ordered and sent to patient's pharmacy, CTA ordered as well.  ATC patient x1.  Requested that he return the call to the office.  Will await his return call.

## 2020-09-23 NOTE — Telephone Encounter (Signed)
Start Breo 200   Please let him know that his labs from yesterday shows an elevation in D-dimer which may indicate clots Cancel HRCT and order CTA to rule out PE to be done

## 2020-09-23 NOTE — Addendum Note (Signed)
Addended by: Vanessa Barbara on: 09/23/2020 03:24 PM   Modules accepted: Orders

## 2020-09-23 NOTE — Telephone Encounter (Signed)
Dr. Vaughan Browner which Memory Dance did you want to start patient on ?   Are you okay with HRCT chest being completed 10/19/20 or would you like sooner?

## 2020-09-23 NOTE — Telephone Encounter (Signed)
ATC patient cell #, reached VM again.  ATC patient at home #, left vm to return call regarding lab results.

## 2020-09-24 ENCOUNTER — Other Ambulatory Visit: Payer: Self-pay

## 2020-09-24 ENCOUNTER — Ambulatory Visit
Admission: RE | Admit: 2020-09-24 | Discharge: 2020-09-24 | Disposition: A | Discharge status: Home / Self Care | Payer: 59 | Source: Ambulatory Visit | Attending: Pulmonary Disease | Admitting: Pulmonary Disease

## 2020-09-24 DIAGNOSIS — R0609 Other forms of dyspnea: Secondary | ICD-10-CM

## 2020-09-24 DIAGNOSIS — R0600 Dyspnea, unspecified: Secondary | ICD-10-CM

## 2020-09-24 DIAGNOSIS — R7989 Other specified abnormal findings of blood chemistry: Secondary | ICD-10-CM

## 2020-09-24 MED ORDER — IOPAMIDOL (ISOVUE-370) INJECTION 76%
75.0000 mL | Freq: Once | INTRAVENOUS | Status: AC | PRN
  Administered 2020-09-24: 75 mL via INTRAVENOUS

## 2020-09-24 NOTE — Telephone Encounter (Signed)
I called [facility] - they scheduled pt for this afternoon at 1:40 at [employer] location.  He needs to arrive at 1:20, npo 4 hrs prior.  Please give info to pt when he returns nurses call.

## 2020-09-24 NOTE — Telephone Encounter (Signed)
Called and spoke with patient to see if he was available to go to get CT done today at 1:40. He said that he was. Provided him with address and appointment info and to be NPO till after its done. Patient expressed understanding. I asked him about Memory Dance and he said that he was able to pick it up yesterday. Advised him to call if he needed anything else. Nothing further needed at this time.

## 2020-09-25 NOTE — Telephone Encounter (Signed)
Rec'd completed paperwork back - sent email to Kathlee Nations to drop charge -pr

## 2020-09-28 DIAGNOSIS — Z0289 Encounter for other administrative examinations: Secondary | ICD-10-CM

## 2020-09-29 ENCOUNTER — Other Ambulatory Visit: Payer: Self-pay | Admitting: Interventional Cardiology

## 2020-09-29 NOTE — Telephone Encounter (Signed)
Marvin Munoz dropped charge - called and spoke to pt - pt paid fee over the phone - faxed to [employer] to [phone]

## 2020-10-16 ENCOUNTER — Telehealth: Payer: Self-pay | Admitting: Pulmonary Disease

## 2020-10-16 NOTE — Telephone Encounter (Signed)
Spoke with the pt  He is asking if he needs to continue his breo, pred and bactrim until next appt  Per last ov 09/22/20:  Start prednisone 40 mg a day and Bactrim double strength 3 times per week We will start an inhaler called Breo  Advised pt to continue these meds as planned  He verbalized understanding and nothing further needed

## 2020-10-19 ENCOUNTER — Telehealth: Payer: Self-pay | Admitting: Pulmonary Disease

## 2020-10-19 ENCOUNTER — Ambulatory Visit (HOSPITAL_COMMUNITY): Payer: 59

## 2020-10-19 NOTE — Telephone Encounter (Signed)
Called and spoke with patient who states that he needs to be scheduled for a covid test on 5/6 for his PFT on 5/9. Advised patient that he cannot be scheduled to be tested at [facility] unless he is having procedure done there. He expressed understanding. Patient stated that he would come to [facility] testing site. Address was provided and patient has been scheduled for 10/23/20 at 9:05. Nothing further needed at this time.

## 2020-10-23 ENCOUNTER — Other Ambulatory Visit (HOSPITAL_COMMUNITY)
Admission: RE | Admit: 2020-10-23 | Discharge: 2020-10-23 | Disposition: A | Discharge status: Home / Self Care | Payer: 59 | Source: Ambulatory Visit | Attending: Pulmonary Disease | Admitting: Pulmonary Disease

## 2020-10-23 DIAGNOSIS — Z20822 Contact with and (suspected) exposure to covid-19: Secondary | ICD-10-CM | POA: Insufficient documentation

## 2020-10-23 DIAGNOSIS — Z01812 Encounter for preprocedural laboratory examination: Secondary | ICD-10-CM | POA: Diagnosis present

## 2020-10-23 LAB — SARS CORONAVIRUS 2 (TAT 6-24 HRS): SARS Coronavirus 2: NEGATIVE

## 2020-10-26 ENCOUNTER — Other Ambulatory Visit: Payer: Self-pay

## 2020-10-26 ENCOUNTER — Ambulatory Visit (INDEPENDENT_AMBULATORY_CARE_PROVIDER_SITE_OTHER): Payer: 59 | Admitting: Pulmonary Disease

## 2020-10-26 DIAGNOSIS — R0600 Dyspnea, unspecified: Secondary | ICD-10-CM | POA: Diagnosis not present

## 2020-10-26 DIAGNOSIS — U099 Post covid-19 condition, unspecified: Secondary | ICD-10-CM

## 2020-10-26 DIAGNOSIS — R0609 Other forms of dyspnea: Secondary | ICD-10-CM

## 2020-10-26 LAB — PULMONARY FUNCTION TEST
DL/VA % pred: 96 %
DL/VA: 4.02 ml/min/mmHg/L
DLCO cor % pred: 85 %
DLCO cor: 27.01 ml/min/mmHg
DLCO unc % pred: 81 %
DLCO unc: 25.71 ml/min/mmHg
FEF 25-75 Post: 5.95 L/sec
FEF 25-75 Pre: 4.54 L/sec
FEF2575-%Change-Post: 31 %
FEF2575-%Pred-Post: 174 %
FEF2575-%Pred-Pre: 133 %
FEV1-%Change-Post: 9 %
FEV1-%Pred-Post: 96 %
FEV1-%Pred-Pre: 88 %
FEV1-Post: 4.06 L
FEV1-Pre: 3.72 L
FEV1FVC-%Change-Post: 0 %
FEV1FVC-%Pred-Pre: 110 %
FEV6-%Change-Post: 9 %
FEV6-%Pred-Post: 90 %
FEV6-%Pred-Pre: 82 %
FEV6-Post: 4.81 L
FEV6-Pre: 4.38 L
FEV6FVC-%Change-Post: 1 %
FEV6FVC-%Pred-Post: 104 %
FEV6FVC-%Pred-Pre: 103 %
FVC-%Change-Post: 8 %
FVC-%Pred-Post: 86 %
FVC-%Pred-Pre: 79 %
FVC-Post: 4.81 L
FVC-Pre: 4.43 L
Post FEV1/FVC ratio: 84 %
Post FEV6/FVC ratio: 100 %
Pre FEV1/FVC ratio: 84 %
Pre FEV6/FVC Ratio: 99 %
RV % pred: 113 %
RV: 2.83 L
TLC % pred: 94 %
TLC: 7.45 L

## 2020-10-26 NOTE — Progress Notes (Signed)
PFT done today. 

## 2020-11-02 ENCOUNTER — Other Ambulatory Visit: Payer: Self-pay

## 2020-11-02 ENCOUNTER — Encounter: Payer: Self-pay | Admitting: Pulmonary Disease

## 2020-11-02 ENCOUNTER — Ambulatory Visit: Payer: 59 | Admitting: Pulmonary Disease

## 2020-11-02 VITALS — BP 130/72 | HR 65 | Temp 97.8°F | Ht 75.0 in | Wt 195.8 lb

## 2020-11-02 DIAGNOSIS — U099 Post covid-19 condition, unspecified: Secondary | ICD-10-CM

## 2020-11-02 NOTE — Progress Notes (Signed)
Marvin Munoz    [id number]    30-Jun-1960  Primary Care Physician:Gates, Herbie [place], MD  Referring Physician: Josetta Huddle, MD [address] [place],  [place] [address]  Chief complaint: Follow-up for post COVID-58  HPI: 60 year old with mild asthma not on inhalers, thoracic aneurysm, aortic regurgitation Developed COVID-19 in February 2022.  He received monoclonal antibody therapy on 2/16.  Did not require hospitalization CTA done by cardiologist as routine for thoracic aneurysm showed bilateral infiltrates consistent with COVID-19.  Follow-up imaging shows persistent pulmonary infiltrates on chest x-ray  He has persistent dyspnea on exertion, dry cough since then.  Treated with 2 rounds of prednisone for 7 days each and 2 rounds of antibiotics, given albuterol rescue inhaler without change.  Denies any fevers, chills, wheeze Currently on short-term disability from his job  Pets: No pets Occupation: Designer, industrial/product for [employer] Exposures: No mold, hot tub, Jacuzzi, no feather pillows or comforter Smoking history: Never smoker Travel history: No significant travel history Relevant family history: Mom had COPD.  She was a smoker.  Interim history: Here for review of CT and PFTs.  States that breathing is slowly improving but still has dyspnea on exertion  He has started the [facility] inhaler but cannot tell if it is helping Continues on prednisone at 40 mg and Bactrim prophylaxis  Outpatient Encounter Medications as of 11/02/2020  Medication Sig  . allopurinol (ZYLOPRIM) 300 MG tablet Take 150 mg by mouth every evening.   Marland Kitchen aspirin EC 81 MG tablet Take 81 mg by mouth daily.  . baclofen (LIORESAL) 10 MG tablet Take 1 tablet (10 mg total) by mouth 3 (three) times daily. As needed for muscle spasm  . Calcium Carb-Cholecalciferol (CALCIUM + D3 PO) Take 1 tablet by mouth daily.   . cetirizine (ZYRTEC) 10 MG tablet Take 10 mg by mouth every morning.    . diltiazem (CARDIZEM CD) 240 MG 24 hr capsule Take 240 mg by mouth every evening.   Marland Kitchen FIBER PO Take 1 capsule by mouth daily.  . fluticasone furoate-vilanterol (BREO ELLIPTA) 200-25 MCG/INH AEPB Inhale 1 puff into the lungs daily.  . Multiple Vitamin (MULTIVITAMIN) tablet Take 1 tablet by mouth daily.  Marland Kitchen omeprazole (PRILOSEC) 40 MG capsule Take 40 mg by mouth every morning.   . predniSONE (DELTASONE) 10 MG tablet Take 4 tablets (40 mg total) by mouth daily with breakfast.  . rizatriptan (MAXALT-MLT) 10 MG disintegrating tablet Take 10 mg by mouth as needed for migraine. May repeat in 2 hours if needed  . rOPINIRole (REQUIP) 1 MG tablet Take 2 mg by mouth daily.  . rosuvastatin (CRESTOR) 5 MG tablet TAKE (1) TABLET BY MOUTH ONCE DAILY.  Marland Kitchen sulfamethoxazole-trimethoprim (BACTRIM DS) 800-160 MG tablet Take 1 tablet by mouth 3 (three) times a week.  . valACYclovir (VALTREX) 1000 MG tablet Take 1,000 mg by mouth daily as needed (for cold sores).   . zolpidem (AMBIEN) 10 MG tablet Take 10 mg by mouth as needed.  . [DISCONTINUED] pramipexole (MIRAPEX) 0.5 MG tablet Take 0.5 mg by mouth at bedtime.    No facility-administered encounter medications on file as of 11/02/2020.   Physical Exam: Blood pressure 130/72, pulse 65, temperature 97.8 F (36.6 C), temperature source Temporal, height 6\' 3"  (1.905 m), weight 195 lb 12.8 oz (88.8 kg), SpO2 98 %. Gen:      No acute distress HEENT:  EOMI, sclera anicteric Neck:  No masses; no thyromegaly Lungs:    Clear to auscultation bilaterally; normal respiratory effort CV:         Regular rate and rhythm; no murmurs Abd:      + bowel sounds; soft, non-tender; no palpable masses, no distension Ext:    No edema; adequate peripheral perfusion Skin:      Warm and dry; no rash Neuro: alert and oriented x 3 Psych: normal mood and affect  Data Reviewed: Imaging: CTA 08/17/2020-calcification of the aortic valve, multifocal peripheral groundglass opacities  bilaterally. Chest x-ray 09/07/2020-persistent bilateral infiltrates Chest x-ray 09/14/2020-slight improvement in lung infiltrate CTA 09/24/2020- no PE, improvement in bilateral groundglass opacities, coronary artery disease I have reviewed the images personally.  PFTs: 10/26/2020 FVC 4.81 [86%], FEV1 4.06 [96%], F/F 84, TLC 7.45 [94%], DLCO 25.71 [81%] Normal test  Labs: CBC 09/22/2020-WBC 4, eos 1.8%, absolute eosinophil count 72 proBNP 09/23/2018 2-101 D-dimer 09/22/2020-1.65  Cardiac: Echocardiogram 04/15/2019-LVEF 60 to 65%, mild LVH normal RV size and function, mild to moderate aortic regurgitation  Assessment:  Post COVID-19 No CTA.  He has improving infiltrates though still has residual symptoms He is scheduled to start pulmonary rehab next week  Start tapering prednisone by 10 mg every week, stop Bactrim once prednisone dose is below 20 mg Continue disability paperwork from work  He does not have any obstruction on PFTs and can stop the Breo inhaler  Plan/Recommendations: Pred taper Stop Breo Pulmonary rehab  Marshell Garfinkel MD [facility] 11/02/2020, 8:54 AM  CC: Josetta Huddle, MD

## 2020-11-02 NOTE — Patient Instructions (Signed)
I am glad you are starting to feel better.  The CT shows improving inflammation of the lung and pulmonary function tests are normal Start pulmonary rehab We can extend your disability for 3 more months  Start prednisone taper to 30 mg for a week, then 20 mg for a week, then 10 mg for a week, then 5 mg for a week and then stop Can stop the Bactrim once prednisone dose is below 20 mg  He can also stop the Breo inhaler  Follow-up in 3 months

## 2020-11-03 ENCOUNTER — Telehealth: Payer: Self-pay | Admitting: Pulmonary Disease

## 2020-11-04 ENCOUNTER — Other Ambulatory Visit: Payer: Self-pay | Admitting: Interventional Cardiology

## 2020-11-04 NOTE — Telephone Encounter (Signed)
Rec'd completed forms -faxed to [employer] at [phone]

## 2020-11-05 ENCOUNTER — Encounter (HOSPITAL_COMMUNITY): Payer: Self-pay

## 2020-11-05 ENCOUNTER — Other Ambulatory Visit: Payer: Self-pay

## 2020-11-05 ENCOUNTER — Encounter (HOSPITAL_COMMUNITY)
Admission: RE | Admit: 2020-11-05 | Discharge: 2020-11-05 | Disposition: A | Discharge status: Home / Self Care | Payer: 59 | Source: Ambulatory Visit | Attending: Pulmonary Disease | Admitting: Pulmonary Disease

## 2020-11-05 VITALS — BP 110/70 | HR 83 | Ht 75.0 in | Wt 195.3 lb

## 2020-11-05 DIAGNOSIS — R0600 Dyspnea, unspecified: Secondary | ICD-10-CM | POA: Insufficient documentation

## 2020-11-05 DIAGNOSIS — R0609 Other forms of dyspnea: Secondary | ICD-10-CM

## 2020-11-05 DIAGNOSIS — U099 Post covid-19 condition, unspecified: Secondary | ICD-10-CM | POA: Insufficient documentation

## 2020-11-05 NOTE — Progress Notes (Signed)
Pulmonary Individual Treatment Plan  Patient Details  Name: Marvin Munoz MRN: [id number] Date of Birth: 29-Jun-1960 Referring Provider:   Flowsheet Row PULMONARY REHAB OTHER RESP ORIENTATION from 11/05/2020 in [place]  Referring Provider Dr. Vaughan Browner      Initial Encounter Date:  Flowsheet Row PULMONARY REHAB OTHER RESP ORIENTATION from 11/05/2020 in [place]  Date 11/05/20      Visit Diagnosis: Dyspnea on exertion  Post-COVID-19 syndrome  Patient's Home Medications on Admission:   Current Outpatient Medications:  .  allopurinol (ZYLOPRIM) 300 MG tablet, Take 150 mg by mouth every evening. , Disp: , Rfl:  .  aspirin EC 81 MG tablet, Take 81 mg by mouth daily., Disp: , Rfl:  .  baclofen (LIORESAL) 10 MG tablet, Take 1 tablet (10 mg total) by mouth 3 (three) times daily. As needed for muscle spasm, Disp: 50 tablet, Rfl: 0 .  Calcium Carb-Cholecalciferol (CALCIUM + D3 PO), Take 1 tablet by mouth daily. , Disp: , Rfl:  .  cetirizine (ZYRTEC) 10 MG tablet, Take 10 mg by mouth every morning. , Disp: , Rfl:  .  diltiazem (CARDIZEM CD) 240 MG 24 hr capsule, Take 240 mg by mouth every evening. , Disp: , Rfl:  .  FIBER PO, Take 1 capsule by mouth daily., Disp: , Rfl:  .  fluticasone furoate-vilanterol (BREO ELLIPTA) 200-25 MCG/INH AEPB, Inhale 1 puff into the lungs daily., Disp: 60 each, Rfl: 6 .  Multiple Vitamin (MULTIVITAMIN) tablet, Take 1 tablet by mouth daily., Disp: , Rfl:  .  omeprazole (PRILOSEC) 40 MG capsule, Take 40 mg by mouth every morning. , Disp: , Rfl:  .  predniSONE (DELTASONE) 10 MG tablet, Take 4 tablets (40 mg total) by mouth daily with breakfast., Disp: 120 tablet, Rfl: 2 .  rizatriptan (MAXALT-MLT) 10 MG disintegrating tablet, Take 10 mg by mouth as needed for migraine. May repeat in 2 hours if needed, Disp: , Rfl:  .  rOPINIRole (REQUIP) 1 MG tablet, Take 2 mg by mouth daily., Disp: , Rfl:  .  rosuvastatin (CRESTOR) 5 MG  tablet, TAKE (1) TABLET BY MOUTH ONCE DAILY., Disp: 30 tablet, Rfl: 10 .  sulfamethoxazole-trimethoprim (BACTRIM DS) 800-160 MG tablet, Take 1 tablet by mouth 3 (three) times a week., Disp: 12 tablet, Rfl: 2 .  valACYclovir (VALTREX) 1000 MG tablet, Take 1,000 mg by mouth daily as needed (for cold sores). , Disp: , Rfl:  .  zolpidem (AMBIEN) 10 MG tablet, Take 10 mg by mouth as needed., Disp: , Rfl:   Past Medical History: Past Medical History:  Diagnosis Date  . Allergic rhinitis   . Eczema   . Fever blister    occasional  . Foot drop, right 09/24/2014  . Gastroesophageal reflux   . Gout    05-09-2018 per pt last flare up, 2014  . Guillain Barr syndrome ([place]) 1970's   per pt received swine flu vaccination and regular flu vaccination at same time, was told one or the other may have caused guillain barre  . HTN (hypertension)   . Migraine headache   . Mild aortic valve stenosis cardiologist-  dr Daneen Schick   per last echo  02-01-2017   moderately thickended and calcificed leaflets with mild AS and mild AR,  valve area 2.43cm^2  . 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 Nocturia   . Nocturnal  leg cramps   . OA (osteoarthritis)    back,  left knee  . PONV (postoperative nausea and vomiting)   . PVC's (premature ventricular contractions)    hx of benign  . Restless legs syndrome (RLS)   . Thoracic ascending aortic aneurysm [facility])    followed by dr h. Tamala Julian--- per last CTA 02-21-2018 , 53mm    Tobacco Use: Social History   Tobacco Use  Smoking Status Never Smoker  Smokeless Tobacco Never Used    Labs: Recent Review Scientist, physiological    Labs for ITP Cardiac and Pulmonary Rehab Latest Ref Rng & Units 06/18/2019   Cholestrol 100 - 199 mg/dL 153   LDLCALC 0 - 99 mg/dL 85   HDL >39 mg/dL 48   Trlycerides 0 - 149 mg/dL 108      Capillary Blood Glucose: No results  found for: GLUCAP   Pulmonary Assessment Scores:  Pulmonary Assessment Scores    Row Name 11/05/20 1247         ADL UCSD   SOB Score total 64           CAT Score   CAT Score 18           mMRC Score   mMRC Score 1           UCSD: Self-administered rating of dyspnea associated with activities of daily living (ADLs) 6-point scale (0 = "not at all" to 5 = "maximal or unable to do because of breathlessness")  Scoring Scores range from 0 to 120.  Minimally important difference is 5 units  CAT: CAT can identify the health impairment of COPD patients and is better correlated with disease progression.  CAT has a scoring range of zero to 40. The CAT score is classified into four groups of low (less than 10), medium (10 - 20), high (21-30) and very high (31-40) based on the impact level of disease on health status. A CAT score over 10 suggests significant symptoms.  A worsening CAT score could be explained by an exacerbation, poor medication adherence, poor inhaler technique, or progression of COPD or comorbid conditions.  CAT MCID is 2 points  mMRC: mMRC (Modified Medical Research Council) Dyspnea Scale is used to assess the degree of baseline functional disability in patients of respiratory disease due to dyspnea. No minimal important difference is established. A decrease in score of 1 point or greater is considered a positive change.   Pulmonary Function Assessment:   Exercise Target Goals: Exercise Program Goal: Individual exercise prescription set using results from initial 6 min walk test and THRR while considering  patient's activity barriers and safety.   Exercise Prescription Goal: Initial exercise prescription builds to 30-45 minutes a day of aerobic activity, 2-3 days per week.  Home exercise guidelines will be given to patient during program as part of exercise prescription that the participant will acknowledge.  Activity Barriers & Risk Stratification:  Activity  Barriers & Cardiac Risk Stratification - 11/05/20 1237      Activity Barriers & Cardiac Risk Stratification   Activity Barriers Arthritis;Back Problems;Left Knee Replacement;Deconditioning;Shortness of Breath    Cardiac Risk Stratification Moderate           6 Minute Walk:  6 Minute Walk    Row Name 11/05/20 1342         6 Minute Walk   Phase Initial     Distance 1550 feet     Walk Time 6 minutes     # of  Rest Breaks 0     MPH 2.94     METS 4.2     RPE 12     Perceived Dyspnea  13     VO2 Peak 14.69     Symptoms No     Resting HR 83 bpm     Resting BP 110/70     Resting Oxygen Saturation  97 %     Exercise Oxygen Saturation  during 6 min walk 94 %     Max Ex. HR 95 bpm     Max Ex. BP 134/68     2 Minute Post BP 104/70           Interval HR   1 Minute HR 92     2 Minute HR 92     3 Minute HR 88     4 Minute HR 92     5 Minute HR 92     6 Minute HR 95     2 Minute Post HR 81     Interval Heart Rate? Yes           Interval Oxygen   Interval Oxygen? Yes     Baseline Oxygen Saturation % 97 %     1 Minute Oxygen Saturation % 95 %     1 Minute Liters of Oxygen 0 L     2 Minute Oxygen Saturation % 96 %     2 Minute Liters of Oxygen 0 L     3 Minute Oxygen Saturation % 94 %     3 Minute Liters of Oxygen 0 L     4 Minute Oxygen Saturation % 96 %     4 Minute Liters of Oxygen 0 L     5 Minute Oxygen Saturation % 96 %     5 Minute Liters of Oxygen 0 L     6 Minute Oxygen Saturation % 98 %     6 Minute Liters of Oxygen 0 L     2 Minute Post Oxygen Saturation % 98 %     2 Minute Post Liters of Oxygen 0 L            Oxygen Initial Assessment:  Oxygen Initial Assessment - 11/05/20 1342      Initial 6 min Walk   Oxygen Used None      Program Oxygen Prescription   Program Oxygen Prescription None      Intervention   Short Term Goals To learn and exhibit compliance with exercise, home and travel O2 prescription;To learn and understand importance of  monitoring SPO2 with pulse oximeter and demonstrate accurate use of the pulse oximeter.;To learn and understand importance of maintaining oxygen saturations>88%;To learn and demonstrate proper pursed lip breathing techniques or other breathing techniques.;To learn and demonstrate proper use of respiratory medications    Long  Term Goals Exhibits compliance with exercise, home and travel O2 prescription;Verbalizes importance of monitoring SPO2 with pulse oximeter and return demonstration;Maintenance of O2 saturations>88%;Exhibits proper breathing techniques, such as pursed lip breathing or other method taught during program session;Compliance with respiratory medication           Oxygen Re-Evaluation:   Oxygen Discharge (Final Oxygen Re-Evaluation):   Initial Exercise Prescription:  Initial Exercise Prescription - 11/05/20 1300      Date of Initial Exercise RX and Referring Provider   Date 11/05/20    Referring Provider Dr. Vaughan Browner    Expected Discharge Date 03/11/21      Treadmill  MPH 1.5    Grade 0    Minutes 17      Recumbant Elliptical   Level 1    RPM 60    Minutes 22      Prescription Details   Frequency (times per week) 2    Duration Progress to 30 minutes of continuous aerobic without signs/symptoms of physical distress      Intensity   THRR 40-80% of Max Heartrate 64-128    Ratings of Perceived Exertion 11-13    Perceived Dyspnea 0-4      Resistance Training   Training Prescription Yes    Weight 4 lbs    Reps 10-15           Perform Capillary Blood Glucose checks as needed.  Exercise Prescription Changes:   Exercise Comments:   Exercise Goals and Review:  Exercise Goals    Row Name 11/05/20 1345             Exercise Goals   Increase Physical Activity Yes       Intervention Provide advice, education, support and counseling about physical activity/exercise needs.;Develop an individualized exercise prescription for aerobic and resistive  training based on initial evaluation findings, risk stratification, comorbidities and participant's personal goals.       Expected Outcomes Short Term: Attend rehab on a regular basis to increase amount of physical activity.;Long Term: Add in home exercise to make exercise part of routine and to increase amount of physical activity.;Long Term: Exercising regularly at least 3-5 days a week.       Increase Strength and Stamina Yes       Intervention Provide advice, education, support and counseling about physical activity/exercise needs.;Develop an individualized exercise prescription for aerobic and resistive training based on initial evaluation findings, risk stratification, comorbidities and participant's personal goals.       Expected Outcomes Short Term: Increase workloads from initial exercise prescription for resistance, speed, and METs.;Short Term: Perform resistance training exercises routinely during rehab and add in resistance training at home;Long Term: Improve cardiorespiratory fitness, muscular endurance and strength as measured by increased METs and functional capacity (6MWT)       Able to understand and use rate of perceived exertion (RPE) scale Yes       Intervention Provide education and explanation on how to use RPE scale       Expected Outcomes Short Term: Able to use RPE daily in rehab to express subjective intensity level;Long Term:  Able to use RPE to guide intensity level when exercising independently       Able to understand and use Dyspnea scale Yes       Intervention Provide education and explanation on how to use Dyspnea scale       Expected Outcomes Short Term: Able to use Dyspnea scale daily in rehab to express subjective sense of shortness of breath during exertion;Long Term: Able to use Dyspnea scale to guide intensity level when exercising independently       Knowledge and understanding of Target Heart Rate Range (THRR) Yes       Intervention Provide education and  explanation of THRR including how the numbers were predicted and where they are located for reference       Expected Outcomes Short Term: Able to state/look up THRR;Long Term: Able to use THRR to govern intensity when exercising independently;Short Term: Able to use daily as guideline for intensity in rehab       Understanding of Exercise Prescription Yes  Intervention Provide education, explanation, and written materials on patient's individual exercise prescription       Expected Outcomes Short Term: Able to explain program exercise prescription;Long Term: Able to explain home exercise prescription to exercise independently              Exercise Goals Re-Evaluation :   Discharge Exercise Prescription (Final Exercise Prescription Changes):   Nutrition:  Target Goals: Understanding of nutrition guidelines, daily intake of sodium 1500mg , cholesterol 200mg , calories 30% from fat and 7% or less from saturated fats, daily to have 5 or more servings of fruits and vegetables.  Biometrics:  Pre Biometrics - 11/05/20 1346      Pre Biometrics   Height 6\' 3"  (1.905 m)    Weight 195 lb 5.2 oz (88.6 kg)    Waist Circumference 40.5 inches    Hip Circumference 40.5 inches    Waist to Hip Ratio 1 %    BMI (Calculated) 24.41    Triceps Skinfold 22 mm    % Body Fat 27.4 %    Grip Strength 46.3 kg    Flexibility 21.5 in    Single Leg Stand 60 seconds            Nutrition Therapy Plan and Nutrition Goals:   Nutrition Assessments:  MEDIFICTS Score Key:  ?70 Need to make dietary changes   40-70 Heart Healthy Diet  ? 40 Therapeutic Level Cholesterol Diet   Picture Your Plate Scores:  D34-534 Unhealthy dietary pattern with much room for improvement.  41-50 Dietary pattern unlikely to meet recommendations for good health and room for improvement.  51-60 More healthful dietary pattern, with some room for improvement.   >60 Healthy dietary pattern, although there may be some  specific behaviors that could be improved.    Nutrition Goals Re-Evaluation:   Nutrition Goals Discharge (Final Nutrition Goals Re-Evaluation):   Psychosocial: Target Goals: Acknowledge presence or absence of significant depression and/or stress, maximize coping skills, provide positive support system. Participant is able to verbalize types and ability to use techniques and skills needed for reducing stress and depression.  Initial Review & Psychosocial Screening:  Initial Psych Review & Screening - 11/05/20 1239      Initial Review   Current issues with Current Sleep Concerns   Has trouble sleeping due to being on prednisone     Family Dynamics   Good Support System? Yes    Comments His wife is his main support system. He also has a mother, daughter, and son who support him.      Barriers   Psychosocial barriers to participate in program There are no identifiable barriers or psychosocial needs.      Screening Interventions   Interventions Encouraged to exercise    Expected Outcomes Long Term goal: The participant improves quality of Life and PHQ9 Scores as seen by post scores and/or verbalization of changes;Short Term goal: Identification and review with participant of any Quality of Life or Depression concerns found by scoring the questionnaire.           Quality of Life Scores:  Quality of Life - 11/05/20 1346      Quality of Life   Select Quality of Life      Quality of Life Scores   Health/Function Pre 16.88 %    Socioeconomic Pre 23.71 %    Psych/Spiritual Pre 27.43 %    Family Pre 27.6 %    GLOBAL Pre 21.89 %  Scores of 19 and below usually indicate a poorer quality of life in these areas.  A difference of  2-3 points is a clinically meaningful difference.  A difference of 2-3 points in the total score of the Quality of Life Index has been associated with significant improvement in overall quality of life, self-image, physical symptoms, and general  health in studies assessing change in quality of life.   PHQ-9: Recent Review Flowsheet Data    Depression screen [facility] 2/9 11/05/2020 09/25/2018 07/02/2018 07/02/2018   Decreased Interest 2 0 0 0   Down, Depressed, Hopeless 1 0 0 0   PHQ - 2 Score 3 0 0 0   Altered sleeping 3 - - -   Tired, decreased energy 3 - - -   Change in appetite 1 - - -   Feeling bad or failure about yourself  0 - - -   Trouble concentrating 0 - - -   Moving slowly or fidgety/restless 0 - - -   Suicidal thoughts 0 - - -   PHQ-9 Score 10 - - -   Difficult doing work/chores Very difficult - - -     Interpretation of Total Score  Total Score Depression Severity:  1-4 = Minimal depression, 5-9 = Mild depression, 10-14 = Moderate depression, 15-19 = Moderately severe depression, 20-27 = Severe depression   Psychosocial Evaluation and Intervention:  Psychosocial Evaluation - 11/05/20 1347      Psychosocial Evaluation & Interventions   Interventions Encouraged to exercise with the program and follow exercise prescription    Comments Pt has no barriers to completing rehab. Pt has no identifiable psychosocial issues. He has been having some issues sleeping which he attributes to being on prednisone since February. He is beginning to taper his dosage down, so he is hopeful that this will improve his sleep. His PHQ-9 score was a 10. 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 He was previously very active, but now he is unable to do much without becoming fatigued. He copes well. His support system is strong with his wife, children, and mother. He is hopeful that the pulmonary rehab program will help him get back to his previous physical functioning. His goal is to decrease his SOB and to gain his stamina back. He would like to go back to playing golf and would like to be strong enough to return to work in August. He is excited to start the program.    Expected Outcomes The patient will continue to not have  any identifiable psychosocial issues.    Continue Psychosocial Services  No Follow up required           Psychosocial Re-Evaluation:   Psychosocial Discharge (Final Psychosocial Re-Evaluation):    Education: Education Goals: Education classes will be provided on a weekly basis, covering required topics. Participant will state understanding/return demonstration of topics presented.  Learning Barriers/Preferences:  Learning Barriers/Preferences - 11/05/20 1245      Learning Barriers/Preferences   Learning Barriers None    Learning Preferences Skilled Demonstration           Education Topics: How Lungs Work and Diseases: - Discuss the anatomy of the lungs and diseases that can affect the lungs, such as COPD.   Exercise: -Discuss the importance of exercise, FITT principles of exercise, normal and abnormal responses to exercise, and how to exercise safely.   Environmental Irritants: -Discuss types of environmental irritants and how to limit exposure to environmental irritants.  Meds/Inhalers and oxygen: - Discuss respiratory medications, definition of an inhaler and oxygen, and the proper way to use an inhaler and oxygen.   Energy Saving Techniques: - Discuss methods to conserve energy and decrease shortness of breath when performing activities of daily living.    Bronchial Hygiene / Breathing Techniques: - Discuss breathing mechanics, pursed-lip breathing technique,  proper posture, effective ways to clear airways, and other functional breathing techniques   Cleaning Equipment: - Provides group verbal and written instruction about the health risks of elevated stress, cause of high stress, and healthy ways to reduce stress.   Nutrition I: Fats: - Discuss the types of cholesterol, what cholesterol does to the body, and how cholesterol levels can be controlled.   Nutrition II: Labels: -Discuss the different components of food labels and how to read food  labels.   Respiratory Infections: - Discuss the signs and symptoms of respiratory infections, ways to prevent respiratory infections, and the importance of seeking medical treatment when having a respiratory infection.   Stress I: Signs and Symptoms: - Discuss the causes of stress, how stress may lead to anxiety and depression, and ways to limit stress.   Stress II: Relaxation: -Discuss relaxation techniques to limit stress.   Oxygen for Home/Travel: - Discuss how to prepare for travel when on oxygen and proper ways to transport and store oxygen to ensure safety.   Knowledge Questionnaire Score:  Knowledge Questionnaire Score - 11/05/20 1246      Knowledge Questionnaire Score   Pre Score 16/18           Core Components/Risk Factors/Patient Goals at Admission:  Personal Goals and Risk Factors at Admission - 11/05/20 1246      Core Components/Risk Factors/Patient Goals on Admission    Weight Management Yes;Weight Maintenance;Weight Loss    Intervention Weight Management: Develop a combined nutrition and exercise program designed to reach desired caloric intake, while maintaining appropriate intake of nutrient and fiber, sodium and fats, and appropriate energy expenditure required for the weight goal.;Weight Management: Provide education and appropriate resources to help participant work on and attain dietary goals.;Weight Management/Obesity: Establish reasonable short term and long term weight goals.    Expected Outcomes Short Term: Continue to assess and modify interventions until short term weight is achieved;Long Term: Adherence to nutrition and physical activity/exercise program aimed toward attainment of established weight goal;Weight Maintenance: Understanding of the daily nutrition guidelines, which includes 25-35% calories from fat, 7% or less cal from saturated fats, less than 200mg  cholesterol, less than 1.5gm of sodium, & 5 or more servings of fruits and vegetables  daily;Weight Loss: Understanding of general recommendations for a balanced deficit meal plan, which promotes 1-2 lb weight loss per week and includes a negative energy balance of [phone] kcal/d;Understanding recommendations for meals to include 15-35% energy as protein, 25-35% energy from fat, 35-60% energy from carbohydrates, less than 200mg  of dietary cholesterol, 20-35 gm of total fiber daily;Understanding of distribution of calorie intake throughout the day with the consumption of 4-5 meals/snacks    Improve shortness of breath with ADL's Yes    Intervention Provide education, individualized exercise plan and daily activity instruction to help decrease symptoms of SOB with activities of daily living.    Expected Outcomes Short Term: Improve cardiorespiratory fitness to achieve a reduction of symptoms when performing ADLs;Long Term: Be able to perform more ADLs without symptoms or delay the onset of symptoms           Core Components/Risk Factors/Patient Goals Review:  Core Components/Risk Factors/Patient Goals at Discharge (Final Review):    ITP Comments:    Comments: Patient arrived for 1st visit/orientation/education at 1230. Patient was referred to PR by  Dr. Vaughan Browner due to Dyspnea on exertion (R06.00) and Post-COVID-19 syndrome (U09.9). During orientation advised patient on arrival and appointment times what to wear, what to do before, during and after exercise. Reviewed attendance and class policy.  Pt is scheduled to return Pulmonary Rehab on 11/10/2020 at 1045. Pt was advised to come to class 15 minutes before class starts.  Discussed RPE/Dpysnea scales. Patient participated in warm up stretches. Patient was able to complete 6 minute walk test. Patient was measured for the equipment. Discussed equipment safety with patient. Took patient pre-anthropometric measurements. Patient finished visit at 1330.

## 2020-11-09 ENCOUNTER — Telehealth: Payer: Self-pay | Admitting: Pulmonary Disease

## 2020-11-09 NOTE — Telephone Encounter (Signed)
Return to work date listed on form (2nd page) 02/02/2021 - approximate - refaxed with return to work dated listed on  Fax cover sheet to [phone] -pr

## 2020-11-10 ENCOUNTER — Other Ambulatory Visit: Payer: Self-pay

## 2020-11-10 ENCOUNTER — Encounter (HOSPITAL_COMMUNITY)
Admission: RE | Admit: 2020-11-10 | Discharge: 2020-11-10 | Disposition: A | Discharge status: Home / Self Care | Payer: 59 | Source: Ambulatory Visit | Attending: Pulmonary Disease | Admitting: Pulmonary Disease

## 2020-11-10 DIAGNOSIS — U099 Post covid-19 condition, unspecified: Secondary | ICD-10-CM

## 2020-11-10 DIAGNOSIS — R0600 Dyspnea, unspecified: Secondary | ICD-10-CM

## 2020-11-10 DIAGNOSIS — R0609 Other forms of dyspnea: Secondary | ICD-10-CM

## 2020-11-10 NOTE — Progress Notes (Signed)
Daily Session Note  Patient Details  Name: Marvin Munoz MRN: [id number] Date of Birth: 07-Sep-1960 Referring Provider:   [place] from 11/05/2020 in [place]  Referring Provider Dr. Vaughan Browner      Encounter Date: 11/10/2020  Check In:  Session Check In - 11/10/20 1045      Check-In   Supervising physician immediately available to respond to emergencies [facility] MD immediately available    Physician(s) Dr. Domenic Polite    Location [facility]    Staff Present Geanie Cooley, RN;Madison Audria Nine, MS, Exercise Physiologist    Virtual Visit No    Medication changes reported     No    Fall or balance concerns reported    No    Tobacco Cessation No Change    Warm-up and Cool-down Performed as group-led instruction    Resistance Training Performed Yes    VAD Patient? No    PAD/SET Patient? No      Pain Assessment   Currently in Pain? No/denies    Multiple Pain Sites No           Capillary Blood Glucose: No results found for this or any previous visit (from the past 24 hour(s)).    Social History   Tobacco Use  Smoking Status Never Smoker  Smokeless Tobacco Never Used    Goals Met:  Proper associated with RPD/PD & O2 Sat Independence with exercise equipment Using PLB without cueing & demonstrates good technique Exercise tolerated well No report of cardiac concerns or symptoms Strength training completed today  Goals Unmet:  Not Applicable  Comments: check out @ 11:45   Dr. Kathie Dike is Medical Director for [facility].

## 2020-11-12 ENCOUNTER — Encounter (HOSPITAL_COMMUNITY): Payer: 59

## 2020-11-17 ENCOUNTER — Other Ambulatory Visit: Payer: Self-pay

## 2020-11-17 ENCOUNTER — Encounter (HOSPITAL_COMMUNITY)
Admission: RE | Admit: 2020-11-17 | Discharge: 2020-11-17 | Disposition: A | Discharge status: Home / Self Care | Payer: 59 | Source: Ambulatory Visit | Attending: Pulmonary Disease | Admitting: Pulmonary Disease

## 2020-11-17 VITALS — Wt 200.4 lb

## 2020-11-17 DIAGNOSIS — R0600 Dyspnea, unspecified: Secondary | ICD-10-CM

## 2020-11-17 DIAGNOSIS — U099 Post covid-19 condition, unspecified: Secondary | ICD-10-CM

## 2020-11-17 DIAGNOSIS — R0609 Other forms of dyspnea: Secondary | ICD-10-CM

## 2020-11-17 NOTE — Progress Notes (Signed)
Daily Session Note  Patient Details  Name: Marvin Munoz MRN: [id number] Date of Birth: 12-14-1960 Referring Provider:   Flowsheet Row PULMONARY REHAB OTHER RESP ORIENTATION from 11/05/2020 in [place]  Referring Provider Dr. Vaughan Browner      Encounter Date: 11/17/2020  Check In:  Session Check In - 11/17/20 1045      Check-In   Supervising physician immediately available to respond to emergencies [facility] MD immediately available    Physician(s) Dr. Harl Bowie    Location [facility]    Staff Present Hoy Register, MS, ACSM-CEP, Exercise Physiologist;Madison Audria Nine, MS, Exercise Physiologist;Phyllis Billingsley, RN    Virtual Visit No    Medication changes reported     No    Fall or balance concerns reported    No    Tobacco Cessation No Change    Warm-up and Cool-down Performed as group-led instruction    Resistance Training Performed Yes    VAD Patient? No    PAD/SET Patient? No      Pain Assessment   Currently in Pain? No/denies    Multiple Pain Sites No           Capillary Blood Glucose: No results found for this or any previous visit (from the past 24 hour(s)).    Social History   Tobacco Use  Smoking Status Never Smoker  Smokeless Tobacco Never Used    Goals Met:  Independence with exercise equipment Exercise tolerated well No report of cardiac concerns or symptoms Strength training completed today  Goals Unmet:  Not Applicable  Comments: checkout time is 1145   Dr. Kathie Dike is Medical Director for [facility].

## 2020-11-19 ENCOUNTER — Encounter (HOSPITAL_COMMUNITY)
Admission: RE | Admit: 2020-11-19 | Discharge: 2020-11-19 | Disposition: A | Discharge status: Home / Self Care | Payer: 59 | Source: Ambulatory Visit | Attending: Pulmonary Disease | Admitting: Pulmonary Disease

## 2020-11-19 ENCOUNTER — Other Ambulatory Visit: Payer: Self-pay

## 2020-11-19 DIAGNOSIS — R0600 Dyspnea, unspecified: Secondary | ICD-10-CM | POA: Insufficient documentation

## 2020-11-19 DIAGNOSIS — R0609 Other forms of dyspnea: Secondary | ICD-10-CM

## 2020-11-19 DIAGNOSIS — U099 Post covid-19 condition, unspecified: Secondary | ICD-10-CM

## 2020-11-19 NOTE — Progress Notes (Signed)
Daily Session Note  Patient Details  Name: Marvin Munoz MRN: [id number] Date of Birth: 1961/01/26 Referring Provider:   Flowsheet Row PULMONARY REHAB OTHER RESP ORIENTATION from 11/05/2020 in [place]  Referring Provider Dr. Vaughan Browner      Encounter Date: 11/19/2020  Check In:  Session Check In - 11/19/20 1045      Check-In   Supervising physician immediately available to respond to emergencies [facility] MD immediately available    Physician(s) Dr. Harl Bowie    Location [facility]    Staff Present Hoy Register, MS, ACSM-CEP, Exercise Physiologist    Virtual Visit No    Medication changes reported     No    Fall or balance concerns reported    No    Tobacco Cessation No Change    Warm-up and Cool-down Performed as group-led instruction    Resistance Training Performed Yes    VAD Patient? No    PAD/SET Patient? No      Pain Assessment   Currently in Pain? No/denies    Multiple Pain Sites No           Capillary Blood Glucose: No results found for this or any previous visit (from the past 24 hour(s)).    Social History   Tobacco Use  Smoking Status Never Smoker  Smokeless Tobacco Never Used    Goals Met:  Independence with exercise equipment Exercise tolerated well No report of cardiac concerns or symptoms Strength training completed today  Goals Unmet:  Not Applicable  Comments: checkout time is 1145   Dr. Kathie Dike is Medical Director for [facility].

## 2020-11-19 NOTE — Progress Notes (Signed)
Pulmonary Individual Treatment Plan  Patient Details  Name: Marvin Munoz MRN: [id number] Date of Birth: 29-Jun-1960 Referring Provider:   Flowsheet Row PULMONARY REHAB OTHER RESP ORIENTATION from 11/05/2020 in [place]  Referring Provider Dr. Vaughan Browner      Initial Encounter Date:  Flowsheet Row PULMONARY REHAB OTHER RESP ORIENTATION from 11/05/2020 in [place]  Date 11/05/20      Visit Diagnosis: Dyspnea on exertion  Post-COVID-19 syndrome  Patient's Home Medications on Admission:   Current Outpatient Medications:  .  allopurinol (ZYLOPRIM) 300 MG tablet, Take 150 mg by mouth every evening. , Disp: , Rfl:  .  aspirin EC 81 MG tablet, Take 81 mg by mouth daily., Disp: , Rfl:  .  baclofen (LIORESAL) 10 MG tablet, Take 1 tablet (10 mg total) by mouth 3 (three) times daily. As needed for muscle spasm, Disp: 50 tablet, Rfl: 0 .  Calcium Carb-Cholecalciferol (CALCIUM + D3 PO), Take 1 tablet by mouth daily. , Disp: , Rfl:  .  cetirizine (ZYRTEC) 10 MG tablet, Take 10 mg by mouth every morning. , Disp: , Rfl:  .  diltiazem (CARDIZEM CD) 240 MG 24 hr capsule, Take 240 mg by mouth every evening. , Disp: , Rfl:  .  FIBER PO, Take 1 capsule by mouth daily., Disp: , Rfl:  .  fluticasone furoate-vilanterol (BREO ELLIPTA) 200-25 MCG/INH AEPB, Inhale 1 puff into the lungs daily., Disp: 60 each, Rfl: 6 .  Multiple Vitamin (MULTIVITAMIN) tablet, Take 1 tablet by mouth daily., Disp: , Rfl:  .  omeprazole (PRILOSEC) 40 MG capsule, Take 40 mg by mouth every morning. , Disp: , Rfl:  .  predniSONE (DELTASONE) 10 MG tablet, Take 4 tablets (40 mg total) by mouth daily with breakfast., Disp: 120 tablet, Rfl: 2 .  rizatriptan (MAXALT-MLT) 10 MG disintegrating tablet, Take 10 mg by mouth as needed for migraine. May repeat in 2 hours if needed, Disp: , Rfl:  .  rOPINIRole (REQUIP) 1 MG tablet, Take 2 mg by mouth daily., Disp: , Rfl:  .  rosuvastatin (CRESTOR) 5 MG  tablet, TAKE (1) TABLET BY MOUTH ONCE DAILY., Disp: 30 tablet, Rfl: 10 .  sulfamethoxazole-trimethoprim (BACTRIM DS) 800-160 MG tablet, Take 1 tablet by mouth 3 (three) times a week., Disp: 12 tablet, Rfl: 2 .  valACYclovir (VALTREX) 1000 MG tablet, Take 1,000 mg by mouth daily as needed (for cold sores). , Disp: , Rfl:  .  zolpidem (AMBIEN) 10 MG tablet, Take 10 mg by mouth as needed., Disp: , Rfl:   Past Medical History: Past Medical History:  Diagnosis Date  . Allergic rhinitis   . Eczema   . Fever blister    occasional  . Foot drop, right 09/24/2014  . Gastroesophageal reflux   . Gout    05-09-2018 per pt last flare up, 2014  . Guillain Barr syndrome ([place]) 1970's   per pt received swine flu vaccination and regular flu vaccination at same time, was told one or the other may have caused guillain barre  . HTN (hypertension)   . Migraine headache   . Mild aortic valve stenosis cardiologist-  dr Daneen Schick   per last echo  02-01-2017   moderately thickended and calcificed leaflets with mild AS and mild AR,  valve area 2.43cm^2  . 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 Nocturia   . Nocturnal  leg cramps   . OA (osteoarthritis)    back,  left knee  . PONV (postoperative nausea and vomiting)   . PVC's (premature ventricular contractions)    hx of benign  . Restless legs syndrome (RLS)   . Thoracic ascending aortic aneurysm [facility])    followed by dr h. Tamala Julian--- per last CTA 02-21-2018 , 53mm    Tobacco Use: Social History   Tobacco Use  Smoking Status Never Smoker  Smokeless Tobacco Never Used    Labs: Recent Review Scientist, physiological    Labs for ITP Cardiac and Pulmonary Rehab Latest Ref Rng & Units 06/18/2019   Cholestrol 100 - 199 mg/dL 153   LDLCALC 0 - 99 mg/dL 85   HDL >39 mg/dL 48   Trlycerides 0 - 149 mg/dL 108      Capillary Blood Glucose: No results  found for: GLUCAP   Pulmonary Assessment Scores:  Pulmonary Assessment Scores    Row Name 11/05/20 1247         ADL UCSD   SOB Score total 64           CAT Score   CAT Score 18           mMRC Score   mMRC Score 1           UCSD: Self-administered rating of dyspnea associated with activities of daily living (ADLs) 6-point scale (0 = "not at all" to 5 = "maximal or unable to do because of breathlessness")  Scoring Scores range from 0 to 120.  Minimally important difference is 5 units  CAT: CAT can identify the health impairment of COPD patients and is better correlated with disease progression.  CAT has a scoring range of zero to 40. The CAT score is classified into four groups of low (less than 10), medium (10 - 20), high (21-30) and very high (31-40) based on the impact level of disease on health status. A CAT score over 10 suggests significant symptoms.  A worsening CAT score could be explained by an exacerbation, poor medication adherence, poor inhaler technique, or progression of COPD or comorbid conditions.  CAT MCID is 2 points  mMRC: mMRC (Modified Medical Research Council) Dyspnea Scale is used to assess the degree of baseline functional disability in patients of respiratory disease due to dyspnea. No minimal important difference is established. A decrease in score of 1 point or greater is considered a positive change.   Pulmonary Function Assessment:   Exercise Target Goals: Exercise Program Goal: Individual exercise prescription set using results from initial 6 min walk test and THRR while considering  patient's activity barriers and safety.   Exercise Prescription Goal: Initial exercise prescription builds to 30-45 minutes a day of aerobic activity, 2-3 days per week.  Home exercise guidelines will be given to patient during program as part of exercise prescription that the participant will acknowledge.  Activity Barriers & Risk Stratification:  Activity  Barriers & Cardiac Risk Stratification - 11/05/20 1237      Activity Barriers & Cardiac Risk Stratification   Activity Barriers Arthritis;Back Problems;Left Knee Replacement;Deconditioning;Shortness of Breath    Cardiac Risk Stratification Moderate           6 Minute Walk:  6 Minute Walk    Row Name 11/05/20 1342         6 Minute Walk   Phase Initial     Distance 1550 feet     Walk Time 6 minutes     # of  Rest Breaks 0     MPH 2.94     METS 4.2     RPE 12     Perceived Dyspnea  13     VO2 Peak 14.69     Symptoms No     Resting HR 83 bpm     Resting BP 110/70     Resting Oxygen Saturation  97 %     Exercise Oxygen Saturation  during 6 min walk 94 %     Max Ex. HR 95 bpm     Max Ex. BP 134/68     2 Minute Post BP 104/70           Interval HR   1 Minute HR 92     2 Minute HR 92     3 Minute HR 88     4 Minute HR 92     5 Minute HR 92     6 Minute HR 95     2 Minute Post HR 81     Interval Heart Rate? Yes           Interval Oxygen   Interval Oxygen? Yes     Baseline Oxygen Saturation % 97 %     1 Minute Oxygen Saturation % 95 %     1 Minute Liters of Oxygen 0 L     2 Minute Oxygen Saturation % 96 %     2 Minute Liters of Oxygen 0 L     3 Minute Oxygen Saturation % 94 %     3 Minute Liters of Oxygen 0 L     4 Minute Oxygen Saturation % 96 %     4 Minute Liters of Oxygen 0 L     5 Minute Oxygen Saturation % 96 %     5 Minute Liters of Oxygen 0 L     6 Minute Oxygen Saturation % 98 %     6 Minute Liters of Oxygen 0 L     2 Minute Post Oxygen Saturation % 98 %     2 Minute Post Liters of Oxygen 0 L            Oxygen Initial Assessment:  Oxygen Initial Assessment - 11/05/20 1342      Initial 6 min Walk   Oxygen Used None      Program Oxygen Prescription   Program Oxygen Prescription None      Intervention   Short Term Goals To learn and exhibit compliance with exercise, home and travel O2 prescription;To learn and understand importance of  monitoring SPO2 with pulse oximeter and demonstrate accurate use of the pulse oximeter.;To learn and understand importance of maintaining oxygen saturations>88%;To learn and demonstrate proper pursed lip breathing techniques or other breathing techniques.;To learn and demonstrate proper use of respiratory medications    Long  Term Goals Exhibits compliance with exercise, home and travel O2 prescription;Verbalizes importance of monitoring SPO2 with pulse oximeter and return demonstration;Maintenance of O2 saturations>88%;Exhibits proper breathing techniques, such as pursed lip breathing or other method taught during program session;Compliance with respiratory medication           Oxygen Re-Evaluation:  Oxygen Re-Evaluation    Row Name 11/17/20 1314             Program Oxygen Prescription   Program Oxygen Prescription None               Home Oxygen   Home Oxygen Device None  Sleep Oxygen Prescription None       Home Exercise Oxygen Prescription None       Home Resting Oxygen Prescription None       Compliance with Home Oxygen Use Yes               Goals/Expected Outcomes   Short Term Goals To learn and exhibit compliance with exercise, home and travel O2 prescription;To learn and understand importance of monitoring SPO2 with pulse oximeter and demonstrate accurate use of the pulse oximeter.;To learn and understand importance of maintaining oxygen saturations>88%;To learn and demonstrate proper pursed lip breathing techniques or other breathing techniques.;To learn and demonstrate proper use of respiratory medications       Long  Term Goals Exhibits compliance with exercise, home and travel O2 prescription;Verbalizes importance of monitoring SPO2 with pulse oximeter and return demonstration;Maintenance of O2 saturations>88%;Exhibits proper breathing techniques, such as pursed lip breathing or other method taught during program session;Compliance with respiratory medication               Oxygen Discharge (Final Oxygen Re-Evaluation):  Oxygen Re-Evaluation - 11/17/20 1314      Program Oxygen Prescription   Program Oxygen Prescription None      Home Oxygen   Home Oxygen Device None    Sleep Oxygen Prescription None    Home Exercise Oxygen Prescription None    Home Resting Oxygen Prescription None    Compliance with Home Oxygen Use Yes      Goals/Expected Outcomes   Short Term Goals To learn and exhibit compliance with exercise, home and travel O2 prescription;To learn and understand importance of monitoring SPO2 with pulse oximeter and demonstrate accurate use of the pulse oximeter.;To learn and understand importance of maintaining oxygen saturations>88%;To learn and demonstrate proper pursed lip breathing techniques or other breathing techniques.;To learn and demonstrate proper use of respiratory medications    Long  Term Goals Exhibits compliance with exercise, home and travel O2 prescription;Verbalizes importance of monitoring SPO2 with pulse oximeter and return demonstration;Maintenance of O2 saturations>88%;Exhibits proper breathing techniques, such as pursed lip breathing or other method taught during program session;Compliance with respiratory medication           Initial Exercise Prescription:  Initial Exercise Prescription - 11/05/20 1300      Date of Initial Exercise RX and Referring Provider   Date 11/05/20    Referring Provider Dr. Vaughan Browner    Expected Discharge Date 03/11/21      Treadmill   MPH 1.5    Grade 0    Minutes 17      Recumbant Elliptical   Level 1    RPM 60    Minutes 22      Prescription Details   Frequency (times per week) 2    Duration Progress to 30 minutes of continuous aerobic without signs/symptoms of physical distress      Intensity   THRR 40-80% of Max Heartrate 64-128    Ratings of Perceived Exertion 11-13    Perceived Dyspnea 0-4      Resistance Training   Training Prescription Yes    Weight 4 lbs     Reps 10-15           Perform Capillary Blood Glucose checks as needed.  Exercise Prescription Changes:  Exercise Prescription Changes    Row Name 11/17/20 1300             Response to Exercise   Blood Pressure (Admit) 120/68  Blood Pressure (Exercise) 146/72       Blood Pressure (Exit) 118/72       Heart Rate (Admit) 81 bpm       Heart Rate (Exercise) 99 bpm       Heart Rate (Exit) 84 bpm       Oxygen Saturation (Admit) 96 %       Oxygen Saturation (Exercise) 96 %       Oxygen Saturation (Exit) 98 %       Rating of Perceived Exertion (Exercise) 12       Perceived Dyspnea (Exercise) 12       Duration Continue with 30 min of aerobic exercise without signs/symptoms of physical distress.       Intensity THRR unchanged               Progression   Progression Continue to progress workloads to maintain intensity without signs/symptoms of physical distress.               Resistance Training   Training Prescription Yes       Weight 4 lbs       Reps 10-15       Time 10 Minutes               Treadmill   MPH 1.5       Grade 0       Minutes 17       METs 2.15               Recumbant Elliptical   Level 1       RPM 62       Minutes 22       METs 3.6              Exercise Comments:  Exercise Comments    Row Name 11/10/20 1200           Exercise Comments Patient completed first exercise session. He tolerated exercise well with no complaints. He is eager to reach his goals in the program. His vitals were normal with exercise. He is very nice and very eager to come back to rehab.              Exercise Goals and Review:  Exercise Goals    Row Name 11/05/20 1345 11/17/20 1314           Exercise Goals   Increase Physical Activity Yes Yes      Intervention Provide advice, education, support and counseling about physical activity/exercise needs.;Develop an individualized exercise prescription for aerobic and resistive training based on initial evaluation  findings, risk stratification, comorbidities and participant's personal goals. Provide advice, education, support and counseling about physical activity/exercise needs.;Develop an individualized exercise prescription for aerobic and resistive training based on initial evaluation findings, risk stratification, comorbidities and participant's personal goals.      Expected Outcomes Short Term: Attend rehab on a regular basis to increase amount of physical activity.;Long Term: Add in home exercise to make exercise part of routine and to increase amount of physical activity.;Long Term: Exercising regularly at least 3-5 days a week. Short Term: Attend rehab on a regular basis to increase amount of physical activity.;Long Term: Add in home exercise to make exercise part of routine and to increase amount of physical activity.;Long Term: Exercising regularly at least 3-5 days a week.      Increase Strength and Stamina Yes Yes      Intervention Provide advice, education, support and counseling  about physical activity/exercise needs.;Develop an individualized exercise prescription for aerobic and resistive training based on initial evaluation findings, risk stratification, comorbidities and participant's personal goals. Provide advice, education, support and counseling about physical activity/exercise needs.;Develop an individualized exercise prescription for aerobic and resistive training based on initial evaluation findings, risk stratification, comorbidities and participant's personal goals.      Expected Outcomes Short Term: Increase workloads from initial exercise prescription for resistance, speed, and METs.;Short Term: Perform resistance training exercises routinely during rehab and add in resistance training at home;Long Term: Improve cardiorespiratory fitness, muscular endurance and strength as measured by increased METs and functional capacity (6MWT) Short Term: Increase workloads from initial exercise  prescription for resistance, speed, and METs.;Short Term: Perform resistance training exercises routinely during rehab and add in resistance training at home;Long Term: Improve cardiorespiratory fitness, muscular endurance and strength as measured by increased METs and functional capacity (6MWT)      Able to understand and use rate of perceived exertion (RPE) scale Yes Yes      Intervention Provide education and explanation on how to use RPE scale Provide education and explanation on how to use RPE scale      Expected Outcomes Short Term: Able to use RPE daily in rehab to express subjective intensity level;Long Term:  Able to use RPE to guide intensity level when exercising independently Short Term: Able to use RPE daily in rehab to express subjective intensity level;Long Term:  Able to use RPE to guide intensity level when exercising independently      Able to understand and use Dyspnea scale Yes Yes      Intervention Provide education and explanation on how to use Dyspnea scale Provide education and explanation on how to use Dyspnea scale      Expected Outcomes Short Term: Able to use Dyspnea scale daily in rehab to express subjective sense of shortness of breath during exertion;Long Term: Able to use Dyspnea scale to guide intensity level when exercising independently Short Term: Able to use Dyspnea scale daily in rehab to express subjective sense of shortness of breath during exertion;Long Term: Able to use Dyspnea scale to guide intensity level when exercising independently      Knowledge and understanding of Target Heart Rate Range (THRR) Yes Yes      Intervention Provide education and explanation of THRR including how the numbers were predicted and where they are located for reference Provide education and explanation of THRR including how the numbers were predicted and where they are located for reference      Expected Outcomes Short Term: Able to state/look up THRR;Long Term: Able to use THRR to  govern intensity when exercising independently;Short Term: Able to use daily as guideline for intensity in rehab Short Term: Able to state/look up THRR;Long Term: Able to use THRR to govern intensity when exercising independently;Short Term: Able to use daily as guideline for intensity in rehab      Understanding of Exercise Prescription Yes Yes      Intervention Provide education, explanation, and written materials on patient's individual exercise prescription Provide education, explanation, and written materials on patient's individual exercise prescription      Expected Outcomes Short Term: Able to explain program exercise prescription;Long Term: Able to explain home exercise prescription to exercise independently Short Term: Able to explain program exercise prescription;Long Term: Able to explain home exercise prescription to exercise independently             Exercise Goals Re-Evaluation :  Exercise Goals Re-Evaluation  [place] Name 11/17/20 1310             Exercise Goal Re-Evaluation   Exercise Goals Review Increase Physical Activity;Increase Strength and Stamina;Able to understand and use rate of perceived exertion (RPE) scale;Able to understand and use Dyspnea scale;Knowledge and understanding of Target Heart Rate Range (THRR);Able to check pulse independently;Understanding of Exercise Prescription       Comments patient has completed 2 exercise sessions. He has tolerated exercise well with no complaints. He ia very eager to get healthier and decrease his shortness of breath. 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 He is excited to come back to rehab. He is currenlty exercising at 3.6 METs on the elliptical. Will continue to monitor and progress as able.       Expected Outcomes Through exercise at rehab and at home, patient will achieve their goals.              Discharge Exercise Prescription (Final Exercise Prescription Changes):  Exercise  Prescription Changes - 11/17/20 1300      Response to Exercise   Blood Pressure (Admit) 120/68    Blood Pressure (Exercise) 146/72    Blood Pressure (Exit) 118/72    Heart Rate (Admit) 81 bpm    Heart Rate (Exercise) 99 bpm    Heart Rate (Exit) 84 bpm    Oxygen Saturation (Admit) 96 %    Oxygen Saturation (Exercise) 96 %    Oxygen Saturation (Exit) 98 %    Rating of Perceived Exertion (Exercise) 12    Perceived Dyspnea (Exercise) 12    Duration Continue with 30 min of aerobic exercise without signs/symptoms of physical distress.    Intensity THRR unchanged      Progression   Progression Continue to progress workloads to maintain intensity without signs/symptoms of physical distress.      Resistance Training   Training Prescription Yes    Weight 4 lbs    Reps 10-15    Time 10 Minutes      Treadmill   MPH 1.5    Grade 0    Minutes 17    METs 2.15      Recumbant Elliptical   Level 1    RPM 62    Minutes 22    METs 3.6           Nutrition:  Target Goals: Understanding of nutrition guidelines, daily intake of sodium 1500mg , cholesterol 200mg , calories 30% from fat and 7% or less from saturated fats, daily to have 5 or more servings of fruits and vegetables.  Biometrics:  Pre Biometrics - 11/17/20 1312      Pre Biometrics   Weight 90.9 kg    BMI (Calculated) 25.05            Nutrition Therapy Plan and Nutrition Goals:  Nutrition Therapy & Goals - 11/09/20 1232      Personal Nutrition Goals   Comments We provide 2 educational sessions on heart healthy nutrition with handouts and offer assistance with RD referral if patient is interested.      Intervention Plan   Intervention Nutrition handout(s) given to patient.           Nutrition Assessments:  MEDIFICTS Score Key:  ?70 Need to make dietary changes   40-70 Heart Healthy Diet  ? 40 Therapeutic Level Cholesterol Diet   Picture Your Plate Scores:  <89 Unhealthy dietary pattern with much  room for improvement.  41-50 Dietary  pattern unlikely to meet recommendations for good health and room for improvement.  51-60 More healthful dietary pattern, with some room for improvement.   >60 Healthy dietary pattern, although there may be some specific behaviors that could be improved.    Nutrition Goals Re-Evaluation:   Nutrition Goals Discharge (Final Nutrition Goals Re-Evaluation):   Psychosocial: Target Goals: Acknowledge presence or absence of significant depression and/or stress, maximize coping skills, provide positive support system. Participant is able to verbalize types and ability to use techniques and skills needed for reducing stress and depression.  Initial Review & Psychosocial Screening:  Initial Psych Review & Screening - 11/05/20 1239      Initial Review   Current issues with Current Sleep Concerns   Has trouble sleeping due to being on prednisone     Family Dynamics   Good Support System? Yes    Comments His wife is his main support system. He also has a mother, daughter, and son who support him.      Barriers   Psychosocial barriers to participate in program There are no identifiable barriers or psychosocial needs.      Screening Interventions   Interventions Encouraged to exercise    Expected Outcomes Long Term goal: The participant improves quality of Life and PHQ9 Scores as seen by post scores and/or verbalization of changes;Short Term goal: Identification and review with participant of any Quality of Life or Depression concerns found by scoring the questionnaire.           Quality of Life Scores:  Quality of Life - 11/05/20 1346      Quality of Life   Select Quality of Life      Quality of Life Scores   Health/Function Pre 16.88 %    Socioeconomic Pre 23.71 %    Psych/Spiritual Pre 27.43 %    Family Pre 27.6 %    GLOBAL Pre 21.89 %          Scores of 19 and below usually indicate a poorer quality of life in these areas.  A difference  of  2-3 points is a clinically meaningful difference.  A difference of 2-3 points in the total score of the Quality of Life Index has been associated with significant improvement in overall quality of life, self-image, physical symptoms, and general health in studies assessing change in quality of life.   PHQ-9: Recent Review Flowsheet Data    Depression screen [facility] 2/9 11/05/2020 09/25/2018 07/02/2018 07/02/2018   Decreased Interest 2 0 0 0   Down, Depressed, Hopeless 1 0 0 0   PHQ - 2 Score 3 0 0 0   Altered sleeping 3 - - -   Tired, decreased energy 3 - - -   Change in appetite 1 - - -   Feeling bad or failure about yourself  0 - - -   Trouble concentrating 0 - - -   Moving slowly or fidgety/restless 0 - - -   Suicidal thoughts 0 - - -   PHQ-9 Score 10 - - -   Difficult doing work/chores Very difficult - - -     Interpretation of Total Score  Total Score Depression Severity:  1-4 = Minimal depression, 5-9 = Mild depression, 10-14 = Moderate depression, 15-19 = Moderately severe depression, 20-27 = Severe depression   Psychosocial Evaluation and Intervention:  Psychosocial Evaluation - 11/05/20 1347      Psychosocial Evaluation & Interventions   Interventions Encouraged to exercise with the program and  follow exercise prescription    Comments Pt has no barriers to completing rehab. Pt has no identifiable psychosocial issues. He has been having some issues sleeping which he attributes to being on prednisone since February. He is beginning to taper his dosage down, so he is hopeful that this will improve his sleep. His PHQ-9 score was a 10. 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 He was previously very active, but now he is unable to do much without becoming fatigued. He copes well. His support system is strong with his wife, children, and mother. He is hopeful that the pulmonary rehab program will help him get back to his previous physical functioning. His goal is to  decrease his SOB and to gain his stamina back. He would like to go back to playing golf and would like to be strong enough to return to work in August. He is excited to start the program.    Expected Outcomes The patient will continue to not have any identifiable psychosocial issues.    Continue Psychosocial Services  No Follow up required           Psychosocial Re-Evaluation:  Psychosocial Re-Evaluation    [place] Name 11/09/20 1232             Psychosocial Re-Evaluation   Current issues with Current Stress Concerns       Comments Patient is new to the program. He plans to start this week. His insomina is managed with Ambien with no other psychosocial issues identified. We will continue to monitor.       Expected Outcomes Patient's sleep will continue to be managed with no additional psychosocial issues identified.       Interventions Stress management education;Encouraged to attend Pulmonary Rehabilitation for the exercise;Relaxation education       Continue Psychosocial Services  No Follow up required              Psychosocial Discharge (Final Psychosocial Re-Evaluation):  Psychosocial Re-Evaluation - 11/09/20 1232      Psychosocial Re-Evaluation   Current issues with Current Stress Concerns    Comments Patient is new to the program. He plans to start this week. His insomina is managed with Ambien with no other psychosocial issues identified. We will continue to monitor.    Expected Outcomes Patient's sleep will continue to be managed with no additional psychosocial issues identified.    Interventions Stress management education;Encouraged to attend Pulmonary Rehabilitation for the exercise;Relaxation education    Continue Psychosocial Services  No Follow up required            Education: Education Goals: Education classes will be provided on a weekly basis, covering required topics. Participant will state understanding/return demonstration of topics presented.  Learning  Barriers/Preferences:  Learning Barriers/Preferences - 11/05/20 1245      Learning Barriers/Preferences   Learning Barriers None    Learning Preferences Skilled Demonstration           Education Topics: How Lungs Work and Diseases: - Discuss the anatomy of the lungs and diseases that can affect the lungs, such as COPD.   Exercise: -Discuss the importance of exercise, FITT principles of exercise, normal and abnormal responses to exercise, and how to exercise safely.   Environmental Irritants: -Discuss types of environmental irritants and how to limit exposure to environmental irritants.   Meds/Inhalers and oxygen: - Discuss respiratory medications, definition of an inhaler and oxygen, and the proper way to use an inhaler and  oxygen.   Energy Saving Techniques: - Discuss methods to conserve energy and decrease shortness of breath when performing activities of daily living.    Bronchial Hygiene / Breathing Techniques: - Discuss breathing mechanics, pursed-lip breathing technique,  proper posture, effective ways to clear airways, and other functional breathing techniques   Cleaning Equipment: - Provides group verbal and written instruction about the health risks of elevated stress, cause of high stress, and healthy ways to reduce stress.   Nutrition I: Fats: - Discuss the types of cholesterol, what cholesterol does to the body, and how cholesterol levels can be controlled.   Nutrition II: Labels: -Discuss the different components of food labels and how to read food labels.   Respiratory Infections: - Discuss the signs and symptoms of respiratory infections, ways to prevent respiratory infections, and the importance of seeking medical treatment when having a respiratory infection.   Stress I: Signs and Symptoms: - Discuss the causes of stress, how stress may lead to anxiety and depression, and ways to limit stress.   Stress II: Relaxation: -Discuss relaxation  techniques to limit stress.   Oxygen for Home/Travel: - Discuss how to prepare for travel when on oxygen and proper ways to transport and store oxygen to ensure safety.   Knowledge Questionnaire Score:  Knowledge Questionnaire Score - 11/05/20 1246      Knowledge Questionnaire Score   Pre Score 16/18           Core Components/Risk Factors/Patient Goals at Admission:  Personal Goals and Risk Factors at Admission - 11/05/20 1246      Core Components/Risk Factors/Patient Goals on Admission    Weight Management Yes;Weight Maintenance;Weight Loss    Intervention Weight Management: Develop a combined nutrition and exercise program designed to reach desired caloric intake, while maintaining appropriate intake of nutrient and fiber, sodium and fats, and appropriate energy expenditure required for the weight goal.;Weight Management: Provide education and appropriate resources to help participant work on and attain dietary goals.;Weight Management/Obesity: Establish reasonable short term and long term weight goals.    Expected Outcomes Short Term: Continue to assess and modify interventions until short term weight is achieved;Long Term: Adherence to nutrition and physical activity/exercise program aimed toward attainment of established weight goal;Weight Maintenance: Understanding of the daily nutrition guidelines, which includes 25-35% calories from fat, 7% or less cal from saturated fats, less than 200mg  cholesterol, less than 1.5gm of sodium, & 5 or more servings of fruits and vegetables daily;Weight Loss: Understanding of general recommendations for a balanced deficit meal plan, which promotes 1-2 lb weight loss per week and includes a negative energy balance of [phone] kcal/d;Understanding recommendations for meals to include 15-35% energy as protein, 25-35% energy from fat, 35-60% energy from carbohydrates, less than 200mg  of dietary cholesterol, 20-35 gm of total fiber daily;Understanding of  distribution of calorie intake throughout the day with the consumption of 4-5 meals/snacks    Improve shortness of breath with ADL's Yes    Intervention Provide education, individualized exercise plan and daily activity instruction to help decrease symptoms of SOB with activities of daily living.    Expected Outcomes Short Term: Improve cardiorespiratory fitness to achieve a reduction of symptoms when performing ADLs;Long Term: Be able to perform more ADLs without symptoms or delay the onset of symptoms           Core Components/Risk Factors/Patient Goals Review:   Goals and Risk Factor Review    Row Name 11/09/20 1234  Core Components/Risk Factors/Patient Goals Review   Personal Goals Review Other       Review Patient was referred to [facility] with DOE post COVID syndrome. He plans to start the program 11/10/20. His personal goals for the program are to decrease his SOB; increase his stamina; play golf again and be able to return to work in August. We will continue to monitor his progress as he works toward meeting these goals.       Expected Outcomes Patient will complete the program meeting both program and personal goals.              Core Components/Risk Factors/Patient Goals at Discharge (Final Review):   Goals and Risk Factor Review - 11/09/20 1234      Core Components/Risk Factors/Patient Goals Review   Personal Goals Review Other    Review Patient was referred to PR with DOE post COVID syndrome. He plans to start the program 11/10/20. His personal goals for the program are to decrease his SOB; increase his stamina; play golf again and be able to return to work in August. We will continue to monitor his progress as he works toward meeting these goals.    Expected Outcomes Patient will complete the program meeting both program and personal goals.           ITP Comments:   Comments: ITP REVIEW Pt is making expected progress toward pulmonary rehab goals after  completing 3 sessions. Recommend continued exercise, life style modification, education, and utilization of breathing techniques to increase stamina and strength and decrease shortness of breath with exertion.

## 2020-11-24 ENCOUNTER — Encounter (HOSPITAL_COMMUNITY): Payer: 59

## 2020-11-26 ENCOUNTER — Encounter (HOSPITAL_COMMUNITY): Payer: 59

## 2020-12-01 ENCOUNTER — Other Ambulatory Visit: Payer: Self-pay

## 2020-12-01 ENCOUNTER — Encounter (HOSPITAL_COMMUNITY)
Admission: RE | Admit: 2020-12-01 | Discharge: 2020-12-01 | Disposition: A | Discharge status: Home / Self Care | Payer: 59 | Source: Ambulatory Visit | Attending: Pulmonary Disease | Admitting: Pulmonary Disease

## 2020-12-01 DIAGNOSIS — R0600 Dyspnea, unspecified: Secondary | ICD-10-CM | POA: Diagnosis not present

## 2020-12-01 DIAGNOSIS — R0609 Other forms of dyspnea: Secondary | ICD-10-CM

## 2020-12-01 DIAGNOSIS — U099 Post covid-19 condition, unspecified: Secondary | ICD-10-CM

## 2020-12-01 NOTE — Progress Notes (Signed)
Daily Session Note  Patient Details  Name: Marvin Munoz MRN: [id number] Date of Birth: 03/28/1961 Referring Provider:   Flowsheet Row PULMONARY REHAB OTHER RESP ORIENTATION from 11/05/2020 in [place]  Referring Provider Dr. Vaughan Browner       Encounter Date: 12/01/2020  Check In:  Session Check In - 12/01/20 1045       Check-In   Supervising physician immediately available to respond to emergencies [facility] MD immediately available    Physician(s) Dr. Harl Bowie    Location [facility]    Staff Present Hoy Register, MS, ACSM-CEP, Exercise Physiologist    Virtual Visit No    Medication changes reported     No    Fall or balance concerns reported    No    Tobacco Cessation No Change    Warm-up and Cool-down Performed as group-led instruction    Resistance Training Performed Yes    VAD Patient? No    PAD/SET Patient? No      Pain Assessment   Currently in Pain? No/denies    Multiple Pain Sites No             Capillary Blood Glucose: No results found for this or any previous visit (from the past 24 hour(s)).    Social History   Tobacco Use  Smoking Status Never  Smokeless Tobacco Never    Goals Met:  Independence with exercise equipment Exercise tolerated well No report of cardiac concerns or symptoms Strength training completed today  Goals Unmet:  Not Applicable  Comments: checkout time is 1145   Dr. Kathie Dike is Medical Director for [facility].

## 2020-12-03 ENCOUNTER — Other Ambulatory Visit: Payer: Self-pay

## 2020-12-03 ENCOUNTER — Encounter (HOSPITAL_COMMUNITY)
Admission: RE | Admit: 2020-12-03 | Discharge: 2020-12-03 | Disposition: A | Discharge status: Home / Self Care | Payer: 59 | Source: Ambulatory Visit | Attending: Pulmonary Disease | Admitting: Pulmonary Disease

## 2020-12-03 DIAGNOSIS — U099 Post covid-19 condition, unspecified: Secondary | ICD-10-CM

## 2020-12-03 DIAGNOSIS — R0600 Dyspnea, unspecified: Secondary | ICD-10-CM | POA: Diagnosis not present

## 2020-12-03 DIAGNOSIS — R0609 Other forms of dyspnea: Secondary | ICD-10-CM

## 2020-12-03 NOTE — Progress Notes (Signed)
Daily Session Note  Patient Details  Name: ERDEM NAAS MRN: [id number] Date of Birth: 12-18-1960 Referring Provider:   [place] from 11/05/2020 in [place]  Referring Provider Dr. Vaughan Browner       Encounter Date: 12/03/2020  Check In:  Session Check In - 12/03/20 1044       Check-In   Supervising physician immediately available to respond to emergencies [facility] MD immediately available    Physician(s) Dr. Harrington Challenger    Location [facility]    Staff Present Aundra Dubin, RN, Bjorn Loser, MS, ACSM-CEP, Exercise Physiologist    Virtual Visit No    Medication changes reported     No    Fall or balance concerns reported    No    Tobacco Cessation No Change    Warm-up and Cool-down Performed as group-led instruction    Resistance Training Performed Yes    VAD Patient? No    PAD/SET Patient? No      Pain Assessment   Currently in Pain? No/denies    Multiple Pain Sites No             Capillary Blood Glucose: No results found for this or any previous visit (from the past 24 hour(s)).    Social History   Tobacco Use  Smoking Status Never  Smokeless Tobacco Never    Goals Met:  Proper associated with RPD/PD & O2 Sat Independence with exercise equipment Using PLB without cueing & demonstrates good technique Exercise tolerated well No report of cardiac concerns or symptoms Strength training completed today  Goals Unmet:  Not Applicable  Comments: Check out 1145.   Dr. Kathie Dike is Medical Director for [facility].

## 2020-12-08 ENCOUNTER — Encounter (HOSPITAL_COMMUNITY)
Admission: RE | Admit: 2020-12-08 | Discharge: 2020-12-08 | Disposition: A | Discharge status: Home / Self Care | Payer: 59 | Source: Ambulatory Visit | Attending: Pulmonary Disease | Admitting: Pulmonary Disease

## 2020-12-08 ENCOUNTER — Other Ambulatory Visit: Payer: Self-pay

## 2020-12-08 VITALS — Wt 202.2 lb

## 2020-12-08 DIAGNOSIS — R0609 Other forms of dyspnea: Secondary | ICD-10-CM

## 2020-12-08 DIAGNOSIS — R0600 Dyspnea, unspecified: Secondary | ICD-10-CM

## 2020-12-08 DIAGNOSIS — U099 Post covid-19 condition, unspecified: Secondary | ICD-10-CM

## 2020-12-08 NOTE — Progress Notes (Signed)
Daily Session Note  Patient Details  Name: Marvin Munoz MRN: [id number] Date of Birth: 07-Mar-1961 Referring Provider:   Flowsheet Row PULMONARY REHAB OTHER RESP ORIENTATION from 11/05/2020 in [place]  Referring Provider Dr. Vaughan Browner       Encounter Date: 12/08/2020  Check In:  Session Check In - 12/08/20 1045       Check-In   Supervising physician immediately available to respond to emergencies [facility] MD immediately available    Physician(s) Dr. Harl Bowie    Location [facility]    Staff Present Hoy Register, MS, ACSM-CEP, Exercise Physiologist    Virtual Visit No    Medication changes reported     No    Fall or balance concerns reported    No    Tobacco Cessation No Change    Warm-up and Cool-down Performed as group-led instruction    Resistance Training Performed Yes    VAD Patient? No    PAD/SET Patient? No      Pain Assessment   Currently in Pain? No/denies    Multiple Pain Sites No             Capillary Blood Glucose: No results found for this or any previous visit (from the past 24 hour(s)).    Social History   Tobacco Use  Smoking Status Never  Smokeless Tobacco Never    Goals Met:  Independence with exercise equipment Exercise tolerated well No report of cardiac concerns or symptoms Strength training completed today  Goals Unmet:  Not Applicable  Comments: checkout time is 1145   Dr. Kathie Dike is Medical Director for [facility].

## 2020-12-09 NOTE — Progress Notes (Signed)
Pulmonary Individual Treatment Plan  Patient Details  Name: Marvin Munoz MRN: [id number] Date of Birth: [birth year] Referring Provider:   Flowsheet Row PULMONARY REHAB OTHER RESP ORIENTATION from 11/05/2020 in [place]  Referring Provider Dr. Vaughan Browner       Initial Encounter Date:  Flowsheet Row PULMONARY REHAB OTHER RESP ORIENTATION from 11/05/2020 in [facility]  Date 11/05/20       Visit Diagnosis: Dyspnea on exertion  Post-COVID-19 syndrome  Patient's Home Medications on Admission:   Current Outpatient Medications:    allopurinol (ZYLOPRIM) 300 MG tablet, Take 150 mg by mouth every evening. , Disp: , Rfl:    aspirin EC 81 MG tablet, Take 81 mg by mouth daily., Disp: , Rfl:    baclofen (LIORESAL) 10 MG tablet, Take 1 tablet (10 mg total) by mouth 3 (three) times daily. As needed for muscle spasm, Disp: 50 tablet, Rfl: 0   Calcium Carb-Cholecalciferol (CALCIUM + D3 PO), Take 1 tablet by mouth daily. , Disp: , Rfl:    cetirizine (ZYRTEC) 10 MG tablet, Take 10 mg by mouth every morning. , Disp: , Rfl:    diltiazem (CARDIZEM CD) 240 MG 24 hr capsule, Take 240 mg by mouth every evening. , Disp: , Rfl:    FIBER PO, Take 1 capsule by mouth daily., Disp: , Rfl:    fluticasone furoate-vilanterol (BREO ELLIPTA) 200-25 MCG/INH AEPB, Inhale 1 puff into the lungs daily., Disp: 60 each, Rfl: 6   Multiple Vitamin (MULTIVITAMIN) tablet, Take 1 tablet by mouth daily., Disp: , Rfl:    omeprazole (PRILOSEC) 40 MG capsule, Take 40 mg by mouth every morning. , Disp: , Rfl:    predniSONE (DELTASONE) 10 MG tablet, Take 4 tablets (40 mg total) by mouth daily with breakfast., Disp: 120 tablet, Rfl: 2   rizatriptan (MAXALT-MLT) 10 MG disintegrating tablet, Take 10 mg by mouth as needed for migraine. May repeat in 2 hours if needed, Disp: , Rfl:    rOPINIRole (REQUIP) 1 MG tablet, Take 2 mg by mouth daily., Disp: , Rfl:    rosuvastatin (CRESTOR) 5 MG tablet,  TAKE (1) TABLET BY MOUTH ONCE DAILY., Disp: 30 tablet, Rfl: 10   sulfamethoxazole-trimethoprim (BACTRIM DS) 800-160 MG tablet, Take 1 tablet by mouth 3 (three) times a week., Disp: 12 tablet, Rfl: 2   valACYclovir (VALTREX) 1000 MG tablet, Take 1,000 mg by mouth daily as needed (for cold sores). , Disp: , Rfl:    zolpidem (AMBIEN) 10 MG tablet, Take 10 mg by mouth as needed., Disp: , Rfl:   Past Medical History: Past Medical History:  Diagnosis Date   Allergic rhinitis    Eczema    Fever blister    occasional   Foot drop, right 09/24/2014   Gastroesophageal reflux    Gout    05-09-2018 per pt last flare up, 2014   Guillain Barr syndrome ([place]) 1970's   per pt received swine flu vaccination and regular flu vaccination at same time, was told one or the other may have caused guillain barre   HTN (hypertension)    Migraine headache    Mild aortic valve stenosis cardiologist-  dr Daneen Schick   per last echo  02-01-2017   moderately thickended and calcificed leaflets with mild AS and mild AR,  valve area 2.43cm^2   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 Tamala Julian--- per last CTA 02-21-2018 , 67mm    Tobacco Use: Social History   Tobacco Use  Smoking Status Never  Smokeless Tobacco Never    Labs: Recent Review Flowsheet Data     Labs for ITP Cardiac and Pulmonary Rehab Latest Ref Rng & Units 06/18/2019   Cholestrol 100 - 199 mg/dL 153   LDLCALC 0 - 99 mg/dL 85   HDL >39 mg/dL 48   Trlycerides 0 - 149 mg/dL 108       Capillary Blood Glucose: No results found for: GLUCAP   Pulmonary  Assessment Scores:  Pulmonary Assessment Scores     Row Name 11/05/20 1247         ADL UCSD   SOB Score total 64           CAT Score     CAT Score 18           mMRC Score     mMRC Score 1            UCSD: Self-administered rating of dyspnea associated with activities of daily living (ADLs) 6-point scale (0 = "not at all" to 5 = "maximal or unable to do because of breathlessness")  Scoring Scores range from 0 to 120.  Minimally important difference is 5 units  CAT: CAT can identify the health impairment of COPD patients and is better correlated with disease progression.  CAT has a scoring range of zero to 40. The CAT score is classified into four groups of low (less than 10), medium (10 - 20), high (21-30) and very high (31-40) based on the impact level of disease on health status. A CAT score over 10 suggests significant symptoms.  A worsening CAT score could be explained by an exacerbation, poor medication adherence, poor inhaler technique, or progression of COPD or comorbid conditions.  CAT MCID is 2 points  mMRC: mMRC (Modified Medical Research Council) Dyspnea Scale is used to assess the degree of baseline functional disability in patients of respiratory disease due to dyspnea. No minimal important difference is established. A decrease in score of 1 point or greater is considered a positive change.   Pulmonary Function Assessment:   Exercise Target Goals: Exercise Program Goal: Individual exercise prescription set using results from initial 6 min walk test and THRR while considering  patient's activity barriers and safety.   Exercise Prescription Goal: Initial exercise prescription builds to 30-45 minutes a day of aerobic activity, 2-3 days per week.  Home exercise guidelines will be given to patient during program as part of exercise prescription that the participant will acknowledge.  Activity Barriers & Risk Stratification:  Activity Barriers & Cardiac Risk  Stratification - 11/05/20 1237       Activity Barriers & Cardiac Risk Stratification   Activity Barriers Arthritis;Back Problems;Left Knee Replacement;Deconditioning;Shortness of Breath    Cardiac Risk Stratification Moderate             6 Minute Walk:  6 Minute Walk     Row Name 11/05/20 1342         6 Minute Walk   Phase Initial     Distance 1550 feet  Walk Time 6 minutes     # of Rest Breaks 0     MPH 2.94     METS 4.2     RPE 12     Perceived Dyspnea  13     VO2 Peak 14.69     Symptoms No     Resting HR 83 bpm     Resting BP 110/70     Resting Oxygen Saturation  97 %     Exercise Oxygen Saturation  during 6 min walk 94 %     Max Ex. HR 95 bpm     Max Ex. BP 134/68     2 Minute Post BP 104/70           Interval HR     1 Minute HR 92     2 Minute HR 92     3 Minute HR 88     4 Minute HR 92     5 Minute HR 92     6 Minute HR 95     2 Minute Post HR 81     Interval Heart Rate? Yes           Interval Oxygen     Interval Oxygen? Yes     Baseline Oxygen Saturation % 97 %     1 Minute Oxygen Saturation % 95 %     1 Minute Liters of Oxygen 0 L     2 Minute Oxygen Saturation % 96 %     2 Minute Liters of Oxygen 0 L     3 Minute Oxygen Saturation % 94 %     3 Minute Liters of Oxygen 0 L     4 Minute Oxygen Saturation % 96 %     4 Minute Liters of Oxygen 0 L     5 Minute Oxygen Saturation % 96 %     5 Minute Liters of Oxygen 0 L     6 Minute Oxygen Saturation % 98 %     6 Minute Liters of Oxygen 0 L     2 Minute Post Oxygen Saturation % 98 %     2 Minute Post Liters of Oxygen 0 L             Oxygen Initial Assessment:  Oxygen Initial Assessment - 11/05/20 1342       Initial 6 min Walk   Oxygen Used None      Program Oxygen Prescription   Program Oxygen Prescription None      Intervention   Short Term Goals To learn and exhibit compliance with exercise, home and travel O2 prescription;To learn and understand importance of monitoring SPO2  with pulse oximeter and demonstrate accurate use of the pulse oximeter.;To learn and understand importance of maintaining oxygen saturations>88%;To learn and demonstrate proper pursed lip breathing techniques or other breathing techniques. ;To learn and demonstrate proper use of respiratory medications    Long  Term Goals Exhibits compliance with exercise, home  and travel O2 prescription;Verbalizes importance of monitoring SPO2 with pulse oximeter and return demonstration;Maintenance of O2 saturations>88%;Exhibits proper breathing techniques, such as pursed lip breathing or other method taught during program session;Compliance with respiratory medication             Oxygen Re-Evaluation:  Oxygen Re-Evaluation     [place] Name 11/17/20 1314 12/08/20 1447           Program Oxygen Prescription   Program Oxygen Prescription None None  Home Oxygen      Home Oxygen Device None None      Sleep Oxygen Prescription None None      Home Exercise Oxygen Prescription None None      Home Resting Oxygen Prescription None None      Compliance with Home Oxygen Use Yes Yes             Goals/Expected Outcomes      Short Term Goals To learn and exhibit compliance with exercise, home and travel O2 prescription;To learn and understand importance of monitoring SPO2 with pulse oximeter and demonstrate accurate use of the pulse oximeter.;To learn and understand importance of maintaining oxygen saturations>88%;To learn and demonstrate proper pursed lip breathing techniques or other breathing techniques. ;To learn and demonstrate proper use of respiratory medications To learn and exhibit compliance with exercise, home and travel O2 prescription;To learn and understand importance of monitoring SPO2 with pulse oximeter and demonstrate accurate use of the pulse oximeter.;To learn and understand importance of maintaining oxygen saturations>88%;To learn and demonstrate proper pursed lip breathing techniques or  other breathing techniques. ;To learn and demonstrate proper use of respiratory medications      Long  Term Goals Exhibits compliance with exercise, home  and travel O2 prescription;Verbalizes importance of monitoring SPO2 with pulse oximeter and return demonstration;Maintenance of O2 saturations>88%;Exhibits proper breathing techniques, such as pursed lip breathing or other method taught during program session;Compliance with respiratory medication Exhibits compliance with exercise, home  and travel O2 prescription;Verbalizes importance of monitoring SPO2 with pulse oximeter and return demonstration;Maintenance of O2 saturations>88%;Exhibits proper breathing techniques, such as pursed lip breathing or other method taught during program session;Compliance with respiratory medication      Goals/Expected Outcomes -- compliance              Oxygen Discharge (Final Oxygen Re-Evaluation):  Oxygen Re-Evaluation - 12/08/20 1447       Program Oxygen Prescription   Program Oxygen Prescription None      Home Oxygen   Home Oxygen Device None    Sleep Oxygen Prescription None    Home Exercise Oxygen Prescription None    Home Resting Oxygen Prescription None    Compliance with Home Oxygen Use Yes      Goals/Expected Outcomes   Short Term Goals To learn and exhibit compliance with exercise, home and travel O2 prescription;To learn and understand importance of monitoring SPO2 with pulse oximeter and demonstrate accurate use of the pulse oximeter.;To learn and understand importance of maintaining oxygen saturations>88%;To learn and demonstrate proper pursed lip breathing techniques or other breathing techniques. ;To learn and demonstrate proper use of respiratory medications    Long  Term Goals Exhibits compliance with exercise, home  and travel O2 prescription;Verbalizes importance of monitoring SPO2 with pulse oximeter and return demonstration;Maintenance of O2 saturations>88%;Exhibits proper breathing  techniques, such as pursed lip breathing or other method taught during program session;Compliance with respiratory medication    Goals/Expected Outcomes compliance             Initial Exercise Prescription:  Initial Exercise Prescription - 11/05/20 1300       Date of Initial Exercise RX and Referring Provider   Date 11/05/20    Referring Provider Dr. Vaughan Browner    Expected Discharge Date 03/11/21      Treadmill   MPH 1.5    Grade 0    Minutes 17      Recumbant Elliptical   Level 1    RPM 60  Minutes 22      Prescription Details   Frequency (times per week) 2    Duration Progress to 30 minutes of continuous aerobic without signs/symptoms of physical distress      Intensity   THRR 40-80% of Max Heartrate 64-128    Ratings of Perceived Exertion 11-13    Perceived Dyspnea 0-4      Resistance Training   Training Prescription Yes    Weight 4 lbs    Reps 10-15             Perform Capillary Blood Glucose checks as needed.  Exercise Prescription Changes:   Exercise Prescription Changes     Row Name 11/17/20 1300 12/08/20 1400           Response to Exercise   Blood Pressure (Admit) 120/68 96/52      Blood Pressure (Exercise) 146/72 106/70      Blood Pressure (Exit) 118/72 106/72      Heart Rate (Admit) 81 bpm 75 bpm      Heart Rate (Exercise) 99 bpm 96 bpm      Heart Rate (Exit) 84 bpm 80 bpm      Oxygen Saturation (Admit) 96 % 97 %      Oxygen Saturation (Exercise) 96 % 97 %      Oxygen Saturation (Exit) 98 % 97 %      Rating of Perceived Exertion (Exercise) 12 12      Perceived Dyspnea (Exercise) 12 12      Duration Continue with 30 min of aerobic exercise without signs/symptoms of physical distress. Continue with 30 min of aerobic exercise without signs/symptoms of physical distress.      Intensity THRR unchanged THRR unchanged             Progression      Progression Continue to progress workloads to maintain intensity without signs/symptoms of  physical distress. Continue to progress workloads to maintain intensity without signs/symptoms of physical distress.             Resistance Training      Training Prescription Yes Yes      Weight 4 lbs 8 lbs      Reps 10-15 10-15      Time 10 Minutes 10 Minutes             Treadmill      MPH 1.5 1.7      Grade 0 0      Minutes 17 17      METs 2.15 2.6             Recumbant Elliptical      Level 1 3      RPM 62 74      Minutes 22 22      METs 3.6 4.5              Exercise Comments:   Exercise Comments     Row Name 11/10/20 1200           Exercise Comments Patient completed first exercise session. He tolerated exercise well with no complaints. He is eager to reach his goals in the program. His vitals were normal with exercise. He is very nice and very eager to come back to rehab.                Exercise Goals and Review:   Exercise Goals     Row Name 11/05/20 1345 11/17/20 1314 12/08/20 1447  Exercise Goals   Increase Physical Activity Yes Yes Yes     Intervention Provide advice, education, support and counseling about physical activity/exercise needs.;Develop an individualized exercise prescription for aerobic and resistive training based on initial evaluation findings, risk stratification, comorbidities and participant's personal goals. Provide advice, education, support and counseling about physical activity/exercise needs.;Develop an individualized exercise prescription for aerobic and resistive training based on initial evaluation findings, risk stratification, comorbidities and participant's personal goals. Provide advice, education, support and counseling about physical activity/exercise needs.;Develop an individualized exercise prescription for aerobic and resistive training based on initial evaluation findings, risk stratification, comorbidities and participant's personal goals.     Expected Outcomes Short Term: Attend rehab on a regular basis to  increase amount of physical activity.;Long Term: Add in home exercise to make exercise part of routine and to increase amount of physical activity.;Long Term: Exercising regularly at least 3-5 days a week. Short Term: Attend rehab on a regular basis to increase amount of physical activity.;Long Term: Add in home exercise to make exercise part of routine and to increase amount of physical activity.;Long Term: Exercising regularly at least 3-5 days a week. Short Term: Attend rehab on a regular basis to increase amount of physical activity.;Long Term: Add in home exercise to make exercise part of routine and to increase amount of physical activity.;Long Term: Exercising regularly at least 3-5 days a week.     Increase Strength and Stamina Yes Yes Yes     Intervention Provide advice, education, support and counseling about physical activity/exercise needs.;Develop an individualized exercise prescription for aerobic and resistive training based on initial evaluation findings, risk stratification, comorbidities and participant's personal goals. Provide advice, education, support and counseling about physical activity/exercise needs.;Develop an individualized exercise prescription for aerobic and resistive training based on initial evaluation findings, risk stratification, comorbidities and participant's personal goals. Provide advice, education, support and counseling about physical activity/exercise needs.;Develop an individualized exercise prescription for aerobic and resistive training based on initial evaluation findings, risk stratification, comorbidities and participant's personal goals.     Expected Outcomes Short Term: Increase workloads from initial exercise prescription for resistance, speed, and METs.;Short Term: Perform resistance training exercises routinely during rehab and add in resistance training at home;Long Term: Improve cardiorespiratory fitness, muscular endurance and strength as measured by  increased METs and functional capacity (6MWT) Short Term: Increase workloads from initial exercise prescription for resistance, speed, and METs.;Short Term: Perform resistance training exercises routinely during rehab and add in resistance training at home;Long Term: Improve cardiorespiratory fitness, muscular endurance and strength as measured by increased METs and functional capacity (6MWT) Short Term: Increase workloads from initial exercise prescription for resistance, speed, and METs.;Short Term: Perform resistance training exercises routinely during rehab and add in resistance training at home;Long Term: Improve cardiorespiratory fitness, muscular endurance and strength as measured by increased METs and functional capacity (6MWT)     Able to understand and use rate of perceived exertion (RPE) scale Yes Yes Yes     Intervention Provide education and explanation on how to use RPE scale Provide education and explanation on how to use RPE scale Provide education and explanation on how to use RPE scale     Expected Outcomes Short Term: Able to use RPE daily in rehab to express subjective intensity level;Long Term:  Able to use RPE to guide intensity level when exercising independently Short Term: Able to use RPE daily in rehab to express subjective intensity level;Long Term:  Able to use RPE to guide intensity level when  exercising independently Short Term: Able to use RPE daily in rehab to express subjective intensity level;Long Term:  Able to use RPE to guide intensity level when exercising independently     Able to understand and use Dyspnea scale Yes Yes Yes     Intervention Provide education and explanation on how to use Dyspnea scale Provide education and explanation on how to use Dyspnea scale Provide education and explanation on how to use Dyspnea scale     Expected Outcomes Short Term: Able to use Dyspnea scale daily in rehab to express subjective sense of shortness of breath during exertion;Long  Term: Able to use Dyspnea scale to guide intensity level when exercising independently Short Term: Able to use Dyspnea scale daily in rehab to express subjective sense of shortness of breath during exertion;Long Term: Able to use Dyspnea scale to guide intensity level when exercising independently Short Term: Able to use Dyspnea scale daily in rehab to express subjective sense of shortness of breath during exertion;Long Term: Able to use Dyspnea scale to guide intensity level when exercising independently     Knowledge and understanding of Target Heart Rate Range (THRR) Yes Yes Yes     Intervention Provide education and explanation of THRR including how the numbers were predicted and where they are located for reference Provide education and explanation of THRR including how the numbers were predicted and where they are located for reference Provide education and explanation of THRR including how the numbers were predicted and where they are located for reference     Expected Outcomes Short Term: Able to state/look up THRR;Long Term: Able to use THRR to govern intensity when exercising independently;Short Term: Able to use daily as guideline for intensity in rehab Short Term: Able to state/look up THRR;Long Term: Able to use THRR to govern intensity when exercising independently;Short Term: Able to use daily as guideline for intensity in rehab Short Term: Able to state/look up THRR;Long Term: Able to use THRR to govern intensity when exercising independently;Short Term: Able to use daily as guideline for intensity in rehab     Understanding of Exercise Prescription Yes Yes Yes     Intervention Provide education, explanation, and written materials on patient's individual exercise prescription Provide education, explanation, and written materials on patient's individual exercise prescription Provide education, explanation, and written materials on patient's individual exercise prescription     Expected Outcomes  Short Term: Able to explain program exercise prescription;Long Term: Able to explain home exercise prescription to exercise independently Short Term: Able to explain program exercise prescription;Long Term: Able to explain home exercise prescription to exercise independently Short Term: Able to explain program exercise prescription;Long Term: Able to explain home exercise prescription to exercise independently              Exercise Goals Re-Evaluation :  Exercise Goals Re-Evaluation     [place] Name 11/17/20 1310 12/08/20 1447           Exercise Goal Re-Evaluation   Exercise Goals Review Increase Physical Activity;Increase Strength and Stamina;Able to understand and use rate of perceived exertion (RPE) scale;Able to understand and use Dyspnea scale;Knowledge and understanding of Target Heart Rate Range (THRR);Able to check pulse independently;Understanding of Exercise Prescription Increase Physical Activity;Increase Strength and Stamina;Able to understand and use rate of perceived exertion (RPE) scale;Able to understand and use Dyspnea scale;Knowledge and understanding of Target Heart Rate Range (THRR);Able to check pulse independently;Understanding of Exercise Prescription      Comments patient has completed 2 exercise sessions.  He has tolerated exercise well with no complaints. He ia very eager to get healthier and decrease his shortness of breath. 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 He is excited to come back to rehab. He is currenlty exercising at 3.6 METs on the elliptical. Will continue to monitor and progress as able. Pt has completed 6 exercise sessions. He is progressing through the program and has been able to increase his workloads. He wants to be able to get back to his prev-COVID functioning. He continues to exercise at home as well. He is currently exercising at 4.5 METs on the stepper. Will continue to monitor and progress as able.      Expected  Outcomes Through exercise at rehab and at home, patient will achieve their goals. Through exercise at rehab and at home, patient will achieve their goals.               Discharge Exercise Prescription (Final Exercise Prescription Changes):  Exercise Prescription Changes - 12/08/20 1400       Response to Exercise   Blood Pressure (Admit) 96/52    Blood Pressure (Exercise) 106/70    Blood Pressure (Exit) 106/72    Heart Rate (Admit) 75 bpm    Heart Rate (Exercise) 96 bpm    Heart Rate (Exit) 80 bpm    Oxygen Saturation (Admit) 97 %    Oxygen Saturation (Exercise) 97 %    Oxygen Saturation (Exit) 97 %    Rating of Perceived Exertion (Exercise) 12    Perceived Dyspnea (Exercise) 12    Duration Continue with 30 min of aerobic exercise without signs/symptoms of physical distress.    Intensity THRR unchanged      Progression   Progression Continue to progress workloads to maintain intensity without signs/symptoms of physical distress.      Resistance Training   Training Prescription Yes    Weight 8 lbs    Reps 10-15    Time 10 Minutes      Treadmill   MPH 1.7    Grade 0    Minutes 17    METs 2.6      Recumbant Elliptical   Level 3    RPM 74    Minutes 22    METs 4.5             Nutrition:  Target Goals: Understanding of nutrition guidelines, daily intake of sodium 1500mg , cholesterol 200mg , calories 30% from fat and 7% or less from saturated fats, daily to have 5 or more servings of fruits and vegetables.  Biometrics:  Pre Biometrics - 11/17/20 1312       Pre Biometrics   Weight 90.9 kg    BMI (Calculated) 25.05              Nutrition Therapy Plan and Nutrition Goals:  Nutrition Therapy & Goals - 11/09/20 1232       Personal Nutrition Goals   Comments We provide 2 educational sessions on heart healthy nutrition with handouts and offer assistance with RD referral if patient is interested.      Intervention Plan   Intervention Nutrition  handout(s) given to patient.             Nutrition Assessments:  MEDIFICTS Score Key: ?70 Need to make dietary changes  40-70 Heart Healthy Diet ? 40 Therapeutic Level Cholesterol Diet   Picture Your Plate Scores: <52 Unhealthy dietary pattern with much room for improvement. 41-50 Dietary  pattern unlikely to meet recommendations for good health and room for improvement. 51-60 More healthful dietary pattern, with some room for improvement.  >60 Healthy dietary pattern, although there may be some specific behaviors that could be improved.    Nutrition Goals Re-Evaluation:   Nutrition Goals Discharge (Final Nutrition Goals Re-Evaluation):   Psychosocial: Target Goals: Acknowledge presence or absence of significant depression and/or stress, maximize coping skills, provide positive support system. Participant is able to verbalize types and ability to use techniques and skills needed for reducing stress and depression.  Initial Review & Psychosocial Screening:  Initial Psych Review & Screening - 11/05/20 1239       Initial Review   Current issues with Current Sleep Concerns   Has trouble sleeping due to being on prednisone     Family Dynamics   Good Support System? Yes    Comments His wife is his main support system. He also has a mother, daughter, and son who support him.      Barriers   Psychosocial barriers to participate in program There are no identifiable barriers or psychosocial needs.      Screening Interventions   Interventions Encouraged to exercise    Expected Outcomes Long Term goal: The participant improves quality of Life and PHQ9 Scores as seen by post scores and/or verbalization of changes;Short Term goal: Identification and review with participant of any Quality of Life or Depression concerns found by scoring the questionnaire.             Quality of Life Scores:  Quality of Life - 11/05/20 1346       Quality of Life   Select Quality of Life       Quality of Life Scores   Health/Function Pre 16.88 %    Socioeconomic Pre 23.71 %    Psych/Spiritual Pre 27.43 %    Family Pre 27.6 %    GLOBAL Pre 21.89 %            Scores of 19 and below usually indicate a poorer quality of life in these areas.  A difference of  2-3 points is a clinically meaningful difference.  A difference of 2-3 points in the total score of the Quality of Life Index has been associated with significant improvement in overall quality of life, self-image, physical symptoms, and general health in studies assessing change in quality of life.   PHQ-9: Recent Review Flowsheet Data     Depression screen [facility] 2/9 11/05/2020 09/25/2018 07/02/2018 07/02/2018   Decreased Interest 2 0 0 0   Down, Depressed, Hopeless 1 0 0 0   PHQ - 2 Score 3 0 0 0   Altered sleeping 3 - - -   Tired, decreased energy 3 - - -   Change in appetite 1 - - -   Feeling bad or failure about yourself  0 - - -   Trouble concentrating 0 - - -   Moving slowly or fidgety/restless 0 - - -   Suicidal thoughts 0 - - -   PHQ-9 Score 10 - - -   Difficult doing work/chores Very difficult - - -      Interpretation of Total Score  Total Score Depression Severity:  1-4 = Minimal depression, 5-9 = Mild depression, 10-14 = Moderate depression, 15-19 = Moderately severe depression, 20-27 = Severe depression   Psychosocial Evaluation and Intervention:  Psychosocial Evaluation - 11/05/20 1347       Psychosocial Evaluation & Interventions   Interventions  Encouraged to exercise with the program and follow exercise prescription    Comments Pt has no barriers to completing rehab. Pt has no identifiable psychosocial issues. He has been having some issues sleeping which he attributes to being on prednisone since February. He is beginning to taper his dosage down, so he is hopeful that this will improve his sleep. His PHQ-9 score was a 10. 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 He was  previously very active, but now he is unable to do much without becoming fatigued. He copes well. His support system is strong with his wife, children, and mother. He is hopeful that the pulmonary rehab program will help him get back to his previous physical functioning. His goal is to decrease his SOB and to gain his stamina back. He would like to go back to playing golf and would like to be strong enough to return to work in August. He is excited to start the program.    Expected Outcomes The patient will continue to not have any identifiable psychosocial issues.    Continue Psychosocial Services  No Follow up required             Psychosocial Re-Evaluation:  Psychosocial Re-Evaluation     [place] Name 11/09/20 1232 12/02/20 0856           Psychosocial Re-Evaluation   Current issues with Current Stress Concerns Current Sleep Concerns      Comments Patient is new to the program. He plans to start this week. His insomina is managed with Ambien with no other psychosocial issues identified. We will continue to monitor. Patient has completed 4 session with no psychosocial barriers identified. His insomnia continues to be managed with Ambien. Will continue to monitor.      Expected Outcomes Patient's sleep will continue to be managed with no additional psychosocial issues identified. Patient's sleep will continue to be managed with no psychosocial barriers identified.      Interventions Stress management education;Encouraged to attend Pulmonary Rehabilitation for the exercise;Relaxation education Stress management education;Encouraged to attend Pulmonary Rehabilitation for the exercise;Relaxation education      Continue Psychosocial Services  No Follow up required No Follow up required               Psychosocial Discharge (Final Psychosocial Re-Evaluation):  Psychosocial Re-Evaluation - 12/02/20 0856       Psychosocial Re-Evaluation   Current issues with Current Sleep Concerns     Comments Patient has completed 4 session with no psychosocial barriers identified. His insomnia continues to be managed with Ambien. Will continue to monitor.    Expected Outcomes Patient's sleep will continue to be managed with no psychosocial barriers identified.    Interventions Stress management education;Encouraged to attend Pulmonary Rehabilitation for the exercise;Relaxation education    Continue Psychosocial Services  No Follow up required              Education: Education Goals: Education classes will be provided on a weekly basis, covering required topics. Participant will state understanding/return demonstration of topics presented.  Learning Barriers/Preferences:  Learning Barriers/Preferences - 11/05/20 1245       Learning Barriers/Preferences   Learning Barriers None    Learning Preferences Skilled Demonstration             Education Topics: How Lungs Work and Diseases: - Discuss the anatomy of the lungs and diseases that can affect the lungs, such as COPD.   Exercise: -Discuss the importance of  exercise, FITT principles of exercise, normal and abnormal responses to exercise, and how to exercise safely.   Environmental Irritants: -Discuss types of environmental irritants and how to limit exposure to environmental irritants.   Meds/Inhalers and oxygen: - Discuss respiratory medications, definition of an inhaler and oxygen, and the proper way to use an inhaler and oxygen.   Energy Saving Techniques: - Discuss methods to conserve energy and decrease shortness of breath when performing activities of daily living.    Bronchial Hygiene / Breathing Techniques: - Discuss breathing mechanics, pursed-lip breathing technique,  proper posture, effective ways to clear airways, and other functional breathing techniques   Cleaning Equipment: - Provides group verbal and written instruction about the health risks of elevated stress, cause of high stress, and  healthy ways to reduce stress.   Nutrition I: Fats: - Discuss the types of cholesterol, what cholesterol does to the body, and how cholesterol levels can be controlled.   Nutrition II: Labels: -Discuss the different components of food labels and how to read food labels.   Respiratory Infections: - Discuss the signs and symptoms of respiratory infections, ways to prevent respiratory infections, and the importance of seeking medical treatment when having a respiratory infection. Flowsheet Row PULMONARY REHAB OTHER RESPIRATORY from 12/03/2020 in [place]  Date 11/19/20  Educator DF  Instruction Review Code 2- Demonstrated Understanding       Stress I: Signs and Symptoms: - Discuss the causes of stress, how stress may lead to anxiety and depression, and ways to limit stress.   Stress II: Relaxation: -Discuss relaxation techniques to limit stress. Flowsheet Row PULMONARY REHAB OTHER RESPIRATORY from 12/03/2020 in [place]  Date 12/03/20  Educator DJ  Instruction Review Code 1- Verbalizes Understanding       Oxygen for Home/Travel: - Discuss how to prepare for travel when on oxygen and proper ways to transport and store oxygen to ensure safety.   Knowledge Questionnaire Score:  Knowledge Questionnaire Score - 11/05/20 1246       Knowledge Questionnaire Score   Pre Score 16/18             Core Components/Risk Factors/Patient Goals at Admission:  Personal Goals and Risk Factors at Admission - 11/05/20 1246       Core Components/Risk Factors/Patient Goals on Admission    Weight Management Yes;Weight Maintenance;Weight Loss    Intervention Weight Management: Develop a combined nutrition and exercise program designed to reach desired caloric intake, while maintaining appropriate intake of nutrient and fiber, sodium and fats, and appropriate energy expenditure required for the weight goal.;Weight Management: Provide  education and appropriate resources to help participant work on and attain dietary goals.;Weight Management/Obesity: Establish reasonable short term and long term weight goals.    Expected Outcomes Short Term: Continue to assess and modify interventions until short term weight is achieved;Long Term: Adherence to nutrition and physical activity/exercise program aimed toward attainment of established weight goal;Weight Maintenance: Understanding of the daily nutrition guidelines, which includes 25-35% calories from fat, 7% or less cal from saturated fats, less than 200mg  cholesterol, less than 1.5gm of sodium, & 5 or more servings of fruits and vegetables daily;Weight Loss: Understanding of general recommendations for a balanced deficit meal plan, which promotes 1-2 lb weight loss per week and includes a negative energy balance of [id number] kcal/d;Understanding recommendations for meals to include 15-35% energy as protein, 25-35% energy from fat, 35-60% energy from carbohydrates, less than 200mg  of dietary cholesterol, 20-35 gm of total  fiber daily;Understanding of distribution of calorie intake throughout the day with the consumption of 4-5 meals/snacks    Improve shortness of breath with ADL's Yes    Intervention Provide education, individualized exercise plan and daily activity instruction to help decrease symptoms of SOB with activities of daily living.    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 He plans to start the program 11/10/20.  His personal goals for the program are to decrease his SOB; increase his stamina; play golf again and be able to return to work in August. We will continue to monitor his progress as he works toward meeting these goals. Patient has completed 4 sessions losing 2 lbs since his initial visit. He is doing well in the program with consistent attendance. His O2 saturation averages 97-98% during exercise on RA. His blood pressure is controlled. His pulmonologist gave him a return to work date of 02/02/21. His personal goals for the program are to decrease SOB; increase his stamina; return to work in August and play golf again. We will continue to monitor his progress as he works towards meeting these goals.      Expected Outcomes Patient will complete the program meeting both program and personal goals. Patient will complete the program meeting both program and personal goals.               Core Components/Risk Factors/Patient Goals at Discharge (Final Review):   Goals and Risk Factor Review - 12/02/20 0858       Core Components/Risk Factors/Patient Goals Review   Personal Goals Review Other    Review Patient has completed 4 sessions losing 2 lbs since his initial visit. He is doing well in the program with consistent attendance. His O2 saturation averages 97-98% during exercise on RA. His blood pressure is controlled. His pulmonologist gave him a return to work date of 02/02/21. His personal goals for the program are to decrease SOB; increase his stamina; return to work in August and play golf again. We will continue to monitor his progress as he works towards meeting these goals.    Expected Outcomes Patient will complete the program meeting both program and personal goals.             ITP Comments:   Comments: ITP REVIEW Pt is making expected progress toward pulmonary rehab goals after completing 7 sessions. Recommend continued exercise, life style modification, education, and utilization of  breathing techniques to increase stamina and strength and decrease shortness of breath with exertion.

## 2020-12-10 ENCOUNTER — Other Ambulatory Visit: Payer: Self-pay

## 2020-12-10 ENCOUNTER — Encounter (HOSPITAL_COMMUNITY)
Admission: RE | Admit: 2020-12-10 | Discharge: 2020-12-10 | Disposition: A | Discharge status: Home / Self Care | Payer: 59 | Source: Ambulatory Visit | Attending: Pulmonary Disease | Admitting: Pulmonary Disease

## 2020-12-10 DIAGNOSIS — U099 Post covid-19 condition, unspecified: Secondary | ICD-10-CM

## 2020-12-10 DIAGNOSIS — R0600 Dyspnea, unspecified: Secondary | ICD-10-CM

## 2020-12-10 DIAGNOSIS — R0609 Other forms of dyspnea: Secondary | ICD-10-CM

## 2020-12-10 NOTE — Progress Notes (Signed)
Daily Session Note  Patient Details  Name: Marvin Munoz MRN: [id number] Date of Birth: 07/01/1960 Referring Provider:   Flowsheet Row PULMONARY REHAB OTHER RESP ORIENTATION from 11/05/2020 in [place] [facility]  Referring Provider Dr. Mannam       Encounter Date: 12/10/2020  Check In:  Session Check In - 12/10/20 1045       Check-In   Supervising physician immediately available to respond to emergencies [facility] MD immediately available    Physician(s) Dr. Ross    Location [facility]    Staff Present Debra Johnson, RN, BSN;Dalton Fletcher, MS, ACSM-CEP, Exercise Physiologist    Virtual Visit No    Medication changes reported     No    Fall or balance concerns reported    No    Tobacco Cessation No Change    Warm-up and Cool-down Performed as group-led instruction    Resistance Training Performed Yes    VAD Patient? No    PAD/SET Patient? No      Pain Assessment   Currently in Pain? No/denies    Multiple Pain Sites No             Capillary Blood Glucose: No results found for this or any previous visit (from the past 24 hour(s)).    Social History   Tobacco Use  Smoking Status Never  Smokeless Tobacco Never    Goals Met:  Proper associated with RPD/PD & O2 Sat Independence with exercise equipment Using PLB without cueing & demonstrates good technique Exercise tolerated well No report of cardiac concerns or symptoms Strength training completed today  Goals Unmet:  Not Applicable  Comments: Check out 1145.   Dr. Jehanzeb Memon is Medical Director for [place] [facility]. 

## 2020-12-15 ENCOUNTER — Encounter (HOSPITAL_COMMUNITY)
Admission: RE | Admit: 2020-12-15 | Discharge: 2020-12-15 | Disposition: A | Discharge status: Home / Self Care | Payer: 59 | Source: Ambulatory Visit | Attending: Pulmonary Disease | Admitting: Pulmonary Disease

## 2020-12-15 ENCOUNTER — Other Ambulatory Visit: Payer: Self-pay

## 2020-12-15 DIAGNOSIS — R0600 Dyspnea, unspecified: Secondary | ICD-10-CM | POA: Diagnosis not present

## 2020-12-15 DIAGNOSIS — U099 Post covid-19 condition, unspecified: Secondary | ICD-10-CM

## 2020-12-15 DIAGNOSIS — R0609 Other forms of dyspnea: Secondary | ICD-10-CM

## 2020-12-15 NOTE — Progress Notes (Signed)
Daily Session Note  Patient Details  Name: Marvin Munoz MRN: [id number] Date of Birth: 1960/12/17 Referring Provider:   Flowsheet Row PULMONARY REHAB OTHER RESP ORIENTATION from 11/05/2020 in [place]  Referring Provider Dr. Vaughan Browner       Encounter Date: 12/15/2020  Check In:  Session Check In - 12/15/20 1045       Check-In   Supervising physician immediately available to respond to emergencies [facility] MD immediately available    Physician(s) Dr. Harl Bowie    Location [facility]    Staff Present Hoy Register, MS, ACSM-CEP, Exercise Physiologist;Phyllis Billingsley, RN    Virtual Visit No    Medication changes reported     No    Fall or balance concerns reported    No    Tobacco Cessation No Change    Warm-up and Cool-down Performed as group-led instruction    Resistance Training Performed Yes    VAD Patient? No    PAD/SET Patient? No      Pain Assessment   Currently in Pain? No/denies    Multiple Pain Sites No             Capillary Blood Glucose: No results found for this or any previous visit (from the past 24 hour(s)).    Social History   Tobacco Use  Smoking Status Never  Smokeless Tobacco Never    Goals Met:  Independence with exercise equipment Exercise tolerated well No report of cardiac concerns or symptoms Strength training completed today  Goals Unmet:  Not Applicable  Comments: checkout time is 1145   Dr. Kathie Dike is Medical Director for [facility].

## 2020-12-17 ENCOUNTER — Encounter (HOSPITAL_COMMUNITY)
Admission: RE | Admit: 2020-12-17 | Discharge: 2020-12-17 | Disposition: A | Discharge status: Home / Self Care | Payer: 59 | Source: Ambulatory Visit | Attending: Pulmonary Disease | Admitting: Pulmonary Disease

## 2020-12-17 ENCOUNTER — Other Ambulatory Visit: Payer: Self-pay

## 2020-12-17 DIAGNOSIS — R0600 Dyspnea, unspecified: Secondary | ICD-10-CM

## 2020-12-17 DIAGNOSIS — U099 Post covid-19 condition, unspecified: Secondary | ICD-10-CM

## 2020-12-17 DIAGNOSIS — R0609 Other forms of dyspnea: Secondary | ICD-10-CM

## 2020-12-17 NOTE — Progress Notes (Signed)
Daily Session Note  Patient Details  Name: Marvin Munoz MRN: [id number] Date of Birth: Jun 29, 1960 Referring Provider:   [place] from 11/05/2020 in [place]  Referring Provider Dr. Vaughan Browner       Encounter Date: 12/17/2020  Check In:  Session Check In - 12/17/20 1458       Check-In   Supervising physician immediately available to respond to emergencies [facility] MD immediately available    Physician(s) Dr. Domenic Polite    Location [facility]    Staff Present Geanie Cooley, RN;Dalton Fletcher, MS, ACSM-CEP, Exercise Physiologist    Virtual Visit No    Medication changes reported     No    Fall or balance concerns reported    No    Tobacco Cessation No Change    Warm-up and Cool-down Performed as group-led instruction    Resistance Training Performed Yes    VAD Patient? No    PAD/SET Patient? No      Pain Assessment   Currently in Pain? No/denies    Multiple Pain Sites No             Capillary Blood Glucose: No results found for this or any previous visit (from the past 24 hour(s)).    Social History   Tobacco Use  Smoking Status Never  Smokeless Tobacco Never    Goals Met:  Proper associated with RPD/PD & O2 Sat Independence with exercise equipment Using PLB without cueing & demonstrates good technique Exercise tolerated well No report of cardiac concerns or symptoms Strength training completed today  Goals Unmet:  Not Applicable  Comments: check out @ 4:00pm   Dr. Kathie Dike is Medical Director for [facility].

## 2020-12-22 ENCOUNTER — Encounter (HOSPITAL_COMMUNITY)
Admission: RE | Admit: 2020-12-22 | Discharge: 2020-12-22 | Disposition: A | Discharge status: Home / Self Care | Payer: 59 | Source: Ambulatory Visit | Attending: Pulmonary Disease | Admitting: Pulmonary Disease

## 2020-12-22 ENCOUNTER — Other Ambulatory Visit: Payer: Self-pay

## 2020-12-22 VITALS — Wt 206.1 lb

## 2020-12-22 DIAGNOSIS — R0600 Dyspnea, unspecified: Secondary | ICD-10-CM

## 2020-12-22 DIAGNOSIS — U099 Post covid-19 condition, unspecified: Secondary | ICD-10-CM | POA: Insufficient documentation

## 2020-12-22 DIAGNOSIS — R0609 Other forms of dyspnea: Secondary | ICD-10-CM

## 2020-12-22 NOTE — Progress Notes (Signed)
Daily Session Note  Patient Details  Name: Marvin Munoz MRN: [id number] Date of Birth: 1961-02-11 Referring Provider:   [place] from 11/05/2020 in [place]  Referring Provider Dr. Vaughan Browner       Encounter Date: 12/22/2020  Check In:  Session Check In - 12/22/20 1045       Check-In   Supervising physician immediately available to respond to emergencies [facility] MD immediately available    Physician(s) Dr. Johnsie Cancel    Location [facility]    Staff Present Geanie Cooley, RN;Dalton Kris Mouton, MS, ACSM-CEP, Exercise Physiologist    Virtual Visit No    Medication changes reported     No    Fall or balance concerns reported    No    Tobacco Cessation No Change    Warm-up and Cool-down Performed as group-led instruction    Resistance Training Performed Yes    VAD Patient? No    PAD/SET Patient? No      Pain Assessment   Currently in Pain? No/denies    Multiple Pain Sites No             Capillary Blood Glucose: No results found for this or any previous visit (from the past 24 hour(s)).    Social History   Tobacco Use  Smoking Status Never  Smokeless Tobacco Never    Goals Met:  Proper associated with RPD/PD & O2 Sat Independence with exercise equipment Using PLB without cueing & demonstrates good technique Exercise tolerated well No report of cardiac concerns or symptoms Strength training completed today  Goals Unmet:  Not Applicable  Comments: check out 11:45am   Dr. Kathie Dike is Medical Director for [facility].

## 2020-12-24 ENCOUNTER — Encounter (HOSPITAL_COMMUNITY)
Admission: RE | Admit: 2020-12-24 | Discharge: 2020-12-24 | Disposition: A | Discharge status: Home / Self Care | Payer: 59 | Source: Ambulatory Visit | Attending: Pulmonary Disease | Admitting: Pulmonary Disease

## 2020-12-24 ENCOUNTER — Other Ambulatory Visit: Payer: Self-pay

## 2020-12-24 DIAGNOSIS — R0600 Dyspnea, unspecified: Secondary | ICD-10-CM | POA: Diagnosis not present

## 2020-12-24 DIAGNOSIS — U099 Post covid-19 condition, unspecified: Secondary | ICD-10-CM

## 2020-12-24 DIAGNOSIS — R0609 Other forms of dyspnea: Secondary | ICD-10-CM

## 2020-12-24 NOTE — Progress Notes (Signed)
Daily Session Note  Patient Details  Name: Marvin Munoz MRN: [id number] Date of Birth: 1961-04-17 Referring Provider:   Flowsheet Row PULMONARY REHAB OTHER RESP ORIENTATION from 11/05/2020 in [place]  Referring Provider Dr. Vaughan Browner       Encounter Date: 12/24/2020  Check In:  Session Check In - 12/24/20 1045       Check-In   Supervising physician immediately available to respond to emergencies [facility] MD immediately available    Physician(s) Dr. Harrington Challenger    Location [facility]    Staff Present Hoy Register, MS, ACSM-CEP, Exercise Physiologist;Lattie Cervi Wynetta Emery, RN, BSN    Virtual Visit No    Medication changes reported     No    Tobacco Cessation No Change    Warm-up and Cool-down Performed as group-led instruction    Resistance Training Performed Yes    VAD Patient? No    PAD/SET Patient? No      Pain Assessment   Currently in Pain? No/denies    Multiple Pain Sites No             Capillary Blood Glucose: No results found for this or any previous visit (from the past 24 hour(s)).    Social History   Tobacco Use  Smoking Status Never  Smokeless Tobacco Never    Goals Met:  Proper associated with RPD/PD & O2 Sat Independence with exercise equipment Using PLB without cueing & demonstrates good technique Exercise tolerated well No report of cardiac concerns or symptoms Strength training completed today  Goals Unmet:  Not Applicable  Comments: Check out 1145.   Dr. Kathie Dike is Medical Director for [facility].

## 2020-12-24 NOTE — Progress Notes (Signed)
I have reviewed a Home Exercise Prescription with Marvin Munoz . Marvin Munoz is currently exercising at home.  The patient was advised to walk and do resistance bands 3 days a week for 45 minutes.  Marvin Munoz and I discussed how to progress their exercise prescription.  The patient stated that their goals were to breathe better and increase stamina.  The patient stated that they understand the exercise prescription.  We reviewed exercise guidelines, target heart rate during exercise, RPE Scale, weather conditions, NTG use, endpoints for exercise, warmup and cool down.  Patient is encouraged to come to me with any questions. I will continue to follow up with the patient to assist them with progression and safety.

## 2020-12-29 ENCOUNTER — Encounter (HOSPITAL_COMMUNITY)
Admission: RE | Admit: 2020-12-29 | Discharge: 2020-12-29 | Disposition: A | Discharge status: Home / Self Care | Payer: 59 | Source: Ambulatory Visit | Attending: Pulmonary Disease | Admitting: Pulmonary Disease

## 2020-12-29 ENCOUNTER — Other Ambulatory Visit: Payer: Self-pay

## 2020-12-29 DIAGNOSIS — R0600 Dyspnea, unspecified: Secondary | ICD-10-CM | POA: Diagnosis not present

## 2020-12-29 DIAGNOSIS — U099 Post covid-19 condition, unspecified: Secondary | ICD-10-CM

## 2020-12-29 DIAGNOSIS — R0609 Other forms of dyspnea: Secondary | ICD-10-CM

## 2020-12-29 NOTE — Progress Notes (Signed)
Daily Session Note  Patient Details  Name: Marvin Munoz MRN: [id number] Date of Birth: [birth year] Referring Provider:   [place] from 11/05/2020 in [facility]  Referring Provider Dr. Vaughan Browner       Encounter Date: 12/29/2020  Check In:  Session Check In - 12/29/20 1045       Check-In   Supervising physician immediately available to respond to emergencies [facility] MD immediately available    Physician(s) Dr. Domenic Polite    Location [facility]    Staff Present Geanie Cooley, RN;Dalton Fletcher, MS, ACSM-CEP, Exercise Physiologist    Virtual Visit No    Medication changes reported     No    Fall or balance concerns reported    No    Tobacco Cessation No Change    Warm-up and Cool-down Performed as group-led instruction    Resistance Training Performed Yes    VAD Patient? No    PAD/SET Patient? No      Pain Assessment   Currently in Pain? No/denies    Multiple Pain Sites No             Capillary Blood Glucose: No results found for this or any previous visit (from the past 24 hour(s)).    Social History   Tobacco Use  Smoking Status Never  Smokeless Tobacco Never    Goals Met:  Proper associated with RPD/PD & O2 Sat Independence with exercise equipment Using PLB without cueing & demonstrates good technique Exercise tolerated well No report of cardiac concerns or symptoms Strength training completed today  Goals Unmet:  Not Applicable  Comments: check out @ 11:45am   Dr. Kathie Dike is Medical Director for [facility].

## 2020-12-31 ENCOUNTER — Encounter (HOSPITAL_COMMUNITY)
Admission: RE | Admit: 2020-12-31 | Discharge: 2020-12-31 | Disposition: A | Discharge status: Home / Self Care | Payer: 59 | Source: Ambulatory Visit | Attending: Pulmonary Disease | Admitting: Pulmonary Disease

## 2020-12-31 ENCOUNTER — Other Ambulatory Visit: Payer: Self-pay

## 2020-12-31 DIAGNOSIS — R0609 Other forms of dyspnea: Secondary | ICD-10-CM

## 2020-12-31 DIAGNOSIS — R0600 Dyspnea, unspecified: Secondary | ICD-10-CM

## 2020-12-31 DIAGNOSIS — U099 Post covid-19 condition, unspecified: Secondary | ICD-10-CM

## 2020-12-31 NOTE — Progress Notes (Signed)
Daily Session Note  Patient Details  Name: Marvin Munoz MRN: [id number] Date of Birth: 01/05/1961 Referring Provider:   Flowsheet Row PULMONARY REHAB OTHER RESP ORIENTATION from 11/05/2020 in [facility]  Referring Provider Dr. Mannam       Encounter Date: 12/31/2020  Check In:  Session Check In - 12/31/20 1045       Check-In   Supervising physician immediately available to respond to emergencies [facility] MD immediately available    Physician(s) Dr. McDowell    Location [facility]    Staff Present Debra Johnson, RN, BSN;Dalton Fletcher, MS, ACSM-CEP, Exercise Physiologist    Virtual Visit No    Medication changes reported     No    Fall or balance concerns reported    No    Tobacco Cessation No Change    Warm-up and Cool-down Performed as group-led instruction    Resistance Training Performed Yes    VAD Patient? No    PAD/SET Patient? No      Pain Assessment   Currently in Pain? No/denies    Multiple Pain Sites No             Capillary Blood Glucose: No results found for this or any previous visit (from the past 24 hour(s)).    Social History   Tobacco Use  Smoking Status Never  Smokeless Tobacco Never    Goals Met:  Proper associated with RPD/PD & O2 Sat Independence with exercise equipment Using PLB without cueing & demonstrates good technique Exercise tolerated well No report of cardiac concerns or symptoms Strength training completed today  Goals Unmet:  Not Applicable  Comments: Check out 1145.   Dr. Jehanzeb Memon is Medical Director for [facility]. 

## 2021-01-05 ENCOUNTER — Encounter (HOSPITAL_COMMUNITY): Payer: 59

## 2021-01-06 NOTE — Progress Notes (Signed)
Pulmonary Individual Treatment Plan  Patient Details  Name: Marvin Munoz MRN: [id number] Date of Birth: Jan 18, 1961 Referring Provider:   Flowsheet Row PULMONARY REHAB OTHER RESP ORIENTATION from 11/05/2020 in [facility]  Referring Provider Dr. Vaughan Browner       Initial Encounter Date:  Flowsheet Row PULMONARY REHAB OTHER RESP ORIENTATION from 11/05/2020 in [facility]  Date 11/05/20       Visit Diagnosis: DOE (dyspnea on exertion)  Post-COVID-19 syndrome  Patient's Home Medications on Admission:   Current Outpatient Medications:    allopurinol (ZYLOPRIM) 300 MG tablet, Take 150 mg by mouth every evening. , Disp: , Rfl:    aspirin EC 81 MG tablet, Take 81 mg by mouth daily., Disp: , Rfl:    baclofen (LIORESAL) 10 MG tablet, Take 1 tablet (10 mg total) by mouth 3 (three) times daily. As needed for muscle spasm, Disp: 50 tablet, Rfl: 0   Calcium Carb-Cholecalciferol (CALCIUM + D3 PO), Take 1 tablet by mouth daily. , Disp: , Rfl:    cetirizine (ZYRTEC) 10 MG tablet, Take 10 mg by mouth every morning. , Disp: , Rfl:    diltiazem (CARDIZEM CD) 240 MG 24 hr capsule, Take 240 mg by mouth every evening. , Disp: , Rfl:    FIBER PO, Take 1 capsule by mouth daily., Disp: , Rfl:    fluticasone furoate-vilanterol (BREO ELLIPTA) 200-25 MCG/INH AEPB, Inhale 1 puff into the lungs daily., Disp: 60 each, Rfl: 6   Multiple Vitamin (MULTIVITAMIN) tablet, Take 1 tablet by mouth daily., Disp: , Rfl:    omeprazole (PRILOSEC) 40 MG capsule, Take 40 mg by mouth every morning. , Disp: , Rfl:    predniSONE (DELTASONE) 10 MG tablet, Take 4 tablets (40 mg total) by mouth daily with breakfast., Disp: 120 tablet, Rfl: 2   rizatriptan (MAXALT-MLT) 10 MG disintegrating tablet, Take 10 mg by mouth as needed for migraine. May repeat in 2 hours if needed, Disp: , Rfl:    rOPINIRole (REQUIP) 1 MG tablet, Take 2 mg by mouth daily., Disp: , Rfl:    rosuvastatin (CRESTOR) 5 MG  tablet, TAKE (1) TABLET BY MOUTH ONCE DAILY., Disp: 30 tablet, Rfl: 10   sulfamethoxazole-trimethoprim (BACTRIM DS) 800-160 MG tablet, Take 1 tablet by mouth 3 (three) times a week., Disp: 12 tablet, Rfl: 2   valACYclovir (VALTREX) 1000 MG tablet, Take 1,000 mg by mouth daily as needed (for cold sores). , Disp: , Rfl:    zolpidem (AMBIEN) 10 MG tablet, Take 10 mg by mouth as needed., Disp: , Rfl:   Past Medical History: Past Medical History:  Diagnosis Date   Allergic rhinitis    Eczema    Fever blister    occasional   Foot drop, right 09/24/2014   Gastroesophageal reflux    Gout    05-09-2018 per pt last flare up, 2014   Guillain Barr syndrome ([place]) 1970's   per pt received swine flu vaccination and regular flu vaccination at same time, was told one or the other may have caused guillain barre   HTN (hypertension)    Migraine headache    Mild aortic valve stenosis cardiologist-  dr Daneen Schick   per last echo  02-01-2017   moderately thickended and calcificed leaflets with mild AS and mild AR,  valve area 2.43cm^2   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 Tamala Julian--- per last CTA 02-21-2018 , 28mm    Tobacco Use: Social History   Tobacco Use  Smoking Status Never  Smokeless Tobacco Never    Labs: Recent Review Flowsheet Data     Labs for ITP Cardiac and Pulmonary Rehab Latest Ref Rng & Units 06/18/2019   Cholestrol 100 - 199 mg/dL 153   LDLCALC 0 - 99 mg/dL 85   HDL >39 mg/dL 48   Trlycerides 0 - 149 mg/dL 108       Capillary Blood Glucose: No results found for:  GLUCAP   Pulmonary Assessment Scores:  Pulmonary Assessment Scores     Row Name 11/05/20 1247         ADL UCSD   SOB Score total 64           CAT Score     CAT Score 18           mMRC Score     mMRC Score 1            UCSD: Self-administered rating of dyspnea associated with activities of daily living (ADLs) 6-point scale (0 = "not at all" to 5 = "maximal or unable to do because of breathlessness")  Scoring Scores range from 0 to 120.  Minimally important difference is 5 units  CAT: CAT can identify the health impairment of COPD patients and is better correlated with disease progression.  CAT has a scoring range of zero to 40. The CAT score is classified into four groups of low (less than 10), medium (10 - 20), high (21-30) and very high (31-40) based on the impact level of disease on health status. A CAT score over 10 suggests significant symptoms.  A worsening CAT score could be explained by an exacerbation, poor medication adherence, poor inhaler technique, or progression of COPD or comorbid conditions.  CAT MCID is 2 points  mMRC: mMRC (Modified Medical Research Council) Dyspnea Scale is used to assess the degree of baseline functional disability in patients of respiratory disease due to dyspnea. No minimal important difference is established. A decrease in score of 1 point or greater is considered a positive change.   Pulmonary Function Assessment:   Exercise Target Goals: Exercise Program Goal: Individual exercise prescription set using results from initial 6 min walk test and THRR while considering  patient's activity barriers and safety.   Exercise Prescription Goal: Initial exercise prescription builds to 30-45 minutes a day of aerobic activity, 2-3 days per week.  Home exercise guidelines will be given to patient during program as part of exercise prescription that the participant will acknowledge.  Activity Barriers & Risk Stratification:  Activity  Barriers & Cardiac Risk Stratification - 11/05/20 1237       Activity Barriers & Cardiac Risk Stratification   Activity Barriers Arthritis;Back Problems;Left Knee Replacement;Deconditioning;Shortness of Breath    Cardiac Risk Stratification Moderate             6 Minute Walk:  6 Minute Walk     Row Name 11/05/20 1342         6 Minute Walk   Phase Initial     Distance 1550 feet  Walk Time 6 minutes     # of Rest Breaks 0     MPH 2.94     METS 4.2     RPE 12     Perceived Dyspnea  13     VO2 Peak 14.69     Symptoms No     Resting HR 83 bpm     Resting BP 110/70     Resting Oxygen Saturation  97 %     Exercise Oxygen Saturation  during 6 min walk 94 %     Max Ex. HR 95 bpm     Max Ex. BP 134/68     2 Minute Post BP 104/70           Interval HR     1 Minute HR 92     2 Minute HR 92     3 Minute HR 88     4 Minute HR 92     5 Minute HR 92     6 Minute HR 95     2 Minute Post HR 81     Interval Heart Rate? Yes           Interval Oxygen     Interval Oxygen? Yes     Baseline Oxygen Saturation % 97 %     1 Minute Oxygen Saturation % 95 %     1 Minute Liters of Oxygen 0 L     2 Minute Oxygen Saturation % 96 %     2 Minute Liters of Oxygen 0 L     3 Minute Oxygen Saturation % 94 %     3 Minute Liters of Oxygen 0 L     4 Minute Oxygen Saturation % 96 %     4 Minute Liters of Oxygen 0 L     5 Minute Oxygen Saturation % 96 %     5 Minute Liters of Oxygen 0 L     6 Minute Oxygen Saturation % 98 %     6 Minute Liters of Oxygen 0 L     2 Minute Post Oxygen Saturation % 98 %     2 Minute Post Liters of Oxygen 0 L             Oxygen Initial Assessment:  Oxygen Initial Assessment - 11/05/20 1342       Initial 6 min Walk   Oxygen Used None      Program Oxygen Prescription   Program Oxygen Prescription None      Intervention   Short Term Goals To learn and exhibit compliance with exercise, home and travel O2 prescription;To learn and understand  importance of monitoring SPO2 with pulse oximeter and demonstrate accurate use of the pulse oximeter.;To learn and understand importance of maintaining oxygen saturations>88%;To learn and demonstrate proper pursed lip breathing techniques or other breathing techniques. ;To learn and demonstrate proper use of respiratory medications    Long  Term Goals Exhibits compliance with exercise, home  and travel O2 prescription;Verbalizes importance of monitoring SPO2 with pulse oximeter and return demonstration;Maintenance of O2 saturations>88%;Exhibits proper breathing techniques, such as pursed lip breathing or other method taught during program session;Compliance with respiratory medication             Oxygen Re-Evaluation:  Oxygen Re-Evaluation     [place] Name 11/17/20 1314 12/08/20 1447 01/05/21 1008         Program Oxygen Prescription   Program Oxygen Prescription None None None  Home Oxygen       Home Oxygen Device None None None     Sleep Oxygen Prescription None None None     Home Exercise Oxygen Prescription None None None     Home Resting Oxygen Prescription None None None     Compliance with Home Oxygen Use Yes Yes Yes           Goals/Expected Outcomes       Short Term Goals To learn and exhibit compliance with exercise, home and travel O2 prescription;To learn and understand importance of monitoring SPO2 with pulse oximeter and demonstrate accurate use of the pulse oximeter.;To learn and understand importance of maintaining oxygen saturations>88%;To learn and demonstrate proper pursed lip breathing techniques or other breathing techniques. ;To learn and demonstrate proper use of respiratory medications To learn and exhibit compliance with exercise, home and travel O2 prescription;To learn and understand importance of monitoring SPO2 with pulse oximeter and demonstrate accurate use of the pulse oximeter.;To learn and understand importance of maintaining oxygen saturations>88%;To  learn and demonstrate proper pursed lip breathing techniques or other breathing techniques. ;To learn and demonstrate proper use of respiratory medications To learn and exhibit compliance with exercise, home and travel O2 prescription;To learn and understand importance of monitoring SPO2 with pulse oximeter and demonstrate accurate use of the pulse oximeter.;To learn and understand importance of maintaining oxygen saturations>88%;To learn and demonstrate proper pursed lip breathing techniques or other breathing techniques. ;To learn and demonstrate proper use of respiratory medications     Long  Term Goals Exhibits compliance with exercise, home  and travel O2 prescription;Verbalizes importance of monitoring SPO2 with pulse oximeter and return demonstration;Maintenance of O2 saturations>88%;Exhibits proper breathing techniques, such as pursed lip breathing or other method taught during program session;Compliance with respiratory medication Exhibits compliance with exercise, home  and travel O2 prescription;Verbalizes importance of monitoring SPO2 with pulse oximeter and return demonstration;Maintenance of O2 saturations>88%;Exhibits proper breathing techniques, such as pursed lip breathing or other method taught during program session;Compliance with respiratory medication Exhibits compliance with exercise, home  and travel O2 prescription;Verbalizes importance of monitoring SPO2 with pulse oximeter and return demonstration;Maintenance of O2 saturations>88%;Exhibits proper breathing techniques, such as pursed lip breathing or other method taught during program session;Compliance with respiratory medication     Goals/Expected Outcomes -- compliance compliance             Oxygen Discharge (Final Oxygen Re-Evaluation):  Oxygen Re-Evaluation - 01/05/21 1008       Program Oxygen Prescription   Program Oxygen Prescription None      Home Oxygen   Home Oxygen Device None    Sleep Oxygen Prescription None     Home Exercise Oxygen Prescription None    Home Resting Oxygen Prescription None    Compliance with Home Oxygen Use Yes      Goals/Expected Outcomes   Short Term Goals To learn and exhibit compliance with exercise, home and travel O2 prescription;To learn and understand importance of monitoring SPO2 with pulse oximeter and demonstrate accurate use of the pulse oximeter.;To learn and understand importance of maintaining oxygen saturations>88%;To learn and demonstrate proper pursed lip breathing techniques or other breathing techniques. ;To learn and demonstrate proper use of respiratory medications    Long  Term Goals Exhibits compliance with exercise, home  and travel O2 prescription;Verbalizes importance of monitoring SPO2 with pulse oximeter and return demonstration;Maintenance of O2 saturations>88%;Exhibits proper breathing techniques, such as pursed lip breathing or other method taught during program session;Compliance with respiratory medication  Goals/Expected Outcomes compliance             Initial Exercise Prescription:  Initial Exercise Prescription - 11/05/20 1300       Date of Initial Exercise RX and Referring Provider   Date 11/05/20    Referring Provider Dr. Vaughan Browner    Expected Discharge Date 03/11/21      Treadmill   MPH 1.5    Grade 0    Minutes 17      Recumbant Elliptical   Level 1    RPM 60    Minutes 22      Prescription Details   Frequency (times per week) 2    Duration Progress to 30 minutes of continuous aerobic without signs/symptoms of physical distress      Intensity   THRR 40-80% of Max Heartrate 64-128    Ratings of Perceived Exertion 11-13    Perceived Dyspnea 0-4      Resistance Training   Training Prescription Yes    Weight 4 lbs    Reps 10-15             Perform Capillary Blood Glucose checks as needed.  Exercise Prescription Changes:   Exercise Prescription Changes     Row Name 11/17/20 1300 12/08/20 1400 12/22/20 1200  12/24/20 1300 12/31/20 1008     Response to Exercise   Blood Pressure (Admit) 120/68 96/52 90/52  -- 106/60   Blood Pressure (Exercise) 146/72 106/70 170/76 -- 148/70   Blood Pressure (Exit) 118/72 106/72 86/60 -- 96/64   Heart Rate (Admit) 81 bpm 75 bpm 78 bpm -- 74 bpm   Heart Rate (Exercise) 99 bpm 96 bpm 120 bpm -- 109 bpm   Heart Rate (Exit) 84 bpm 80 bpm 82 bpm -- 76 bpm   Oxygen Saturation (Admit) 96 % 97 % 98 % -- 98 %   Oxygen Saturation (Exercise) 96 % 97 % 96 % -- 96 %   Oxygen Saturation (Exit) 98 % 97 % 98 % -- 97 %   Rating of Perceived Exertion (Exercise) 12 12 12  -- 12   Perceived Dyspnea (Exercise) 12 12 12  -- 12   Duration Continue with 30 min of aerobic exercise without signs/symptoms of physical distress. Continue with 30 min of aerobic exercise without signs/symptoms of physical distress. Continue with 30 min of aerobic exercise without signs/symptoms of physical distress. -- Continue with 30 min of aerobic exercise without signs/symptoms of physical distress.   Intensity THRR unchanged THRR unchanged THRR unchanged -- THRR unchanged     Progression   Progression Continue to progress workloads to maintain intensity without signs/symptoms of physical distress. Continue to progress workloads to maintain intensity without signs/symptoms of physical distress. Continue to progress workloads to maintain intensity without signs/symptoms of physical distress. -- Continue to progress workloads to maintain intensity without signs/symptoms of physical distress.     Resistance Training   Training Prescription Yes Yes Yes -- Yes   Weight 4 lbs 8 lbs 8 lbs -- 8 lbs   Reps 10-15 10-15 10-15 -- 10-15   Time 10 Minutes 10 Minutes 10 Minutes -- 10 Minutes     Treadmill   MPH 1.5 1.7 1.8 -- 1.9   Grade 0 0 2.37 -- 0   Minutes 17 17 22  -- 22   METs 2.15 2.6 2.37 -- 2.45     Recumbant Elliptical   Level 1 3 3  -- 3   RPM 62 74 81 -- 81   Minutes 22 22 17  --  17   METs 3.6 4.5 5 --  4.9     Home Exercise Plan   Plans to continue exercise at -- -- -- Home (comment) --   Frequency -- -- -- Add 3 additional days to program exercise sessions. --   Initial Home Exercises Provided -- -- -- 12/24/20 --            Exercise Comments:   Exercise Comments     Row Name 11/10/20 1200 12/24/20 1303         Exercise Comments Patient completed first exercise session. He tolerated exercise well with no complaints. He is eager to reach his goals in the program. His vitals were normal with exercise. He is very nice and very eager to come back to rehab. home exercise reviewed               Exercise Goals and Review:   Exercise Goals     Row Name 11/05/20 1345 11/17/20 1314 12/08/20 1447 01/05/21 1010       Exercise Goals   Increase Physical Activity Yes Yes Yes Yes    Intervention Provide advice, education, support and counseling about physical activity/exercise needs.;Develop an individualized exercise prescription for aerobic and resistive training based on initial evaluation findings, risk stratification, comorbidities and participant's personal goals. Provide advice, education, support and counseling about physical activity/exercise needs.;Develop an individualized exercise prescription for aerobic and resistive training based on initial evaluation findings, risk stratification, comorbidities and participant's personal goals. Provide advice, education, support and counseling about physical activity/exercise needs.;Develop an individualized exercise prescription for aerobic and resistive training based on initial evaluation findings, risk stratification, comorbidities and participant's personal goals. Provide advice, education, support and counseling about physical activity/exercise needs.;Develop an individualized exercise prescription for aerobic and resistive training based on initial evaluation findings, risk stratification, comorbidities and participant's personal  goals.    Expected Outcomes Short Term: Attend rehab on a regular basis to increase amount of physical activity.;Long Term: Add in home exercise to make exercise part of routine and to increase amount of physical activity.;Long Term: Exercising regularly at least 3-5 days a week. Short Term: Attend rehab on a regular basis to increase amount of physical activity.;Long Term: Add in home exercise to make exercise part of routine and to increase amount of physical activity.;Long Term: Exercising regularly at least 3-5 days a week. Short Term: Attend rehab on a regular basis to increase amount of physical activity.;Long Term: Add in home exercise to make exercise part of routine and to increase amount of physical activity.;Long Term: Exercising regularly at least 3-5 days a week. Short Term: Attend rehab on a regular basis to increase amount of physical activity.;Long Term: Add in home exercise to make exercise part of routine and to increase amount of physical activity.;Long Term: Exercising regularly at least 3-5 days a week.    Increase Strength and Stamina Yes Yes Yes Yes    Intervention Provide advice, education, support and counseling about physical activity/exercise needs.;Develop an individualized exercise prescription for aerobic and resistive training based on initial evaluation findings, risk stratification, comorbidities and participant's personal goals. Provide advice, education, support and counseling about physical activity/exercise needs.;Develop an individualized exercise prescription for aerobic and resistive training based on initial evaluation findings, risk stratification, comorbidities and participant's personal goals. Provide advice, education, support and counseling about physical activity/exercise needs.;Develop an individualized exercise prescription for aerobic and resistive training based on initial evaluation findings, risk stratification, comorbidities and participant's personal goals.  Provide advice, education, support  and counseling about physical activity/exercise needs.;Develop an individualized exercise prescription for aerobic and resistive training based on initial evaluation findings, risk stratification, comorbidities and participant's personal goals.    Expected Outcomes Short Term: Increase workloads from initial exercise prescription for resistance, speed, and METs.;Short Term: Perform resistance training exercises routinely during rehab and add in resistance training at home;Long Term: Improve cardiorespiratory fitness, muscular endurance and strength as measured by increased METs and functional capacity (6MWT) Short Term: Increase workloads from initial exercise prescription for resistance, speed, and METs.;Short Term: Perform resistance training exercises routinely during rehab and add in resistance training at home;Long Term: Improve cardiorespiratory fitness, muscular endurance and strength as measured by increased METs and functional capacity (6MWT) Short Term: Increase workloads from initial exercise prescription for resistance, speed, and METs.;Short Term: Perform resistance training exercises routinely during rehab and add in resistance training at home;Long Term: Improve cardiorespiratory fitness, muscular endurance and strength as measured by increased METs and functional capacity (6MWT) Short Term: Increase workloads from initial exercise prescription for resistance, speed, and METs.;Short Term: Perform resistance training exercises routinely during rehab and add in resistance training at home;Long Term: Improve cardiorespiratory fitness, muscular endurance and strength as measured by increased METs and functional capacity (6MWT)    Able to understand and use rate of perceived exertion (RPE) scale Yes Yes Yes Yes    Intervention Provide education and explanation on how to use RPE scale Provide education and explanation on how to use RPE scale Provide education and  explanation on how to use RPE scale Provide education and explanation on how to use RPE scale    Expected Outcomes Short Term: Able to use RPE daily in rehab to express subjective intensity level;Long Term:  Able to use RPE to guide intensity level when exercising independently Short Term: Able to use RPE daily in rehab to express subjective intensity level;Long Term:  Able to use RPE to guide intensity level when exercising independently Short Term: Able to use RPE daily in rehab to express subjective intensity level;Long Term:  Able to use RPE to guide intensity level when exercising independently Short Term: Able to use RPE daily in rehab to express subjective intensity level;Long Term:  Able to use RPE to guide intensity level when exercising independently    Able to understand and use Dyspnea scale Yes Yes Yes Yes    Intervention Provide education and explanation on how to use Dyspnea scale Provide education and explanation on how to use Dyspnea scale Provide education and explanation on how to use Dyspnea scale Provide education and explanation on how to use Dyspnea scale    Expected Outcomes Short Term: Able to use Dyspnea scale daily in rehab to express subjective sense of shortness of breath during exertion;Long Term: Able to use Dyspnea scale to guide intensity level when exercising independently Short Term: Able to use Dyspnea scale daily in rehab to express subjective sense of shortness of breath during exertion;Long Term: Able to use Dyspnea scale to guide intensity level when exercising independently Short Term: Able to use Dyspnea scale daily in rehab to express subjective sense of shortness of breath during exertion;Long Term: Able to use Dyspnea scale to guide intensity level when exercising independently Short Term: Able to use Dyspnea scale daily in rehab to express subjective sense of shortness of breath during exertion;Long Term: Able to use Dyspnea scale to guide intensity level when  exercising independently    Knowledge and understanding of Target Heart Rate Range (THRR) Yes Yes Yes Yes  Intervention Provide education and explanation of THRR including how the numbers were predicted and where they are located for reference Provide education and explanation of THRR including how the numbers were predicted and where they are located for reference Provide education and explanation of THRR including how the numbers were predicted and where they are located for reference Provide education and explanation of THRR including how the numbers were predicted and where they are located for reference    Expected Outcomes Short Term: Able to state/look up THRR;Long Term: Able to use THRR to govern intensity when exercising independently;Short Term: Able to use daily as guideline for intensity in rehab Short Term: Able to state/look up THRR;Long Term: Able to use THRR to govern intensity when exercising independently;Short Term: Able to use daily as guideline for intensity in rehab Short Term: Able to state/look up THRR;Long Term: Able to use THRR to govern intensity when exercising independently;Short Term: Able to use daily as guideline for intensity in rehab Short Term: Able to state/look up THRR;Long Term: Able to use THRR to govern intensity when exercising independently;Short Term: Able to use daily as guideline for intensity in rehab    Understanding of Exercise Prescription Yes Yes Yes Yes    Intervention Provide education, explanation, and written materials on patient's individual exercise prescription Provide education, explanation, and written materials on patient's individual exercise prescription Provide education, explanation, and written materials on patient's individual exercise prescription Provide education, explanation, and written materials on patient's individual exercise prescription    Expected Outcomes Short Term: Able to explain program exercise prescription;Long Term: Able to  explain home exercise prescription to exercise independently Short Term: Able to explain program exercise prescription;Long Term: Able to explain home exercise prescription to exercise independently Short Term: Able to explain program exercise prescription;Long Term: Able to explain home exercise prescription to exercise independently Short Term: Able to explain program exercise prescription;Long Term: Able to explain home exercise prescription to exercise independently             Exercise Goals Re-Evaluation :  Exercise Goals Re-Evaluation     [place] Name 11/17/20 1310 12/08/20 1447 01/05/21 1010         Exercise Goal Re-Evaluation   Exercise Goals Review Increase Physical Activity;Increase Strength and Stamina;Able to understand and use rate of perceived exertion (RPE) scale;Able to understand and use Dyspnea scale;Knowledge and understanding of Target Heart Rate Range (THRR);Able to check pulse independently;Understanding of Exercise Prescription Increase Physical Activity;Increase Strength and Stamina;Able to understand and use rate of perceived exertion (RPE) scale;Able to understand and use Dyspnea scale;Knowledge and understanding of Target Heart Rate Range (THRR);Able to check pulse independently;Understanding of Exercise Prescription Increase Physical Activity;Increase Strength and Stamina;Able to understand and use rate of perceived exertion (RPE) scale;Able to understand and use Dyspnea scale;Knowledge and understanding of Target Heart Rate Range (THRR);Able to check pulse independently;Understanding of Exercise Prescription     Comments patient has completed 2 exercise sessions. He has tolerated exercise well with no complaints. He ia very eager to get healthier and decrease his shortness of breath. 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 He is excited to come back to rehab. He is currenlty exercising at 3.6 METs on the elliptical. Will continue to  monitor and progress as able. Pt has completed 6 exercise sessions. He is progressing through the program and has been able to increase his workloads. He wants to be able to get back to his prev-COVID functioning.  He continues to exercise at home as well. He is currently exercising at 4.5 METs on the stepper. Will continue to monitor and progress as able. Pt has completed 13 exercise sessions. He continues to progress and increase his workloads. He is walking several days a week at home outside of rehab. He is currently exercising at 4.9 METs on the elliptical. Will continue to monitor and progress as able.     Expected Outcomes Through exercise at rehab and at home, patient will achieve their goals. Through exercise at rehab and at home, patient will achieve their goals. Through exercise at rehab and at home, patient will achieve their goals.              Discharge Exercise Prescription (Final Exercise Prescription Changes):  Exercise Prescription Changes - 12/31/20 1008       Response to Exercise   Blood Pressure (Admit) 106/60    Blood Pressure (Exercise) 148/70    Blood Pressure (Exit) 96/64    Heart Rate (Admit) 74 bpm    Heart Rate (Exercise) 109 bpm    Heart Rate (Exit) 76 bpm    Oxygen Saturation (Admit) 98 %    Oxygen Saturation (Exercise) 96 %    Oxygen Saturation (Exit) 97 %    Rating of Perceived Exertion (Exercise) 12    Perceived Dyspnea (Exercise) 12    Duration Continue with 30 min of aerobic exercise without signs/symptoms of physical distress.    Intensity THRR unchanged      Progression   Progression Continue to progress workloads to maintain intensity without signs/symptoms of physical distress.      Resistance Training   Training Prescription Yes    Weight 8 lbs    Reps 10-15    Time 10 Minutes      Treadmill   MPH 1.9    Grade 0    Minutes 22    METs 2.45      Recumbant Elliptical   Level 3    RPM 81    Minutes 17    METs 4.9              Nutrition:  Target Goals: Understanding of nutrition guidelines, daily intake of sodium 1500mg , cholesterol 200mg , calories 30% from fat and 7% or less from saturated fats, daily to have 5 or more servings of fruits and vegetables.  Biometrics:  Pre Biometrics - 11/17/20 1312       Pre Biometrics   Weight 90.9 kg    BMI (Calculated) 25.05              Nutrition Therapy Plan and Nutrition Goals:  Nutrition Therapy & Goals - 11/09/20 1232       Personal Nutrition Goals   Comments We provide 2 educational sessions on heart healthy nutrition with handouts and offer assistance with RD referral if patient is interested.      Intervention Plan   Intervention Nutrition handout(s) given to patient.             Nutrition Assessments:  MEDIFICTS Score Key: ?70 Need to make dietary changes  40-70 Heart Healthy Diet ? 40 Therapeutic Level Cholesterol Diet   Picture Your Plate Scores: <71 Unhealthy dietary pattern with much room for improvement. 41-50 Dietary pattern unlikely to meet recommendations for good health and room for improvement. 51-60 More healthful dietary pattern, with some room for improvement.  >60 Healthy dietary pattern, although there may be some specific behaviors that could be improved.  Nutrition Goals Re-Evaluation:   Nutrition Goals Discharge (Final Nutrition Goals Re-Evaluation):   Psychosocial: Target Goals: Acknowledge presence or absence of significant depression and/or stress, maximize coping skills, provide positive support system. Participant is able to verbalize types and ability to use techniques and skills needed for reducing stress and depression.  Initial Review & Psychosocial Screening:  Initial Psych Review & Screening - 11/05/20 1239       Initial Review   Current issues with Current Sleep Concerns   Has trouble sleeping due to being on prednisone     Family Dynamics   Good Support System? Yes    Comments His wife  is his main support system. He also has a mother, daughter, and son who support him.      Barriers   Psychosocial barriers to participate in program There are no identifiable barriers or psychosocial needs.      Screening Interventions   Interventions Encouraged to exercise    Expected Outcomes Long Term goal: The participant improves quality of Life and PHQ9 Scores as seen by post scores and/or verbalization of changes;Short Term goal: Identification and review with participant of any Quality of Life or Depression concerns found by scoring the questionnaire.             Quality of Life Scores:  Quality of Life - 11/05/20 1346       Quality of Life   Select Quality of Life      Quality of Life Scores   Health/Function Pre 16.88 %    Socioeconomic Pre 23.71 %    Psych/Spiritual Pre 27.43 %    Family Pre 27.6 %    GLOBAL Pre 21.89 %            Scores of 19 and below usually indicate a poorer quality of life in these areas.  A difference of  2-3 points is a clinically meaningful difference.  A difference of 2-3 points in the total score of the Quality of Life Index has been associated with significant improvement in overall quality of life, self-image, physical symptoms, and general health in studies assessing change in quality of life.   PHQ-9: Recent Review Flowsheet Data     Depression screen [facility] 2/9 11/05/2020 09/25/2018 07/02/2018 07/02/2018   Decreased Interest 2 0 0 0   Down, Depressed, Hopeless 1 0 0 0   PHQ - 2 Score 3 0 0 0   Altered sleeping 3 - - -   Tired, decreased energy 3 - - -   Change in appetite 1 - - -   Feeling bad or failure about yourself  0 - - -   Trouble concentrating 0 - - -   Moving slowly or fidgety/restless 0 - - -   Suicidal thoughts 0 - - -   PHQ-9 Score 10 - - -   Difficult doing work/chores Very difficult - - -      Interpretation of Total Score  Total Score Depression Severity:  1-4 = Minimal depression, 5-9 = Mild depression, 10-14  = Moderate depression, 15-19 = Moderately severe depression, 20-27 = Severe depression   Psychosocial Evaluation and Intervention:  Psychosocial Evaluation - 11/05/20 1347       Psychosocial Evaluation & Interventions   Interventions Encouraged to exercise with the program and follow exercise prescription    Comments Pt has no barriers to completing rehab. Pt has no identifiable psychosocial issues. He has been having some issues sleeping which he attributes to being on  prednisone since February. He is beginning to taper his dosage down, so he is hopeful that this will improve his sleep. His PHQ-9 score was a 10. 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 He was previously very active, but now he is unable to do much without becoming fatigued. He copes well. His support system is strong with his wife, children, and mother. He is hopeful that the pulmonary rehab program will help him get back to his previous physical functioning. His goal is to decrease his SOB and to gain his stamina back. He would like to go back to playing golf and would like to be strong enough to return to work in August. He is excited to start the program.    Expected Outcomes The patient will continue to not have any identifiable psychosocial issues.    Continue Psychosocial Services  No Follow up required             Psychosocial Re-Evaluation:  Psychosocial Re-Evaluation     [place] Name 11/09/20 1232 12/02/20 0856 12/30/20 1245         Psychosocial Re-Evaluation   Current issues with Current Stress Concerns Current Sleep Concerns Current Sleep Concerns     Comments Patient is new to the program. He plans to start this week. His insomina is managed with Ambien with no other psychosocial issues identified. We will continue to monitor. Patient has completed 4 session with no psychosocial barriers identified. His insomnia continues to be managed with Ambien. Will continue to monitor. Patient has  completed 12 session with no psychosocial barriers identified. His insomnia continues to be managed with Ambien. Patient seems to enjoy coming to [place] and demonstrates a very positive outlook and is interested in improving his health. 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 Will continue to monitor.     Expected Outcomes Patient's sleep will continue to be managed with no additional psychosocial issues identified. Patient's sleep will continue to be managed with no psychosocial barriers identified. Patient's sleep will continue to be managed with no psychosocial barriers identified.     Interventions Stress management education;Encouraged to attend Pulmonary Rehabilitation for the exercise;Relaxation education Stress management education;Encouraged to attend Pulmonary Rehabilitation for the exercise;Relaxation education Stress management education;Encouraged to attend Pulmonary Rehabilitation for the exercise;Relaxation education     Continue Psychosocial Services  No Follow up required No Follow up required No Follow up required              Psychosocial Discharge (Final Psychosocial Re-Evaluation):  Psychosocial Re-Evaluation - 12/30/20 1245       Psychosocial Re-Evaluation   Current issues with Current Sleep Concerns    Comments Patient has completed 12 session with no psychosocial barriers identified. His insomnia continues to be managed with Ambien. Patient seems to enjoy coming to [place] and demonstrates a very positive outlook and is interested in improving his health. 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 Will continue to monitor.    Expected Outcomes Patient's sleep will continue to be managed with no psychosocial barriers identified.    Interventions Stress management education;Encouraged to attend Pulmonary Rehabilitation for the exercise;Relaxation education    Continue Psychosocial Services  No Follow up required               Education: Education Goals: Education classes will be provided on a weekly basis, covering required topics. Participant will state understanding/return demonstration of topics presented.  Learning Barriers/Preferences:  Learning Barriers/Preferences - 11/05/20 1245       Learning Barriers/Preferences   Learning Barriers None    Learning Preferences Skilled Demonstration             Education Topics: How Lungs Work and Diseases: - Discuss the anatomy of the lungs and diseases that can affect the lungs, such as COPD. Flowsheet Row PULMONARY REHAB OTHER RESPIRATORY from 12/31/2020 in [place]  Date 12/24/20  Educator DJ  Instruction Review Code 1- Verbalizes Understanding       Exercise: -Discuss the importance of exercise, FITT principles of exercise, normal and abnormal responses to exercise, and how to exercise safely.   Environmental Irritants: -Discuss types of environmental irritants and how to limit exposure to environmental irritants. Flowsheet Row PULMONARY REHAB OTHER RESPIRATORY from 12/31/2020 in [place]  Date 12/31/20  Educator DJ  Instruction Review Code 1- Verbalizes Understanding       Meds/Inhalers and oxygen: - Discuss respiratory medications, definition of an inhaler and oxygen, and the proper way to use an inhaler and oxygen.   Energy Saving Techniques: - Discuss methods to conserve energy and decrease shortness of breath when performing activities of daily living.    Bronchial Hygiene / Breathing Techniques: - Discuss breathing mechanics, pursed-lip breathing technique,  proper posture, effective ways to clear airways, and other functional breathing techniques   Cleaning Equipment: - Provides group verbal and written instruction about the health risks of elevated stress, cause of high stress, and healthy ways to reduce stress.   Nutrition I: Fats: - Discuss the types of cholesterol,  what cholesterol does to the body, and how cholesterol levels can be controlled.   Nutrition II: Labels: -Discuss the different components of food labels and how to read food labels.   Respiratory Infections: - Discuss the signs and symptoms of respiratory infections, ways to prevent respiratory infections, and the importance of seeking medical treatment when having a respiratory infection. Flowsheet Row PULMONARY REHAB OTHER RESPIRATORY from 12/31/2020 in [place]  Date 11/19/20  Educator DF  Instruction Review Code 2- Demonstrated Understanding       Stress I: Signs and Symptoms: - Discuss the causes of stress, how stress may lead to anxiety and depression, and ways to limit stress.   Stress II: Relaxation: -Discuss relaxation techniques to limit stress. Flowsheet Row PULMONARY REHAB OTHER RESPIRATORY from 12/31/2020 in [place]  Date 12/03/20  Educator DJ  Instruction Review Code 1- Verbalizes Understanding       Oxygen for Home/Travel: - Discuss how to prepare for travel when on oxygen and proper ways to transport and store oxygen to ensure safety. Flowsheet Row PULMONARY REHAB OTHER RESPIRATORY from 12/31/2020 in [place]  Date 12/10/20  Educator DJ  Instruction Review Code 1- Verbalizes Understanding       Knowledge Questionnaire Score:  Knowledge Questionnaire Score - 11/05/20 1246       Knowledge Questionnaire Score   Pre Score 16/18             Core Components/Risk Factors/Patient Goals at Admission:  Personal Goals and Risk Factors at Admission - 11/05/20 1246       Core Components/Risk Factors/Patient Goals on Admission    Weight Management Yes;Weight Maintenance;Weight Loss    Intervention Weight Management: Develop a combined nutrition and exercise program designed to reach desired caloric intake, while maintaining appropriate intake of nutrient and fiber, sodium and fats,  and  appropriate energy expenditure required for the weight goal.;Weight Management: Provide education and appropriate resources to help participant work on and attain dietary goals.;Weight Management/Obesity: Establish reasonable short term and long term weight goals.    Expected Outcomes Short Term: Continue to assess and modify interventions until short term weight is achieved;Long Term: Adherence to nutrition and physical activity/exercise program aimed toward attainment of established weight goal;Weight Maintenance: Understanding of the daily nutrition guidelines, which includes 25-35% calories from fat, 7% or less cal from saturated fats, less than 200mg  cholesterol, less than 1.5gm of sodium, & 5 or more servings of fruits and vegetables daily;Weight Loss: Understanding of general recommendations for a balanced deficit meal plan, which promotes 1-2 lb weight loss per week and includes a negative energy balance of [phone] kcal/d;Understanding recommendations for meals to include 15-35% energy as protein, 25-35% energy from fat, 35-60% energy from carbohydrates, less than 200mg  of dietary cholesterol, 20-35 gm of total fiber daily;Understanding of distribution of calorie intake throughout the day with the consumption of 4-5 meals/snacks    Improve shortness of breath with ADL's Yes    Intervention Provide education, individualized exercise plan and daily activity instruction to help decrease symptoms of SOB with activities of daily living.    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 He plans to start the program 11/10/20. His personal goals for the program are to decrease his SOB; increase his stamina; play golf again and be able to return to work in August. We will continue to monitor his progress as he works toward meeting these goals. Patient has completed 4 sessions losing 2 lbs since his initial visit. He is doing well in the program with consistent attendance. His O2 saturation averages 97-98% during exercise on RA. His blood pressure is controlled. His pulmonologist gave him a return to work date of 02/02/21. His personal goals for the program are to decrease SOB; increase his stamina; return to work in August and play golf again. We will continue to monitor his progress as he works towards meeting these goals. Patient has completed 12 sessions gaining 6 lbs since last 30 day reivew. He continues to do well in the program with progressions and consistent attendance. He works hard and puts forth great effort during sessions. His blood pressure is well controlled. His O2 saturation averages 96-97% during exercise on RA. 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 MD discontinued topical nasal spray steriods and instructed him to return if the bleeding continues. His personal goals for the program are to decrease SOB; increase his stamina; play golf; and be able to return to work in August. We will continue to monitor his progress as he works towards meeting these goals.     Expected Outcomes Patient will complete the program meeting both program and personal goals. Patient will complete the program meeting both program and personal goals. Patient will complete the program meeting both program and  personal goals.              Core Components/Risk Factors/Patient Goals at Discharge (Final Review):   Goals and Risk Factor Review - 12/30/20 1247       Core Components/Risk Factors/Patient Goals Review    Personal Goals Review Other    Review Patient has completed 12 sessions gaining 6 lbs since last 30 day reivew. He continues to do well in the program with progressions and consistent attendance. He works hard and puts forth great effort during sessions. His blood pressure is well controlled. His O2 saturation averages 96-97% during exercise on RA. 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 MD discontinued topical nasal spray steriods and instructed him to return if the bleeding continues. His personal goals for the program are to decrease SOB; increase his stamina; play golf; and be able to return to work in August. We will continue to monitor his progress as he works towards meeting these goals.    Expected Outcomes Patient will complete the program meeting both program and personal goals.             ITP Comments:   Comments: ITP REVIEW Pt is making expected progress toward pulmonary rehab goals after completing 14 sessions. Recommend continued exercise, life style modification, education, and utilization of breathing techniques to increase stamina and strength and decrease shortness of breath with exertion.

## 2021-01-07 ENCOUNTER — Encounter (HOSPITAL_COMMUNITY)
Admission: RE | Admit: 2021-01-07 | Discharge: 2021-01-07 | Disposition: A | Discharge status: Home / Self Care | Payer: 59 | Source: Ambulatory Visit | Attending: Pulmonary Disease | Admitting: Pulmonary Disease

## 2021-01-07 ENCOUNTER — Other Ambulatory Visit: Payer: Self-pay

## 2021-01-07 DIAGNOSIS — U099 Post covid-19 condition, unspecified: Secondary | ICD-10-CM

## 2021-01-07 DIAGNOSIS — R0600 Dyspnea, unspecified: Secondary | ICD-10-CM

## 2021-01-07 DIAGNOSIS — R0609 Other forms of dyspnea: Secondary | ICD-10-CM

## 2021-01-07 NOTE — Progress Notes (Signed)
Daily Session Note  Patient Details  Name: Marvin Munoz MRN: [id number] Date of Birth: [birth year] Referring Provider:   Flowsheet Row PULMONARY REHAB OTHER RESP ORIENTATION from 11/05/2020 in [place]  Referring Provider Dr. Vaughan Browner       Encounter Date: 01/07/2021  Check In:  Session Check In - 01/07/21 1045       Check-In   Supervising physician immediately available to respond to emergencies [facility] MD immediately available    Physician(s) Dr. Johnsie Cancel    Location [facility]    Staff Present Hoy Register, MS, ACSM-CEP, Exercise Physiologist;Other    Virtual Visit No    Medication changes reported     No    Fall or balance concerns reported    No    Tobacco Cessation No Change    Warm-up and Cool-down Performed as group-led instruction    Resistance Training Performed Yes    VAD Patient? No    PAD/SET Patient? No      Pain Assessment   Currently in Pain? No/denies    Multiple Pain Sites No             Capillary Blood Glucose: No results found for this or any previous visit (from the past 24 hour(s)).    Social History   Tobacco Use  Smoking Status Never  Smokeless Tobacco Never    Goals Met:  Independence with exercise equipment Exercise tolerated well No report of cardiac concerns or symptoms Strength training completed today  Goals Unmet:  Not Applicable  Comments: checkout time is 1145   Dr. Kathie Dike is Medical Director for [facility].

## 2021-01-12 ENCOUNTER — Encounter (HOSPITAL_COMMUNITY)
Admission: RE | Admit: 2021-01-12 | Discharge: 2021-01-12 | Disposition: A | Discharge status: Home / Self Care | Payer: 59 | Source: Ambulatory Visit | Attending: Pulmonary Disease | Admitting: Pulmonary Disease

## 2021-01-12 ENCOUNTER — Other Ambulatory Visit: Payer: Self-pay

## 2021-01-12 DIAGNOSIS — R0600 Dyspnea, unspecified: Secondary | ICD-10-CM

## 2021-01-12 DIAGNOSIS — R0609 Other forms of dyspnea: Secondary | ICD-10-CM

## 2021-01-12 DIAGNOSIS — U099 Post covid-19 condition, unspecified: Secondary | ICD-10-CM

## 2021-01-12 NOTE — Progress Notes (Signed)
Daily Session Note  Patient Details  Name: Marvin Munoz MRN: [id number] Date of Birth: 09-Sep-1960 Referring Provider:   [place] from 11/05/2020 in [place]  Referring Provider Dr. Vaughan Browner       Encounter Date: 01/12/2021  Check In:  Session Check In - 01/12/21 1045       Check-In   Supervising physician immediately available to respond to emergencies [facility] MD immediately available    Physician(s) Dr. Harl Bowie    Location [facility]    Staff Present Geanie Cooley, RN;Dalton Kris Mouton, MS, ACSM-CEP, Exercise Physiologist    Virtual Visit No    Medication changes reported     No    Fall or balance concerns reported    No    Tobacco Cessation No Change    Warm-up and Cool-down Performed as group-led instruction    Resistance Training Performed Yes    VAD Patient? No    PAD/SET Patient? No      Pain Assessment   Currently in Pain? No/denies    Multiple Pain Sites No             Capillary Blood Glucose: No results found for this or any previous visit (from the past 24 hour(s)).    Social History   Tobacco Use  Smoking Status Never  Smokeless Tobacco Never    Goals Met:  Proper associated with RPD/PD & O2 Sat Independence with exercise equipment Exercise tolerated well No report of cardiac concerns or symptoms Strength training completed today  Goals Unmet:  Not Applicable  Comments: check out @ 11:45am   Dr. Kathie Dike is Medical Director for [facility].

## 2021-01-14 ENCOUNTER — Encounter (HOSPITAL_COMMUNITY)
Admission: RE | Admit: 2021-01-14 | Discharge: 2021-01-14 | Disposition: A | Discharge status: Home / Self Care | Payer: 59 | Source: Ambulatory Visit | Attending: Pulmonary Disease | Admitting: Pulmonary Disease

## 2021-01-14 ENCOUNTER — Other Ambulatory Visit: Payer: Self-pay

## 2021-01-14 DIAGNOSIS — U099 Post covid-19 condition, unspecified: Secondary | ICD-10-CM

## 2021-01-14 DIAGNOSIS — R0600 Dyspnea, unspecified: Secondary | ICD-10-CM | POA: Diagnosis not present

## 2021-01-14 DIAGNOSIS — R0609 Other forms of dyspnea: Secondary | ICD-10-CM

## 2021-01-14 NOTE — Progress Notes (Signed)
Daily Session Note  Patient Details  Name: LAKAI MOREE MRN: [id number] Date of Birth: 19-Mar-1961 Referring Provider:   [place] from 11/05/2020 in [place]  Referring Provider Dr. Vaughan Browner       Encounter Date: 01/14/2021  Check In:  Session Check In - 01/14/21 1045       Check-In   Supervising physician immediately available to respond to emergencies [facility] MD immediately available    Physician(s) Dr. Harl Bowie    Location [facility]    Staff Present Geanie Cooley, RN;Dalton Kris Mouton, MS, ACSM-CEP, Exercise Physiologist    Virtual Visit No    Medication changes reported     No    Fall or balance concerns reported    No    Tobacco Cessation No Change    Warm-up and Cool-down Performed as group-led instruction    Resistance Training Performed Yes    VAD Patient? No    PAD/SET Patient? No      Pain Assessment   Currently in Pain? No/denies    Multiple Pain Sites No             Capillary Blood Glucose: No results found for this or any previous visit (from the past 24 hour(s)).    Social History   Tobacco Use  Smoking Status Never  Smokeless Tobacco Never    Goals Met:  Independence with exercise equipment Using PLB without cueing & demonstrates good technique Exercise tolerated well No report of cardiac concerns or symptoms Strength training completed today  Goals Unmet:  Not Applicable  Comments: check out @ 11:45am   Dr. Kathie Dike is Medical Director for [facility].

## 2021-01-18 ENCOUNTER — Encounter: Payer: Self-pay | Admitting: *Deleted

## 2021-01-18 ENCOUNTER — Telehealth: Payer: Self-pay | Admitting: Pulmonary Disease

## 2021-01-18 ENCOUNTER — Encounter: Payer: Self-pay | Admitting: Pulmonary Disease

## 2021-01-18 ENCOUNTER — Other Ambulatory Visit: Payer: Self-pay

## 2021-01-18 ENCOUNTER — Ambulatory Visit: Payer: 59 | Admitting: Pulmonary Disease

## 2021-01-18 VITALS — BP 136/74 | HR 71 | Ht 75.0 in | Wt 213.2 lb

## 2021-01-18 DIAGNOSIS — U099 Post covid-19 condition, unspecified: Secondary | ICD-10-CM | POA: Diagnosis not present

## 2021-01-18 DIAGNOSIS — R0609 Other forms of dyspnea: Secondary | ICD-10-CM

## 2021-01-18 DIAGNOSIS — R0600 Dyspnea, unspecified: Secondary | ICD-10-CM | POA: Diagnosis not present

## 2021-01-18 NOTE — Telephone Encounter (Signed)
Per pt, pt place of employment needs more information in letter. Per pt Mannam gave him letter today. Letter needs to state: Release to work 8/12 Part time- 8/12-8/26 part time 4 hours a day 8/27-9/10 6 hours a day 9/11 going forward full time hours  Pt will come letter when finished. Please advise [phone]

## 2021-01-18 NOTE — Patient Instructions (Signed)
Hello I am glad you are improving with regard to your breathing Continue pulmonary rehab We will give you a letter saying it is okay to go back to work later this month, start part-time work initially and work-up full-time as you regain your endurance and exercise capacity  Return to clinic in 6 months

## 2021-01-18 NOTE — Telephone Encounter (Signed)
New letter signed by Dr. Vaughan Browner, with Patient requested dates to return to work. Called and spoke with Patient.  Patient requested letter be mailed.  Confirmed Patient mailing address.  Return to work letter placed in sealed envelope for out going mail. Nothing further at this time.

## 2021-01-18 NOTE — Progress Notes (Signed)
Marvin Munoz    [id number]    August 03, 1960  Primary Care Physician:Gates, Herbie [place], MD  Referring Physician: Josetta Huddle, MD [address] [place],  [place] [address]  Chief complaint: Follow-up for post COVID-20  HPI: 60 year old with mild asthma not on inhalers, thoracic aneurysm, aortic regurgitation Developed COVID-19 in February 2022.  He received monoclonal antibody therapy on 2/16.  Did not require hospitalization CTA done by cardiologist as routine for thoracic aneurysm showed bilateral infiltrates consistent with COVID-19.  Follow-up imaging shows persistent pulmonary infiltrates on chest x-ray  He has persistent dyspnea on exertion, dry cough since then.  Treated with 2 rounds of prednisone for 7 days each and 2 rounds of antibiotics, given albuterol rescue inhaler without change.  Denies any fevers, chills, wheeze Currently on short-term disability from his job  Pets: No pets Occupation: Designer, industrial/product for [employer] Exposures: No mold, hot tub, Jacuzzi, no feather pillows or comforter Smoking history: Never smoker Travel history: No significant travel history Relevant family history: Mom had COPD.  She was a smoker.  Interim history: He has been tapered off prednisone in June 2022 Started pulmonary rehab last month and feels that is helping with his breathing Continues on short-term the stability and is due to go back to work on 8/12  Overall has dyspnea on exertion which is slightly better as stated above.  No cough, fevers, chills Taken off breo inhaler assessment obstruction on PFTs  Outpatient Encounter Medications as of 01/18/2021  Medication Sig   allopurinol (ZYLOPRIM) 300 MG tablet Take 150 mg by mouth every evening.    aspirin EC 81 MG tablet Take 81 mg by mouth daily.   baclofen (LIORESAL) 10 MG tablet Take 1 tablet (10 mg total) by mouth 3 (three) times daily. As needed for muscle spasm   Calcium  Carb-Cholecalciferol (CALCIUM + D3 PO) Take 1 tablet by mouth daily.    cetirizine (ZYRTEC) 10 MG tablet Take 10 mg by mouth every morning.    diltiazem (CARDIZEM CD) 180 MG 24 hr capsule Take 180 mg by mouth daily.   FIBER PO Take 1 capsule by mouth daily.   fluticasone furoate-vilanterol (BREO ELLIPTA) 200-25 MCG/INH AEPB Inhale 1 puff into the lungs daily.   Multiple Vitamin (MULTIVITAMIN) tablet Take 1 tablet by mouth daily.   omeprazole (PRILOSEC) 40 MG capsule Take 40 mg by mouth every morning.    predniSONE (DELTASONE) 10 MG tablet Take 4 tablets (40 mg total) by mouth daily with breakfast.   rizatriptan (MAXALT-MLT) 10 MG disintegrating tablet Take 10 mg by mouth as needed for migraine. May repeat in 2 hours if needed   rOPINIRole (REQUIP) 1 MG tablet Take 2 mg by mouth daily.   rosuvastatin (CRESTOR) 5 MG tablet TAKE (1) TABLET BY MOUTH ONCE DAILY.   sulfamethoxazole-trimethoprim (BACTRIM DS) 800-160 MG tablet Take 1 tablet by mouth 3 (three) times a week.   valACYclovir (VALTREX) 1000 MG tablet Take 1,000 mg by mouth daily as needed (for cold sores).    zolpidem (AMBIEN) 10 MG tablet Take 10 mg by mouth as needed.   [DISCONTINUED] diltiazem (CARDIZEM CD) 240 MG 24 hr capsule Take by mouth every evening.   No facility-administered encounter medications on file as of 01/18/2021.   Physical Exam: Blood pressure 136/74, pulse 71, height '6\' 3"'$  (1.905 m), weight 213 lb 3.2 oz (96.7 kg), SpO2 98 %. Gen:      No acute distress  HEENT:  EOMI, sclera anicteric Neck:     No masses; no thyromegaly Lungs:    Clear to auscultation bilaterally; normal respiratory effort CV:         Regular rate and rhythm; no murmurs Abd:      + bowel sounds; soft, non-tender; no palpable masses, no distension Ext:    No edema; adequate peripheral perfusion Skin:      Warm and dry; no rash Neuro: alert and oriented x 3 Psych: normal mood and affect   Data Reviewed: Imaging: CTA 08/17/2020-calcification of the  aortic valve, multifocal peripheral groundglass opacities bilaterally. Chest x-ray 09/07/2020-persistent bilateral infiltrates Chest x-ray 09/14/2020-slight improvement in lung infiltrate CTA 09/24/2020- no PE, improvement in bilateral groundglass opacities, coronary artery disease I have reviewed the images personally.  PFTs: 10/26/2020 FVC 4.81 [86%], FEV1 4.06 [96%], F/F 84, TLC 7.45 [94%], DLCO 25.71 [81%] Normal test  Labs: CBC 09/22/2020-WBC 4, eos 1.8%, absolute eosinophil count 72 proBNP 09/23/2018 2-101 D-dimer 09/22/2020-1.65  Cardiac: Echocardiogram 04/15/2019-LVEF 60 to 65%, mild LVH normal RV size and function, mild to moderate aortic regurgitation  Assessment:  Post COVID-19 No significant ILD on CTA and PFTs are normal He has started pulmonary rehab which is helping  Letter given stating it is okay for him to go back to work.  Recommend light, part-time schedule in the beginning and increase as his endurance and exercise capacity improves  Plan/Recommendations: Continue pulmonary rehab, exercise Letter given clearing back to work. Follow-up in 6 months  Marshell Garfinkel MD [facility] 01/18/2021, 8:57 AM  CC: Josetta Huddle, MD

## 2021-01-19 ENCOUNTER — Encounter (HOSPITAL_COMMUNITY): Payer: 59

## 2021-01-21 ENCOUNTER — Encounter (HOSPITAL_COMMUNITY)
Admission: RE | Admit: 2021-01-21 | Discharge: 2021-01-21 | Disposition: A | Discharge status: Home / Self Care | Payer: 59 | Source: Ambulatory Visit | Attending: Pulmonary Disease | Admitting: Pulmonary Disease

## 2021-01-21 ENCOUNTER — Other Ambulatory Visit: Payer: Self-pay

## 2021-01-21 DIAGNOSIS — R0609 Other forms of dyspnea: Secondary | ICD-10-CM

## 2021-01-21 DIAGNOSIS — U099 Post covid-19 condition, unspecified: Secondary | ICD-10-CM | POA: Diagnosis present

## 2021-01-21 DIAGNOSIS — R0600 Dyspnea, unspecified: Secondary | ICD-10-CM | POA: Insufficient documentation

## 2021-01-21 NOTE — Progress Notes (Signed)
Daily Session Note  Patient Details  Name: Marvin Munoz MRN: [id number] Date of Birth: March 04, 1961 Referring Provider:   [place] from 11/05/2020 in [place]  Referring Provider Dr. Vaughan Browner       Encounter Date: 01/21/2021  Check In:  Session Check In - 01/21/21 1045       Check-In   Supervising physician immediately available to respond to emergencies [facility] MD immediately available    Physician(s) Dr. Domenic Polite    Location [facility]    Staff Present Aundra Dubin, RN, Bjorn Loser, MS, ACSM-CEP, Exercise Physiologist    Virtual Visit No    Medication changes reported     No    Fall or balance concerns reported    No    Tobacco Cessation No Change    Warm-up and Cool-down Performed as group-led instruction    Resistance Training Performed Yes    VAD Patient? No    PAD/SET Patient? No      Pain Assessment   Currently in Pain? No/denies    Multiple Pain Sites No             Capillary Blood Glucose: No results found for this or any previous visit (from the past 24 hour(s)).    Social History   Tobacco Use  Smoking Status Never  Smokeless Tobacco Never    Goals Met:  Proper associated with RPD/PD & O2 Sat Independence with exercise equipment Using PLB without cueing & demonstrates good technique Exercise tolerated well No report of cardiac concerns or symptoms Strength training completed today  Goals Unmet:  Not Applicable  Comments: Check out 1145.   Dr. Kathie Dike is Medical Director for [facility].

## 2021-01-26 ENCOUNTER — Other Ambulatory Visit: Payer: Self-pay

## 2021-01-26 ENCOUNTER — Encounter (HOSPITAL_COMMUNITY)
Admission: RE | Admit: 2021-01-26 | Discharge: 2021-01-26 | Disposition: A | Discharge status: Home / Self Care | Payer: 59 | Source: Ambulatory Visit | Attending: Pulmonary Disease | Admitting: Pulmonary Disease

## 2021-01-26 DIAGNOSIS — R0609 Other forms of dyspnea: Secondary | ICD-10-CM

## 2021-01-26 DIAGNOSIS — U099 Post covid-19 condition, unspecified: Secondary | ICD-10-CM

## 2021-01-26 DIAGNOSIS — R0600 Dyspnea, unspecified: Secondary | ICD-10-CM | POA: Diagnosis not present

## 2021-01-26 NOTE — Progress Notes (Signed)
Daily Session Note  Patient Details  Name: Marvin Munoz MRN: [id number] Date of Birth: [birth year] Referring Provider:   [place] from 11/05/2020 in [place]  Referring Provider Dr. Vaughan Browner       Encounter Date: 01/26/2021  Check In:  Session Check In - 01/26/21 1045       Check-In   Supervising physician immediately available to respond to emergencies [facility] MD immediately available    Physician(s) Dr. Johnsie Cancel    Location [facility]    Staff Present Aundra Dubin, RN, Bjorn Loser, MS, ACSM-CEP, Exercise Physiologist    Virtual Visit No    Medication changes reported     No    Fall or balance concerns reported    No    Tobacco Cessation No Change    Warm-up and Cool-down Performed as group-led instruction    Resistance Training Performed Yes    VAD Patient? No    PAD/SET Patient? No      Pain Assessment   Currently in Pain? No/denies    Multiple Pain Sites No             Capillary Blood Glucose: No results found for this or any previous visit (from the past 24 hour(s)).    Social History   Tobacco Use  Smoking Status Never  Smokeless Tobacco Never    Goals Met:  Proper associated with RPD/PD & O2 Sat Independence with exercise equipment Using PLB without cueing & demonstrates good technique Exercise tolerated well No report of cardiac concerns or symptoms Strength training completed today  Goals Unmet:  Not Applicable  Comments: check out @ 11:45am   Dr. Kathie Dike is Medical Director for [facility] Pul[facility] Re[facility].

## 2021-01-28 ENCOUNTER — Encounter (HOSPITAL_COMMUNITY): Payer: 59

## 2021-02-02 ENCOUNTER — Encounter (HOSPITAL_COMMUNITY)
Admission: RE | Admit: 2021-02-02 | Discharge: 2021-02-02 | Disposition: A | Discharge status: Home / Self Care | Payer: 59 | Source: Ambulatory Visit | Attending: Pulmonary Disease | Admitting: Pulmonary Disease

## 2021-02-02 ENCOUNTER — Other Ambulatory Visit: Payer: Self-pay

## 2021-02-02 VITALS — Wt 211.9 lb

## 2021-02-02 DIAGNOSIS — U099 Post covid-19 condition, unspecified: Secondary | ICD-10-CM

## 2021-02-02 DIAGNOSIS — R0609 Other forms of dyspnea: Secondary | ICD-10-CM

## 2021-02-02 DIAGNOSIS — R0600 Dyspnea, unspecified: Secondary | ICD-10-CM

## 2021-02-02 NOTE — Progress Notes (Signed)
Daily Session Note  Patient Details  Name: Marvin Munoz MRN: [id number] Date of Birth: 1960/10/06 Referring Provider:   [place] from 11/05/2020 in [place]  Referring Provider Dr. Vaughan Browner       Encounter Date: 02/02/2021  Check In:  Session Check In - 02/02/21 1045       Check-In   Supervising physician immediately available to respond to emergencies [facility] MD immediately available    Physician(s) Dr. Domenic Polite    Location [facility]    Staff Present Geanie Cooley, RN;Dalton Fletcher, MS, ACSM-CEP, Exercise Physiologist    Virtual Visit No    Medication changes reported     No    Fall or balance concerns reported    No    Tobacco Cessation No Change    Warm-up and Cool-down Performed as group-led instruction    Resistance Training Performed Yes      Pain Assessment   Currently in Pain? No/denies    Multiple Pain Sites No             Capillary Blood Glucose: No results found for this or any previous visit (from the past 24 hour(s)).    Social History   Tobacco Use  Smoking Status Never  Smokeless Tobacco Never    Goals Met:  Proper associated with RPD/PD & O2 Sat Independence with exercise equipment Exercise tolerated well No report of cardiac concerns or symptoms Strength training completed today  Goals Unmet:  Not Applicable  Comments: check out @ 11:45 am   Dr. Kathie Dike is Medical Director for [facility] Re[facility].

## 2021-02-03 NOTE — Progress Notes (Signed)
Pulmonary Individual Treatment Plan  Patient Details  Name: Marvin Munoz MRN: [id number] Date of Birth: 1960/09/25 Referring Provider:   [facility] from 11/05/2020 in [facility]  Referring Provider Dr. Vaughan Browner       Initial Encounter Date:  Flowsheet Row PULMONARY REHAB OTHER RESP ORIENTATION from 11/05/2020 in [facility]  Date 11/05/20       Visit Diagnosis: DOE (dyspnea on exertion)  Post-COVID-19 syndrome  Dyspnea on exertion  Patient's Home Medications on Admission:   Current Outpatient Medications:    allopurinol (ZYLOPRIM) 300 MG tablet, Take 150 mg by mouth every evening. , Disp: , Rfl:    aspirin EC 81 MG tablet, Take 81 mg by mouth daily., Disp: , Rfl:    baclofen (LIORESAL) 10 MG tablet, Take 1 tablet (10 mg total) by mouth 3 (three) times daily. As needed for muscle spasm, Disp: 50 tablet, Rfl: 0   Calcium Carb-Cholecalciferol (CALCIUM + D3 PO), Take 1 tablet by mouth daily. , Disp: , Rfl:    cetirizine (ZYRTEC) 10 MG tablet, Take 10 mg by mouth every morning. , Disp: , Rfl:    diltiazem (CARDIZEM CD) 180 MG 24 hr capsule, Take 180 mg by mouth daily., Disp: , Rfl:    FIBER PO, Take 1 capsule by mouth daily., Disp: , Rfl:    fluticasone furoate-vilanterol (BREO ELLIPTA) 200-25 MCG/INH AEPB, Inhale 1 puff into the lungs daily., Disp: 60 each, Rfl: 6   Multiple Vitamin (MULTIVITAMIN) tablet, Take 1 tablet by mouth daily., Disp: , Rfl:    omeprazole (PRILOSEC) 40 MG capsule, Take 40 mg by mouth every morning. , Disp: , Rfl:    predniSONE (DELTASONE) 10 MG tablet, Take 4 tablets (40 mg total) by mouth daily with breakfast., Disp: 120 tablet, Rfl: 2   rizatriptan (MAXALT-MLT) 10 MG disintegrating tablet, Take 10 mg by mouth as needed for migraine. May repeat in 2 hours if needed, Disp: , Rfl:    rOPINIRole (REQUIP) 1 MG tablet, Take 2 mg by mouth daily., Disp: , Rfl:    rosuvastatin  (CRESTOR) 5 MG tablet, TAKE (1) TABLET BY MOUTH ONCE DAILY., Disp: 30 tablet, Rfl: 10   sulfamethoxazole-trimethoprim (BACTRIM DS) 800-160 MG tablet, Take 1 tablet by mouth 3 (three) times a week., Disp: 12 tablet, Rfl: 2   valACYclovir (VALTREX) 1000 MG tablet, Take 1,000 mg by mouth daily as needed (for cold sores). , Disp: , Rfl:    zolpidem (AMBIEN) 10 MG tablet, Take 10 mg by mouth as needed., Disp: , Rfl:   Past Medical History: Past Medical History:  Diagnosis Date   Allergic rhinitis    Eczema    Fever blister    occasional   Foot drop, right 09/24/2014   Gastroesophageal reflux    Gout    05-09-2018 per pt last flare up, 2014   Guillain Barr syndrome ([place]) 1970's   per pt received swine flu vaccination and regular flu vaccination at same time, was told one or the other may have caused guillain barre   HTN (hypertension)    Migraine headache    Mild aortic valve stenosis cardiologist-  dr Daneen Schick   per last echo  02-01-2017   moderately thickended and calcificed leaflets with mild AS and mild AR,  valve area 2.43cm^2   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 Tamala Julian--- per last CTA 02-21-2018 , 78m    Tobacco Use: Social History   Tobacco Use  Smoking Status Never  Smokeless Tobacco Never    Labs: Recent Review Flowsheet Data     Labs for ITP Cardiac and Pulmonary Rehab Latest Ref Rng & Units 06/18/2019   Cholestrol 100 - 199 mg/dL 153   LDLCALC 0 - 99 mg/dL 85   HDL >39 mg/dL 48   Trlycerides 0 - 149 mg/dL 108       Capillary Blood Glucose: No results found for:  GLUCAP   Pulmonary Assessment Scores:  Pulmonary Assessment Scores     Row Name 11/05/20 1247         ADL UCSD   SOB Score total 64           CAT Score   CAT Score 18           mMRC Score   mMRC Score 1             UCSD: Self-administered rating of dyspnea associated with activities of daily living (ADLs) 6-point scale (0 = "not at all" to 5 = "maximal or unable to do because of breathlessness")  Scoring Scores range from 0 to 120.  Minimally important difference is 5 units  CAT: CAT can identify the health impairment of COPD patients and is better correlated with disease progression.  CAT has a scoring range of zero to 40. The CAT score is classified into four groups of low (less than 10), medium (10 - 20), high (21-30) and very high (31-40) based on the impact level of disease on health status. A CAT score over 10 suggests significant symptoms.  A worsening CAT score could be explained by an exacerbation, poor medication adherence, poor inhaler technique, or progression of COPD or comorbid conditions.  CAT MCID is 2 points  mMRC: mMRC (Modified Medical Research Council) Dyspnea Scale is used to assess the degree of baseline functional disability in patients of respiratory disease due to dyspnea. No minimal important difference is established. A decrease in score of 1 point or greater is considered a positive change.   Pulmonary Function Assessment:   Exercise Target Goals: Exercise Program Goal: Individual exercise prescription set using results from initial 6 min walk test and THRR while considering  patient's activity barriers and safety.   Exercise Prescription Goal: Initial exercise prescription builds to 30-45 minutes a day of aerobic activity, 2-3 days per week.  Home exercise guidelines will be given to patient during program as part of exercise prescription that the participant will acknowledge.  Activity Barriers & Risk Stratification:  Activity Barriers  & Cardiac Risk Stratification - 11/05/20 1237       Activity Barriers & Cardiac Risk Stratification   Activity Barriers Arthritis;Back Problems;Left Knee Replacement;Deconditioning;Shortness of Breath    Cardiac Risk Stratification Moderate             6 Minute Walk:  6 Minute Walk     Row Name 11/05/20 1342         6 Minute Walk   Phase Initial     Distance 1550 feet  Walk Time 6 minutes     # of Rest Breaks 0     MPH 2.94     METS 4.2     RPE 12     Perceived Dyspnea  13     VO2 Peak 14.69     Symptoms No     Resting HR 83 bpm     Resting BP 110/70     Resting Oxygen Saturation  97 %     Exercise Oxygen Saturation  during 6 min walk 94 %     Max Ex. HR 95 bpm     Max Ex. BP 134/68     2 Minute Post BP 104/70           Interval HR   1 Minute HR 92     2 Minute HR 92     3 Minute HR 88     4 Minute HR 92     5 Minute HR 92     6 Minute HR 95     2 Minute Post HR 81     Interval Heart Rate? Yes           Interval Oxygen   Interval Oxygen? Yes     Baseline Oxygen Saturation % 97 %     1 Minute Oxygen Saturation % 95 %     1 Minute Liters of Oxygen 0 L     2 Minute Oxygen Saturation % 96 %     2 Minute Liters of Oxygen 0 L     3 Minute Oxygen Saturation % 94 %     3 Minute Liters of Oxygen 0 L     4 Minute Oxygen Saturation % 96 %     4 Minute Liters of Oxygen 0 L     5 Minute Oxygen Saturation % 96 %     5 Minute Liters of Oxygen 0 L     6 Minute Oxygen Saturation % 98 %     6 Minute Liters of Oxygen 0 L     2 Minute Post Oxygen Saturation % 98 %     2 Minute Post Liters of Oxygen 0 L              Oxygen Initial Assessment:  Oxygen Initial Assessment - 11/05/20 1342       Initial 6 min Walk   Oxygen Used None      Program Oxygen Prescription   Program Oxygen Prescription None      Intervention   Short Term Goals To learn and exhibit compliance with exercise, home and travel O2 prescription;To learn and understand importance of  monitoring SPO2 with pulse oximeter and demonstrate accurate use of the pulse oximeter.;To learn and understand importance of maintaining oxygen saturations>88%;To learn and demonstrate proper pursed lip breathing techniques or other breathing techniques. ;To learn and demonstrate proper use of respiratory medications    Long  Term Goals Exhibits compliance with exercise, home  and travel O2 prescription;Verbalizes importance of monitoring SPO2 with pulse oximeter and return demonstration;Maintenance of O2 saturations>88%;Exhibits proper breathing techniques, such as pursed lip breathing or other method taught during program session;Compliance with respiratory medication             Oxygen Re-Evaluation:  Oxygen Re-Evaluation     [place] Name 11/17/20 1314 12/08/20 1447 01/05/21 1008 02/02/21 1209       Program Oxygen Prescription   Program Oxygen Prescription None None None None  Home Oxygen   Home Oxygen Device None None None None    Sleep Oxygen Prescription None None None None    Home Exercise Oxygen Prescription None None None None    Home Resting Oxygen Prescription None None None None    Compliance with Home Oxygen Use Yes Yes Yes Yes         Goals/Expected Outcomes   Short Term Goals To learn and exhibit compliance with exercise, home and travel O2 prescription;To learn and understand importance of monitoring SPO2 with pulse oximeter and demonstrate accurate use of the pulse oximeter.;To learn and understand importance of maintaining oxygen saturations>88%;To learn and demonstrate proper pursed lip breathing techniques or other breathing techniques. ;To learn and demonstrate proper use of respiratory medications To learn and exhibit compliance with exercise, home and travel O2 prescription;To learn and understand importance of monitoring SPO2 with pulse oximeter and demonstrate accurate use of the pulse oximeter.;To learn and understand importance of maintaining oxygen  saturations>88%;To learn and demonstrate proper pursed lip breathing techniques or other breathing techniques. ;To learn and demonstrate proper use of respiratory medications To learn and exhibit compliance with exercise, home and travel O2 prescription;To learn and understand importance of monitoring SPO2 with pulse oximeter and demonstrate accurate use of the pulse oximeter.;To learn and understand importance of maintaining oxygen saturations>88%;To learn and demonstrate proper pursed lip breathing techniques or other breathing techniques. ;To learn and demonstrate proper use of respiratory medications To learn and exhibit compliance with exercise, home and travel O2 prescription;To learn and understand importance of monitoring SPO2 with pulse oximeter and demonstrate accurate use of the pulse oximeter.;To learn and understand importance of maintaining oxygen saturations>88%;To learn and demonstrate proper pursed lip breathing techniques or other breathing techniques. ;To learn and demonstrate proper use of respiratory medications    Long  Term Goals Exhibits compliance with exercise, home  and travel O2 prescription;Verbalizes importance of monitoring SPO2 with pulse oximeter and return demonstration;Maintenance of O2 saturations>88%;Exhibits proper breathing techniques, such as pursed lip breathing or other method taught during program session;Compliance with respiratory medication Exhibits compliance with exercise, home  and travel O2 prescription;Verbalizes importance of monitoring SPO2 with pulse oximeter and return demonstration;Maintenance of O2 saturations>88%;Exhibits proper breathing techniques, such as pursed lip breathing or other method taught during program session;Compliance with respiratory medication Exhibits compliance with exercise, home  and travel O2 prescription;Verbalizes importance of monitoring SPO2 with pulse oximeter and return demonstration;Maintenance of O2 saturations>88%;Exhibits  proper breathing techniques, such as pursed lip breathing or other method taught during program session;Compliance with respiratory medication Exhibits compliance with exercise, home  and travel O2 prescription;Verbalizes importance of monitoring SPO2 with pulse oximeter and return demonstration;Maintenance of O2 saturations>88%;Exhibits proper breathing techniques, such as pursed lip breathing or other method taught during program session;Compliance with respiratory medication    Goals/Expected Outcomes -- compliance compliance compliance             Oxygen Discharge (Final Oxygen Re-Evaluation):  Oxygen Re-Evaluation - 02/02/21 1209       Program Oxygen Prescription   Program Oxygen Prescription None      Home Oxygen   Home Oxygen Device None    Sleep Oxygen Prescription None    Home Exercise Oxygen Prescription None    Home Resting Oxygen Prescription None    Compliance with Home Oxygen Use Yes      Goals/Expected Outcomes   Short Term Goals To learn and exhibit compliance with exercise, home and travel O2 prescription;To learn and understand importance of  monitoring SPO2 with pulse oximeter and demonstrate accurate use of the pulse oximeter.;To learn and understand importance of maintaining oxygen saturations>88%;To learn and demonstrate proper pursed lip breathing techniques or other breathing techniques. ;To learn and demonstrate proper use of respiratory medications    Long  Term Goals Exhibits compliance with exercise, home  and travel O2 prescription;Verbalizes importance of monitoring SPO2 with pulse oximeter and return demonstration;Maintenance of O2 saturations>88%;Exhibits proper breathing techniques, such as pursed lip breathing or other method taught during program session;Compliance with respiratory medication    Goals/Expected Outcomes compliance             Initial Exercise Prescription:  Initial Exercise Prescription - 11/05/20 1300       Date of Initial  Exercise RX and Referring Provider   Date 11/05/20    Referring Provider Dr. Vaughan Browner    Expected Discharge Date 03/11/21      Treadmill   MPH 1.5    Grade 0    Minutes 17      Recumbant Elliptical   Level 1    RPM 60    Minutes 22      Prescription Details   Frequency (times per week) 2    Duration Progress to 30 minutes of continuous aerobic without signs/symptoms of physical distress      Intensity   THRR 40-80% of Max Heartrate 64-128    Ratings of Perceived Exertion 11-13    Perceived Dyspnea 0-4      Resistance Training   Training Prescription Yes    Weight 4 lbs    Reps 10-15             Perform Capillary Blood Glucose checks as needed.  Exercise Prescription Changes:   Exercise Prescription Changes     Row Name 11/17/20 1300 12/08/20 1400 12/22/20 1200 12/24/20 1300 12/31/20 1008     Response to Exercise   Blood Pressure (Admit) 1'20/68 96/52 90/52 '$ -- 106/60   Blood Pressure (Exercise) 146/72 106/70 170/76 -- 148/70   Blood Pressure (Exit) 118/72 106/72 86/60 -- 96/64   Heart Rate (Admit) 81 bpm 75 bpm 78 bpm -- 74 bpm   Heart Rate (Exercise) 99 bpm 96 bpm 120 bpm -- 109 bpm   Heart Rate (Exit) 84 bpm 80 bpm 82 bpm -- 76 bpm   Oxygen Saturation (Admit) 96 % 97 % 98 % -- 98 %   Oxygen Saturation (Exercise) 96 % 97 % 96 % -- 96 %   Oxygen Saturation (Exit) 98 % 97 % 98 % -- 97 %   Rating of Perceived Exertion (Exercise) '12 12 12 '$ -- 12   Perceived Dyspnea (Exercise) '12 12 12 '$ -- 12   Duration Continue with 30 min of aerobic exercise without signs/symptoms of physical distress. Continue with 30 min of aerobic exercise without signs/symptoms of physical distress. Continue with 30 min of aerobic exercise without signs/symptoms of physical distress. -- Continue with 30 min of aerobic exercise without signs/symptoms of physical distress.   Intensity THRR unchanged THRR unchanged THRR unchanged -- THRR unchanged     Progression   Progression Continue to progress  workloads to maintain intensity without signs/symptoms of physical distress. Continue to progress workloads to maintain intensity without signs/symptoms of physical distress. Continue to progress workloads to maintain intensity without signs/symptoms of physical distress. -- Continue to progress workloads to maintain intensity without signs/symptoms of physical distress.     Resistance Training   Training Prescription Yes Yes Yes -- Yes   Weight  4 lbs 8 lbs 8 lbs -- 8 lbs   Reps 10-15 10-15 10-15 -- 10-15   Time 10 Minutes 10 Minutes 10 Minutes -- 10 Minutes     Treadmill   MPH 1.5 1.7 1.8 -- 1.9   Grade 0 0 2.37 -- 0   Minutes '17 17 22 '$ -- 22   METs 2.15 2.6 2.37 -- 2.45     Recumbant Elliptical   Level '1 3 3 '$ -- 3   RPM 62 74 81 -- 81   Minutes '22 22 17 '$ -- 17   METs 3.6 4.5 5 -- 4.9     Home Exercise Plan   Plans to continue exercise at -- -- -- Home (comment) --   Frequency -- -- -- Add 3 additional days to program exercise sessions. --   Initial Home Exercises Provided -- -- -- 12/24/20 --    [place] Name 01/14/21 1107 02/02/21 1200           Response to Exercise   Blood Pressure (Admit) 100/52 102/60      Blood Pressure (Exercise) 148/70 162/84      Blood Pressure (Exit) 88/52 102/62      Heart Rate (Admit) 75 bpm 82 bpm      Heart Rate (Exercise) 122 bpm 129 bpm      Heart Rate (Exit) 92 bpm 94 bpm      Oxygen Saturation (Admit) 99 % 98 %      Oxygen Saturation (Exercise) 98 % 97 %      Oxygen Saturation (Exit) 98 % 98 %      Rating of Perceived Exertion (Exercise) 12 12      Perceived Dyspnea (Exercise) 12 12      Duration Continue with 30 min of aerobic exercise without signs/symptoms of physical distress. Continue with 30 min of aerobic exercise without signs/symptoms of physical distress.      Intensity THRR unchanged THRR unchanged             Progression   Progression Continue to progress workloads to maintain intensity without signs/symptoms of physical distress.  Continue to progress workloads to maintain intensity without signs/symptoms of physical distress.             Resistance Training   Training Prescription Yes Yes      Weight 8 lbs 8 lbs      Reps 10-15 10-15      Time 10 Minutes 10 Minutes             Treadmill   MPH 2 2.1      Grade 0 1      Minutes 22 22      METs 2.53 2.9             Recumbant Elliptical   Level 3 4      RPM 83 82      Minutes 17 17      METs 5 5               Exercise Comments:   Exercise Comments     Row Name 11/10/20 1200 12/24/20 1303         Exercise Comments Patient completed first exercise session. He tolerated exercise well with no complaints. He is eager to reach his goals in the program. His vitals were normal with exercise. He is very nice and very eager to come back to rehab. home exercise reviewed  Exercise Goals and Review:   Exercise Goals     Row Name 11/05/20 1345 11/17/20 1314 12/08/20 1447 01/05/21 1010 02/02/21 1211     Exercise Goals   Increase Physical Activity Yes Yes Yes Yes Yes   Intervention Provide advice, education, support and counseling about physical activity/exercise needs.;Develop an individualized exercise prescription for aerobic and resistive training based on initial evaluation findings, risk stratification, comorbidities and participant's personal goals. Provide advice, education, support and counseling about physical activity/exercise needs.;Develop an individualized exercise prescription for aerobic and resistive training based on initial evaluation findings, risk stratification, comorbidities and participant's personal goals. Provide advice, education, support and counseling about physical activity/exercise needs.;Develop an individualized exercise prescription for aerobic and resistive training based on initial evaluation findings, risk stratification, comorbidities and participant's personal goals. Provide advice, education, support and  counseling about physical activity/exercise needs.;Develop an individualized exercise prescription for aerobic and resistive training based on initial evaluation findings, risk stratification, comorbidities and participant's personal goals. Provide advice, education, support and counseling about physical activity/exercise needs.;Develop an individualized exercise prescription for aerobic and resistive training based on initial evaluation findings, risk stratification, comorbidities and participant's personal goals.   Expected Outcomes Short Term: Attend rehab on a regular basis to increase amount of physical activity.;Long Term: Add in home exercise to make exercise part of routine and to increase amount of physical activity.;Long Term: Exercising regularly at least 3-5 days a week. Short Term: Attend rehab on a regular basis to increase amount of physical activity.;Long Term: Add in home exercise to make exercise part of routine and to increase amount of physical activity.;Long Term: Exercising regularly at least 3-5 days a week. Short Term: Attend rehab on a regular basis to increase amount of physical activity.;Long Term: Add in home exercise to make exercise part of routine and to increase amount of physical activity.;Long Term: Exercising regularly at least 3-5 days a week. Short Term: Attend rehab on a regular basis to increase amount of physical activity.;Long Term: Add in home exercise to make exercise part of routine and to increase amount of physical activity.;Long Term: Exercising regularly at least 3-5 days a week. Short Term: Attend rehab on a regular basis to increase amount of physical activity.;Long Term: Add in home exercise to make exercise part of routine and to increase amount of physical activity.;Long Term: Exercising regularly at least 3-5 days a week.   Increase Strength and Stamina Yes Yes Yes Yes Yes   Intervention Provide advice, education, support and counseling about physical  activity/exercise needs.;Develop an individualized exercise prescription for aerobic and resistive training based on initial evaluation findings, risk stratification, comorbidities and participant's personal goals. Provide advice, education, support and counseling about physical activity/exercise needs.;Develop an individualized exercise prescription for aerobic and resistive training based on initial evaluation findings, risk stratification, comorbidities and participant's personal goals. Provide advice, education, support and counseling about physical activity/exercise needs.;Develop an individualized exercise prescription for aerobic and resistive training based on initial evaluation findings, risk stratification, comorbidities and participant's personal goals. Provide advice, education, support and counseling about physical activity/exercise needs.;Develop an individualized exercise prescription for aerobic and resistive training based on initial evaluation findings, risk stratification, comorbidities and participant's personal goals. Provide advice, education, support and counseling about physical activity/exercise needs.;Develop an individualized exercise prescription for aerobic and resistive training based on initial evaluation findings, risk stratification, comorbidities and participant's personal goals.   Expected Outcomes Short Term: Increase workloads from initial exercise prescription for resistance, speed, and METs.;Short Term: Perform resistance training exercises  routinely during rehab and add in resistance training at home;Long Term: Improve cardiorespiratory fitness, muscular endurance and strength as measured by increased METs and functional capacity (6MWT) Short Term: Increase workloads from initial exercise prescription for resistance, speed, and METs.;Short Term: Perform resistance training exercises routinely during rehab and add in resistance training at home;Long Term: Improve  cardiorespiratory fitness, muscular endurance and strength as measured by increased METs and functional capacity (6MWT) Short Term: Increase workloads from initial exercise prescription for resistance, speed, and METs.;Short Term: Perform resistance training exercises routinely during rehab and add in resistance training at home;Long Term: Improve cardiorespiratory fitness, muscular endurance and strength as measured by increased METs and functional capacity (6MWT) Short Term: Increase workloads from initial exercise prescription for resistance, speed, and METs.;Short Term: Perform resistance training exercises routinely during rehab and add in resistance training at home;Long Term: Improve cardiorespiratory fitness, muscular endurance and strength as measured by increased METs and functional capacity (6MWT) Short Term: Increase workloads from initial exercise prescription for resistance, speed, and METs.;Short Term: Perform resistance training exercises routinely during rehab and add in resistance training at home;Long Term: Improve cardiorespiratory fitness, muscular endurance and strength as measured by increased METs and functional capacity (6MWT)   Able to understand and use rate of perceived exertion (RPE) scale Yes Yes Yes Yes Yes   Intervention Provide education and explanation on how to use RPE scale Provide education and explanation on how to use RPE scale Provide education and explanation on how to use RPE scale Provide education and explanation on how to use RPE scale Provide education and explanation on how to use RPE scale   Expected Outcomes Short Term: Able to use RPE daily in rehab to express subjective intensity level;Long Term:  Able to use RPE to guide intensity level when exercising independently Short Term: Able to use RPE daily in rehab to express subjective intensity level;Long Term:  Able to use RPE to guide intensity level when exercising independently Short Term: Able to use RPE daily  in rehab to express subjective intensity level;Long Term:  Able to use RPE to guide intensity level when exercising independently Short Term: Able to use RPE daily in rehab to express subjective intensity level;Long Term:  Able to use RPE to guide intensity level when exercising independently Short Term: Able to use RPE daily in rehab to express subjective intensity level;Long Term:  Able to use RPE to guide intensity level when exercising independently   Able to understand and use Dyspnea scale Yes Yes Yes Yes Yes   Intervention Provide education and explanation on how to use Dyspnea scale Provide education and explanation on how to use Dyspnea scale Provide education and explanation on how to use Dyspnea scale Provide education and explanation on how to use Dyspnea scale Provide education and explanation on how to use Dyspnea scale   Expected Outcomes Short Term: Able to use Dyspnea scale daily in rehab to express subjective sense of shortness of breath during exertion;Long Term: Able to use Dyspnea scale to guide intensity level when exercising independently Short Term: Able to use Dyspnea scale daily in rehab to express subjective sense of shortness of breath during exertion;Long Term: Able to use Dyspnea scale to guide intensity level when exercising independently Short Term: Able to use Dyspnea scale daily in rehab to express subjective sense of shortness of breath during exertion;Long Term: Able to use Dyspnea scale to guide intensity level when exercising independently Short Term: Able to use Dyspnea scale daily in rehab to  express subjective sense of shortness of breath during exertion;Long Term: Able to use Dyspnea scale to guide intensity level when exercising independently Short Term: Able to use Dyspnea scale daily in rehab to express subjective sense of shortness of breath during exertion;Long Term: Able to use Dyspnea scale to guide intensity level when exercising independently   Knowledge and  understanding of Target Heart Rate Range (THRR) Yes Yes Yes Yes Yes   Intervention Provide education and explanation of THRR including how the numbers were predicted and where they are located for reference Provide education and explanation of THRR including how the numbers were predicted and where they are located for reference Provide education and explanation of THRR including how the numbers were predicted and where they are located for reference Provide education and explanation of THRR including how the numbers were predicted and where they are located for reference Provide education and explanation of THRR including how the numbers were predicted and where they are located for reference   Expected Outcomes Short Term: Able to state/look up THRR;Long Term: Able to use THRR to govern intensity when exercising independently;Short Term: Able to use daily as guideline for intensity in rehab Short Term: Able to state/look up THRR;Long Term: Able to use THRR to govern intensity when exercising independently;Short Term: Able to use daily as guideline for intensity in rehab Short Term: Able to state/look up THRR;Long Term: Able to use THRR to govern intensity when exercising independently;Short Term: Able to use daily as guideline for intensity in rehab Short Term: Able to state/look up THRR;Long Term: Able to use THRR to govern intensity when exercising independently;Short Term: Able to use daily as guideline for intensity in rehab Short Term: Able to state/look up THRR;Long Term: Able to use THRR to govern intensity when exercising independently;Short Term: Able to use daily as guideline for intensity in rehab   Understanding of Exercise Prescription Yes Yes Yes Yes Yes   Intervention Provide education, explanation, and written materials on patient's individual exercise prescription Provide education, explanation, and written materials on patient's individual exercise prescription Provide education, explanation,  and written materials on patient's individual exercise prescription Provide education, explanation, and written materials on patient's individual exercise prescription Provide education, explanation, and written materials on patient's individual exercise prescription   Expected Outcomes Short Term: Able to explain program exercise prescription;Long Term: Able to explain home exercise prescription to exercise independently Short Term: Able to explain program exercise prescription;Long Term: Able to explain home exercise prescription to exercise independently Short Term: Able to explain program exercise prescription;Long Term: Able to explain home exercise prescription to exercise independently Short Term: Able to explain program exercise prescription;Long Term: Able to explain home exercise prescription to exercise independently Short Term: Able to explain program exercise prescription;Long Term: Able to explain home exercise prescription to exercise independently            Exercise Goals Re-Evaluation :  Exercise Goals Re-Evaluation     [place] Name 11/17/20 1310 12/08/20 1447 01/05/21 1010 02/02/21 1212       Exercise Goal Re-Evaluation   Exercise Goals Review Increase Physical Activity;Increase Strength and Stamina;Able to understand and use rate of perceived exertion (RPE) scale;Able to understand and use Dyspnea scale;Knowledge and understanding of Target Heart Rate Range (THRR);Able to check pulse independently;Understanding of Exercise Prescription Increase Physical Activity;Increase Strength and Stamina;Able to understand and use rate of perceived exertion (RPE) scale;Able to understand and use Dyspnea scale;Knowledge and understanding of Target Heart Rate Range (THRR);Able to check pulse  independently;Understanding of Exercise Prescription Increase Physical Activity;Increase Strength and Stamina;Able to understand and use rate of perceived exertion (RPE) scale;Able to understand and use Dyspnea  scale;Knowledge and understanding of Target Heart Rate Range (THRR);Able to check pulse independently;Understanding of Exercise Prescription Increase Physical Activity;Increase Strength and Stamina;Able to understand and use rate of perceived exertion (RPE) scale;Able to understand and use Dyspnea scale;Knowledge and understanding of Target Heart Rate Range (THRR);Able to check pulse independently;Understanding of Exercise Prescription    Comments patient has completed 2 exercise sessions. He has tolerated exercise well with no complaints. He ia very eager to get healthier and decrease his shortness of breath. 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 He is excited to come back to rehab. He is currenlty exercising at 3.6 METs on the elliptical. Will continue to monitor and progress as able. Pt has completed 6 exercise sessions. He is progressing through the program and has been able to increase his workloads. He wants to be able to get back to his prev-COVID functioning. He continues to exercise at home as well. He is currently exercising at 4.5 METs on the stepper. Will continue to monitor and progress as able. Pt has completed 13 exercise sessions. He continues to progress and increase his workloads. He is walking several days a week at home outside of rehab. He is currently exercising at 4.9 METs on the elliptical. Will continue to monitor and progress as able. Pt has completed 19 exercise sessions. He continues to progress his workloads. He recently was able to return to work on a reduced schedule. He is currently exercising at 5 METs on the elliptical. Will continue to monitor and progress as able.    Expected Outcomes Through exercise at rehab and at home, patient will achieve their goals. Through exercise at rehab and at home, patient will achieve their goals. Through exercise at rehab and at home, patient will achieve their goals. Through exercise at rehab and at home,  patient will achieve their goals.             Discharge Exercise Prescription (Final Exercise Prescription Changes):  Exercise Prescription Changes - 02/02/21 1200       Response to Exercise   Blood Pressure (Admit) 102/60    Blood Pressure (Exercise) 162/84    Blood Pressure (Exit) 102/62    Heart Rate (Admit) 82 bpm    Heart Rate (Exercise) 129 bpm    Heart Rate (Exit) 94 bpm    Oxygen Saturation (Admit) 98 %    Oxygen Saturation (Exercise) 97 %    Oxygen Saturation (Exit) 98 %    Rating of Perceived Exertion (Exercise) 12    Perceived Dyspnea (Exercise) 12    Duration Continue with 30 min of aerobic exercise without signs/symptoms of physical distress.    Intensity THRR unchanged      Progression   Progression Continue to progress workloads to maintain intensity without signs/symptoms of physical distress.      Resistance Training   Training Prescription Yes    Weight 8 lbs    Reps 10-15    Time 10 Minutes      Treadmill   MPH 2.1    Grade 1    Minutes 22    METs 2.9      Recumbant Elliptical   Level 4    RPM 82    Minutes 17    METs 5  Nutrition:  Target Goals: Understanding of nutrition guidelines, daily intake of sodium '1500mg'$ , cholesterol '200mg'$ , calories 30% from fat and 7% or less from saturated fats, daily to have 5 or more servings of fruits and vegetables.  Biometrics:  Pre Biometrics - 11/17/20 1312       Pre Biometrics   Weight 90.9 kg    BMI (Calculated) 25.05              Nutrition Therapy Plan and Nutrition Goals:  Nutrition Therapy & Goals - 01/26/21 1153       Personal Nutrition Goals   Comments Nutritional hand out given to patient on how to choose healthy diet. Will continue to provide nutritional education through handouts and discussion.      Intervention Plan   Intervention Nutrition handout(s) given to patient.             Nutrition Assessments:  MEDIFICTS Score Key: ?70 Need to make  dietary changes  40-70 Heart Healthy Diet ? 40 Therapeutic Level Cholesterol Diet   Picture Your Plate Scores: [id number] Unhealthy dietary pattern with much room for improvement. 41-50 Dietary pattern unlikely to meet recommendations for good health and room for improvement. 51-60 More healthful dietary pattern, with some room for improvement.  >60 Healthy dietary pattern, although there may be some specific behaviors that could be improved.    Nutrition Goals Re-Evaluation:   Nutrition Goals Discharge (Final Nutrition Goals Re-Evaluation):   Psychosocial: Target Goals: Acknowledge presence or absence of significant depression and/or stress, maximize coping skills, provide positive support system. Participant is able to verbalize types and ability to use techniques and skills needed for reducing stress and depression.  Initial Review & Psychosocial Screening:  Initial Psych Review & Screening - 11/05/20 1239       Initial Review   Current issues with Current Sleep Concerns   Has trouble sleeping due to being on prednisone     Family Dynamics   Good Support System? Yes    Comments His wife is his main support system. He also has a mother, daughter, and son who support him.      Barriers   Psychosocial barriers to participate in program There are no identifiable barriers or psychosocial needs.      Screening Interventions   Interventions Encouraged to exercise    Expected Outcomes Long Term goal: The participant improves quality of Life and PHQ9 Scores as seen by post scores and/or verbalization of changes;Short Term goal: Identification and review with participant of any Quality of Life or Depression concerns found by scoring the questionnaire.             Quality of Life Scores:  Quality of Life - 11/05/20 1346       Quality of Life   Select Quality of Life      Quality of Life Scores   Health/Function Pre 16.88 %    Socioeconomic Pre 23.71 %    Psych/Spiritual Pre  27.43 %    Family Pre 27.6 %    GLOBAL Pre 21.89 %            Scores of 19 and below usually indicate a poorer quality of life in these areas.  A difference of  2-3 points is a clinically meaningful difference.  A difference of 2-3 points in the total score of the Quality of Life Index has been associated with significant improvement in overall quality of life, self-image, physical symptoms, and general health in studies assessing change in  quality of life.   PHQ-9: Recent Review Flowsheet Data     Depression screen [facility] 2/9 11/05/2020 09/25/2018 07/02/2018 07/02/2018   Decreased Interest 2 0 0 0   Down, Depressed, Hopeless 1 0 0 0   PHQ - 2 Score 3 0 0 0   Altered sleeping 3 - - -   Tired, decreased energy 3 - - -   Change in appetite 1 - - -   Feeling bad or failure about yourself  0 - - -   Trouble concentrating 0 - - -   Moving slowly or fidgety/restless 0 - - -   Suicidal thoughts 0 - - -   PHQ-9 Score 10 - - -   Difficult doing work/chores Very difficult - - -      Interpretation of Total Score  Total Score Depression Severity:  1-4 = Minimal depression, 5-9 = Mild depression, 10-14 = Moderate depression, 15-19 = Moderately severe depression, 20-27 = Severe depression   Psychosocial Evaluation and Intervention:  Psychosocial Evaluation - 11/05/20 1347       Psychosocial Evaluation & Interventions   Interventions Encouraged to exercise with the program and follow exercise prescription    Comments Pt has no barriers to completing rehab. Pt has no identifiable psychosocial issues. He has been having some issues sleeping which he attributes to being on prednisone since February. He is beginning to taper his dosage down, so he is hopeful that this will improve his sleep. His PHQ-9 score was a 10. 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 He was previously very active, but now he is unable to do much without becoming fatigued. He copes well. His support  system is strong with his wife, children, and mother. He is hopeful that the pulmonary rehab program will help him get back to his previous physical functioning. His goal is to decrease his SOB and to gain his stamina back. He would like to go back to playing golf and would like to be strong enough to return to work in August. He is excited to start the program.    Expected Outcomes The patient will continue to not have any identifiable psychosocial issues.    Continue Psychosocial Services  No Follow up required             Psychosocial Re-Evaluation:  Psychosocial Re-Evaluation     [place] Name 11/09/20 1232 12/02/20 0856 12/30/20 1245 01/26/21 1150       Psychosocial Re-Evaluation   Current issues with Current Stress Concerns Current Sleep Concerns Current Sleep Concerns Current Sleep Concerns    Comments Patient is new to the program. He plans to start this week. His insomina is managed with Ambien with no other psychosocial issues identified. We will continue to monitor. Patient has completed 4 session with no psychosocial barriers identified. His insomnia continues to be managed with Ambien. Will continue to monitor. Patient has completed 12 session with no psychosocial barriers identified. His insomnia continues to be managed with Ambien. Patient seems to enjoy coming to [place] and demonstrates a very positive outlook and is interested in improving his health. 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 Will continue to monitor. Patient has completed 18 session with no psychosocial barriers identified. His insomnia continues to be managed with Ambien. Patient seems to enjoy coming to [place] and demonstrates a very positive outlook and is interested in improving his health. 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 He is doing well in the program with progression and cocsistent attendance.    Expected Outcomes Patient's sleep will continue to be managed  with no additional psychosocial issues identified. Patient's sleep will continue to be managed with no psychosocial barriers identified. Patient's sleep will continue to be managed with no psychosocial barriers identified. Patient's sleep will continue to be managed with no psychosocial barriers identified.    Interventions Stress management education;Encouraged to attend Pulmonary Rehabilitation for the exercise;Relaxation education Stress management education;Encouraged to attend Pulmonary Rehabilitation for the exercise;Relaxation education Stress management education;Encouraged to attend Pulmonary Rehabilitation for the exercise;Relaxation education Stress management education;Encouraged to attend Pulmonary Rehabilitation for the exercise;Relaxation education    Continue Psychosocial Services  No Follow up required No Follow up required No Follow up required No Follow up required         Initial Review   Source of Stress Concerns -- -- -- None Identified             Psychosocial Discharge (Final Psychosocial Re-Evaluation):  Psychosocial Re-Evaluation - 01/26/21 1150       Psychosocial Re-Evaluation   Current issues with Current Sleep Concerns    Comments Patient has completed 18 session with no psychosocial barriers identified. His insomnia continues to be managed with Ambien. Patient seems to enjoy coming to [place] and demonstrates a very positive outlook and is interested in improving his health. He is very interactive with staff and other patient's in his class. He is doing well in the program with progression and cocsistent attendance.    Expected Outcomes Patient's sleep will continue to be managed with no psychosocial barriers identified.    Interventions Stress management education;Encouraged to attend Pulmonary Rehabilitation for the exercise;Relaxation education    Continue Psychosocial Services  No Follow up required      Initial Review   Source of Stress Concerns None Identified               Education: Education Goals: Education classes will be provided on a weekly basis, covering required topics. Participant will state understanding/return demonstration of topics presented.  Learning Barriers/Preferences:  Learning Barriers/Preferences - 11/05/20 1245       Learning Barriers/Preferences   Learning Barriers None    Learning Preferences Skilled Demonstration             Education Topics: How Lungs Work and Diseases: - Discuss the anatomy of the lungs and diseases that can affect the lungs, such as COPD. Flowsheet Row PULMONARY REHAB OTHER RESPIRATORY from 01/21/2021 in [place]  Date 12/24/20  Educator DJ  Instruction Review Code 1- Verbalizes Understanding       Exercise: -Discuss the importance of exercise, FITT principles of exercise, normal and abnormal responses to exercise, and how to exercise safely.   Environmental Irritants: -Discuss types of environmental irritants and how to limit exposure to environmental irritants. Flowsheet Row PULMONARY REHAB OTHER RESPIRATORY from 01/21/2021 in [place]  Date 12/31/20  Educator DJ  Instruction Review Code 1- Verbalizes Understanding       Meds/Inhalers and oxygen: - Discuss respiratory medications, definition of an inhaler and oxygen, and the proper way to use an inhaler and oxygen. Flowsheet Row PULMONARY REHAB OTHER RESPIRATORY from 01/21/2021 in [place]  Date 01/07/21  Educator DF       Energy Saving Techniques: - Discuss methods to conserve energy and decrease shortness of breath when performing activities of daily living.  Flowsheet Row PULMONARY REHAB OTHER RESPIRATORY from 01/21/2021 in [place]  Date 01/14/21  Educator pb  Instruction Review Code 1- Verbalizes Understanding       Bronchial Hygiene / Breathing Techniques: - Discuss breathing mechanics, pursed-lip breathing  technique,  proper posture, effective ways to clear airways, and other functional breathing techniques Flowsheet Row PULMONARY REHAB OTHER RESPIRATORY from 01/21/2021 in [place]  Date 01/21/21  Educator Handout  Instruction Review Code 1- Research scientist (medical): - Provides group verbal and written instruction about the health risks of elevated stress, cause of high stress, and healthy ways to reduce stress.   Nutrition I: Fats: - Discuss the types of cholesterol, what cholesterol does to the body, and how cholesterol levels can be controlled.   Nutrition II: Labels: -Discuss the different components of food labels and how to read food labels.   Respiratory Infections: - Discuss the signs and symptoms of respiratory infections, ways to prevent respiratory infections, and the importance of seeking medical treatment when having a respiratory infection. Flowsheet Row PULMONARY REHAB OTHER RESPIRATORY from 01/21/2021 in [place]  Date 11/19/20  Educator DF  Instruction Review Code 2- Demonstrated Understanding       Stress I: Signs and Symptoms: - Discuss the causes of stress, how stress may lead to anxiety and depression, and ways to limit stress.   Stress II: Relaxation: -Discuss relaxation techniques to limit stress. Flowsheet Row PULMONARY REHAB OTHER RESPIRATORY from 01/21/2021 in [place]  Date 12/03/20  Educator DJ  Instruction Review Code 1- Verbalizes Understanding       Oxygen for Home/Travel: - Discuss how to prepare for travel when on oxygen and proper ways to transport and store oxygen to ensure safety. Flowsheet Row PULMONARY REHAB OTHER RESPIRATORY from 01/21/2021 in [place]  Date 12/10/20  Educator DJ  Instruction Review Code 1- Verbalizes Understanding       Knowledge Questionnaire Score:  Knowledge Questionnaire Score - 11/05/20  1246       Knowledge Questionnaire Score   Pre Score 16/18             Core Components/Risk Factors/Patient Goals at Admission:  Personal Goals and Risk Factors at Admission - 11/05/20 1246       Core Components/Risk Factors/Patient Goals on Admission    Weight Management Yes;Weight Maintenance;Weight Loss    Intervention Weight Management: Develop a combined nutrition and exercise program designed to reach desired caloric intake, while maintaining appropriate intake of nutrient and fiber, sodium and fats, and appropriate energy expenditure required for the weight goal.;Weight Management: Provide education and appropriate resources to help participant work on and attain dietary goals.;Weight Management/Obesity: Establish reasonable short term and long term weight goals.    Expected Outcomes Short Term: Continue to assess and modify interventions until short term weight is achieved;Long Term: Adherence to nutrition and physical activity/exercise program aimed toward attainment of established weight goal;Weight Maintenance: Understanding of the daily nutrition guidelines, which includes 25-35% calories from fat, 7% or less cal from saturated fats, less than '200mg'$  cholesterol, less than 1.5gm of sodium, & 5 or more servings of fruits and vegetables daily;Weight Loss: Understanding of general recommendations for a balanced deficit meal plan, which promotes 1-2 lb weight loss per week and includes a negative energy balance of [phone] kcal/d;Understanding recommendations for meals to include 15-35% energy as protein, 25-35% energy from fat, 35-60% energy from carbohydrates, less  than '200mg'$  of dietary cholesterol, 20-35 gm of total fiber daily;Understanding of distribution of calorie intake throughout the day with the consumption of 4-5 meals/snacks    Improve shortness of breath with ADL's Yes    Intervention Provide education, individualized exercise plan and daily activity instruction to help  decrease symptoms of SOB with activities of daily living.    Expected Outcomes Short Term: Improve cardiorespiratory fitness to achieve a reduction of symptoms when performing ADLs;Long Term: Be able to perform more ADLs without symptoms or delay the onset of symptoms             Core Components/Risk Factors/Patient Goals Review:   Goals and Risk Factor Review     Row Name 11/09/20 1234 12/02/20 0858 12/30/20 1247 01/26/21 1157       Core Components/Risk Factors/Patient Goals Review   Personal Goals Review Other Other Other --    Review Patient was referred to PR with DOE post COVID syndrome. He plans to start the program 11/10/20. His personal goals for the program are to decrease his SOB; increase his stamina; play golf again and be able to return to work in August. We will continue to monitor his progress as he works toward meeting these goals. Patient has completed 4 sessions losing 2 lbs since his initial visit. He is doing well in the program with consistent attendance. His O2 saturation averages 97-98% during exercise on RA. His blood pressure is controlled. His pulmonologist gave him a return to work date of 02/02/21. His personal goals for the program are to decrease SOB; increase his stamina; return to work in August and play golf again. We will continue to monitor his progress as he works towards meeting these goals. Patient has completed 12 sessions gaining 6 lbs since last 30 day reivew. He continues to do well in the program with progressions and consistent attendance. He works hard and puts forth great effort during sessions. His blood pressure is well controlled. His O2 saturation averages 96-97% during exercise on RA. He saw an MD at [facility] 12/16/20 with chronic epitaxis. MD discontinued topical nasal spray steriods and instructed him to return if the bleeding continues. His personal goals for the program are to decrease SOB; increase his stamina; play golf; and be able to return  to work in August. We will continue to monitor his progress as he works towards meeting these goals. Patient has completed 18 sessions  He continues to do well in the program with progressions and consistent attendance. He works hard and puts forth great effort during sessions. His with remains at 95.6kg.  His blood pressure is well controlled. His O2 saturation averages 97-98% during exercise on RA.  His personal goals for the program are to decrease SOB; increase his stamina; play golf; and be able to return to work in August. We will continue to monitor his progress as he works towards meeting these goals.    Expected Outcomes Patient will complete the program meeting both program and personal goals. Patient will complete the program meeting both program and personal goals. Patient will complete the program meeting both program and personal goals. Patient will complete the program meeting both program and personal goals.             Core Components/Risk Factors/Patient Goals at Discharge (Final Review):   Goals and Risk Factor Review - 01/26/21 1157       Core Components/Risk Factors/Patient Goals Review   Review Patient has completed 18 sessions  He continues to do well in the program with progressions and consistent attendance. He works hard and puts forth great effort during sessions. His with remains at 95.6kg.  His blood pressure is well controlled. His O2 saturation averages 97-98% during exercise on RA.  His personal goals for the program are to decrease SOB; increase his stamina; play golf; and be able to return to work in August. We will continue to monitor his progress as he works towards meeting these goals.    Expected Outcomes Patient will complete the program meeting both program and personal goals.             ITP Comments:   Comments: ITP REVIEW Pt is making expected progress toward pulmonary rehab goals after completing 20 sessions. Recommend continued exercise, life  style modification, education, and utilization of breathing techniques to increase stamina and strength and decrease shortness of breath with exertion.

## 2021-02-04 ENCOUNTER — Encounter (HOSPITAL_COMMUNITY)
Admission: RE | Admit: 2021-02-04 | Discharge: 2021-02-04 | Disposition: A | Discharge status: Home / Self Care | Payer: 59 | Source: Ambulatory Visit | Attending: Pulmonary Disease | Admitting: Pulmonary Disease

## 2021-02-04 ENCOUNTER — Other Ambulatory Visit: Payer: Self-pay

## 2021-02-04 DIAGNOSIS — R0600 Dyspnea, unspecified: Secondary | ICD-10-CM

## 2021-02-04 DIAGNOSIS — U099 Post covid-19 condition, unspecified: Secondary | ICD-10-CM

## 2021-02-04 DIAGNOSIS — R0609 Other forms of dyspnea: Secondary | ICD-10-CM

## 2021-02-04 NOTE — Progress Notes (Signed)
Daily Session Note  Patient Details  Name: Marvin Munoz MRN: [id number] Date of Birth: 10/11/1960 Referring Provider:   [place] from 11/05/2020 in [place]  Referring Provider Dr. Vaughan Browner       Encounter Date: 02/04/2021  Check In:  Session Check In - 02/04/21 1045       Check-In   Supervising physician immediately available to respond to emergencies [facility] MD immediately available    Physician(s) Dr. Domenic Polite    Location [facility]    Staff Present Aundra Dubin, RN, BSN   Benay Pillow, RN   Virtual Visit No    Medication changes reported     No    Fall or balance concerns reported    No    Tobacco Cessation No Change    Warm-up and Cool-down Performed as group-led instruction    Resistance Training Performed Yes    VAD Patient? No    PAD/SET Patient? No      Pain Assessment   Currently in Pain? No/denies    Multiple Pain Sites No             Capillary Blood Glucose: No results found for this or any previous visit (from the past 24 hour(s)).    Social History   Tobacco Use  Smoking Status Never  Smokeless Tobacco Never    Goals Met:  Proper associated with RPD/PD & O2 Sat Independence with exercise equipment Using PLB without cueing & demonstrates good technique Exercise tolerated well No report of cardiac concerns or symptoms Strength training completed today  Goals Unmet:  Not Applicable  Comments: Check out 1145.   Dr. Kathie Dike is Medical Director for [facility] Pulmonary Rehab.

## 2021-02-05 ENCOUNTER — Telehealth: Payer: Self-pay | Admitting: Pulmonary Disease

## 2021-02-05 NOTE — Telephone Encounter (Signed)
Disability paperwork and 01/18/21 ov notes faxed to [facility] on 8/15 - fax# [phone].  Letter from Dr. Vaughan Browner faxed on 01/18/2021.

## 2021-02-09 ENCOUNTER — Encounter (HOSPITAL_COMMUNITY)
Admission: RE | Admit: 2021-02-09 | Discharge: 2021-02-09 | Disposition: A | Discharge status: Home / Self Care | Payer: 59 | Source: Ambulatory Visit | Attending: Pulmonary Disease | Admitting: Pulmonary Disease

## 2021-02-09 ENCOUNTER — Other Ambulatory Visit: Payer: Self-pay

## 2021-02-09 DIAGNOSIS — U099 Post covid-19 condition, unspecified: Secondary | ICD-10-CM

## 2021-02-09 DIAGNOSIS — R0609 Other forms of dyspnea: Secondary | ICD-10-CM

## 2021-02-09 DIAGNOSIS — R0600 Dyspnea, unspecified: Secondary | ICD-10-CM

## 2021-02-09 NOTE — Progress Notes (Signed)
Daily Session Note  Patient Details  Name: EMRIK ERHARD MRN: [id number] Date of Birth: 11/19/1960 Referring Provider:   [place] from 11/05/2020 in [place]  Referring Provider Dr. Vaughan Browner       Encounter Date: 02/09/2021  Check In:  Session Check In - 02/09/21 1045       Check-In   Supervising physician immediately available to respond to emergencies [facility] MD immediately available    Physician(s) Dr. Harl Bowie    Location [facility]    Staff Present Geanie Cooley, RN;Dalton Kris Mouton, MS, ACSM-CEP, Exercise Physiologist    Virtual Visit No    Medication changes reported     No    Fall or balance concerns reported    No    Tobacco Cessation No Change    Warm-up and Cool-down Performed as group-led instruction    Resistance Training Performed Yes    VAD Patient? No    PAD/SET Patient? No      Pain Assessment   Currently in Pain? No/denies    Multiple Pain Sites No             Capillary Blood Glucose: No results found for this or any previous visit (from the past 24 hour(s)).    Social History   Tobacco Use  Smoking Status Never  Smokeless Tobacco Never    Goals Met:  Proper associated with RPD/PD & O2 Sat Independence with exercise equipment Improved SOB with ADL's Exercise tolerated well No report of cardiac concerns or symptoms Strength training completed today  Goals Unmet:  Not Applicable  Comments: check out @11 :45am   Dr. Kathie Dike is Medical Director for [facility] Pulmonary Rehab.

## 2021-02-11 ENCOUNTER — Encounter (HOSPITAL_COMMUNITY)
Admission: RE | Admit: 2021-02-11 | Discharge: 2021-02-11 | Disposition: A | Discharge status: Home / Self Care | Payer: 59 | Source: Ambulatory Visit | Attending: Pulmonary Disease | Admitting: Pulmonary Disease

## 2021-02-11 ENCOUNTER — Other Ambulatory Visit: Payer: Self-pay

## 2021-02-11 DIAGNOSIS — R0609 Other forms of dyspnea: Secondary | ICD-10-CM

## 2021-02-11 DIAGNOSIS — R0600 Dyspnea, unspecified: Secondary | ICD-10-CM | POA: Diagnosis not present

## 2021-02-11 DIAGNOSIS — U099 Post covid-19 condition, unspecified: Secondary | ICD-10-CM

## 2021-02-11 NOTE — Progress Notes (Signed)
Daily Session Note  Patient Details  Name: Marvin Munoz MRN: [id number] Date of Birth: 1961-03-20 Referring Provider:   [place] from 11/05/2020 in [place]  Referring Provider Dr. Vaughan Browner       Encounter Date: 02/11/2021  Check In:  Session Check In - 02/11/21 1045       Check-In   Supervising physician immediately available to respond to emergencies [facility] MD immediately available    Physician(s) Dr. Harrington Challenger    Location [facility]    Staff Present Aundra Dubin, RN, BSN;Other    Virtual Visit No    Medication changes reported     No    Fall or balance concerns reported    No    Tobacco Cessation No Change    Warm-up and Cool-down Performed as group-led instruction    Resistance Training Performed Yes    VAD Patient? No    PAD/SET Patient? No      Pain Assessment   Currently in Pain? No/denies    Multiple Pain Sites No             Capillary Blood Glucose: No results found for this or any previous visit (from the past 24 hour(s)).    Social History   Tobacco Use  Smoking Status Never  Smokeless Tobacco Never    Goals Met:  Proper associated with RPD/PD & O2 Sat Independence with exercise equipment Using PLB without cueing & demonstrates good technique Exercise tolerated well No report of cardiac concerns or symptoms Strength training completed today  Goals Unmet:  Not Applicable  Comments: Check out 1145.   Dr. Kathie Dike is Medical Director for [facility] Pulmonary Rehab.

## 2021-02-16 ENCOUNTER — Encounter (HOSPITAL_COMMUNITY)
Admission: RE | Admit: 2021-02-16 | Discharge: 2021-02-16 | Disposition: A | Discharge status: Home / Self Care | Payer: 59 | Source: Ambulatory Visit | Attending: Pulmonary Disease | Admitting: Pulmonary Disease

## 2021-02-16 ENCOUNTER — Other Ambulatory Visit: Payer: Self-pay

## 2021-02-16 VITALS — Wt 210.5 lb

## 2021-02-16 DIAGNOSIS — R0609 Other forms of dyspnea: Secondary | ICD-10-CM

## 2021-02-16 DIAGNOSIS — U099 Post covid-19 condition, unspecified: Secondary | ICD-10-CM

## 2021-02-16 DIAGNOSIS — R0600 Dyspnea, unspecified: Secondary | ICD-10-CM | POA: Diagnosis not present

## 2021-02-16 NOTE — Progress Notes (Signed)
Daily Session Note  Patient Details  Name: Marvin Munoz MRN: [id number] Date of Birth: 07-Oct-1960 Referring Provider:   Flowsheet Row PULMONARY REHAB OTHER RESP ORIENTATION from 11/05/2020 in [place]  Referring Provider Dr. Vaughan Browner       Encounter Date: 02/16/2021  Check In:  Session Check In - 02/16/21 1045       Check-In   Supervising physician immediately available to respond to emergencies [facility] MD immediately available    Physician(s) Dr. Harl Bowie    Location [facility]    Staff Present Hoy Register, MS, ACSM-CEP, Exercise Physiologist;Other    Virtual Visit No    Medication changes reported     No    Fall or balance concerns reported    No    Tobacco Cessation No Change    Warm-up and Cool-down Performed as group-led instruction    Resistance Training Performed Yes    VAD Patient? No    PAD/SET Patient? No      Pain Assessment   Currently in Pain? No/denies    Multiple Pain Sites No             Capillary Blood Glucose: No results found for this or any previous visit (from the past 24 hour(s)).    Social History   Tobacco Use  Smoking Status Never  Smokeless Tobacco Never    Goals Met:  Independence with exercise equipment Exercise tolerated well No report of concerns or symptoms today Strength training completed today  Goals Unmet:  Not Applicable  Comments: checkout time is 1145   Dr. Kathie Dike is Medical Director for [facility] Pulmonary Rehab.

## 2021-02-18 ENCOUNTER — Encounter (HOSPITAL_COMMUNITY): Payer: 59

## 2021-02-23 ENCOUNTER — Encounter (HOSPITAL_COMMUNITY)
Admission: RE | Admit: 2021-02-23 | Discharge: 2021-02-23 | Disposition: A | Discharge status: Home / Self Care | Payer: 59 | Source: Ambulatory Visit | Attending: Pulmonary Disease | Admitting: Pulmonary Disease

## 2021-02-23 ENCOUNTER — Other Ambulatory Visit: Payer: Self-pay

## 2021-02-23 DIAGNOSIS — U099 Post covid-19 condition, unspecified: Secondary | ICD-10-CM | POA: Diagnosis present

## 2021-02-23 DIAGNOSIS — R0609 Other forms of dyspnea: Secondary | ICD-10-CM

## 2021-02-23 DIAGNOSIS — R0600 Dyspnea, unspecified: Secondary | ICD-10-CM | POA: Insufficient documentation

## 2021-02-23 NOTE — Progress Notes (Signed)
Daily Session Note  Patient Details  Name: Marvin Munoz MRN: [id number] Date of Birth: 1961-03-13 Referring Provider:   [place] from 11/05/2020 in [place]  Referring Provider Dr. Vaughan Browner       Encounter Date: 02/23/2021  Check In:  Session Check In - 02/23/21 1045       Check-In   Supervising physician immediately available to respond to emergencies [facility] MD immediately available    Physician(s) Dr. Harrington Challenger    Location [facility]    Staff Present Geanie Cooley, RN;Sharnetta Gielow, MS, ACSM-CEP, Exercise Physiologist    Virtual Visit No    Medication changes reported     No    Fall or balance concerns reported    No    Tobacco Cessation No Change    Warm-up and Cool-down Performed as group-led instruction    Resistance Training Performed Yes    VAD Patient? No    PAD/SET Patient? No      Pain Assessment   Currently in Pain? No/denies    Multiple Pain Sites No             Capillary Blood Glucose: No results found for this or any previous visit (from the past 24 hour(s)).    Social History   Tobacco Use  Smoking Status Never  Smokeless Tobacco Never    Goals Met:  Independence with exercise equipment Exercise tolerated well No report of concerns or symptoms today Strength training completed today  Goals Unmet:  Not Applicable  Comments: checkout time is 1145   Dr. Kathie Dike is Medical Director for [facility] Pulmonary Rehab.

## 2021-02-25 ENCOUNTER — Other Ambulatory Visit: Payer: Self-pay

## 2021-02-25 ENCOUNTER — Encounter (HOSPITAL_COMMUNITY)
Admission: RE | Admit: 2021-02-25 | Discharge: 2021-02-25 | Disposition: A | Discharge status: Home / Self Care | Payer: 59 | Source: Ambulatory Visit | Attending: Pulmonary Disease | Admitting: Pulmonary Disease

## 2021-02-25 DIAGNOSIS — R0600 Dyspnea, unspecified: Secondary | ICD-10-CM

## 2021-02-25 DIAGNOSIS — U099 Post covid-19 condition, unspecified: Secondary | ICD-10-CM

## 2021-02-25 DIAGNOSIS — R0609 Other forms of dyspnea: Secondary | ICD-10-CM

## 2021-02-25 NOTE — Progress Notes (Signed)
Daily Session Note  Patient Details  Name: Marvin Munoz MRN: [id number] Date of Birth: [birth year] Referring Provider:   [place] from 11/05/2020 in [place]  Referring Provider Dr. Vaughan Browner       Encounter Date: 02/25/2021  Check In:  Session Check In - 02/25/21 1045       Check-In   Supervising physician immediately available to respond to emergencies [facility] MD immediately available    Physician(s) Dr. Harl Bowie    Location [facility]    Staff Present Geanie Cooley, Kermit Balo, RN, BSN    Virtual Visit No    Medication changes reported     No    Fall or balance concerns reported    No    Tobacco Cessation No Change    Warm-up and Cool-down Performed as group-led instruction    Resistance Training Performed Yes    VAD Patient? No    PAD/SET Patient? No      Pain Assessment   Currently in Pain? No/denies    Multiple Pain Sites No             Capillary Blood Glucose: No results found for this or any previous visit (from the past 24 hour(s)).    Social History   Tobacco Use  Smoking Status Never  Smokeless Tobacco Never    Goals Met:  Proper associated with RPD/PD & O2 Sat Independence with exercise equipment Improved SOB with ADL's Exercise tolerated well No report of concerns or symptoms today Strength training completed today  Goals Unmet:  Not Applicable  Comments: check out @ 11:45am   Dr. Kathie Dike is Medical Director for [facility] Pulmonary Rehab.

## 2021-03-02 ENCOUNTER — Encounter (HOSPITAL_COMMUNITY): Payer: 59

## 2021-03-03 NOTE — Progress Notes (Signed)
Pulmonary Individual Treatment Plan  Patient Details  Name: Marvin Munoz MRN: [id number] Date of Birth: Oct 07, 1960 Referring Provider:   [facility] from 11/05/2020 in [place]  Referring Provider Dr. Vaughan Browner       Initial Encounter Date:  Flowsheet Row PULMONARY REHAB OTHER RESP ORIENTATION from 11/05/2020 in [place]  Date 11/05/20       Visit Diagnosis: DOE (dyspnea on exertion)  Post-COVID-19 syndrome  Dyspnea on exertion  Patient's Home Medications on Admission:   Current Outpatient Medications:    allopurinol (ZYLOPRIM) 300 MG tablet, Take 150 mg by mouth every evening. , Disp: , Rfl:    aspirin EC 81 MG tablet, Take 81 mg by mouth daily., Disp: , Rfl:    baclofen (LIORESAL) 10 MG tablet, Take 1 tablet (10 mg total) by mouth 3 (three) times daily. As needed for muscle spasm, Disp: 50 tablet, Rfl: 0   Calcium Carb-Cholecalciferol (CALCIUM + D3 PO), Take 1 tablet by mouth daily. , Disp: , Rfl:    cetirizine (ZYRTEC) 10 MG tablet, Take 10 mg by mouth every morning. , Disp: , Rfl:    diltiazem (CARDIZEM CD) 180 MG 24 hr capsule, Take 180 mg by mouth daily., Disp: , Rfl:    FIBER PO, Take 1 capsule by mouth daily., Disp: , Rfl:    fluticasone furoate-vilanterol (BREO ELLIPTA) 200-25 MCG/INH AEPB, Inhale 1 puff into the lungs daily., Disp: 60 each, Rfl: 6   Multiple Vitamin (MULTIVITAMIN) tablet, Take 1 tablet by mouth daily., Disp: , Rfl:    omeprazole (PRILOSEC) 40 MG capsule, Take 40 mg by mouth every morning. , Disp: , Rfl:    predniSONE (DELTASONE) 10 MG tablet, Take 4 tablets (40 mg total) by mouth daily with breakfast., Disp: 120 tablet, Rfl: 2   rizatriptan (MAXALT-MLT) 10 MG disintegrating tablet, Take 10 mg by mouth as needed for migraine. May repeat in 2 hours if needed, Disp: , Rfl:    rOPINIRole (REQUIP) 1 MG tablet, Take 2 mg by mouth daily., Disp: , Rfl:    rosuvastatin  (CRESTOR) 5 MG tablet, TAKE (1) TABLET BY MOUTH ONCE DAILY., Disp: 30 tablet, Rfl: 10   sulfamethoxazole-trimethoprim (BACTRIM DS) 800-160 MG tablet, Take 1 tablet by mouth 3 (three) times a week., Disp: 12 tablet, Rfl: 2   valACYclovir (VALTREX) 1000 MG tablet, Take 1,000 mg by mouth daily as needed (for cold sores). , Disp: , Rfl:    zolpidem (AMBIEN) 10 MG tablet, Take 10 mg by mouth as needed., Disp: , Rfl:   Past Medical History: Past Medical History:  Diagnosis Date   Allergic rhinitis    Eczema    Fever blister    occasional   Foot drop, right 09/24/2014   Gastroesophageal reflux    Gout    05-09-2018 per pt last flare up, 2014   Guillain Barr syndrome ([place]) 1970's   per pt received swine flu vaccination and regular flu vaccination at same time, was told one or the other may have caused guillain barre   HTN (hypertension)    Migraine headache    Mild aortic valve stenosis cardiologist-  dr Daneen Schick   per last echo  02-01-2017   moderately thickended and calcificed leaflets with mild AS and mild AR,  valve area 2.43cm^2   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 Tamala Julian--- per last CTA 02-21-2018 , 92m    Tobacco Use: Social History   Tobacco Use  Smoking Status Never  Smokeless Tobacco Never    Labs: Recent Review Flowsheet Data     Labs for ITP Cardiac and Pulmonary Rehab Latest Ref Rng & Units 06/18/2019   Cholestrol 100 - 199 mg/dL 153   LDLCALC 0 - 99 mg/dL 85   HDL >39 mg/dL 48   Trlycerides 0 - 149 mg/dL 108       Capillary Blood Glucose: No results found for:  GLUCAP   Pulmonary Assessment Scores:  Pulmonary Assessment Scores     Row Name 11/05/20 1247         ADL UCSD   SOB Score total 64           CAT Score   CAT Score 18           mMRC Score   mMRC Score 1             UCSD: Self-administered rating of dyspnea associated with activities of daily living (ADLs) 6-point scale (0 = "not at all" to 5 = "maximal or unable to do because of breathlessness")  Scoring Scores range from 0 to 120.  Minimally important difference is 5 units  CAT: CAT can identify the health impairment of COPD patients and is better correlated with disease progression.  CAT has a scoring range of zero to 40. The CAT score is classified into four groups of low (less than 10), medium (10 - 20), high (21-30) and very high (31-40) based on the impact level of disease on health status. A CAT score over 10 suggests significant symptoms.  A worsening CAT score could be explained by an exacerbation, poor medication adherence, poor inhaler technique, or progression of COPD or comorbid conditions.  CAT MCID is 2 points  mMRC: mMRC (Modified Medical Research Council) Dyspnea Scale is used to assess the degree of baseline functional disability in patients of respiratory disease due to dyspnea. No minimal important difference is established. A decrease in score of 1 point or greater is considered a positive change.   Pulmonary Function Assessment:   Exercise Target Goals: Exercise Program Goal: Individual exercise prescription set using results from initial 6 min walk test and THRR while considering  patient's activity barriers and safety.   Exercise Prescription Goal: Initial exercise prescription builds to 30-45 minutes a day of aerobic activity, 2-3 days per week.  Home exercise guidelines will be given to patient during program as part of exercise prescription that the participant will acknowledge.  Activity Barriers & Risk Stratification:  Activity Barriers  & Cardiac Risk Stratification - 11/05/20 1237       Activity Barriers & Cardiac Risk Stratification   Activity Barriers Arthritis;Back Problems;Left Knee Replacement;Deconditioning;Shortness of Breath    Cardiac Risk Stratification Moderate             6 Minute Walk:  6 Minute Walk     Row Name 11/05/20 1342         6 Minute Walk   Phase Initial     Distance 1550 feet  Walk Time 6 minutes     # of Rest Breaks 0     MPH 2.94     METS 4.2     RPE 12     Perceived Dyspnea  13     VO2 Peak 14.69     Symptoms No     Resting HR 83 bpm     Resting BP 110/70     Resting Oxygen Saturation  97 %     Exercise Oxygen Saturation  during 6 min walk 94 %     Max Ex. HR 95 bpm     Max Ex. BP 134/68     2 Minute Post BP 104/70           Interval HR   1 Minute HR 92     2 Minute HR 92     3 Minute HR 88     4 Minute HR 92     5 Minute HR 92     6 Minute HR 95     2 Minute Post HR 81     Interval Heart Rate? Yes           Interval Oxygen   Interval Oxygen? Yes     Baseline Oxygen Saturation % 97 %     1 Minute Oxygen Saturation % 95 %     1 Minute Liters of Oxygen 0 L     2 Minute Oxygen Saturation % 96 %     2 Minute Liters of Oxygen 0 L     3 Minute Oxygen Saturation % 94 %     3 Minute Liters of Oxygen 0 L     4 Minute Oxygen Saturation % 96 %     4 Minute Liters of Oxygen 0 L     5 Minute Oxygen Saturation % 96 %     5 Minute Liters of Oxygen 0 L     6 Minute Oxygen Saturation % 98 %     6 Minute Liters of Oxygen 0 L     2 Minute Post Oxygen Saturation % 98 %     2 Minute Post Liters of Oxygen 0 L              Oxygen Initial Assessment:  Oxygen Initial Assessment - 11/05/20 1342       Initial 6 min Walk   Oxygen Used None      Program Oxygen Prescription   Program Oxygen Prescription None      Intervention   Short Term Goals To learn and exhibit compliance with exercise, home and travel O2 prescription;To learn and understand importance of  monitoring SPO2 with pulse oximeter and demonstrate accurate use of the pulse oximeter.;To learn and understand importance of maintaining oxygen saturations>88%;To learn and demonstrate proper pursed lip breathing techniques or other breathing techniques. ;To learn and demonstrate proper use of respiratory medications    Long  Term Goals Exhibits compliance with exercise, home  and travel O2 prescription;Verbalizes importance of monitoring SPO2 with pulse oximeter and return demonstration;Maintenance of O2 saturations>88%;Exhibits proper breathing techniques, such as pursed lip breathing or other method taught during program session;Compliance with respiratory medication             Oxygen Re-Evaluation:  Oxygen Re-Evaluation     Row Name 11/17/20 1314 12/08/20 1447 01/05/21 1008 02/02/21 1209 03/02/21 1623     Program Oxygen Prescription   Program Oxygen Prescription None None None None None  Home Oxygen   Home Oxygen Device None None None None None   Sleep Oxygen Prescription None None None None None   Home Exercise Oxygen Prescription None None None None None   Home Resting Oxygen Prescription None None None None None   Compliance with Home Oxygen Use Yes Yes Yes Yes Yes     Goals/Expected Outcomes   Short Term Goals To learn and exhibit compliance with exercise, home and travel O2 prescription;To learn and understand importance of monitoring SPO2 with pulse oximeter and demonstrate accurate use of the pulse oximeter.;To learn and understand importance of maintaining oxygen saturations>88%;To learn and demonstrate proper pursed lip breathing techniques or other breathing techniques. ;To learn and demonstrate proper use of respiratory medications To learn and exhibit compliance with exercise, home and travel O2 prescription;To learn and understand importance of monitoring SPO2 with pulse oximeter and demonstrate accurate use of the pulse oximeter.;To learn and understand importance of  maintaining oxygen saturations>88%;To learn and demonstrate proper pursed lip breathing techniques or other breathing techniques. ;To learn and demonstrate proper use of respiratory medications To learn and exhibit compliance with exercise, home and travel O2 prescription;To learn and understand importance of monitoring SPO2 with pulse oximeter and demonstrate accurate use of the pulse oximeter.;To learn and understand importance of maintaining oxygen saturations>88%;To learn and demonstrate proper pursed lip breathing techniques or other breathing techniques. ;To learn and demonstrate proper use of respiratory medications To learn and exhibit compliance with exercise, home and travel O2 prescription;To learn and understand importance of monitoring SPO2 with pulse oximeter and demonstrate accurate use of the pulse oximeter.;To learn and understand importance of maintaining oxygen saturations>88%;To learn and demonstrate proper pursed lip breathing techniques or other breathing techniques. ;To learn and demonstrate proper use of respiratory medications To learn and exhibit compliance with exercise, home and travel O2 prescription;To learn and understand importance of monitoring SPO2 with pulse oximeter and demonstrate accurate use of the pulse oximeter.;To learn and understand importance of maintaining oxygen saturations>88%;To learn and demonstrate proper pursed lip breathing techniques or other breathing techniques. ;To learn and demonstrate proper use of respiratory medications   Long  Term Goals Exhibits compliance with exercise, home  and travel O2 prescription;Verbalizes importance of monitoring SPO2 with pulse oximeter and return demonstration;Maintenance of O2 saturations>88%;Exhibits proper breathing techniques, such as pursed lip breathing or other method taught during program session;Compliance with respiratory medication Exhibits compliance with exercise, home  and travel O2 prescription;Verbalizes  importance of monitoring SPO2 with pulse oximeter and return demonstration;Maintenance of O2 saturations>88%;Exhibits proper breathing techniques, such as pursed lip breathing or other method taught during program session;Compliance with respiratory medication Exhibits compliance with exercise, home  and travel O2 prescription;Verbalizes importance of monitoring SPO2 with pulse oximeter and return demonstration;Maintenance of O2 saturations>88%;Exhibits proper breathing techniques, such as pursed lip breathing or other method taught during program session;Compliance with respiratory medication Exhibits compliance with exercise, home  and travel O2 prescription;Verbalizes importance of monitoring SPO2 with pulse oximeter and return demonstration;Maintenance of O2 saturations>88%;Exhibits proper breathing techniques, such as pursed lip breathing or other method taught during program session;Compliance with respiratory medication Exhibits compliance with exercise, home  and travel O2 prescription;Verbalizes importance of monitoring SPO2 with pulse oximeter and return demonstration;Maintenance of O2 saturations>88%;Exhibits proper breathing techniques, such as pursed lip breathing or other method taught during program session;Compliance with respiratory medication   Goals/Expected Outcomes -- compliance compliance compliance compliance            Oxygen Discharge (Final Oxygen Re-Evaluation):  Oxygen Re-Evaluation - 03/02/21 1623       Program Oxygen Prescription   Program Oxygen Prescription None      Home Oxygen   Home Oxygen Device None    Sleep Oxygen Prescription None    Home Exercise Oxygen Prescription None    Home Resting Oxygen Prescription None    Compliance with Home Oxygen Use Yes      Goals/Expected Outcomes   Short Term Goals To learn and exhibit compliance with exercise, home and travel O2 prescription;To learn and understand importance of monitoring SPO2 with pulse oximeter and  demonstrate accurate use of the pulse oximeter.;To learn and understand importance of maintaining oxygen saturations>88%;To learn and demonstrate proper pursed lip breathing techniques or other breathing techniques. ;To learn and demonstrate proper use of respiratory medications    Long  Term Goals Exhibits compliance with exercise, home  and travel O2 prescription;Verbalizes importance of monitoring SPO2 with pulse oximeter and return demonstration;Maintenance of O2 saturations>88%;Exhibits proper breathing techniques, such as pursed lip breathing or other method taught during program session;Compliance with respiratory medication    Goals/Expected Outcomes compliance             Initial Exercise Prescription:  Initial Exercise Prescription - 11/05/20 1300       Date of Initial Exercise RX and Referring Provider   Date 11/05/20    Referring Provider Dr. Vaughan Browner    Expected Discharge Date 03/11/21      Treadmill   MPH 1.5    Grade 0    Minutes 17      Recumbant Elliptical   Level 1    RPM 60    Minutes 22      Prescription Details   Frequency (times per week) 2    Duration Progress to 30 minutes of continuous aerobic without signs/symptoms of physical distress      Intensity   THRR 40-80% of Max Heartrate 64-128    Ratings of Perceived Exertion 11-13    Perceived Dyspnea 0-4      Resistance Training   Training Prescription Yes    Weight 4 lbs    Reps 10-15             Perform Capillary Blood Glucose checks as needed.  Exercise Prescription Changes:   Exercise Prescription Changes     Row Name 11/17/20 1300 12/08/20 1400 12/22/20 1200 12/24/20 1300 12/31/20 1008     Response to Exercise   Blood Pressure (Admit) 1'20/68 96/52 90/52 '$ -- 106/60   Blood Pressure (Exercise) 146/72 106/70 170/76 -- 148/70   Blood Pressure (Exit) 118/72 106/72 86/60 -- 96/64   Heart Rate (Admit) 81 bpm 75 bpm 78 bpm -- 74 bpm   Heart Rate (Exercise) 99 bpm 96 bpm 120 bpm -- 109 bpm    Heart Rate (Exit) 84 bpm 80 bpm 82 bpm -- 76 bpm   Oxygen Saturation (Admit) 96 % 97 % 98 % -- 98 %   Oxygen Saturation (Exercise) 96 % 97 % 96 % -- 96 %   Oxygen Saturation (Exit) 98 % 97 % 98 % -- 97 %   Rating of Perceived Exertion (Exercise) '12 12 12 '$ -- 12   Perceived Dyspnea (Exercise) '12 12 12 '$ -- 12   Duration Continue with 30 min of aerobic exercise without signs/symptoms of physical distress. Continue with 30 min of aerobic exercise without signs/symptoms of physical distress. Continue with 30 min of aerobic exercise without signs/symptoms of physical distress. -- Continue with 30 min of  aerobic exercise without signs/symptoms of physical distress.   Intensity THRR unchanged THRR unchanged THRR unchanged -- THRR unchanged     Progression   Progression Continue to progress workloads to maintain intensity without signs/symptoms of physical distress. Continue to progress workloads to maintain intensity without signs/symptoms of physical distress. Continue to progress workloads to maintain intensity without signs/symptoms of physical distress. -- Continue to progress workloads to maintain intensity without signs/symptoms of physical distress.     Resistance Training   Training Prescription Yes Yes Yes -- Yes   Weight 4 lbs 8 lbs 8 lbs -- 8 lbs   Reps 10-15 10-15 10-15 -- 10-15   Time 10 Minutes 10 Minutes 10 Minutes -- 10 Minutes     Treadmill   MPH 1.5 1.7 1.8 -- 1.9   Grade 0 0 2.37 -- 0   Minutes '17 17 22 '$ -- 22   METs 2.15 2.6 2.37 -- 2.45     Recumbant Elliptical   Level '1 3 3 '$ -- 3   RPM 62 74 81 -- 81   Minutes '22 22 17 '$ -- 17   METs 3.6 4.5 5 -- 4.9     Home Exercise Plan   Plans to continue exercise at -- -- -- Home (comment) --   Frequency -- -- -- Add 3 additional days to program exercise sessions. --   Initial Home Exercises Provided -- -- -- 12/24/20 --    [place] Name 01/14/21 1107 02/02/21 1200 02/16/21 1100 02/25/21 1045       Response to Exercise   Blood  Pressure (Admit) 100/52 102/60 114/60 104/58    Blood Pressure (Exercise) 148/70 162/84 180/80 160/80    Blood Pressure (Exit) 88/52 1'02/62 96/60 90/60 '$    Heart Rate (Admit) 75 bpm 82 bpm 73 bpm 65 bpm    Heart Rate (Exercise) 122 bpm 129 bpm 115 bpm 117 bpm    Heart Rate (Exit) 92 bpm 94 bpm 78 bpm 82 bpm    Oxygen Saturation (Admit) 99 % 98 % 98 % 98 %    Oxygen Saturation (Exercise) 98 % 97 % 97 % 98 %    Oxygen Saturation (Exit) 98 % 98 % 97 % 98 %    Rating of Perceived Exertion (Exercise) '12 12 12 12    '$ Perceived Dyspnea (Exercise) '12 12 12 12    '$ Duration Continue with 30 min of aerobic exercise without signs/symptoms of physical distress. Continue with 30 min of aerobic exercise without signs/symptoms of physical distress. Continue with 30 min of aerobic exercise without signs/symptoms of physical distress. Continue with 30 min of aerobic exercise without signs/symptoms of physical distress.    Intensity THRR unchanged THRR unchanged THRR unchanged THRR unchanged         Progression   Progression Continue to progress workloads to maintain intensity without signs/symptoms of physical distress. Continue to progress workloads to maintain intensity without signs/symptoms of physical distress. Continue to progress workloads to maintain intensity without signs/symptoms of physical distress. Continue to progress workloads to maintain intensity without signs/symptoms of physical distress.         Resistance Training   Training Prescription Yes Yes Yes Yes    Weight 8 lbs 8 lbs 8 lbs 8 lbs    Reps 10-15 10-15 10-15 10-15    Time 10 Minutes 10 Minutes -- 10 Minutes         Treadmill   MPH 2 2.1 2.2 2.2    Grade 0 1 1 1  Minutes '22 22 22 22    '$ METs 2.53 2.9 2.97 2.97         Recumbant Elliptical   Level '3 4 4 4    '$ RPM 83 82 83 82    Minutes '17 17 17 17    '$ METs 5 5 5.1 4.8             Exercise Comments:   Exercise Comments     Row Name 11/10/20 1200 12/24/20 1303          Exercise Comments Patient completed first exercise session. He tolerated exercise well with no complaints. He is eager to reach his goals in the program. His vitals were normal with exercise. He is very nice and very eager to come back to rehab. home exercise reviewed               Exercise Goals and Review:   Exercise Goals     Row Name 11/05/20 1345 11/17/20 1314 12/08/20 1447 01/05/21 1010 02/02/21 1211     Exercise Goals   Increase Physical Activity Yes Yes Yes Yes Yes   Intervention Provide advice, education, support and counseling about physical activity/exercise needs.;Develop an individualized exercise prescription for aerobic and resistive training based on initial evaluation findings, risk stratification, comorbidities and participant's personal goals. Provide advice, education, support and counseling about physical activity/exercise needs.;Develop an individualized exercise prescription for aerobic and resistive training based on initial evaluation findings, risk stratification, comorbidities and participant's personal goals. Provide advice, education, support and counseling about physical activity/exercise needs.;Develop an individualized exercise prescription for aerobic and resistive training based on initial evaluation findings, risk stratification, comorbidities and participant's personal goals. Provide advice, education, support and counseling about physical activity/exercise needs.;Develop an individualized exercise prescription for aerobic and resistive training based on initial evaluation findings, risk stratification, comorbidities and participant's personal goals. Provide advice, education, support and counseling about physical activity/exercise needs.;Develop an individualized exercise prescription for aerobic and resistive training based on initial evaluation findings, risk stratification, comorbidities and participant's personal goals.   Expected Outcomes Short Term:  Attend rehab on a regular basis to increase amount of physical activity.;Long Term: Add in home exercise to make exercise part of routine and to increase amount of physical activity.;Long Term: Exercising regularly at least 3-5 days a week. Short Term: Attend rehab on a regular basis to increase amount of physical activity.;Long Term: Add in home exercise to make exercise part of routine and to increase amount of physical activity.;Long Term: Exercising regularly at least 3-5 days a week. Short Term: Attend rehab on a regular basis to increase amount of physical activity.;Long Term: Add in home exercise to make exercise part of routine and to increase amount of physical activity.;Long Term: Exercising regularly at least 3-5 days a week. Short Term: Attend rehab on a regular basis to increase amount of physical activity.;Long Term: Add in home exercise to make exercise part of routine and to increase amount of physical activity.;Long Term: Exercising regularly at least 3-5 days a week. Short Term: Attend rehab on a regular basis to increase amount of physical activity.;Long Term: Add in home exercise to make exercise part of routine and to increase amount of physical activity.;Long Term: Exercising regularly at least 3-5 days a week.   Increase Strength and Stamina Yes Yes Yes Yes Yes   Intervention Provide advice, education, support and counseling about physical activity/exercise needs.;Develop an individualized exercise prescription for aerobic and resistive training based on initial evaluation findings, risk stratification, comorbidities and  participant's personal goals. Provide advice, education, support and counseling about physical activity/exercise needs.;Develop an individualized exercise prescription for aerobic and resistive training based on initial evaluation findings, risk stratification, comorbidities and participant's personal goals. Provide advice, education, support and counseling about physical  activity/exercise needs.;Develop an individualized exercise prescription for aerobic and resistive training based on initial evaluation findings, risk stratification, comorbidities and participant's personal goals. Provide advice, education, support and counseling about physical activity/exercise needs.;Develop an individualized exercise prescription for aerobic and resistive training based on initial evaluation findings, risk stratification, comorbidities and participant's personal goals. Provide advice, education, support and counseling about physical activity/exercise needs.;Develop an individualized exercise prescription for aerobic and resistive training based on initial evaluation findings, risk stratification, comorbidities and participant's personal goals.   Expected Outcomes Short Term: Increase workloads from initial exercise prescription for resistance, speed, and METs.;Short Term: Perform resistance training exercises routinely during rehab and add in resistance training at home;Long Term: Improve cardiorespiratory fitness, muscular endurance and strength as measured by increased METs and functional capacity (6MWT) Short Term: Increase workloads from initial exercise prescription for resistance, speed, and METs.;Short Term: Perform resistance training exercises routinely during rehab and add in resistance training at home;Long Term: Improve cardiorespiratory fitness, muscular endurance and strength as measured by increased METs and functional capacity (6MWT) Short Term: Increase workloads from initial exercise prescription for resistance, speed, and METs.;Short Term: Perform resistance training exercises routinely during rehab and add in resistance training at home;Long Term: Improve cardiorespiratory fitness, muscular endurance and strength as measured by increased METs and functional capacity (6MWT) Short Term: Increase workloads from initial exercise prescription for resistance, speed, and  METs.;Short Term: Perform resistance training exercises routinely during rehab and add in resistance training at home;Long Term: Improve cardiorespiratory fitness, muscular endurance and strength as measured by increased METs and functional capacity (6MWT) Short Term: Increase workloads from initial exercise prescription for resistance, speed, and METs.;Short Term: Perform resistance training exercises routinely during rehab and add in resistance training at home;Long Term: Improve cardiorespiratory fitness, muscular endurance and strength as measured by increased METs and functional capacity (6MWT)   Able to understand and use rate of perceived exertion (RPE) scale Yes Yes Yes Yes Yes   Intervention Provide education and explanation on how to use RPE scale Provide education and explanation on how to use RPE scale Provide education and explanation on how to use RPE scale Provide education and explanation on how to use RPE scale Provide education and explanation on how to use RPE scale   Expected Outcomes Short Term: Able to use RPE daily in rehab to express subjective intensity level;Long Term:  Able to use RPE to guide intensity level when exercising independently Short Term: Able to use RPE daily in rehab to express subjective intensity level;Long Term:  Able to use RPE to guide intensity level when exercising independently Short Term: Able to use RPE daily in rehab to express subjective intensity level;Long Term:  Able to use RPE to guide intensity level when exercising independently Short Term: Able to use RPE daily in rehab to express subjective intensity level;Long Term:  Able to use RPE to guide intensity level when exercising independently Short Term: Able to use RPE daily in rehab to express subjective intensity level;Long Term:  Able to use RPE to guide intensity level when exercising independently   Able to understand and use Dyspnea scale Yes Yes Yes Yes Yes   Intervention Provide education and  explanation on how to use Dyspnea scale Provide education and explanation  on how to use Dyspnea scale Provide education and explanation on how to use Dyspnea scale Provide education and explanation on how to use Dyspnea scale Provide education and explanation on how to use Dyspnea scale   Expected Outcomes Short Term: Able to use Dyspnea scale daily in rehab to express subjective sense of shortness of breath during exertion;Long Term: Able to use Dyspnea scale to guide intensity level when exercising independently Short Term: Able to use Dyspnea scale daily in rehab to express subjective sense of shortness of breath during exertion;Long Term: Able to use Dyspnea scale to guide intensity level when exercising independently Short Term: Able to use Dyspnea scale daily in rehab to express subjective sense of shortness of breath during exertion;Long Term: Able to use Dyspnea scale to guide intensity level when exercising independently Short Term: Able to use Dyspnea scale daily in rehab to express subjective sense of shortness of breath during exertion;Long Term: Able to use Dyspnea scale to guide intensity level when exercising independently Short Term: Able to use Dyspnea scale daily in rehab to express subjective sense of shortness of breath during exertion;Long Term: Able to use Dyspnea scale to guide intensity level when exercising independently   Knowledge and understanding of Target Heart Rate Range (THRR) Yes Yes Yes Yes Yes   Intervention Provide education and explanation of THRR including how the numbers were predicted and where they are located for reference Provide education and explanation of THRR including how the numbers were predicted and where they are located for reference Provide education and explanation of THRR including how the numbers were predicted and where they are located for reference Provide education and explanation of THRR including how the numbers were predicted and where they are  located for reference Provide education and explanation of THRR including how the numbers were predicted and where they are located for reference   Expected Outcomes Short Term: Able to state/look up THRR;Long Term: Able to use THRR to govern intensity when exercising independently;Short Term: Able to use daily as guideline for intensity in rehab Short Term: Able to state/look up THRR;Long Term: Able to use THRR to govern intensity when exercising independently;Short Term: Able to use daily as guideline for intensity in rehab Short Term: Able to state/look up THRR;Long Term: Able to use THRR to govern intensity when exercising independently;Short Term: Able to use daily as guideline for intensity in rehab Short Term: Able to state/look up THRR;Long Term: Able to use THRR to govern intensity when exercising independently;Short Term: Able to use daily as guideline for intensity in rehab Short Term: Able to state/look up THRR;Long Term: Able to use THRR to govern intensity when exercising independently;Short Term: Able to use daily as guideline for intensity in rehab   Understanding of Exercise Prescription Yes Yes Yes Yes Yes   Intervention Provide education, explanation, and written materials on patient's individual exercise prescription Provide education, explanation, and written materials on patient's individual exercise prescription Provide education, explanation, and written materials on patient's individual exercise prescription Provide education, explanation, and written materials on patient's individual exercise prescription Provide education, explanation, and written materials on patient's individual exercise prescription   Expected Outcomes Short Term: Able to explain program exercise prescription;Long Term: Able to explain home exercise prescription to exercise independently Short Term: Able to explain program exercise prescription;Long Term: Able to explain home exercise prescription to exercise  independently Short Term: Able to explain program exercise prescription;Long Term: Able to explain home exercise prescription to exercise independently Short Term: Able  to explain program exercise prescription;Long Term: Able to explain home exercise prescription to exercise independently Short Term: Able to explain program exercise prescription;Long Term: Able to explain home exercise prescription to exercise independently    [place] Name 03/02/21 1625             Exercise Goals   Increase Physical Activity Yes       Intervention Provide advice, education, support and counseling about physical activity/exercise needs.;Develop an individualized exercise prescription for aerobic and resistive training based on initial evaluation findings, risk stratification, comorbidities and participant's personal goals.       Expected Outcomes Short Term: Attend rehab on a regular basis to increase amount of physical activity.;Long Term: Add in home exercise to make exercise part of routine and to increase amount of physical activity.;Long Term: Exercising regularly at least 3-5 days a week.       Increase Strength and Stamina Yes       Intervention Provide advice, education, support and counseling about physical activity/exercise needs.;Develop an individualized exercise prescription for aerobic and resistive training based on initial evaluation findings, risk stratification, comorbidities and participant's personal goals.       Expected Outcomes Short Term: Increase workloads from initial exercise prescription for resistance, speed, and METs.;Short Term: Perform resistance training exercises routinely during rehab and add in resistance training at home;Long Term: Improve cardiorespiratory fitness, muscular endurance and strength as measured by increased METs and functional capacity (6MWT)       Able to understand and use rate of perceived exertion (RPE) scale Yes       Intervention Provide education and explanation  on how to use RPE scale       Expected Outcomes Short Term: Able to use RPE daily in rehab to express subjective intensity level;Long Term:  Able to use RPE to guide intensity level when exercising independently       Able to understand and use Dyspnea scale Yes       Intervention Provide education and explanation on how to use Dyspnea scale       Expected Outcomes Short Term: Able to use Dyspnea scale daily in rehab to express subjective sense of shortness of breath during exertion;Long Term: Able to use Dyspnea scale to guide intensity level when exercising independently       Knowledge and understanding of Target Heart Rate Range (THRR) Yes       Intervention Provide education and explanation of THRR including how the numbers were predicted and where they are located for reference       Expected Outcomes Short Term: Able to state/look up THRR;Long Term: Able to use THRR to govern intensity when exercising independently;Short Term: Able to use daily as guideline for intensity in rehab       Understanding of Exercise Prescription Yes       Intervention Provide education, explanation, and written materials on patient's individual exercise prescription       Expected Outcomes Short Term: Able to explain program exercise prescription;Long Term: Able to explain home exercise prescription to exercise independently                Exercise Goals Re-Evaluation :  Exercise Goals Re-Evaluation     [place] Name 11/17/20 1310 12/08/20 1447 01/05/21 1010 02/02/21 1212 03/02/21 1625     Exercise Goal Re-Evaluation   Exercise Goals Review Increase Physical Activity;Increase Strength and Stamina;Able to understand and use rate of perceived exertion (RPE) scale;Able to understand and use Dyspnea scale;Knowledge and  understanding of Target Heart Rate Range (THRR);Able to check pulse independently;Understanding of Exercise Prescription Increase Physical Activity;Increase Strength and Stamina;Able to understand and  use rate of perceived exertion (RPE) scale;Able to understand and use Dyspnea scale;Knowledge and understanding of Target Heart Rate Range (THRR);Able to check pulse independently;Understanding of Exercise Prescription Increase Physical Activity;Increase Strength and Stamina;Able to understand and use rate of perceived exertion (RPE) scale;Able to understand and use Dyspnea scale;Knowledge and understanding of Target Heart Rate Range (THRR);Able to check pulse independently;Understanding of Exercise Prescription Increase Physical Activity;Increase Strength and Stamina;Able to understand and use rate of perceived exertion (RPE) scale;Able to understand and use Dyspnea scale;Knowledge and understanding of Target Heart Rate Range (THRR);Able to check pulse independently;Understanding of Exercise Prescription Increase Physical Activity;Increase Strength and Stamina;Able to understand and use rate of perceived exertion (RPE) scale;Able to understand and use Dyspnea scale;Knowledge and understanding of Target Heart Rate Range (THRR);Able to check pulse independently;Understanding of Exercise Prescription   Comments patient has completed 2 exercise sessions. He has tolerated exercise well with no complaints. He ia very eager to get healthier and decrease his shortness of breath. 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 He is excited to come back to rehab. He is currenlty exercising at 3.6 METs on the elliptical. Will continue to monitor and progress as able. Pt has completed 6 exercise sessions. He is progressing through the program and has been able to increase his workloads. He wants to be able to get back to his prev-COVID functioning. He continues to exercise at home as well. He is currently exercising at 4.5 METs on the stepper. Will continue to monitor and progress as able. Pt has completed 13 exercise sessions. He continues to progress and increase his workloads. He is walking  several days a week at home outside of rehab. He is currently exercising at 4.9 METs on the elliptical. Will continue to monitor and progress as able. Pt has completed 19 exercise sessions. He continues to progress his workloads. He recently was able to return to work on a reduced schedule. He is currently exercising at 5 METs on the elliptical. Will continue to monitor and progress as able. Pt has completed 25 exercise sessions. He continues to progress in the program. He has returned to work and reports that he has been able to tolerate all of the walking that his job requires. He is currently exercising at 4.8 METs. Will continue to monitor and progress as able.   Expected Outcomes Through exercise at rehab and at home, patient will achieve their goals. Through exercise at rehab and at home, patient will achieve their goals. Through exercise at rehab and at home, patient will achieve their goals. Through exercise at rehab and at home, patient will achieve their goals. Through exercise at rehab and at home, patient will achieve their goals.            Discharge Exercise Prescription (Final Exercise Prescription Changes):  Exercise Prescription Changes - 02/25/21 1045       Response to Exercise   Blood Pressure (Admit) 104/58    Blood Pressure (Exercise) 160/80    Blood Pressure (Exit) 90/60    Heart Rate (Admit) 65 bpm    Heart Rate (Exercise) 117 bpm    Heart Rate (Exit) 82 bpm    Oxygen Saturation (Admit) 98 %    Oxygen Saturation (Exercise) 98 %    Oxygen Saturation (Exit) 98 %    Rating of  Perceived Exertion (Exercise) 12    Perceived Dyspnea (Exercise) 12    Duration Continue with 30 min of aerobic exercise without signs/symptoms of physical distress.    Intensity THRR unchanged      Progression   Progression Continue to progress workloads to maintain intensity without signs/symptoms of physical distress.      Resistance Training   Training Prescription Yes    Weight 8 lbs     Reps 10-15    Time 10 Minutes      Treadmill   MPH 2.2    Grade 1    Minutes 22    METs 2.97      Recumbant Elliptical   Level 4    RPM 82    Minutes 17    METs 4.8             Nutrition:  Target Goals: Understanding of nutrition guidelines, daily intake of sodium '1500mg'$ , cholesterol '200mg'$ , calories 30% from fat and 7% or less from saturated fats, daily to have 5 or more servings of fruits and vegetables.  Biometrics:  Pre Biometrics - 11/17/20 1312       Pre Biometrics   Weight 90.9 kg    BMI (Calculated) 25.05              Nutrition Therapy Plan and Nutrition Goals:  Nutrition Therapy & Goals - 01/26/21 1153       Personal Nutrition Goals   Comments Nutritional hand out given to patient on how to choose healthy diet. Will continue to provide nutritional education through handouts and discussion.      Intervention Plan   Intervention Nutrition handout(s) given to patient.             Nutrition Assessments:  MEDIFICTS Score Key: ?70 Need to make dietary changes  40-70 Heart Healthy Diet ? 40 Therapeutic Level Cholesterol Diet   Picture Your Plate Scores: [id number] Unhealthy dietary pattern with much room for improvement. 41-50 Dietary pattern unlikely to meet recommendations for good health and room for improvement. 51-60 More healthful dietary pattern, with some room for improvement.  >60 Healthy dietary pattern, although there may be some specific behaviors that could be improved.    Nutrition Goals Re-Evaluation:   Nutrition Goals Discharge (Final Nutrition Goals Re-Evaluation):   Psychosocial: Target Goals: Acknowledge presence or absence of significant depression and/or stress, maximize coping skills, provide positive support system. Participant is able to verbalize types and ability to use techniques and skills needed for reducing stress and depression.  Initial Review & Psychosocial Screening:  Initial Psych Review & Screening -  11/05/20 1239       Initial Review   Current issues with Current Sleep Concerns   Has trouble sleeping due to being on prednisone     Family Dynamics   Good Support System? Yes    Comments His wife is his main support system. He also has a mother, daughter, and son who support him.      Barriers   Psychosocial barriers to participate in program There are no identifiable barriers or psychosocial needs.      Screening Interventions   Interventions Encouraged to exercise    Expected Outcomes Long Term goal: The participant improves quality of Life and PHQ9 Scores as seen by post scores and/or verbalization of changes;Short Term goal: Identification and review with participant of any Quality of Life or Depression concerns found by scoring the questionnaire.  Quality of Life Scores:  Quality of Life - 11/05/20 1346       Quality of Life   Select Quality of Life      Quality of Life Scores   Health/Function Pre 16.88 %    Socioeconomic Pre 23.71 %    Psych/Spiritual Pre 27.43 %    Family Pre 27.6 %    GLOBAL Pre 21.89 %            Scores of 19 and below usually indicate a poorer quality of life in these areas.  A difference of  2-3 points is a clinically meaningful difference.  A difference of 2-3 points in the total score of the Quality of Life Index has been associated with significant improvement in overall quality of life, self-image, physical symptoms, and general health in studies assessing change in quality of life.   PHQ-9: Recent Review Flowsheet Data     Depression screen [facility] 2/9 11/05/2020 09/25/2018 07/02/2018 07/02/2018   Decreased Interest 2 0 0 0   Down, Depressed, Hopeless 1 0 0 0   PHQ - 2 Score 3 0 0 0   Altered sleeping 3 - - -   Tired, decreased energy 3 - - -   Change in appetite 1 - - -   Feeling bad or failure about yourself  0 - - -   Trouble concentrating 0 - - -   Moving slowly or fidgety/restless 0 - - -   Suicidal thoughts 0 - - -    PHQ-9 Score 10 - - -   Difficult doing work/chores Very difficult - - -      Interpretation of Total Score  Total Score Depression Severity:  1-4 = Minimal depression, 5-9 = Mild depression, 10-14 = Moderate depression, 15-19 = Moderately severe depression, 20-27 = Severe depression   Psychosocial Evaluation and Intervention:  Psychosocial Evaluation - 11/05/20 1347       Psychosocial Evaluation & Interventions   Interventions Encouraged to exercise with the program and follow exercise prescription    Comments Pt has no barriers to completing rehab. Pt has no identifiable psychosocial issues. He has been having some issues sleeping which he attributes to being on prednisone since February. He is beginning to taper his dosage down, so he is hopeful that this will improve his sleep. His PHQ-9 score was a 10. 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 He was previously very active, but now he is unable to do much without becoming fatigued. He copes well. His support system is strong with his wife, children, and mother. He is hopeful that the pulmonary rehab program will help him get back to his previous physical functioning. His goal is to decrease his SOB and to gain his stamina back. He would like to go back to playing golf and would like to be strong enough to return to work in August. He is excited to start the program.    Expected Outcomes The patient will continue to not have any identifiable psychosocial issues.    Continue Psychosocial Services  No Follow up required             Psychosocial Re-Evaluation:  Psychosocial Re-Evaluation     [place] Name 11/09/20 1232 12/02/20 0856 12/30/20 1245 01/26/21 1150 02/25/21 0937     Psychosocial Re-Evaluation   Current issues with Current Stress Concerns Current Sleep Concerns Current Sleep Concerns Current Sleep Concerns Current Sleep Concerns   Comments Patient is new  to the program. He plans to start this week. His  insomina is managed with Ambien with no other psychosocial issues identified. We will continue to monitor. Patient has completed 4 session with no psychosocial barriers identified. His insomnia continues to be managed with Ambien. Will continue to monitor. Patient has completed 12 session with no psychosocial barriers identified. His insomnia continues to be managed with Ambien. Patient seems to enjoy coming to [place] and demonstrates a very positive outlook and is interested in improving his health. 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 Will continue to monitor. Patient has completed 18 session with no psychosocial barriers identified. His insomnia continues to be managed with Ambien. Patient seems to enjoy coming to [place] and demonstrates a very positive outlook and is interested in improving his health. He is very interactive with staff and other patient's in his class. He is doing well in the program with progression and cocsistent attendance. Patient has completed 24 session with no psychosocial barriers identified. His insomnia continues to be managed with Ambien. Patient seems to enjoy coming to [place] and demonstrates a very positive outlook and is interested in improving his health. He is very interactive with staff and other patient's in his class. We will continue to monitor.   Expected Outcomes Patient's sleep will continue to be managed with no additional psychosocial issues identified. Patient's sleep will continue to be managed with no psychosocial barriers identified. Patient's sleep will continue to be managed with no psychosocial barriers identified. Patient's sleep will continue to be managed with no psychosocial barriers identified. Patient's sleep will continue to be managed with no psychosocial barriers identified.   Interventions Stress management education;Encouraged to attend Pulmonary Rehabilitation for the exercise;Relaxation education Stress management  education;Encouraged to attend Pulmonary Rehabilitation for the exercise;Relaxation education Stress management education;Encouraged to attend Pulmonary Rehabilitation for the exercise;Relaxation education Stress management education;Encouraged to attend Pulmonary Rehabilitation for the exercise;Relaxation education Stress management education;Encouraged to attend Pulmonary Rehabilitation for the exercise;Relaxation education   Continue Psychosocial Services  No Follow up required No Follow up required No Follow up required No Follow up required No Follow up required     Initial Review   Source of Stress Concerns -- -- -- None Identified None Identified            Psychosocial Discharge (Final Psychosocial Re-Evaluation):  Psychosocial Re-Evaluation - 02/25/21 0937       Psychosocial Re-Evaluation   Current issues with Current Sleep Concerns    Comments Patient has completed 24 session with no psychosocial barriers identified. His insomnia continues to be managed with Ambien. Patient seems to enjoy coming to [place] and demonstrates a very positive outlook and is interested in improving his health. He is very interactive with staff and other patient's in his class. We will continue to monitor.    Expected Outcomes Patient's sleep will continue to be managed with no psychosocial barriers identified.    Interventions Stress management education;Encouraged to attend Pulmonary Rehabilitation for the exercise;Relaxation education    Continue Psychosocial Services  No Follow up required      Initial Review   Source of Stress Concerns None Identified              Education: Education Goals: Education classes will be provided on a weekly basis, covering required topics. Participant will state understanding/return demonstration of topics presented.  Learning Barriers/Preferences:  Learning Barriers/Preferences - 11/05/20 1245       Learning Barriers/Preferences   Learning  Barriers None     Learning Preferences Skilled Demonstration             Education Topics: How Lungs Work and Diseases: - Discuss the anatomy of the lungs and diseases that can affect the lungs, such as COPD. Flowsheet Row PULMONARY REHAB OTHER RESPIRATORY from 02/11/2021 in [place]  Date 12/24/20  Educator DJ  Instruction Review Code 1- Verbalizes Understanding       Exercise: -Discuss the importance of exercise, FITT principles of exercise, normal and abnormal responses to exercise, and how to exercise safely.   Environmental Irritants: -Discuss types of environmental irritants and how to limit exposure to environmental irritants. Flowsheet Row PULMONARY REHAB OTHER RESPIRATORY from 02/11/2021 in [place]  Date 12/31/20  Educator DJ  Instruction Review Code 1- Verbalizes Understanding       Meds/Inhalers and oxygen: - Discuss respiratory medications, definition of an inhaler and oxygen, and the proper way to use an inhaler and oxygen. Flowsheet Row PULMONARY REHAB OTHER RESPIRATORY from 02/11/2021 in [place]  Date 01/07/21  Educator DF       Energy Saving Techniques: - Discuss methods to conserve energy and decrease shortness of breath when performing activities of daily living.  Flowsheet Row PULMONARY REHAB OTHER RESPIRATORY from 02/11/2021 in [place]  Date 01/14/21  Educator pb  Instruction Review Code 1- Verbalizes Understanding       Bronchial Hygiene / Breathing Techniques: - Discuss breathing mechanics, pursed-lip breathing technique,  proper posture, effective ways to clear airways, and other functional breathing techniques Flowsheet Row PULMONARY REHAB OTHER RESPIRATORY from 02/11/2021 in [place]  Date 01/21/21  Educator Handout  Instruction Review Code 1- Research scientist (medical): - Provides group verbal and  written instruction about the health risks of elevated stress, cause of high stress, and healthy ways to reduce stress.   Nutrition I: Fats: - Discuss the types of cholesterol, what cholesterol does to the body, and how cholesterol levels can be controlled. Flowsheet Row PULMONARY REHAB OTHER RESPIRATORY from 02/11/2021 in [place]  Date 02/04/21  Educator DJ  Instruction Review Code 1- Verbalizes Understanding       Nutrition II: Labels: -Discuss the different components of food labels and how to read food labels. Flowsheet Row PULMONARY REHAB OTHER RESPIRATORY from 02/11/2021 in [place]  Date 02/11/21  Educator DJ  Instruction Review Code 1- Verbalizes Understanding       Respiratory Infections: - Discuss the signs and symptoms of respiratory infections, ways to prevent respiratory infections, and the importance of seeking medical treatment when having a respiratory infection. Flowsheet Row PULMONARY REHAB OTHER RESPIRATORY from 02/11/2021 in [place]  Date 11/19/20  Educator DF  Instruction Review Code 2- Demonstrated Understanding       Stress I: Signs and Symptoms: - Discuss the causes of stress, how stress may lead to anxiety and depression, and ways to limit stress. Flowsheet Row PULMONARY REHAB OTHER RESPIRATORY from 02/25/2021 in [place]  Date 02/25/21  Educator pb  Instruction Review Code 1- Verbalizes Understanding       Stress II: Relaxation: -Discuss relaxation techniques to limit stress. Flowsheet Row PULMONARY REHAB OTHER RESPIRATORY from 02/11/2021 in [place]  Date 12/03/20  Educator DJ  Instruction Review Code 1- Verbalizes Understanding       Oxygen for Home/Travel: - Discuss how  to prepare for travel when on oxygen and proper ways to transport and store oxygen to ensure safety. Flowsheet Row PULMONARY REHAB OTHER  RESPIRATORY from 02/11/2021 in [place]  Date 12/10/20  Educator DJ  Instruction Review Code 1- Verbalizes Understanding       Knowledge Questionnaire Score:  Knowledge Questionnaire Score - 11/05/20 1246       Knowledge Questionnaire Score   Pre Score 16/18             Core Components/Risk Factors/Patient Goals at Admission:  Personal Goals and Risk Factors at Admission - 11/05/20 1246       Core Components/Risk Factors/Patient Goals on Admission    Weight Management Yes;Weight Maintenance;Weight Loss    Intervention Weight Management: Develop a combined nutrition and exercise program designed to reach desired caloric intake, while maintaining appropriate intake of nutrient and fiber, sodium and fats, and appropriate energy expenditure required for the weight goal.;Weight Management: Provide education and appropriate resources to help participant work on and attain dietary goals.;Weight Management/Obesity: Establish reasonable short term and long term weight goals.    Expected Outcomes Short Term: Continue to assess and modify interventions until short term weight is achieved;Long Term: Adherence to nutrition and physical activity/exercise program aimed toward attainment of established weight goal;Weight Maintenance: Understanding of the daily nutrition guidelines, which includes 25-35% calories from fat, 7% or less cal from saturated fats, less than '200mg'$  cholesterol, less than 1.5gm of sodium, & 5 or more servings of fruits and vegetables daily;Weight Loss: Understanding of general recommendations for a balanced deficit meal plan, which promotes 1-2 lb weight loss per week and includes a negative energy balance of [id number] kcal/d;Understanding recommendations for meals to include 15-35% energy as protein, 25-35% energy from fat, 35-60% energy from carbohydrates, less than '200mg'$  of dietary cholesterol, 20-35 gm of total fiber daily;Understanding of distribution  of calorie intake throughout the day with the consumption of 4-5 meals/snacks    Improve shortness of breath with ADL's Yes    Intervention Provide education, individualized exercise plan and daily activity instruction to help decrease symptoms of SOB with activities of daily living.    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 He plans to start the program 11/10/20. His personal goals for the program are to decrease his SOB; increase his stamina; play golf again and be able to return to work in August. We will continue to monitor his progress as he works toward meeting these goals. Patient has completed 4 sessions losing 2 lbs since his initial visit. He is doing well in the program with consistent attendance. His O2 saturation averages 97-98% during exercise on RA. His blood pressure is controlled. His pulmonologist gave him a return to work date of 02/02/21. His personal goals for the program are to decrease SOB; increase his stamina; return to work in August and play golf again. We will continue to monitor his progress as he works towards  meeting these goals. Patient has completed 12 sessions gaining 6 lbs since last 30 day reivew. He continues to do well in the program with progressions and consistent attendance. He works hard and puts forth great effort during sessions. His blood pressure is well controlled. His O2 saturation averages 96-97% during exercise on RA. He saw an MD at [facility] 12/16/20 with chronic epitaxis. MD discontinued topical nasal spray steriods and instructed him to return if the bleeding continues. His personal goals for the program are to decrease SOB; increase his stamina; play golf; and be able to return to work in August. We will continue to monitor his progress as he works towards meeting these goals. Patient has completed 18 sessions  He continues to do well in the program with progressions and consistent attendance. He works hard and puts forth great effort during sessions. His with remains at 95.6kg.  His blood pressure is well controlled. His O2 saturation averages 97-98% during exercise on RA.  His personal goals for the program are to decrease SOB; increase his stamina; play golf; and be able to return to work in August. We will continue to monitor his progress as he works towards meeting these goals. Patient has completed 24 sessions  He continues to do well in the program with progressions and consistent attendance. He works hard and puts forth great effort during sessions. He has maintained his weight since lase 30 day review.  His blood pressure is well controlled. His O2 saturation averages continues to average 97-98% during exercise on RA.  His personal goals for the program continue to be to decrease SOB; increase his stamina; play golf; and be able to return to work in August. We will continue to monitor his progress as he works towards meeting these goals.   Expected Outcomes Patient will complete the program meeting both program and personal goals. Patient will complete the program meeting both program and personal goals. Patient will complete the program meeting both program and personal goals. Patient will complete the program meeting both program and personal goals. Patient will complete the program meeting both program and personal goals.            Core Components/Risk Factors/Patient Goals at Discharge (Final Review):   Goals and Risk  Factor Review - 02/25/21 0938       Core Components/Risk Factors/Patient Goals Review   Personal Goals Review Other    Review Patient has completed 24 sessions  He continues to do well in the program with progressions and consistent attendance. He works hard and puts forth great effort during sessions. He has maintained his weight since lase 30 day review.  His blood pressure is well controlled. His O2 saturation averages continues to average 97-98% during exercise on RA.  His personal goals for the program continue to be to decrease SOB; increase his stamina; play golf; and be able to return to work in August. We will continue to monitor his progress as he works towards meeting these goals.    Expected Outcomes Patient will complete the program meeting both program and personal goals.             ITP Comments:   Comments: ITP REVIEW Pt is making expected progress toward pulmonary rehab goals after completing 26 sessions. Recommend continued exercise, life style modification, education, and utilization of breathing techniques to increase stamina and strength and decrease shortness of breath with exertion.

## 2021-03-04 ENCOUNTER — Encounter (HOSPITAL_COMMUNITY): Payer: 59

## 2021-03-09 ENCOUNTER — Other Ambulatory Visit: Payer: Self-pay

## 2021-03-09 ENCOUNTER — Encounter (HOSPITAL_COMMUNITY)
Admission: RE | Admit: 2021-03-09 | Discharge: 2021-03-09 | Disposition: A | Discharge status: Home / Self Care | Payer: 59 | Source: Ambulatory Visit | Attending: Pulmonary Disease | Admitting: Pulmonary Disease

## 2021-03-09 DIAGNOSIS — U099 Post covid-19 condition, unspecified: Secondary | ICD-10-CM

## 2021-03-09 DIAGNOSIS — R0609 Other forms of dyspnea: Secondary | ICD-10-CM

## 2021-03-09 DIAGNOSIS — R0600 Dyspnea, unspecified: Secondary | ICD-10-CM

## 2021-03-09 NOTE — Progress Notes (Signed)
Daily Session Note  Patient Details  Name: Marvin Munoz MRN: [id number] Date of Birth: November 28, 1960 Referring Provider:   Flowsheet Row PULMONARY REHAB OTHER RESP ORIENTATION from 11/05/2020 in [place]  Referring Provider Dr. Vaughan Browner       Encounter Date: 03/09/2021  Check In:  Session Check In - 03/09/21 1045       Check-In   Supervising physician immediately available to respond to emergencies [facility] MD immediately available    Physician(s) Dr. Domenic Polite    Location [facility]    Staff Present Redge Gainer, BS, Exercise Physiologist;Dalton Kris Mouton, MS, ACSM-CEP, Exercise Physiologist    Virtual Visit No    Medication changes reported     No    Fall or balance concerns reported    No    Tobacco Cessation No Change    Warm-up and Cool-down Performed as group-led instruction    Resistance Training Performed Yes    VAD Patient? No    PAD/SET Patient? No      Pain Assessment   Currently in Pain? No/denies    Multiple Pain Sites No             Capillary Blood Glucose: No results found for this or any previous visit (from the past 24 hour(s)).    Social History   Tobacco Use  Smoking Status Never  Smokeless Tobacco Never    Goals Met:  Independence with exercise equipment Exercise tolerated well No report of concerns or symptoms today Strength training completed today  Goals Unmet:  Not Applicable  Comments: check out 1145   Dr. Kathie Dike is Medical Director for [facility].

## 2021-03-11 ENCOUNTER — Encounter (HOSPITAL_COMMUNITY)
Admission: RE | Admit: 2021-03-11 | Discharge: 2021-03-11 | Disposition: A | Discharge status: Home / Self Care | Payer: 59 | Source: Ambulatory Visit | Attending: Pulmonary Disease | Admitting: Pulmonary Disease

## 2021-03-11 ENCOUNTER — Other Ambulatory Visit: Payer: Self-pay

## 2021-03-11 VITALS — Ht 75.0 in | Wt 209.9 lb

## 2021-03-11 DIAGNOSIS — R0609 Other forms of dyspnea: Secondary | ICD-10-CM

## 2021-03-11 DIAGNOSIS — R0600 Dyspnea, unspecified: Secondary | ICD-10-CM

## 2021-03-11 DIAGNOSIS — U099 Post covid-19 condition, unspecified: Secondary | ICD-10-CM

## 2021-03-11 NOTE — Progress Notes (Signed)
Daily Session Note  Patient Details  Name: Marvin Munoz MRN: [id number] Date of Birth: 1961-01-03 Referring Provider:   Flowsheet Row PULMONARY REHAB OTHER RESP ORIENTATION from 11/05/2020 in [place]  Referring Provider Dr. Vaughan Browner       Encounter Date: 03/11/2021  Check In:  Session Check In - 03/11/21 1045       Check-In   Supervising physician immediately available to respond to emergencies [facility] MD immediately available    Physician(s) Dr. Harl Bowie    Location [facility]    Staff Present Hoy Register, MS, ACSM-CEP, Exercise Physiologist;Rondia Higginbotham Zigmund Daniel, Exercise Physiologist    Virtual Visit No    Medication changes reported     No    Fall or balance concerns reported    No    Tobacco Cessation No Change    Warm-up and Cool-down Performed as group-led instruction    Resistance Training Performed Yes    VAD Patient? No    PAD/SET Patient? No      Pain Assessment   Currently in Pain? No/denies    Multiple Pain Sites No             Capillary Blood Glucose: No results found for this or any previous visit (from the past 24 hour(s)).    Social History   Tobacco Use  Smoking Status Never  Smokeless Tobacco Never    Goals Met:  Independence with exercise equipment Exercise tolerated well No report of concerns or symptoms today Strength training completed today  Goals Unmet:  Not Applicable  Comments: check out 1145   Dr. Kathie Dike is Medical Director for [facility].

## 2021-03-16 NOTE — Progress Notes (Signed)
Discharge Progress Report  Patient Details  Name: Marvin Munoz MRN: [id number] Date of Birth: 1961-03-17 Referring Provider:   [place] from 11/05/2020 in [place]  Referring Provider Dr. Vaughan Browner        Number of Visits: 28  Reason for Discharge:  Patient reached a stable level of exercise. Patient independent in their exercise. Patient has met program and personal goals.  Smoking History:  Social History   Tobacco Use  Smoking Status Never  Smokeless Tobacco Never    Diagnosis:  DOE (dyspnea on exertion)  Post-COVID-19 syndrome  Dyspnea on exertion  ADL [facility]:  Pulmonary Assessment Scores     Row Name 11/05/20 1247 03/16/21 0958       ADL [facility]   ADL Phase -- Exit    SOB Score total 64 33         CAT Score   CAT Score 18 10         mMRC Score   mMRC Score 1 0             Initial Exercise Prescription:  Initial Exercise Prescription - 11/05/20 1300       Date of Initial Exercise RX and Referring Provider   Date 11/05/20    Referring Provider Dr. Vaughan Browner    Expected Discharge Date 03/11/21      Treadmill   MPH 1.5    Grade 0    Minutes 17      Recumbant Elliptical   Level 1    RPM 60    Minutes 22      Prescription Details   Frequency (times per week) 2    Duration Progress to 30 minutes of continuous aerobic without signs/symptoms of physical distress      Intensity   THRR 40-80% of Max Heartrate 64-128    Ratings of Perceived Exertion 11-13    Perceived Dyspnea 0-4      Resistance Training   Training Prescription Yes    Weight 4 lbs    Reps 10-15             Discharge Exercise Prescription (Final Exercise Prescription Changes):  Exercise Prescription Changes - 02/25/21 1045       Response to Exercise   Blood Pressure (Admit) 104/58    Blood Pressure (Exercise) 160/80    Blood Pressure (Exit) 90/60    Heart Rate (Admit) 65 bpm    Heart Rate  (Exercise) 117 bpm    Heart Rate (Exit) 82 bpm    Oxygen Saturation (Admit) 98 %    Oxygen Saturation (Exercise) 98 %    Oxygen Saturation (Exit) 98 %    Rating of Perceived Exertion (Exercise) 12    Perceived Dyspnea (Exercise) 12    Duration Continue with 30 min of aerobic exercise without signs/symptoms of physical distress.    Intensity THRR unchanged      Progression   Progression Continue to progress workloads to maintain intensity without signs/symptoms of physical distress.      Resistance Training   Training Prescription Yes    Weight 8 lbs    Reps 10-15    Time 10 Minutes      Treadmill   MPH 2.2    Grade 1    Minutes 22    METs 2.97      Recumbant Elliptical   Level 4    RPM 82    Minutes 17    METs 4.8  Functional Capacity:  6 Minute Walk     Row Name 11/05/20 1342 03/11/21 1150       6 Minute Walk   Phase Initial Discharge    Distance 1550 feet 1650 feet    Distance Feet Change -- 100 ft    Walk Time 6 minutes 6 minutes    # of Rest Breaks 0 0    MPH 2.94 3.1    METS 4.2 4.21    RPE 12 14    Perceived Dyspnea  13 14    VO2 Peak 14.69 14.74    Symptoms No No    Resting HR 83 bpm 69 bpm    Resting BP 110/70 108/60    Resting Oxygen Saturation  97 % 97 %    Exercise Oxygen Saturation  during 6 min walk 94 % 99 %    Max Ex. HR 95 bpm 103 bpm    Max Ex. BP 134/68 120/64    2 Minute Post BP 104/70 105/60         Interval HR   1 Minute HR 92 103    2 Minute HR 92 100    3 Minute HR 88 97    4 Minute HR 92 100    5 Minute HR 92 99    6 Minute HR 95 98    2 Minute Post HR 81 90    Interval Heart Rate? Yes Yes         Interval Oxygen   Interval Oxygen? Yes Yes    Baseline Oxygen Saturation % 97 % 97 %    1 Minute Oxygen Saturation % 95 % 99 %    1 Minute Liters of Oxygen 0 L 0 L    2 Minute Oxygen Saturation % 96 % 97 %    2 Minute Liters of Oxygen 0 L 0 L    3 Minute Oxygen Saturation % 94 % 97 %    3 Minute Liters of  Oxygen 0 L 0 L    4 Minute Oxygen Saturation % 96 % 97 %    4 Minute Liters of Oxygen 0 L 0 L    5 Minute Oxygen Saturation % 96 % 97 %    5 Minute Liters of Oxygen 0 L 0 L    6 Minute Oxygen Saturation % 98 % 97 %    6 Minute Liters of Oxygen 0 L 0 L    2 Minute Post Oxygen Saturation % 98 % 99 %    2 Minute Post Liters of Oxygen 0 L 0 L             Psychological, QOL, Others - Outcomes: PHQ 2/9: Depression screen [facility] 2/9 03/16/2021 11/05/2020 09/25/2018 07/02/2018 07/02/2018  Decreased Interest 0 2 0 0 0  Down, Depressed, Hopeless 0 1 0 0 0  PHQ - 2 Score 0 3 0 0 0  Altered sleeping 0 3 - - -  Tired, decreased energy 1 3 - - -  Change in appetite 0 1 - - -  Feeling bad or failure about yourself  0 0 - - -  Trouble concentrating 0 0 - - -  Moving slowly or fidgety/restless 0 0 - - -  Suicidal thoughts 0 0 - - -  PHQ-9 Score 1 10 - - -  Difficult doing work/chores Somewhat difficult Very difficult - - -    Quality of Life:  Quality of Life - 03/16/21 0722  Quality of Life Scores   Health/Function Pre 16.88 %    Health/Function Post 25.5 %    Health/Function % Change 51.07 %    Socioeconomic Pre 23.71 %    Socioeconomic Post 27.75 %    Socioeconomic % Change  17.04 %    Psych/Spiritual Pre 27.43 %    Psych/Spiritual Post 28.29 %    Psych/Spiritual % Change 3.14 %    Family Pre 27.6 %    Family Post 28 %    Family % Change 1.45 %    GLOBAL Pre 21.89 %    GLOBAL Post 27 %    GLOBAL % Change 23.34 %             Personal Goals: Goals established at orientation with interventions provided to work toward goal.  Personal Goals and Risk Factors at Admission - 11/05/20 1246       Core Components/Risk Factors/Patient Goals on Admission    Weight Management Yes;Weight Maintenance;Weight Loss    Intervention Weight Management: Develop a combined nutrition and exercise program designed to reach desired caloric intake, while maintaining appropriate intake of  nutrient and fiber, sodium and fats, and appropriate energy expenditure required for the weight goal.;Weight Management: Provide education and appropriate resources to help participant work on and attain dietary goals.;Weight Management/Obesity: Establish reasonable short term and long term weight goals.    Expected Outcomes Short Term: Continue to assess and modify interventions until short term weight is achieved;Long Term: Adherence to nutrition and physical activity/exercise program aimed toward attainment of established weight goal;Weight Maintenance: Understanding of the daily nutrition guidelines, which includes 25-35% calories from fat, 7% or less cal from saturated fats, less than $RemoveB'200mg'ZRQOkkuO$  cholesterol, less than 1.5gm of sodium, & 5 or more servings of fruits and vegetables daily;Weight Loss: Understanding of general recommendations for a balanced deficit meal plan, which promotes 1-2 lb weight loss per week and includes a negative energy balance of [phone] kcal/d;Understanding recommendations for meals to include 15-35% energy as protein, 25-35% energy from fat, 35-60% energy from carbohydrates, less than $RemoveB'200mg'XPmcMAbc$  of dietary cholesterol, 20-35 gm of total fiber daily;Understanding of distribution of calorie intake throughout the day with the consumption of 4-5 meals/snacks    Improve shortness of breath with ADL's Yes    Intervention Provide education, individualized exercise plan and daily activity instruction to help decrease symptoms of SOB with activities of daily living.    Expected Outcomes Short Term: Improve cardiorespiratory fitness to achieve a reduction of symptoms when performing ADLs;Long Term: Be able to perform more ADLs without symptoms or delay the onset of symptoms              Personal Goals Discharge:  Goals and Risk Factor Review     Row Name 11/09/20 1234 12/02/20 0858 12/30/20 1247 01/26/21 1157 02/25/21 0938     Core Components/Risk Factors/Patient Goals Review    Personal Goals Review Other Other Other -- Other   Review Patient was referred to PR with DOE post COVID syndrome. He plans to start the program 11/10/20. His personal goals for the program are to decrease his SOB; increase his stamina; play golf again and be able to return to work in August. We will continue to monitor his progress as he works toward meeting these goals. Patient has completed 4 sessions losing 2 lbs since his initial visit. He is doing well in the program with consistent attendance. His O2 saturation averages 97-98% during exercise on RA. His blood pressure is  controlled. His pulmonologist gave him a return to work date of 02/02/21. His personal goals for the program are to decrease SOB; increase his stamina; return to work in August and play golf again. We will continue to monitor his progress as he works towards meeting these goals. Patient has completed 12 sessions gaining 6 lbs since last 30 day reivew. He continues to do well in the program with progressions and consistent attendance. He works hard and puts forth great effort during sessions. His blood pressure is well controlled. His O2 saturation averages 96-97% during exercise on RA. He saw an MD at [facility] 12/16/20 with chronic epitaxis. MD discontinued topical nasal spray steriods and instructed him to return if the bleeding continues. His personal goals for the program are to decrease SOB; increase his stamina; play golf; and be able to return to work in August. We will continue to monitor his progress as he works towards meeting these goals. Patient has completed 18 sessions  He continues to do well in the program with progressions and consistent attendance. He works hard and puts forth great effort during sessions. His with remains at 95.6kg.  His blood pressure is well controlled. His O2 saturation averages 97-98% during exercise on RA.  His personal goals for the program are to decrease SOB; increase his stamina; play golf; and be  able to return to work in August. We will continue to monitor his progress as he works towards meeting these goals. Patient has completed 24 sessions  He continues to do well in the program with progressions and consistent attendance. He works hard and puts forth great effort during sessions. He has maintained his weight since lase 30 day review.  His blood pressure is well controlled. His O2 saturation averages continues to average 97-98% during exercise on RA.  His personal goals for the program continue to be to decrease SOB; increase his stamina; play golf; and be able to return to work in August. We will continue to monitor his progress as he works towards meeting these goals.   Expected Outcomes Patient will complete the program meeting both program and personal goals. Patient will complete the program meeting both program and personal goals. Patient will complete the program meeting both program and personal goals. Patient will complete the program meeting both program and personal goals. Patient will complete the program meeting both program and personal goals.    [place] Name 03/16/21 1002             Core Components/Risk Factors/Patient Goals Review   Personal Goals Review Other;Weight Management/Obesity       Review Pt graduated from pulmonary rehab after 28 sessions. He had consistent attendance and gave good effort throughout the program. He gained 6.2 kg while in the program, and he reports this is consistent with his pre-COVID weight. He was able to return to work on a reduced schedule for about a month, and the week after he graduated, he reported that he was returning to full time work. He is now able to walk without SOB.       Expected Outcomes Patient will continue to work towards their goals post discharge.                Exercise Goals and Review:  Exercise Goals     Row Name 11/05/20 1345 11/17/20 1314 12/08/20 1447 01/05/21 1010 02/02/21 1211     Exercise Goals   Increase  Physical Activity Yes Yes Yes Yes Yes  Intervention Provide advice, education, support and counseling about physical activity/exercise needs.;Develop an individualized exercise prescription for aerobic and resistive training based on initial evaluation findings, risk stratification, comorbidities and participant's personal goals. Provide advice, education, support and counseling about physical activity/exercise needs.;Develop an individualized exercise prescription for aerobic and resistive training based on initial evaluation findings, risk stratification, comorbidities and participant's personal goals. Provide advice, education, support and counseling about physical activity/exercise needs.;Develop an individualized exercise prescription for aerobic and resistive training based on initial evaluation findings, risk stratification, comorbidities and participant's personal goals. Provide advice, education, support and counseling about physical activity/exercise needs.;Develop an individualized exercise prescription for aerobic and resistive training based on initial evaluation findings, risk stratification, comorbidities and participant's personal goals. Provide advice, education, support and counseling about physical activity/exercise needs.;Develop an individualized exercise prescription for aerobic and resistive training based on initial evaluation findings, risk stratification, comorbidities and participant's personal goals.   Expected Outcomes Short Term: Attend rehab on a regular basis to increase amount of physical activity.;Long Term: Add in home exercise to make exercise part of routine and to increase amount of physical activity.;Long Term: Exercising regularly at least 3-5 days a week. Short Term: Attend rehab on a regular basis to increase amount of physical activity.;Long Term: Add in home exercise to make exercise part of routine and to increase amount of physical activity.;Long Term: Exercising  regularly at least 3-5 days a week. Short Term: Attend rehab on a regular basis to increase amount of physical activity.;Long Term: Add in home exercise to make exercise part of routine and to increase amount of physical activity.;Long Term: Exercising regularly at least 3-5 days a week. Short Term: Attend rehab on a regular basis to increase amount of physical activity.;Long Term: Add in home exercise to make exercise part of routine and to increase amount of physical activity.;Long Term: Exercising regularly at least 3-5 days a week. Short Term: Attend rehab on a regular basis to increase amount of physical activity.;Long Term: Add in home exercise to make exercise part of routine and to increase amount of physical activity.;Long Term: Exercising regularly at least 3-5 days a week.   Increase Strength and Stamina Yes Yes Yes Yes Yes   Intervention Provide advice, education, support and counseling about physical activity/exercise needs.;Develop an individualized exercise prescription for aerobic and resistive training based on initial evaluation findings, risk stratification, comorbidities and participant's personal goals. Provide advice, education, support and counseling about physical activity/exercise needs.;Develop an individualized exercise prescription for aerobic and resistive training based on initial evaluation findings, risk stratification, comorbidities and participant's personal goals. Provide advice, education, support and counseling about physical activity/exercise needs.;Develop an individualized exercise prescription for aerobic and resistive training based on initial evaluation findings, risk stratification, comorbidities and participant's personal goals. Provide advice, education, support and counseling about physical activity/exercise needs.;Develop an individualized exercise prescription for aerobic and resistive training based on initial evaluation findings, risk stratification, comorbidities  and participant's personal goals. Provide advice, education, support and counseling about physical activity/exercise needs.;Develop an individualized exercise prescription for aerobic and resistive training based on initial evaluation findings, risk stratification, comorbidities and participant's personal goals.   Expected Outcomes Short Term: Increase workloads from initial exercise prescription for resistance, speed, and METs.;Short Term: Perform resistance training exercises routinely during rehab and add in resistance training at home;Long Term: Improve cardiorespiratory fitness, muscular endurance and strength as measured by increased METs and functional capacity (6MWT) Short Term: Increase workloads from initial exercise prescription for resistance, speed, and METs.;Short Term: Perform  resistance training exercises routinely during rehab and add in resistance training at home;Long Term: Improve cardiorespiratory fitness, muscular endurance and strength as measured by increased METs and functional capacity (6MWT) Short Term: Increase workloads from initial exercise prescription for resistance, speed, and METs.;Short Term: Perform resistance training exercises routinely during rehab and add in resistance training at home;Long Term: Improve cardiorespiratory fitness, muscular endurance and strength as measured by increased METs and functional capacity (6MWT) Short Term: Increase workloads from initial exercise prescription for resistance, speed, and METs.;Short Term: Perform resistance training exercises routinely during rehab and add in resistance training at home;Long Term: Improve cardiorespiratory fitness, muscular endurance and strength as measured by increased METs and functional capacity (6MWT) Short Term: Increase workloads from initial exercise prescription for resistance, speed, and METs.;Short Term: Perform resistance training exercises routinely during rehab and add in resistance training at  home;Long Term: Improve cardiorespiratory fitness, muscular endurance and strength as measured by increased METs and functional capacity (6MWT)   Able to understand and use rate of perceived exertion (RPE) scale Yes Yes Yes Yes Yes   Intervention Provide education and explanation on how to use RPE scale Provide education and explanation on how to use RPE scale Provide education and explanation on how to use RPE scale Provide education and explanation on how to use RPE scale Provide education and explanation on how to use RPE scale   Expected Outcomes Short Term: Able to use RPE daily in rehab to express subjective intensity level;Long Term:  Able to use RPE to guide intensity level when exercising independently Short Term: Able to use RPE daily in rehab to express subjective intensity level;Long Term:  Able to use RPE to guide intensity level when exercising independently Short Term: Able to use RPE daily in rehab to express subjective intensity level;Long Term:  Able to use RPE to guide intensity level when exercising independently Short Term: Able to use RPE daily in rehab to express subjective intensity level;Long Term:  Able to use RPE to guide intensity level when exercising independently Short Term: Able to use RPE daily in rehab to express subjective intensity level;Long Term:  Able to use RPE to guide intensity level when exercising independently   Able to understand and use Dyspnea scale Yes Yes Yes Yes Yes   Intervention Provide education and explanation on how to use Dyspnea scale Provide education and explanation on how to use Dyspnea scale Provide education and explanation on how to use Dyspnea scale Provide education and explanation on how to use Dyspnea scale Provide education and explanation on how to use Dyspnea scale   Expected Outcomes Short Term: Able to use Dyspnea scale daily in rehab to express subjective sense of shortness of breath during exertion;Long Term: Able to use Dyspnea scale  to guide intensity level when exercising independently Short Term: Able to use Dyspnea scale daily in rehab to express subjective sense of shortness of breath during exertion;Long Term: Able to use Dyspnea scale to guide intensity level when exercising independently Short Term: Able to use Dyspnea scale daily in rehab to express subjective sense of shortness of breath during exertion;Long Term: Able to use Dyspnea scale to guide intensity level when exercising independently Short Term: Able to use Dyspnea scale daily in rehab to express subjective sense of shortness of breath during exertion;Long Term: Able to use Dyspnea scale to guide intensity level when exercising independently Short Term: Able to use Dyspnea scale daily in rehab to express subjective sense of shortness of breath during exertion;Long  Term: Able to use Dyspnea scale to guide intensity level when exercising independently   Knowledge and understanding of Target Heart Rate Range (THRR) Yes Yes Yes Yes Yes   Intervention Provide education and explanation of THRR including how the numbers were predicted and where they are located for reference Provide education and explanation of THRR including how the numbers were predicted and where they are located for reference Provide education and explanation of THRR including how the numbers were predicted and where they are located for reference Provide education and explanation of THRR including how the numbers were predicted and where they are located for reference Provide education and explanation of THRR including how the numbers were predicted and where they are located for reference   Expected Outcomes Short Term: Able to state/look up THRR;Long Term: Able to use THRR to govern intensity when exercising independently;Short Term: Able to use daily as guideline for intensity in rehab Short Term: Able to state/look up THRR;Long Term: Able to use THRR to govern intensity when exercising  independently;Short Term: Able to use daily as guideline for intensity in rehab Short Term: Able to state/look up THRR;Long Term: Able to use THRR to govern intensity when exercising independently;Short Term: Able to use daily as guideline for intensity in rehab Short Term: Able to state/look up THRR;Long Term: Able to use THRR to govern intensity when exercising independently;Short Term: Able to use daily as guideline for intensity in rehab Short Term: Able to state/look up THRR;Long Term: Able to use THRR to govern intensity when exercising independently;Short Term: Able to use daily as guideline for intensity in rehab   Understanding of Exercise Prescription Yes Yes Yes Yes Yes   Intervention Provide education, explanation, and written materials on patient's individual exercise prescription Provide education, explanation, and written materials on patient's individual exercise prescription Provide education, explanation, and written materials on patient's individual exercise prescription Provide education, explanation, and written materials on patient's individual exercise prescription Provide education, explanation, and written materials on patient's individual exercise prescription   Expected Outcomes Short Term: Able to explain program exercise prescription;Long Term: Able to explain home exercise prescription to exercise independently Short Term: Able to explain program exercise prescription;Long Term: Able to explain home exercise prescription to exercise independently Short Term: Able to explain program exercise prescription;Long Term: Able to explain home exercise prescription to exercise independently Short Term: Able to explain program exercise prescription;Long Term: Able to explain home exercise prescription to exercise independently Short Term: Able to explain program exercise prescription;Long Term: Able to explain home exercise prescription to exercise independently    Row Name 03/02/21 1625              Exercise Goals   Increase Physical Activity Yes       Intervention Provide advice, education, support and counseling about physical activity/exercise needs.;Develop an individualized exercise prescription for aerobic and resistive training based on initial evaluation findings, risk stratification, comorbidities and participant's personal goals.       Expected Outcomes Short Term: Attend rehab on a regular basis to increase amount of physical activity.;Long Term: Add in home exercise to make exercise part of routine and to increase amount of physical activity.;Long Term: Exercising regularly at least 3-5 days a week.       Increase Strength and Stamina Yes       Intervention Provide advice, education, support and counseling about physical activity/exercise needs.;Develop an individualized exercise prescription for aerobic and resistive training based on initial evaluation findings, risk stratification, comorbidities  and participant's personal goals.       Expected Outcomes Short Term: Increase workloads from initial exercise prescription for resistance, speed, and METs.;Short Term: Perform resistance training exercises routinely during rehab and add in resistance training at home;Long Term: Improve cardiorespiratory fitness, muscular endurance and strength as measured by increased METs and functional capacity (6MWT)       Able to understand and use rate of perceived exertion (RPE) scale Yes       Intervention Provide education and explanation on how to use RPE scale       Expected Outcomes Short Term: Able to use RPE daily in rehab to express subjective intensity level;Long Term:  Able to use RPE to guide intensity level when exercising independently       Able to understand and use Dyspnea scale Yes       Intervention Provide education and explanation on how to use Dyspnea scale       Expected Outcomes Short Term: Able to use Dyspnea scale daily in rehab to express subjective sense of  shortness of breath during exertion;Long Term: Able to use Dyspnea scale to guide intensity level when exercising independently       Knowledge and understanding of Target Heart Rate Range (THRR) Yes       Intervention Provide education and explanation of THRR including how the numbers were predicted and where they are located for reference       Expected Outcomes Short Term: Able to state/look up THRR;Long Term: Able to use THRR to govern intensity when exercising independently;Short Term: Able to use daily as guideline for intensity in rehab       Understanding of Exercise Prescription Yes       Intervention Provide education, explanation, and written materials on patient's individual exercise prescription       Expected Outcomes Short Term: Able to explain program exercise prescription;Long Term: Able to explain home exercise prescription to exercise independently                Exercise Goals Re-Evaluation:  Exercise Goals Re-Evaluation     [place] Name 11/17/20 1310 12/08/20 1447 01/05/21 1010 02/02/21 1212 03/02/21 1625     Exercise Goal Re-Evaluation   Exercise Goals Review Increase Physical Activity;Increase Strength and Stamina;Able to understand and use rate of perceived exertion (RPE) scale;Able to understand and use Dyspnea scale;Knowledge and understanding of Target Heart Rate Range (THRR);Able to check pulse independently;Understanding of Exercise Prescription Increase Physical Activity;Increase Strength and Stamina;Able to understand and use rate of perceived exertion (RPE) scale;Able to understand and use Dyspnea scale;Knowledge and understanding of Target Heart Rate Range (THRR);Able to check pulse independently;Understanding of Exercise Prescription Increase Physical Activity;Increase Strength and Stamina;Able to understand and use rate of perceived exertion (RPE) scale;Able to understand and use Dyspnea scale;Knowledge and understanding of Target Heart Rate Range (THRR);Able to  check pulse independently;Understanding of Exercise Prescription Increase Physical Activity;Increase Strength and Stamina;Able to understand and use rate of perceived exertion (RPE) scale;Able to understand and use Dyspnea scale;Knowledge and understanding of Target Heart Rate Range (THRR);Able to check pulse independently;Understanding of Exercise Prescription Increase Physical Activity;Increase Strength and Stamina;Able to understand and use rate of perceived exertion (RPE) scale;Able to understand and use Dyspnea scale;Knowledge and understanding of Target Heart Rate Range (THRR);Able to check pulse independently;Understanding of Exercise Prescription   Comments patient has completed 2 exercise sessions. He has tolerated exercise well with no complaints. He ia very eager to get healthier and decrease his shortness of breath.  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 He is excited to come back to rehab. He is currenlty exercising at 3.6 METs on the elliptical. Will continue to monitor and progress as able. Pt has completed 6 exercise sessions. He is progressing through the program and has been able to increase his workloads. He wants to be able to get back to his prev-COVID functioning. He continues to exercise at home as well. He is currently exercising at 4.5 METs on the stepper. Will continue to monitor and progress as able. Pt has completed 13 exercise sessions. He continues to progress and increase his workloads. He is walking several days a week at home outside of rehab. He is currently exercising at 4.9 METs on the elliptical. Will continue to monitor and progress as able. Pt has completed 19 exercise sessions. He continues to progress his workloads. He recently was able to return to work on a reduced schedule. He is currently exercising at 5 METs on the elliptical. Will continue to monitor and progress as able. Pt has completed 25 exercise sessions. He continues to  progress in the program. He has returned to work and reports that he has been able to tolerate all of the walking that his job requires. He is currently exercising at 4.8 METs. Will continue to monitor and progress as able.   Expected Outcomes Through exercise at rehab and at home, patient will achieve their goals. Through exercise at rehab and at home, patient will achieve their goals. Through exercise at rehab and at home, patient will achieve their goals. Through exercise at rehab and at home, patient will achieve their goals. Through exercise at rehab and at home, patient will achieve their goals.            Nutrition & Weight - Outcomes:  Pre Biometrics - 11/17/20 1312       Pre Biometrics   Weight 200 lb 6.4 oz (90.9 kg)    BMI (Calculated) 25.05             Post Biometrics - 03/11/21 1149        Post  Biometrics   Height 6' 3"  (1.905 m)    Weight 209 lb 14.1 oz (95.2 kg)    Waist Circumference 41.5 inches    Hip Circumference 42 inches    Waist to Hip Ratio 0.99 %    BMI (Calculated) 26.23    Triceps Skinfold 20 mm    % Body Fat 28 %    Grip Strength 44.4 kg    Flexibility 25.5 in    Single Leg Stand 50 seconds             Nutrition:  Nutrition Therapy & Goals - 01/26/21 1153       Personal Nutrition Goals   Comments Nutritional hand out given to patient on how to choose healthy diet. Will continue to provide nutritional education through handouts and discussion.      Intervention Plan   Intervention Nutrition handout(s) given to patient.             Nutrition Discharge:  Nutrition Assessments - 03/16/21 1000       MEDFICTS Scores   Pre Score 71    Post Score 36    Score Difference -35             Education Questionnaire Score:  Knowledge Questionnaire Score - 03/16/21 1000       Knowledge Questionnaire Score  Pre Score 16/18    Post Score 14/18             Goals reviewed with patient; copy given to patient. Pt graduated  from pulmonary rehab after 28 sessions. He had consistent attendance and gave good effort throughout his time in the program. He gained 6.2 kg, and he reports that this is about even with his pre-COVID weight. He was able to increase his six minute walk test distance by 6.5%. His MET level at graduation was 5.0. He has been able to return to work on a full time schedule. His job requires a lot of walking, and he reports that he is able to handle this without SOB. He will continue to exercise on his own by walking in his neighborhood.

## 2021-04-21 ENCOUNTER — Other Ambulatory Visit: Payer: Self-pay

## 2021-04-21 ENCOUNTER — Encounter: Payer: Self-pay | Admitting: Urology

## 2021-04-21 ENCOUNTER — Ambulatory Visit: Payer: 59 | Admitting: Urology

## 2021-04-21 DIAGNOSIS — N486 Induration penis plastica: Secondary | ICD-10-CM | POA: Diagnosis not present

## 2021-04-21 MED ORDER — PENTOXIFYLLINE ER 400 MG PO TBCR: 400.0000 mg | EXTENDED_RELEASE_TABLET | Freq: Two times a day (BID) | ORAL | 2 refills | Status: DC

## 2021-04-21 NOTE — Progress Notes (Signed)
04/21/2021 9:28 AM   Marvin Munoz 1960/12/02 [id number]  Referring provider: Josetta Huddle, MD [place] [place],   [address]  Penile curvature   HPI: Mr Marvin Munoz is a 60yo here for evaluation of penile curvature. Starting 6 months ago he noted curvature with erections and penile pain with erections. The curvature has not changed in 3 months. He has been able to palpate a knot for 4 months. No hx of trauma. He has mild pain with erections and he has difficulty with penetration.  The curvature is at the mid penile shaft and is curves to up and to the left.  No issues urinating. No issues with erectile dysfunction.    PMH: Past Medical History:  Diagnosis Date   Allergic rhinitis    Eczema    Fever blister    occasional   Foot drop, right 09/24/2014   Gastroesophageal reflux    Gout    05-09-2018 per pt last flare up, 2014   Guillain Barr syndrome ([place]) 1970's   per pt received swine flu vaccination and regular flu vaccination at same time, was told one or the other may have caused guillain barre   HTN (hypertension)    Migraine headache    Mild aortic valve stenosis cardiologist-  dr Daneen Schick   per last echo  02-01-2017   moderately thickended and calcificed leaflets with mild AS and mild AR,  valve area 2.43cm^2   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 Tamala Julian--- per last CTA 02-21-2018 , 61mm    Surgical History: Past Surgical History:  Procedure Laterality Date   BREAST LUMPECTOMY Right 1990s   "benign"   CARDIAC CATHETERIZATION  09/10/2015   dr Fabio Asa  @[facility]    normal coronaries and LVF,  false positive cardiolite   COLONOSCOPY     KNEE ARTHROSCOPY WITH MEDIAL MENISECTOMY Left 05/15/2018   Procedure: LEFT KNEE ARTHROSCOPY WITH MEDIAL MENISECTOMY;  Surgeon: Marchia Bond, MD;  Location: [facility];  Service: Orthopedics;  Laterality: Left;   REPLACEMENT UNICONDYLAR JOINT KNEE Left 09/10/2015    dr Mardelle Matte  @[facility]    TENDON RECONSTRUCTION Right 06/02/2016   Procedure: right elbow extensor tendon repair and debridement and repair lateral collateral ligament;  Surgeon: Ninetta Lights, MD;  Location: [facility];  Service: Orthopedics;  Laterality: Right;   TONSILLECTOMY  child   TOTAL KNEE ARTHROPLASTY WITH REVISION COMPONENTS Left 05/15/2018   Procedure: conversion to LEFT total KNEE ARTHROPLASTY with hardware removal;  Surgeon: Marchia Bond, MD;  Location: [facility];  Service: Orthopedics;  Laterality: Left;  with block   TRANSTHORACIC ECHOCARDIOGRAM  02/01/2017   mild LVH,  ef 85-46%, Grade 2 diastolic dysfucntion/  moderately thickened moderately calcified AV leafleat with mild stenosis and moderate regurg. (valve area 2.43cm^2)/  mild to moderate calcified MV annulus with triv. regurg/ trivial TR/  m;ild RAE    Home Medications:  Allergies as of 04/21/2021       Reactions   Colchicine Diarrhea   Influenza Vaccines Other (See  Comments)   Guillain-barre syndrome   Adhesive [tape] Rash        Medication List        Accurate as of April 21, 2021  9:28 AM. If you have any questions, ask your nurse or doctor.          allopurinol 300 MG tablet Commonly known as: ZYLOPRIM Take 150 mg by mouth every evening.   aspirin EC 81 MG tablet Take 81 mg by mouth daily.   baclofen 10 MG tablet Commonly known as: LIORESAL Take 1 tablet (10 mg total) by mouth 3 (three) times daily. As needed for muscle spasm   Breo Ellipta 200-25 MCG/ACT Aepb Generic drug: fluticasone furoate-vilanterol Inhale 1 puff into the lungs  daily.   CALCIUM + D3 PO Take 1 tablet by mouth daily.   cetirizine 10 MG tablet Commonly known as: ZYRTEC Take 10 mg by mouth every morning.   diltiazem 180 MG 24 hr capsule Commonly known as: CARDIZEM CD Take 180 mg by mouth daily.   FIBER PO Take 1 capsule by mouth daily.   multivitamin tablet Take 1 tablet by mouth daily.   omeprazole 40 MG capsule Commonly known as: PRILOSEC Take 40 mg by mouth every morning.   predniSONE 10 MG tablet Commonly known as: DELTASONE Take 4 tablets (40 mg total) by mouth daily with breakfast.   rizatriptan 10 MG disintegrating tablet Commonly known as: MAXALT-MLT Take 10 mg by mouth as needed for migraine. May repeat in 2 hours if needed   rOPINIRole 1 MG tablet Commonly known as: REQUIP Take 2 mg by mouth daily.   rosuvastatin 5 MG tablet Commonly known as: CRESTOR TAKE (1) TABLET BY MOUTH ONCE DAILY.   sulfamethoxazole-trimethoprim 800-160 MG tablet Commonly known as: BACTRIM DS Take 1 tablet by mouth 3 (three) times a week.   valACYclovir 1000 MG tablet Commonly known as: VALTREX Take 1,000 mg by mouth daily as needed (for cold sores).   zolpidem 10 MG tablet Commonly known as: AMBIEN Take 10 mg by mouth as needed.        Allergies:  Allergies  Allergen Reactions   Colchicine Diarrhea   Influenza Vaccines Other (See Comments)    Guillain-barre syndrome   Adhesive [Tape] Rash    Family History: Family History  Problem Relation Age of Onset   Heart attack Father    Prostate cancer Father    COPD Mother        smoked   Asthma Mother    Arthritis/Rheumatoid Mother    Leukemia Maternal Grandfather    Prostate cancer Maternal Grandfather    Prostate cancer Maternal Uncle     Social History:  reports that he has never smoked. He has never used smokeless tobacco. He reports that he does not currently use alcohol. He reports that he does not use drugs.  ROS: All other review of systems were reviewed and are  negative except what is noted above in HPI  Physical Exam: There were no vitals taken for this visit.  Constitutional:  Alert and oriented, No acute distress. HEENT: [place] AT, moist mucus membranes.  Trachea midline, no masses. Cardiovascular: No clubbing, cyanosis, or edema. Respiratory: Normal respiratory effort, no increased work of breathing. GI: Abdomen is soft, nontender, nondistended, no abdominal masses GU: No CVA tenderness. Circumcised phallus. No masses/lesions on penis, testis, scrotum. Palpable 1cm dorsal peyronies plaque at the base of the penis Lymph: No cervical or inguinal lymphadenopathy. Skin: No rashes, bruises or suspicious lesions.  Neurologic: Grossly intact, no focal deficits, moving all 4 extremities. Psychiatric: Normal mood and affect.  Laboratory Data: Lab Results  Component Value Date   WBC 4.0 09/22/2020   HGB 13.0 09/22/2020   HCT 38.6 (L) 09/22/2020   MCV 93.4 09/22/2020   PLT 219.0 09/22/2020    Lab Results  Component Value Date   CREATININE 1.12 07/21/2020    No results found for: PSA  No results found for: TESTOSTERONE  No results found for: HGBA1C  Urinalysis No results found for: COLORURINE, APPEARANCEUR, LABSPEC, PHURINE, GLUCOSEU, HGBUR, BILIRUBINUR, KETONESUR, PROTEINUR, UROBILINOGEN, NITRITE, LEUKOCYTESUR  No results found for: LABMICR, [place], RBCUA, LABEPIT, MUCUS, BACTERIA  Pertinent Imaging:  No results found for this or any previous visit.  No results found for this or any previous visit.  No results found for this or any previous visit.  No results found for this or any previous visit.  No results found for this or any previous visit.  No results found for this or any previous visit.  No results found for this or any previous visit.  No results found for this or any previous visit.   Assessment & Plan:    1. Peyronie disease We discussed the management of peyronies disease including medical therapy, penile  plication, verapamil therapy and xiaflex therapy. After discussed the options the patient elects for medical therapy since he is in the active phase of peyronies disease. I will see him back in 3 months. We will trial trental 400mg  BID for 3 months    No follow-ups on file.  Nicolette Bang, MD  [facility] [place]

## 2021-04-21 NOTE — Patient Instructions (Signed)
Collagenase injection (Dupuytren's Contracture/Peyronie's Disease) What is this medication? COLLAGENASE (kohl LAH jen ace) is used to treat Dupuytren's contracture. This medicine may help straighten a bent finger by breaking up hard tissue. It is also used for Peyronie's disease by breaking up the hard tissue plaque that causes the curvature in the penis. This medicine may be used for other purposes; ask your health care provider or pharmacist if you have questions. COMMON BRAND NAME(S): Xiaflex What should I tell my care team before I take this medication? They need to know if you have any of these conditions: hemophilia low platelet counts take medicines that treat or prevent blood clots an unusual or allergic reaction to collagenase, other medicines, foods, dyes, or preservatives pregnant or trying to get pregnant breast-feeding How should I use this medication? This medicine is for injection into the hand or penis. It is given by a health care professional in a hospital or clinic setting. A special MedGuide will be given to you by the pharmacist with each prescription and refill. Be sure to read this information carefully each time. Talk to your pediatrician regarding the use of this medicine in children. Special care may be needed. Overdosage: If you think you have taken too much of this medicine contact a poison control center or emergency room at once. NOTE: This medicine is only for you. Do not share this medicine with others. What if I miss a dose? It is important not to miss your dose. Call your doctor or health care professional if you are unable to keep an appointment. What may interact with this medication? aspirin and aspirin-like medicines certain medicines that treat or prevent blood clots like warfarin, enoxaparin, and dalteparin This list may not describe all possible interactions. Give your health care provider a list of all the medicines, herbs, non-prescription drugs, or  dietary supplements you use. Also tell them if you smoke, drink alcohol, or use illegal drugs. Some items may interact with your medicine. What should I watch for while using this medication? Your condition will be monitored carefully while you are receiving this medicine. If being treated for Dupuytren's contracture, return to your healthcare provider the day after your hand is injected. In the meantime, do not flex or extend the fingers of your hand that was injected. Do not touch your finger that was injected, and elevate your hand until bedtime. Do not perform activity with the injected hand until you are told that it is OK. Follow any instructions about wearing a splint or performing finger exercises. Also, call your healthcare provider if you get increasing redness or swelling in the hand, if you have numbness or tingling in the treated finger, or if you have trouble bending the finger after the swelling goes down. If being treated for Peyronie's disease, you will need to return to your healthcare provider for a manual procedure that will stretch and help straighten your penis. Also, your healthcare provider will show you how to gently stretch your penis at home. Do not resume sexual activity until you are told that it is okay. Follow instructions on when to return for follow-up visits. Immediately call your doctor if you have trouble stretching or straightening your penis, or if you have pain or other concerns. Immediately call your healthcare provider if you get a fever or chills. What side effects may I notice from receiving this medication? Side effects that you should report to your doctor or health care professional as soon as possible: allergic reactions like   skin rash, itching or hives, swelling of the face, lips, or tongue chest pain or palpitations infection (fever, chills, increasing redness or swelling at treated site) numbness or tingling in a treated hand popping sound or sensation in  an erect penis sudden back pain or trouble walking sudden loss of the ability to maintain an erection trouble breathing trouble urinating or blood in the urine unusual pain, swelling, or bruising of the penis Side effects that usually do not require medical attention (report to your doctor or health care professional if they continue or are bothersome): bleeding or bruising at site where injected irritation at site where injected mild pain or swelling at site where injected This list may not describe all possible side effects. Call your doctor for medical advice about side effects. You may report side effects to FDA at 1-800-FDA-1088. Where should I keep my medication? This drug is given in a hospital or clinic and will not be stored at home. NOTE: This sheet is a summary. It may not cover all possible information. If you have questions about this medicine, talk to your doctor, pharmacist, or health care provider.  2022 Elsevier/Gold Standard (2019-10-04 17:03:04)  

## 2021-04-21 NOTE — Progress Notes (Signed)
Urological Symptom Review  Patient is experiencing the following symptoms: Get up at night to urinate Stream starts and stops Have to strain to urinate Penile pain (male only)    Review of Systems  Gastrointestinal (upper)  : Indigestion/heartburn  Gastrointestinal (lower) : Negative for lower GI symptoms  Constitutional : Negative for symptoms  Skin: Negative for skin symptoms  Eyes: Negative for eye symptoms  Ear/Nose/Throat : Negative for Ear/Nose/Throat symptoms  Hematologic/Lymphatic: Easy bruising  Cardiovascular : Negative for cardiovascular symptoms  Respiratory : Negative for respiratory symptoms  Endocrine: Negative for endocrine symptoms  Musculoskeletal: Back pain Joint pain  Neurological: Negative for neurological symptoms  Psychologic: Negative for psychiatric symptoms

## 2021-05-25 NOTE — Progress Notes (Signed)
@Patient  ID: Marvin Munoz, male    DOB: 30-Jun-1960, 60 y.o.   MRN: [id number]  Chief Complaint  Patient presents with   Follow-up    Patient reports that he is doing a follow up after covid, still having green-yellow sputum and some shortness of breath with exertion.     Referring provider: Josetta Huddle, MD  HPI: 60 year old, never smoker followed for mild asthma, post COVID-19 syndrome, and upper airway cough syndrome.  He is a patient of Dr. Matilde Bash and was last seen in office on 01/18/2021.  Past medical history significant for aortic valve disease, hypertension, GERD, gout, nocturnal leg cramps.  TEST/EVENTS:  04/15/2019 echocardiogram: LVEF 60 to 65%, mild LVH, normal RV size and function, mild to moderate aortic regurgitation 08/17/2020 CTA chest: Calcification of the aortic valve, multifocal peripheral groundglass opacities bilaterally 09/07/2020 CXR: Persistent bilateral infiltrates 09/14/2020 CXR: Slight improvement in lung infiltrates 09/22/2020 labs: WBC 4, eos 1.8%, absolute eosinophil count 72; BNP 101; d-dimer 1.65 09/25/2018 CTA chest: No PE, improvement in bilateral groundglass opacities, CAD 10/26/2020 PFTs: FVC 4.81 (86%), FEV1 4.06 (96%), F/F 84, TLC 7.45 (94), DLCO 25.71 (81).  Normal test  01/18/2021: OV with Dr. Vaughan Browner.  Persistent but improved dyspnea upon exertion with dry cough.  Previously tapered off prednisone in June 2022.  Started pulmonary rehab in July and reported improvement at this visit.  Was previously on short-term disability and set to go back to work on 8/12.  No significant ILD on CTA and PFTs were normal.  Continue pulmonary rehab.  Follow-up 6 months  05/26/2021: Today - acute sick visit Patient presents today after testing positive for COVID19 on 05/16/2021. His symptoms have somewhat improved, but he continues to experience yellow/green rhinorrhea, a productive cough with yellow/green sputum, and occasional shortness of breath with exertion. He has  occasion nasal congestion with sinus pain. He did have a nose bleed yesterday from blowing his nose and coughing so frequently. This resolved quickly and without intervention. He does take a baby aspirin daily but is not on any other anticoagulation medications. He denies hemoptysis. His last fever was last week. He denies any new fevers, chills, or body aches. He denies orthopnea, PND, wheezing, chest pain/tightness, or lower extremity swelling. He has been taking mucinex DM otc with minimal relief in his symptoms. He continues on his omeprazole and Zyrtec daily. Prior to this, his breathing was stable. He had completed pulmonary rehab and had returned to work at full capacity. His mother just recently passed away so he has been out of work because of that and him testing positive. He has some fatigue associated with this. He states he is coping well and feels as though she is "in a better place". Overall, he feels ok but is concerned about this turning into a pneumonia.   Allergies  Allergen Reactions   Influenza Vaccines Other (See Comments)    Guillain-barre syndrome   Adhesive [Tape] Rash   Colchicine Diarrhea    Immunization History  Administered Date(s) Administered   Pneumococcal Conjugate-13 11/03/2014   Pneumococcal Polysaccharide-23 11/25/2015   Tdap 08/11/2010, 02/19/2021    Past Medical History:  Diagnosis Date   Allergic rhinitis    Eczema    Fever blister    occasional   Foot drop, right 09/24/2014   Gastroesophageal reflux    Gout    05-09-2018 per pt last flare up, 2014   Guillain Barr syndrome ([place]) 1970's   per pt received swine flu vaccination and regular  flu vaccination at same time, was told one or the other may have caused guillain barre   HTN (hypertension)    Migraine headache    Mild aortic valve stenosis cardiologist-  dr Daneen Schick   per last echo  02-01-2017   moderately thickended and calcificed leaflets with mild AS and mild AR,  valve area 2.43cm^2    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 Tamala Julian--- per last CTA 02-21-2018 , 71mm    Tobacco History: Social History   Tobacco Use  Smoking Status Never  Smokeless Tobacco Never   Counseling given: Not Answered   Outpatient Medications Prior to Visit  Medication Sig Dispense Refill   allopurinol (ZYLOPRIM) 300 MG tablet Take 150 mg by mouth every evening.      aspirin EC 81 MG tablet Take 81 mg by mouth daily.     baclofen (LIORESAL) 10 MG tablet Take 1 tablet (10 mg total) by mouth 3 (three) times daily. As needed for muscle spasm 50 tablet 0   Calcium Carb-Cholecalciferol (CALCIUM + D3 PO) Take 1 tablet by mouth daily.      cetirizine (ZYRTEC) 10 MG tablet Take 10 mg by mouth every morning.      diltiazem (CARDIZEM CD) 180 MG 24 hr capsule Take 180 mg by mouth daily.     FIBER PO Take 1 capsule by mouth daily.     Multiple Vitamin (MULTIVITAMIN) tablet Take 1 tablet by mouth daily.     omeprazole (PRILOSEC) 40 MG capsule Take 40 mg by mouth every morning.      pentoxifylline (TRENTAL) 400 MG CR tablet Take 1 tablet (400 mg total) by mouth 2 (two) times daily. 60 tablet 2   rizatriptan (MAXALT-MLT) 10 MG disintegrating tablet Take 10 mg by mouth as needed for migraine. May repeat in 2 hours if needed     rOPINIRole (REQUIP) 1 MG tablet Take 2 mg by mouth daily.     rosuvastatin (CRESTOR) 5 MG tablet TAKE (1) TABLET BY MOUTH ONCE DAILY. 30 tablet 10   sulfamethoxazole-trimethoprim (BACTRIM DS) 800-160 MG tablet Take 1 tablet by mouth 3 (three) times a week. 12 tablet 2   valACYclovir (VALTREX) 1000  MG tablet Take 1,000 mg by mouth daily as needed (for cold sores).      zolpidem (AMBIEN) 10 MG tablet Take 10 mg by mouth as needed.     predniSONE (DELTASONE) 10 MG tablet Take 4 tablets (40 mg total) by mouth daily with breakfast. 120 tablet 2   fluticasone furoate-vilanterol (BREO ELLIPTA) 200-25 MCG/INH AEPB Inhale 1 puff into the lungs daily. (Patient not taking: Reported on 05/26/2021) 60 each 6   No facility-administered medications prior to visit.     Review of Systems:   Constitutional: No weight loss or gain, night sweats, fevers, chills. +fatigue HEENT: No headaches, difficulty swallowing, tooth/dental problems, or sore throat. No sneezing, itching, ear ache. +Nasal congestion, green/yellow rhinorrhea, sinus pressure/pain, nose bleed (x1) CV:  No chest pain, orthopnea, PND, swelling in lower extremities, anasarca, dizziness, palpitations, syncope Resp: +  shortness of breath with exertion; productive cough (yellow/green sputum). No hemoptysis. No wheezing.  No chest wall deformity GI:  No heartburn, indigestion, abdominal pain, nausea, vomiting, diarrhea, change in bowel habits, loss of appetite, bloody stools.  GU: No dysuria, change in color of urine, urgency or frequency.  No flank pain, no hematuria  Skin: No rash, lesions, ulcerations MSK:  No joint pain or swelling.  No decreased range of motion.  No back pain. Neuro: No dizziness or lightheadedness.  Psych: No depression or anxiety. Mood stable.     Physical Exam:  BP 120/78 (BP Location: Left Arm, Patient Position: Sitting, Cuff Size: Normal)   Pulse 75   Temp 97.7 F (36.5 C) (Oral)   Ht 6\' 3"  (1.905 m)   Wt 206 lb (93.4 kg)   SpO2 97%   BMI 25.75 kg/m   GEN: Pleasant, interactive, well-nourished; in no acute distress HEENT:  Normocephalic and atraumatic. EACs patent bilaterally. TM pearly gray with present light reflex bilaterally. PERRLA. Sclera white. Nasal turbinates pink, moist and patent bilaterally. Clear  rhinorrhea present. Oropharynx erythematous and moist, without exudate or edema. No lesions, ulcerations NECK:  Supple w/ fair ROM. No JVD present. Normal carotid impulses w/o bruits. Thyroid symmetrical with no goiter or nodules palpated. +cervical lymphadenopathy CV: RRR, no m/r/g, no peripheral edema. Pulses intact, +2 bilaterally. No cyanosis, pallor or clubbing. PULMONARY:  Unlabored, regular breathing. Clear bilaterally A&P w/o wheezes/rales/rhonchi. No accessory muscle use. No dullness to percussion. GI: BS present and normoactive. Soft, non-tender to palpation. No organomegaly or masses detected. No CVA tenderness. MSK: No erythema, warmth or tenderness. Cap refil <2 sec all extrem. No deformities or joint swelling noted.  Neuro: A/Ox3. No focal deficits noted.   Skin: Warm, no lesions or rashe Psych: Normal affect and behavior. Judgement and thought content appropriate.     Lab Results:  CBC    Component Value Date/Time   WBC 4.0 09/22/2020 1010   RBC 4.14 (L) 09/22/2020 1010   HGB 13.0 09/22/2020 1010   HCT 38.6 (L) 09/22/2020 1010   PLT 219.0 09/22/2020 1010   MCV 93.4 09/22/2020 1010   MCH 31.4 07/26/2018 0938   MCHC 33.6 09/22/2020 1010   RDW 13.9 09/22/2020 1010   LYMPHSABS 1.4 09/22/2020 1010   MONOABS 0.4 09/22/2020 1010   EOSABS 0.1 09/22/2020 1010   BASOSABS 0.0 09/22/2020 1010    BMET    Component Value Date/Time   NA 142 07/21/2020 1220   K 4.5 07/21/2020 1220   CL 104 07/21/2020 1220   CO2 25 07/21/2020 1220   GLUCOSE 101 (H) 07/21/2020 1220   GLUCOSE 103 (H) 07/26/2018 0938   BUN 15 07/21/2020 1220   CREATININE 1.12 07/21/2020 1220   CREATININE 1.11 07/26/2018 0938   CALCIUM 9.9 07/21/2020 1220   GFRNONAA 71 07/21/2020 1220   GFRAA 82 07/21/2020 1220    BNP No results found for: BNP   Imaging:  05/26/2021: CXR reviewed by me. Lungs clear. Normal exam. No acute cardiopulmonary disease.  DG Chest 2 View  Result Date: 05/26/2021 CLINICAL  DATA:  Increased shortness of breath, cough post COVID EXAM: CHEST - 2 VIEW COMPARISON:  09/14/2020 FINDINGS: The heart size and mediastinal contours are within normal limits. Both lungs are clear. The visualized skeletal structures are unremarkable. IMPRESSION: No active cardiopulmonary disease. Electronically Signed   By: Jerilynn Mages.  Shick M.D.   On: 05/26/2021 09:41      PFT Results Latest Ref Rng & Units 10/26/2020  FVC-Pre L 4.43  FVC-Predicted Pre % 79  FVC-Post L 4.81  FVC-Predicted Post % 86  Pre FEV1/FVC % % 84  Post FEV1/FCV % % 84  FEV1-Pre L 3.72  FEV1-Predicted Pre % 88  FEV1-Post L 4.06  DLCO uncorrected ml/min/mmHg 25.71  DLCO UNC% % 81  DLCO corrected ml/min/mmHg 27.01  DLCO COR %Predicted % 85  DLVA Predicted % 96  TLC L 7.45  TLC % Predicted % 94  RV % Predicted % 113    No results found for: NITRICOXIDE      Assessment & Plan:   Post-COVID syndrome Symptoms previously controlled. Completed pulm rehab and returned to work. Stopped Breo as he did not feel a difference on it and it was a trial. Positive for COVID 19 11/27. Persistent cough and increased SOB upon exertion. Rhinosinusitis symptoms. Given length >10, abx indicated. Augmentin twice daily x 7 days and prednisone 40 mg x5 days rx. CXR today with both lung fields clear and no acute cardiopulmonary disease.   Patient Instructions  -Continue Zyrtec 10 mg daily  -Continue omeprazole 40 mg daily   -Augmentin Twice daily for seven days. Take with food. Notify immediately of any rash, itching, hives, or swelling, or seek emergency care.Finish your antibiotics in their entirety. Do not stop just because symptoms improve.  Take with food to reduce GI upset.  -Prednisone 40 mg for 5 days. Take in the morning with food.  -Flonase 2 sprays each nostril daily until symptoms improve then 1-2 sprays each nostril daily as needed for congestion and postnasal drip  Saline nasal 2-3 times a day Mucinex DM over the  counterTwice daily  Chlortab 4 mg over the counter at bedtime Tylenol or ibuprofen over the counter as directed for headaches/sinus pain Probiotic over the counter daily while on Augmentin   Activity, as tolerated.  Okay to return to work on Monday, 12/12.   Chest x ray today. We will notify you of results.   Follow up in 3 months with Dr. Vaughan Browner. If symptoms do not improve or worsen, please contact office for sooner follow up or seek emergency care.    Cough Abx and prednisone. Supportive care. CXR today. See above plan.  Acute bacterial rhinosinusitis >10 days of symptoms with minimal improvement. Abx indicated. Standard strength Augmentin x 7 days. Supportive care. See above plan.     Clayton Bibles, NP 05/26/2021  Pt aware and understands NP's role.

## 2021-05-26 ENCOUNTER — Other Ambulatory Visit: Payer: Self-pay

## 2021-05-26 ENCOUNTER — Encounter: Payer: Self-pay | Admitting: Nurse Practitioner

## 2021-05-26 ENCOUNTER — Ambulatory Visit (INDEPENDENT_AMBULATORY_CARE_PROVIDER_SITE_OTHER): Payer: 59

## 2021-05-26 ENCOUNTER — Ambulatory Visit: Payer: 59 | Admitting: Nurse Practitioner

## 2021-05-26 VITALS — BP 120/78 | HR 75 | Temp 97.7°F | Ht 75.0 in | Wt 206.0 lb

## 2021-05-26 DIAGNOSIS — R051 Acute cough: Secondary | ICD-10-CM

## 2021-05-26 DIAGNOSIS — U099 Post covid-19 condition, unspecified: Secondary | ICD-10-CM | POA: Diagnosis not present

## 2021-05-26 DIAGNOSIS — J019 Acute sinusitis, unspecified: Secondary | ICD-10-CM

## 2021-05-26 DIAGNOSIS — R059 Cough, unspecified: Secondary | ICD-10-CM | POA: Insufficient documentation

## 2021-05-26 DIAGNOSIS — B9689 Other specified bacterial agents as the cause of diseases classified elsewhere: Secondary | ICD-10-CM

## 2021-05-26 MED ORDER — AMOXICILLIN-POT CLAVULANATE 875-125 MG PO TABS: 1.0000 | ORAL_TABLET | Freq: Two times a day (BID) | ORAL | 0 refills | Status: AC

## 2021-05-26 MED ORDER — FLUTICASONE PROPIONATE 50 MCG/ACT NA SUSP: 1.0000 | Freq: Every day | NASAL | 2 refills | Status: DC

## 2021-05-26 MED ORDER — PREDNISONE 20 MG PO TABS: 40.0000 mg | ORAL_TABLET | Freq: Every day | ORAL | 0 refills | Status: AC

## 2021-05-26 NOTE — Assessment & Plan Note (Signed)
>  10 days of symptoms with minimal improvement. Abx indicated. Standard strength Augmentin x 7 days. Supportive care. See above plan.

## 2021-05-26 NOTE — Assessment & Plan Note (Signed)
Abx and prednisone. Supportive care. CXR today. See above plan.

## 2021-05-26 NOTE — Progress Notes (Signed)
Patient was notified of the results and voices understanding. Nothing further needed.

## 2021-05-26 NOTE — Assessment & Plan Note (Addendum)
Symptoms previously controlled. Completed pulm rehab and returned to work. Stopped Breo as he did not feel a difference on it and it was a trial. Positive for COVID 19 11/27. Persistent cough and increased SOB upon exertion. Rhinosinusitis symptoms. Given length >10, abx indicated. Augmentin twice daily x 7 days and prednisone 40 mg x5 days rx. CXR today with both lung fields clear and no acute cardiopulmonary disease.   Patient Instructions  -Continue Zyrtec 10 mg daily  -Continue omeprazole 40 mg daily   -Augmentin Twice daily for seven days. Take with food. Notify immediately of any rash, itching, hives, or swelling, or seek emergency care.Finish your antibiotics in their entirety. Do not stop just because symptoms improve.  Take with food to reduce GI upset.  -Prednisone 40 mg for 5 days. Take in the morning with food.  -Flonase 2 sprays each nostril daily until symptoms improve then 1-2 sprays each nostril daily as needed for congestion and postnasal drip  Saline nasal 2-3 times a day Mucinex DM over the counterTwice daily  Chlortab 4 mg over the counter at bedtime Tylenol or ibuprofen over the counter as directed for headaches/sinus pain Probiotic over the counter daily while on Augmentin   Activity, as tolerated.  Okay to return to work on Monday, 12/12.   Chest x ray today. We will notify you of results.   Follow up in 3 months with Dr. Vaughan Browner. If symptoms do not improve or worsen, please contact office for sooner follow up or seek emergency care.

## 2021-05-26 NOTE — Patient Instructions (Addendum)
-  Continue Zyrtec 10 mg daily  -Continue omeprazole 40 mg daily   -Augmentin Twice daily for seven days. Take with food. Notify immediately of any rash, itching, hives, or swelling, or seek emergency care.Finish your antibiotics in their entirety. Do not stop just because symptoms improve.  Take with food to reduce GI upset.  -Prednisone 40 mg for 5 days. Take in the morning with food.  -Flonase 2 sprays each nostril daily until symptoms improve then 1-2 sprays each nostril daily as needed for congestion and postnasal drip  Saline nasal 2-3 times a day Mucinex DM over the counterTwice daily  Chlortab 4 mg over the counter at bedtime Tylenol or ibuprofen over the counter as directed for headaches/sinus pain Probiotic over the counter daily while on Augmentin   Activity, as tolerated.  Okay to return to work on Monday, 12/12.   Chest x ray today. We will notify you of results.   Follow up in 3 months with Dr. Vaughan Browner. If symptoms do not improve or worsen, please contact office for sooner follow up or seek emergency care.

## 2021-05-28 ENCOUNTER — Telehealth: Payer: Self-pay | Admitting: Nurse Practitioner

## 2021-05-28 NOTE — Telephone Encounter (Signed)
Received disability forms from [employer]. Patient is currently out of work starting on 05/17/2021 and returning 05/31/2021 with no restrictions. Emailed to [employer] to review.

## 2021-06-02 ENCOUNTER — Other Ambulatory Visit: Payer: Self-pay | Admitting: *Deleted

## 2021-06-02 DIAGNOSIS — I712 Thoracic aortic aneurysm, without rupture, unspecified: Secondary | ICD-10-CM

## 2021-06-02 NOTE — Telephone Encounter (Signed)
Forms faxed to [employer], copy mailed to pt, copy placed in scan.

## 2021-07-19 NOTE — Progress Notes (Signed)
Office Visit    Patient Name: Marvin Munoz Date of Encounter: 07/20/2021  PCP:  Josetta Huddle, MD   [facility]  Cardiologist:  Sinclair Grooms, MD  Advanced Practice Provider:  No care team member to display Electrophysiologist:  None    HPI    Marvin Munoz is a 61 y.o. male with a hx of bicuspid aortic valve, ascending aortic aneurysm, aortic regurgitation, hyperlipidemia, and primary hypertension presents today for annual follow-up appointment.  He was last seen February 2022 and did not have any cardiac complaints at that time.  He had not had any chest pain, orthopnea, PND, lower extremity edema, or other complaints. He tested positive for COVID 19 back in November 2022. He was seen by Pulmonary due to continued nasal congestion and productive cough. He was prescribed Augmentin at this time and Prednisone.   Today, he states that he feels good from a cardiac standpoint.  He recently had COVID-19 and then acquired a respiratory infection which was treated with a round of antibiotics and steroids.  He underwent pulmonary rehab and has finished with that.  He states he feels much better.  He has been walking daily and also enjoys riding a bike.  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  He had a recent lipid panel done in September 2022 and his LDL was within goal.  His CT scan for his ascending thoracic aortic aneurysm done in February 2022 was reviewed.  He has another CTA scheduled for February 2023.  His blood pressure slightly elevated today in the office but most of the time at home his blood pressure is 130/60-80.  I have asked him to keep a log of his blood pressure and send me the values after 2 weeks via MyChart.  We do have room to increase his diltiazem if needed.  We discussed how important it is to maintain tight blood pressure control in the setting of a ascending  thoracic aortic aneurysm.  Reports no shortness of breath nor dyspnea on exertion. Reports no chest pain, pressure, or tightness. No edema, orthopnea, PND. Reports no palpitations.    Past Medical History    Past Medical History:  Diagnosis Date   Allergic rhinitis    Eczema    Fever blister    occasional   Foot drop, right 09/24/2014   Gastroesophageal reflux    Gout    05-09-2018 per pt last flare up, 2014   Guillain Barr syndrome ([place]) 1970's   per pt received swine flu vaccination and regular flu vaccination at same time, was told one or the other may have caused guillain barre   HTN (hypertension)    Migraine headache    Mild aortic valve stenosis cardiologist-  dr Daneen Schick   per last echo  02-01-2017   moderately thickended and calcificed leaflets with mild AS and mild AR,  valve area 2.43cm^2   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 Tamala Julian--- per last CTA 02-21-2018 , 93mm   Past Surgical History:  Procedure Laterality Date   BREAST LUMPECTOMY Right 1990s   "benign"   CARDIAC CATHETERIZATION  09/10/2015   dr Bonnita Nasuti pre[place]  @[facility]    normal coronaries and LVF,  false positive cardiolite   COLONOSCOPY     KNEE ARTHROSCOPY WITH MEDIAL MENISECTOMY Left 05/15/2018   Procedure: LEFT KNEE ARTHROSCOPY WITH MEDIAL MENISECTOMY;  Surgeon: Marchia Bond, MD;  Location: [facility];  Service: Orthopedics;  Laterality: Left;   REPLACEMENT UNICONDYLAR JOINT KNEE Left 09/10/2015    dr Mardelle Matte  @[facility]    TENDON RECONSTRUCTION Right 06/02/2016   Procedure: right elbow extensor tendon repair and debridement and  repair lateral collateral ligament;  Surgeon: Ninetta Lights, MD;  Location: [facility];  Service: Orthopedics;  Laterality: Right;   TONSILLECTOMY  child   TOTAL KNEE ARTHROPLASTY WITH REVISION COMPONENTS Left 05/15/2018   Procedure: conversion to LEFT total KNEE ARTHROPLASTY with hardware removal;  Surgeon: Marchia Bond, MD;  Location: [facility];  Service: Orthopedics;  Laterality: Left;  with block   TRANSTHORACIC ECHOCARDIOGRAM  02/01/2017   mild LVH,  ef 50-09%, Grade 2 diastolic dysfucntion/  moderately thickened moderately calcified AV leafleat with mild stenosis and moderate regurg. (valve area 2.43cm^2)/  mild to moderate calcified MV annulus with triv. regurg/ trivial TR/  m;ild RAE    Allergies  Allergies  Allergen Reactions   Influenza Vaccines Other (See Comments)    Guillain-barre syndrome   Adhesive [Tape] Rash   Colchicine Diarrhea     EKGs/Labs/Other Studies Reviewed:   The following studies were reviewed today:  ECHOCARDIOGRAM 2020: IMPRESSIONS   1. Left ventricular ejection fraction, by visual estimation, is 60 to  65%. The left ventricle has normal function. Normal left ventricular size.  There is mildly increased left ventricular hypertrophy.   2. Global right ventricle has normal systolic function.The right  ventricular size is normal.   3. Left atrial size was normal.   4. Right atrial size was normal.   5. The mitral valve is normal in structure. Trace mitral valve  regurgitation. No evidence of mitral stenosis.   6. The tricuspid valve is normal in structure. Tricuspid valve  regurgitation is trivial.   7. The aortic valve has an indeterminant number of cusps Aortic valve  regurgitation is mild to moderate by color flow Doppler.   8. The pulmonic valve was normal in structure. Pulmonic valve  regurgitation is trivial by color flow Doppler.   9. Aortic dilatation noted.  10. There is mild dilatation of the ascending aorta measuring 40  mm.  11. The inferior vena cava is normal in size with greater than 50%  respiratory variability, suggesting right atrial pressure of 3 mmHg.  12. Normal LV systolic function; mild LVH; mildly dilated ascending aorta;  mild to moderate AI.   EKG:  EKG is ordered today.  The ekg ordered today demonstrates NSR  Recent Labs: 07/21/2020: BUN 15; Creatinine, Ser 1.12; Potassium 4.5; Sodium 142 09/22/2020: Hemoglobin 13.0; NT-Pro BNP 101; Platelets 219.0  Recent Lipid Panel    Component Value Date/Time   CHOL 153 06/18/2019 0914   TRIG 108 06/18/2019 0914   HDL 48 06/18/2019 0914   CHOLHDL 3.2 06/18/2019 0914   LDLCALC 85 06/18/2019 0914   Home Medications   Current Meds  Medication Sig   allopurinol (ZYLOPRIM) 300 MG tablet Take 150 mg by mouth every evening.    aspirin EC 81  MG tablet Take 81 mg by mouth daily.   baclofen (LIORESAL) 10 MG tablet Take 1 tablet (10 mg total) by mouth 3 (three) times daily. As needed for muscle spasm   Calcium Carb-Cholecalciferol (CALCIUM + D3 PO) Take 1 tablet by mouth daily.    cetirizine (ZYRTEC) 10 MG tablet Take 10 mg by mouth every morning.    diltiazem (CARDIZEM CD) 180 MG 24 hr capsule Take 180 mg by mouth daily.   FIBER PO Take 1 capsule by mouth daily.   fluticasone (FLONASE) 50 MCG/ACT nasal spray Place 1 spray into both nostrils daily.   Multiple Vitamin (MULTIVITAMIN) tablet Take 1 tablet by mouth daily.   omeprazole (PRILOSEC) 40 MG capsule Take 40 mg by mouth every morning.    pentoxifylline (TRENTAL) 400 MG CR tablet Take 1 tablet (400 mg total) by mouth 2 (two) times daily.   rizatriptan (MAXALT-MLT) 10 MG disintegrating tablet Take 10 mg by mouth as needed for migraine. May repeat in 2 hours if needed   rOPINIRole (REQUIP) 1 MG tablet Take 2 mg by mouth daily.   rosuvastatin (CRESTOR) 5 MG tablet TAKE (1) TABLET BY MOUTH ONCE DAILY.   sulfamethoxazole-trimethoprim (BACTRIM DS) 800-160 MG tablet Take 1 tablet by mouth 3 (three) times a  week.   valACYclovir (VALTREX) 1000 MG tablet Take 1,000 mg by mouth daily as needed (for cold sores).    zolpidem (AMBIEN) 10 MG tablet Take 10 mg by mouth as needed.     Review of Systems      All other systems reviewed and are otherwise negative except as noted above.  Physical Exam    VS:  BP 140/80 (BP Location: Right Arm)    Pulse 65    Ht 6' 3.5" (1.918 m)    Wt 209 lb 12.8 oz (95.2 kg)    SpO2 97%    BMI 25.88 kg/m  , BMI Body mass index is 25.88 kg/m.  Wt Readings from Last 3 Encounters:  07/20/21 209 lb 12.8 oz (95.2 kg)  05/26/21 206 lb (93.4 kg)  03/11/21 209 lb 14.1 oz (95.2 kg)     GEN: Well nourished, well developed, in no acute distress. HEENT: normal. Neck: Supple, no JVD, carotid bruits, or masses. Cardiac: RRR, no murmurs, rubs, or gallops. No clubbing, cyanosis, edema.  Radials/PT 2+ and equal bilaterally.  Respiratory:  Respirations regular and unlabored, clear to auscultation bilaterally. GI: Soft, nontender, nondistended. MS: No deformity or atrophy. Skin: Warm and dry, no rash. Neuro:  Strength and sensation are intact. Psych: Normal affect.  Assessment & Plan    Aortic valve disease -CTA showed bicuspid valve  Thoracic aortic aneurysm without rupture -CTA shows slight growth of the ascending thoracic aorta.  Aortic root is mildly dilated and measures 4.0 to 4.3 cm at the sinus of valsalva.  Ascending thoracic aorta measures 3.6 to 3.8 cm -Important to keep BP tightly controlled -CTA already ordered for Feb 2023. We will try to arrange at [facility] which is closer to his house -Avoid fluoroquinolones  Essential hypertension -BP today is 140/80 -usually closer to 130/60-80 at home -Continue daily BP readings for 2 weeks and please send these values to me via MyChart  Hyperlipidemia -Lipid panel 02/2021 showed total cholesterol 133, HDL 47, LDL 71, and triglycerides 77 -Continue Crestor 5 mg daily -Will be due for lipid panel 02/2022  Aortic  atherosclerosis -Continue Crestor 5mg  daily    Disposition: Follow up 1 year with Belva Crome III,  MD or APP.  Signed, Elgie Collard, PA-C 07/20/2021, 11:30 AM [place]

## 2021-07-20 ENCOUNTER — Encounter: Payer: Self-pay | Admitting: Physician Assistant

## 2021-07-20 ENCOUNTER — Other Ambulatory Visit: Payer: Self-pay

## 2021-07-20 ENCOUNTER — Ambulatory Visit: Payer: 59 | Admitting: Physician Assistant

## 2021-07-20 VITALS — BP 140/80 | HR 65 | Ht 75.5 in | Wt 209.8 lb

## 2021-07-20 DIAGNOSIS — I700 Atherosclerosis of aorta: Secondary | ICD-10-CM | POA: Diagnosis not present

## 2021-07-20 DIAGNOSIS — E782 Mixed hyperlipidemia: Secondary | ICD-10-CM | POA: Diagnosis not present

## 2021-07-20 DIAGNOSIS — I7121 Aneurysm of the ascending aorta, without rupture: Secondary | ICD-10-CM | POA: Diagnosis not present

## 2021-07-20 DIAGNOSIS — Q231 Congenital insufficiency of aortic valve: Secondary | ICD-10-CM | POA: Diagnosis not present

## 2021-07-20 DIAGNOSIS — I10 Essential (primary) hypertension: Secondary | ICD-10-CM

## 2021-07-20 NOTE — Patient Instructions (Signed)
Medication Instructions:   Your physician recommends that you continue on your current medications as directed. Please refer to the Current Medication list given to you today.  *If you need a refill on your cardiac medications before your next appointment, please call your pharmacy*   Lab Work:  None ordered.  If you have labs (blood work) drawn today and your tests are completely normal, you will receive your results only by: [place] (if you have MyChart) OR A paper copy in the mail If you have any lab test that is abnormal or we need to change your treatment, we will call you to review the results.   Testing/Procedures:  Keep appointment for CT of Aorta.    Follow-Up: At [facility], you and your health needs are our priority.  As part of our continuing mission to provide you with exceptional heart care, we have created designated Provider Care Teams.  These Care Teams include your primary Cardiologist (physician) and Advanced Practice Providers (APPs -  Physician Assistants and Nurse Practitioners) who all work together to provide you with the care you need, when you need it.  We recommend signing up for the patient portal called "MyChart".  Sign up information is provided on this After Visit Summary.  MyChart is used to connect with patients for Virtual Visits (Telemedicine).  Patients are able to view lab/test results, encounter notes, upcoming appointments, etc.  Non-urgent messages can be sent to your provider as well.   To learn more about what you can do with MyChart, go to [email or web address].    Your next appointment:   1 year(s)  The format for your next appointment:   In Person  Provider:   Sinclair Grooms, MD     Other Instructions  Your physician wants you to follow-up in: 1 year with Dr. Tamala Julian.  You will receive a reminder letter in the mail two months in advance. If you don't receive a letter, please call our office to schedule the follow-up  appointment.  Please send in blood pressure readings in X 2 weeks via mychart.   Low-Sodium Eating Plan Sodium, which is an element that makes up salt, helps you maintain a healthy balance of fluids in your body. Too much sodium can increase your blood pressure and cause fluid and waste to be held in your body. Your health care provider or dietitian may recommend following this plan if you have high blood pressure (hypertension), kidney disease, liver disease, or heart failure. Eating less sodium can help lower your blood pressure, reduce swelling, and protect your heart, liver, and kidneys. What are tips for following this plan? Reading food labels The Nutrition Facts label lists the amount of sodium in one serving of the food. If you eat more than one serving, you must multiply the listed amount of sodium by the number of servings. Choose foods with less than 140 mg of sodium per serving. Avoid foods with 300 mg of sodium or more per serving. Shopping  Look for lower-sodium products, often labeled as "low-sodium" or "no salt added." Always check the sodium content, even if foods are labeled as "unsalted" or "no salt added." Buy fresh foods. Avoid canned foods and pre-made or frozen meals. Avoid canned, cured, or processed meats. Buy breads that have less than 80 mg of sodium per slice. Cooking  Eat more home-cooked food and less restaurant, buffet, and fast food. Avoid adding salt when cooking. Use salt-free seasonings or herbs instead of table salt or  sea salt. Check with your health care provider or pharmacist before using salt substitutes. Cook with plant-based oils, such as canola, sunflower, or olive oil. Meal planning When eating at a restaurant, ask that your food be prepared with less salt or no salt, if possible. Avoid dishes labeled as brined, pickled, cured, smoked, or made with soy sauce, miso, or teriyaki sauce. Avoid foods that contain MSG (monosodium glutamate). MSG is  sometimes added to [place] food, bouillon, and some canned foods. Make meals that can be grilled, baked, poached, roasted, or steamed. These are generally made with less sodium. General information Most people on this plan should limit their sodium intake to 1,500-2,000 mg (milligrams) of sodium each day. What foods should I eat? Fruits Fresh, frozen, or canned fruit. Fruit juice. Vegetables Fresh or frozen vegetables. "No salt added" canned vegetables. "No salt added" tomato sauce and paste. Low-sodium or reduced-sodium tomato and vegetable juice. Grains Low-sodium cereals, including oats, puffed wheat and rice, and shredded wheat. Low-sodium crackers. Unsalted rice. Unsalted pasta. Low-sodium bread. Whole-grain breads and whole-grain pasta. Meats and other proteins Fresh or frozen (no salt added) meat, poultry, seafood, and fish. Low-sodium canned tuna and salmon. Unsalted nuts. Dried peas, beans, and lentils without added salt. Unsalted canned beans. Eggs. Unsalted nut butters. Dairy Milk. Soy milk. Cheese that is naturally low in sodium, such as ricotta cheese, fresh mozzarella, or Swiss cheese. Low-sodium or reduced-sodium cheese. Cream cheese. Yogurt. Seasonings and condiments Fresh and dried herbs and spices. Salt-free seasonings. Low-sodium mustard and ketchup. Sodium-free salad dressing. Sodium-free light mayonnaise. Fresh or refrigerated horseradish. Lemon juice. Vinegar. Other foods Homemade, reduced-sodium, or low-sodium soups. Unsalted popcorn and pretzels. Low-salt or salt-free chips. The items listed above may not be a complete list of foods and beverages you can eat. Contact a dietitian for more information. What foods should I avoid? Vegetables Sauerkraut, pickled vegetables, and relishes. Olives. [place] fries. Onion rings. Regular canned vegetables (not low-sodium or reduced-sodium). Regular canned tomato sauce and paste (not low-sodium or reduced-sodium). Regular tomato and  vegetable juice (not low-sodium or reduced-sodium). Frozen vegetables in sauces. Grains Instant hot cereals. Bread stuffing, pancake, and biscuit mixes. Croutons. Seasoned rice or pasta mixes. Noodle soup cups. Boxed or frozen macaroni and cheese. Regular salted crackers. Self-rising flour. Meats and other proteins Meat or fish that is salted, canned, smoked, spiced, or pickled. Precooked or cured meat, such as sausages or meat loaves. [place] Salines. Ham. Pepperoni. Hot dogs. Corned beef. Chipped beef. Salt pork. Jerky. Pickled herring. Anchovies and sardines. Regular canned tuna. Salted nuts. Dairy Processed cheese and cheese spreads. Hard cheeses. Cheese curds. Blue cheese. Feta cheese. String cheese. Regular cottage cheese. Buttermilk. Canned milk. Fats and oils Salted butter. Regular margarine. Ghee. Bacon fat. Seasonings and condiments Onion salt, garlic salt, seasoned salt, table salt, and sea salt. Canned and packaged gravies. Worcestershire sauce. Tartar sauce. Barbecue sauce. Teriyaki sauce. Soy sauce, including reduced-sodium. Steak sauce. Fish sauce. Oyster sauce. Cocktail sauce. Horseradish that you find on the shelf. Regular ketchup and mustard. Meat flavorings and tenderizers. Bouillon cubes. Hot sauce. Pre-made or packaged marinades. Pre-made or packaged taco seasonings. Relishes. Regular salad dressings. Salsa. Other foods Salted popcorn and pretzels. Corn chips and puffs. Potato and tortilla chips. Canned or dried soups. Pizza. Frozen entrees and pot pies. The items listed above may not be a complete list of foods and beverages you should avoid. Contact a dietitian for more information. Summary Eating less sodium can help lower your blood pressure, reduce swelling, and  protect your heart, liver, and kidneys. Most people on this plan should limit their sodium intake to 1,500-2,000 mg (milligrams) of sodium each day. Canned, boxed, and frozen foods are high in sodium. Restaurant foods, fast  foods, and pizza are also very high in sodium. You also get sodium by adding salt to food. Try to cook at home, eat more fresh fruits and vegetables, and eat less fast food and canned, processed, or prepared foods. This information is not intended to replace advice given to you by your health care provider. Make sure you discuss any questions you have with your health care provider. Document Revised: 07/12/2019 Document Reviewed: 05/08/2019 Elsevier Patient Education  2022 [place] Heart-healthy meal planning includes: Eating less unhealthy fats. Eating more healthy fats. Making other changes in your diet. Talk with your doctor or a diet specialist (dietitian) to create an eating plan that is right for you. What is my plan? Your doctor may recommend an eating plan that includes: Total fat: ______% or less of total calories a day. Saturated fat: ______% or less of total calories a day. Cholesterol: less than _________mg a day. What are tips for following this plan? Cooking Avoid frying your food. Try to bake, boil, grill, or broil it instead. You can also reduce fat by: Removing the skin from poultry. Removing all visible fats from meats. Steaming vegetables in water or broth. Meal planning  At meals, divide your plate into four equal parts: Fill one-half of your plate with vegetables and green salads. Fill one-fourth of your plate with whole grains. Fill one-fourth of your plate with lean protein foods. Eat 4-5 servings of vegetables per day. A serving of vegetables is: 1 cup of raw or cooked vegetables. 2 cups of raw leafy greens. Eat 4-5 servings of fruit per day. A serving of fruit is: 1 medium whole fruit.  cup of dried fruit.  cup of fresh, frozen, or canned fruit.  cup of 100% fruit juice. Eat more foods that have soluble fiber. These are apples, broccoli, carrots, beans, peas, and barley. Try to get 20-30 g of fiber per day. Eat 4-5  servings of nuts, legumes, and seeds per week: 1 serving of dried beans or legumes equals  cup after being cooked. 1 serving of nuts is  cup. 1 serving of seeds equals 1 tablespoon. General information Eat more home-cooked food. Eat less restaurant, buffet, and fast food. Limit or avoid alcohol. Limit foods that are high in starch and sugar. Avoid fried foods. Lose weight if you are overweight. Keep track of how much salt (sodium) you eat. This is important if you have high blood pressure. Ask your doctor to tell you more about this. Try to add vegetarian meals each week. Fats Choose healthy fats. These include olive oil and canola oil, flaxseeds, walnuts, almonds, and seeds. Eat more omega-3 fats. These include salmon, mackerel, sardines, tuna, flaxseed oil, and ground flaxseeds. Try to eat fish at least 2 times each week. Check food labels. Avoid foods with trans fats or high amounts of saturated fat. Limit saturated fats. These are often found in animal products, such as meats, butter, and cream. These are also found in plant foods, such as palm oil, palm kernel oil, and coconut oil. Avoid foods with partially hydrogenated oils in them. These have trans fats. Examples are stick margarine, some tub margarines, cookies, crackers, and other baked goods. What foods can I eat? Fruits All fresh, canned (in natural juice),  or frozen fruits. Vegetables Fresh or frozen vegetables (raw, steamed, roasted, or grilled). Green salads. Grains Most grains. Choose whole wheat and whole grains most of the time. Rice and pasta, including brown rice and pastas made with whole wheat. Meats and other proteins Lean, well-trimmed beef, veal, pork, and lamb. Chicken and [place] without skin. All fish and shellfish. Wild duck, rabbit, pheasant, and venison. Egg whites or low-cholesterol egg substitutes. Dried beans, peas, lentils, and tofu. Seeds and most nuts. Dairy Low-fat or nonfat cheeses, including  ricotta and mozzarella. Skim or 1% milk that is liquid, powdered, or evaporated. Buttermilk that is made with low-fat milk. Nonfat or low-fat yogurt. Fats and oils Non-hydrogenated (trans-free) margarines. Vegetable oils, including soybean, sesame, sunflower, olive, peanut, safflower, corn, canola, and cottonseed. Salad dressings or mayonnaise made with a vegetable oil. Beverages Mineral water. Coffee and tea. Diet carbonated beverages. Sweets and desserts Sherbet, gelatin, and fruit ice. Small amounts of dark chocolate. Limit all sweets and desserts. Seasonings and condiments All seasonings and condiments. The items listed above may not be a complete list of foods and drinks you can eat. Contact a dietitian for more options. What foods should I avoid? Fruits Canned fruit in heavy syrup. Fruit in cream or butter sauce. Fried fruit. Limit coconut. Vegetables Vegetables cooked in cheese, cream, or butter sauce. Fried vegetables. Grains Breads that are made with saturated or trans fats, oils, or whole milk. Croissants. Sweet rolls. Donuts. High-fat crackers, such as cheese crackers. Meats and other proteins Fatty meats, such as hot dogs, ribs, sausage, bacon, rib-eye roast or steak. High-fat deli meats, such as salami and bologna. Caviar. Domestic duck and goose. Organ meats, such as liver. Dairy Cream, sour cream, cream cheese, and creamed cottage cheese. Whole-milk cheeses. Whole or 2% milk that is liquid, evaporated, or condensed. Whole buttermilk. Cream sauce or high-fat cheese sauce. Yogurt that is made from whole milk. Fats and oils Meat fat, or shortening. Cocoa butter, hydrogenated oils, palm oil, coconut oil, palm kernel oil. Solid fats and shortenings, including bacon fat, salt pork, lard, and butter. Nondairy cream substitutes. Salad dressings with cheese or sour cream. Beverages Regular sodas and juice drinks with added sugar. Sweets and desserts Frosting. Pudding. Cookies. Cakes.  Pies. Milk chocolate or white chocolate. Buttered syrups. Full-fat ice cream or ice cream drinks. The items listed above may not be a complete list of foods and drinks to avoid. Contact a dietitian for more information. Summary Heart-healthy meal planning includes eating less unhealthy fats, eating more healthy fats, and making other changes in your diet. Eat a balanced diet. This includes fruits and vegetables, low-fat or nonfat dairy, lean protein, nuts and legumes, whole grains, and heart-healthy oils and fats. This information is not intended to replace advice given to you by your health care provider. Make sure you discuss any questions you have with your health care provider. Document Revised: 10/15/2020 Document Reviewed: 10/15/2020 Elsevier Patient Education  2022 [employer].

## 2021-07-21 ENCOUNTER — Ambulatory Visit (INDEPENDENT_AMBULATORY_CARE_PROVIDER_SITE_OTHER): Payer: 59 | Admitting: Urology

## 2021-07-21 ENCOUNTER — Encounter: Payer: Self-pay | Admitting: Urology

## 2021-07-21 VITALS — BP 126/80 | HR 69

## 2021-07-21 DIAGNOSIS — N486 Induration penis plastica: Secondary | ICD-10-CM

## 2021-07-21 NOTE — Progress Notes (Signed)
Urological Symptom Review  Patient is experiencing the following symptoms: Get up at night to urinate   Review of Systems  Gastrointestinal (upper)  : Negative for upper GI symptoms  Gastrointestinal (lower) : Negative for lower GI symptoms  Constitutional : Negative for symptoms  Skin: Negative for skin symptoms  Eyes: Negative for eye symptoms  Ear/Nose/Throat : Sinus problems  Hematologic/Lymphatic: Easy bruising  Cardiovascular : Negative for cardiovascular symptoms  Respiratory : Negative for respiratory symptoms  Endocrine: Negative for endocrine symptoms  Musculoskeletal: Back pain  Neurological: Headaches  Psychologic: Negative for psychiatric symptoms

## 2021-07-21 NOTE — Progress Notes (Signed)
07/21/2021 9:57 AM   Marvin Munoz 12/22/1960 [id number]  Referring provider: Josetta Huddle, MD [place],  [place] [address]  Followup peyronies disease   HPI: Marvin Munoz is a 61yo here for followup for peyronies disease. He has noted no change in his curvature in 12 months. He is currently on trental 400mg  BID. NO pain with erections. Based on pictures provided by the patient he has 45-50 degree left dorsal curvature at the mid penile shaft. He has a palpable 1.5cm plaque at the mid penile shaft.    PMH: Past Medical History:  Diagnosis Date   Allergic rhinitis    Eczema    Fever blister    occasional   Foot drop, right 09/24/2014   Gastroesophageal reflux    Gout    05-09-2018 per pt last flare up, 2014   Guillain Barr syndrome ([place]) 1970's   per pt received swine flu vaccination and regular flu vaccination at same time, was told one or the other may have caused guillain barre   HTN (hypertension)    Migraine headache    Mild aortic valve stenosis cardiologist-  dr Daneen Schick   per last echo  02-01-2017   moderately thickended and calcificed leaflets with mild AS and mild AR,  valve area 2.43cm^2   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 Tamala Julian--- per last CTA 02-21-2018 , 32mm    Surgical History: Past Surgical History:  Procedure Laterality Date   BREAST LUMPECTOMY Right 1990s   "benign"   CARDIAC CATHETERIZATION  09/10/2015   dr Bonnita Nasuti pre[place]  @[facility]    normal coronaries and LVF,  false positive cardiolite   COLONOSCOPY     KNEE ARTHROSCOPY WITH MEDIAL  MENISECTOMY Left 05/15/2018   Procedure: LEFT KNEE ARTHROSCOPY WITH MEDIAL MENISECTOMY;  Surgeon: Marchia Bond, MD;  Location: [facility];  Service: Orthopedics;  Laterality: Left;   REPLACEMENT UNICONDYLAR JOINT KNEE Left 09/10/2015    dr Mardelle Matte  @[facility]    TENDON RECONSTRUCTION Right 06/02/2016   Procedure: right elbow extensor tendon repair and debridement and repair lateral collateral ligament;  Surgeon: Ninetta Lights, MD;  Location: [facility];  Service: Orthopedics;  Laterality: Right;   TONSILLECTOMY  child   TOTAL KNEE ARTHROPLASTY WITH REVISION COMPONENTS Left 05/15/2018   Procedure: conversion to LEFT total KNEE ARTHROPLASTY with hardware removal;  Surgeon: Marchia Bond, MD;  Location: [facility];  Service: Orthopedics;  Laterality: Left;  with block   TRANSTHORACIC ECHOCARDIOGRAM  02/01/2017   mild LVH,  ef 10-62%, Grade 2 diastolic dysfucntion/  moderately thickened moderately calcified AV leafleat with mild stenosis and moderate regurg. (valve area 2.43cm^2)/  mild to moderate calcified MV annulus with triv. regurg/ trivial TR/  m;ild RAE    Home Medications:  Allergies as of 07/21/2021       Reactions   Influenza Vaccines Other (See Comments)   Guillain-barre syndrome   Covid-19 (mrna) Vaccine Other (See Comments)   Adhesive [tape] Rash   Colchicine Diarrhea  Medication List        Accurate as of July 21, 2021  9:57 AM. If you have any questions, ask your nurse or doctor.          allopurinol 300 MG tablet Commonly known as: ZYLOPRIM Take 150 mg by mouth every evening.   aspirin EC 81 MG tablet Take 81 mg by mouth daily.   baclofen 10 MG tablet Commonly known as: LIORESAL Take 1 tablet (10 mg total) by mouth 3 (three) times daily. As needed for muscle spasm   CALCIUM + D3 PO Take 1 tablet by mouth daily.   Calcium 600 1500 (600 Ca) MG Tabs tablet Generic drug: calcium carbonate SMARTSIG:1 By Mouth   cetirizine 10 MG tablet Commonly  known as: ZYRTEC Take 10 mg by mouth every morning.   diltiazem 180 MG 24 hr capsule Commonly known as: CARDIZEM CD Take 180 mg by mouth daily.   FIBER PO Take 1 capsule by mouth daily.   fluticasone 50 MCG/ACT nasal spray Commonly known as: FLONASE Place 1 spray into both nostrils daily.   multivitamin tablet Take 1 tablet by mouth daily.   omeprazole 40 MG capsule Commonly known as: PRILOSEC Take 40 mg by mouth every morning.   pentoxifylline 400 MG CR tablet Commonly known as: TRENTAL Take 1 tablet (400 mg total) by mouth 2 (two) times daily.   rizatriptan 10 MG disintegrating tablet Commonly known as: MAXALT-MLT Take 10 mg by mouth as needed for migraine. May repeat in 2 hours if needed   rOPINIRole 1 MG tablet Commonly known as: REQUIP Take 2 mg by mouth daily.   rosuvastatin 5 MG tablet Commonly known as: CRESTOR TAKE (1) TABLET BY MOUTH ONCE DAILY.   sulfamethoxazole-trimethoprim 800-160 MG tablet Commonly known as: BACTRIM DS Take 1 tablet by mouth 3 (three) times a week.   valACYclovir 1000 MG tablet Commonly known as: VALTREX Take 1,000 mg by mouth daily as needed (for cold sores).   zolpidem 10 MG tablet Commonly known as: AMBIEN Take 10 mg by mouth as needed.        Allergies:  Allergies  Allergen Reactions   Influenza Vaccines Other (See Comments)    Guillain-barre syndrome   Covid-19 (Mrna) Vaccine Other (See Comments)   Adhesive [Tape] Rash   Colchicine Diarrhea    Family History: Family History  Problem Relation Age of Onset   Heart attack Father    Prostate cancer Father    COPD Mother        smoked   Asthma Mother    Arthritis/Rheumatoid Mother    Leukemia Maternal Grandfather    Prostate cancer Maternal Grandfather    Prostate cancer Maternal Uncle     Social History:  reports that he has never smoked. He has never used smokeless tobacco. He reports that he does not currently use alcohol. He reports that he does not use  drugs.  ROS: All other review of systems were reviewed and are negative except what is noted above in HPI  Physical Exam: BP 126/80    Pulse 69   Constitutional:  Alert and oriented, No acute distress. HEENT: [place] AT, moist mucus membranes.  Trachea midline, no masses. Cardiovascular: No clubbing, cyanosis, or edema. Respiratory: Normal respiratory effort, no increased work of breathing. GI: Abdomen is soft, nontender, nondistended, no abdominal masses GU: No CVA tenderness.  Lymph: No cervical or inguinal lymphadenopathy. Skin: No rashes, bruises or suspicious lesions. Neurologic: Grossly intact, no focal deficits, moving all  4 extremities. Psychiatric: Normal mood and affect.  Laboratory Data: Lab Results  Component Value Date   WBC 4.0 09/22/2020   HGB 13.0 09/22/2020   HCT 38.6 (L) 09/22/2020   MCV 93.4 09/22/2020   PLT 219.0 09/22/2020    Lab Results  Component Value Date   CREATININE 1.12 07/21/2020    No results found for: PSA  No results found for: TESTOSTERONE  No results found for: HGBA1C  Urinalysis No results found for: COLORURINE, APPEARANCEUR, LABSPEC, PHURINE, GLUCOSEU, HGBUR, BILIRUBINUR, KETONESUR, PROTEINUR, UROBILINOGEN, NITRITE, LEUKOCYTESUR  No results found for: LABMICR, [place], RBCUA, LABEPIT, MUCUS, BACTERIA  Pertinent Imaging:  No results found for this or any previous visit.  No results found for this or any previous visit.  No results found for this or any previous visit.  No results found for this or any previous visit.  No results found for this or any previous visit.  No results found for this or any previous visit.  No results found for this or any previous visit.  No results found for this or any previous visit.   Assessment & Plan:    1. Peyronie disease We discussed the management of peyronies disease including medical therapy, penile plication, verapamil therapy and xiaflex therapy. After discussed the options the  patient is undecided. He will call with his decision.    No follow-ups on file.  Nicolette Bang, MD  [facility] [place]

## 2021-07-21 NOTE — Patient Instructions (Signed)
Collagenase injection (Dupuytren's Contracture/Peyronie's Disease) What is this medication? COLLAGENASE (kohl LAH jen ace) is used to treat Dupuytren's contracture. This medicine may help straighten a bent finger by breaking up hard tissue. It is also used for Peyronie's disease by breaking up the hard tissue plaque that causes the curvature in the penis. This medicine may be used for other purposes; ask your health care provider or pharmacist if you have questions. COMMON BRAND NAME(S): Xiaflex What should I tell my care team before I take this medication? They need to know if you have any of these conditions: hemophilia low platelet counts take medicines that treat or prevent blood clots an unusual or allergic reaction to collagenase, other medicines, foods, dyes, or preservatives pregnant or trying to get pregnant breast-feeding How should I use this medication? This medicine is for injection into the hand or penis. It is given by a health care professional in a hospital or clinic setting. A special MedGuide will be given to you by the pharmacist with each prescription and refill. Be sure to read this information carefully each time. Talk to your pediatrician regarding the use of this medicine in children. Special care may be needed. Overdosage: If you think you have taken too much of this medicine contact a poison control center or emergency room at once. NOTE: This medicine is only for you. Do not share this medicine with others. What if I miss a dose? It is important not to miss your dose. Call your doctor or health care professional if you are unable to keep an appointment. What may interact with this medication? aspirin and aspirin-like medicines certain medicines that treat or prevent blood clots like warfarin, enoxaparin, and dalteparin This list may not describe all possible interactions. Give your health care provider a list of all the medicines, herbs, non-prescription drugs, or  dietary supplements you use. Also tell them if you smoke, drink alcohol, or use illegal drugs. Some items may interact with your medicine. What should I watch for while using this medication? Your condition will be monitored carefully while you are receiving this medicine. If being treated for Dupuytren's contracture, return to your healthcare provider the day after your hand is injected. In the meantime, do not flex or extend the fingers of your hand that was injected. Do not touch your finger that was injected, and elevate your hand until bedtime. Do not perform activity with the injected hand until you are told that it is OK. Follow any instructions about wearing a splint or performing finger exercises. Also, call your healthcare provider if you get increasing redness or swelling in the hand, if you have numbness or tingling in the treated finger, or if you have trouble bending the finger after the swelling goes down. If being treated for Peyronie's disease, you will need to return to your healthcare provider for a manual procedure that will stretch and help straighten your penis. Also, your healthcare provider will show you how to gently stretch your penis at home. Do not resume sexual activity until you are told that it is okay. Follow instructions on when to return for follow-up visits. Immediately call your doctor if you have trouble stretching or straightening your penis, or if you have pain or other concerns. Immediately call your healthcare provider if you get a fever or chills. What side effects may I notice from receiving this medication? Side effects that you should report to your doctor or health care professional as soon as possible: allergic reactions like  skin rash, itching or hives, swelling of the face, lips, or tongue chest pain or palpitations infection (fever, chills, increasing redness or swelling at treated site) numbness or tingling in a treated hand popping sound or sensation in  an erect penis sudden back pain or trouble walking sudden loss of the ability to maintain an erection trouble breathing trouble urinating or blood in the urine unusual pain, swelling, or bruising of the penis Side effects that usually do not require medical attention (report to your doctor or health care professional if they continue or are bothersome): bleeding or bruising at site where injected irritation at site where injected mild pain or swelling at site where injected This list may not describe all possible side effects. Call your doctor for medical advice about side effects. You may report side effects to FDA at 1-800-FDA-1088. Where should I keep my medication? This drug is given in a hospital or clinic and will not be stored at home. NOTE: This sheet is a summary. It may not cover all possible information. If you have questions about this medicine, talk to your doctor, pharmacist, or health care provider.  2022 Elsevier/Gold Standard (2019-10-05 00:00:00)

## 2021-07-23 NOTE — Addendum Note (Signed)
Addended by: Briant Cedar on: 07/23/2021 12:34 PM   Modules accepted: Orders

## 2021-08-04 ENCOUNTER — Telehealth: Payer: Self-pay | Admitting: *Deleted

## 2021-08-04 NOTE — Telephone Encounter (Signed)
° °  Pre-operative Risk Assessment    Patient Name: Marvin Munoz  DOB: June 18, 1961 MRN: [id number]      Request for Surgical Clearance    Procedure:   RIGHT MIDDLE TRIGGER FINGER RELEASE  Date of Surgery:  Clearance 09/16/21                                 Surgeon:  DR. Marchia Bond Surgeon's Group or Practice Name:  [facility] Phone number:  [phone] EXT [address] [facility] Fax number:  [phone]   Type of Clearance Requested:   - Medical  - Pharmacy:  Hold Aspirin     Type of Anesthesia:   CHOICE   Additional requests/questions:    [name]   08/04/2021, 10:44 AM

## 2021-08-04 NOTE — Telephone Encounter (Addendum)
° °  Name: Marvin Munoz  DOB: 13-Jul-1960  MRN: [id number]   Primary Cardiologist: Sinclair Grooms, MD  Chart reviewed as part of pre-operative protocol coverage. NICHOALS HEYDE was last seen 07/20/21, history reviewed, primarily followed for bicuspid AV with mild-moderate AI, thoracic aortic aneurysm, HTN, HLD, and aortic atherosclerosis. CTA planned for 08/17/21. Patient's last echo was in 2020, do not see updated study since that time. Given bicuspid AV and mild-moderate range AI, will route to Nicholes Rough for input whether it would be helpful to have repeat echocardiogram to assess severity of aortic insufficiency or whether he may be cleared for procedure below without further testing. Regarding ASA, do not anticipate problem holding based on history. Tessa - Please route response to P [facility] PREOP (the pre-op pool). Thank you.  Preliminarily - Regarding ASA, do not anticipate problem holding based on history but need to review overall clearance above first.  Charlie Pitter, PA-C 08/04/2021, 1:30 PM

## 2021-08-04 NOTE — Telephone Encounter (Signed)
° °  Patient Name: Marvin Munoz  DOB: 1960/07/15 MRN: [id number]  Primary Cardiologist: Sinclair Grooms, MD  Chart revisited as part of pre-operative protocol coverage.  Per Nicholes Rough, PA-C, who recently evaluated patient, she recommends patient have his CT angio (2/28) to assess his aorta. If his aneurysm is growing he would need a follow-up echocardiogram to evaluate his aortic valve before clearance can be obtained.   Will route this update to requesting provider via Epic fax function. Preop team will follow for CTA results and further recommendations.  Charlie Pitter, PA-C 08/04/2021, 2:20 PM

## 2021-08-11 ENCOUNTER — Other Ambulatory Visit: Payer: 59 | Admitting: *Deleted

## 2021-08-11 ENCOUNTER — Other Ambulatory Visit: Payer: Self-pay

## 2021-08-11 DIAGNOSIS — I712 Thoracic aortic aneurysm, without rupture, unspecified: Secondary | ICD-10-CM

## 2021-08-11 LAB — BASIC METABOLIC PANEL
BUN/Creatinine Ratio: 14 (ref 10–24)
BUN: 16 mg/dL (ref 8–27)
CO2: 25 mmol/L (ref 20–29)
Calcium: 9.4 mg/dL (ref 8.6–10.2)
Chloride: 103 mmol/L (ref 96–106)
Creatinine, Ser: 1.12 mg/dL (ref 0.76–1.27)
Glucose: 170 mg/dL — ABNORMAL HIGH (ref 70–99)
Potassium: 4.4 mmol/L (ref 3.5–5.2)
Sodium: 141 mmol/L (ref 134–144)
eGFR: 75 mL/min/{1.73_m2} (ref >59)

## 2021-08-17 ENCOUNTER — Ambulatory Visit (INDEPENDENT_AMBULATORY_CARE_PROVIDER_SITE_OTHER)
Admission: RE | Admit: 2021-08-17 | Discharge: 2021-08-17 | Disposition: A | Discharge status: Home / Self Care | Payer: 59 | Source: Ambulatory Visit | Attending: Interventional Cardiology | Admitting: Interventional Cardiology

## 2021-08-17 ENCOUNTER — Other Ambulatory Visit: Payer: Self-pay

## 2021-08-17 DIAGNOSIS — I712 Thoracic aortic aneurysm, without rupture, unspecified: Secondary | ICD-10-CM

## 2021-08-17 MED ORDER — IOHEXOL 350 MG/ML SOLN
100.0000 mL | Freq: Once | INTRAVENOUS | Status: AC | PRN
  Administered 2021-08-17: 100 mL via INTRAVENOUS

## 2021-08-19 NOTE — Telephone Encounter (Signed)
? ?  Primary Cardiologist: Sinclair Grooms, MD ? ?Chart reviewed as part of pre-operative protocol coverage. Given past medical history and time since last visit, based on ACC/AHA guidelines, Marvin Munoz would be at acceptable risk for the planned procedure without further cardiovascular testing.  ? ?His aspirin may be held for 7 days prior to his procedure.  Please resume as soon as hemostasis is achieved. ? ?I will route this recommendation to the requesting party via Epic fax function and remove from pre-op pool. ? ?Please call with questions. ? ?[name]. [name] NP-C ? ?  ?08/19/2021, 10:24 AM ?[facility] ?[address] ?Office [phone] Fax [phone] ? ? ? ? ?

## 2021-08-30 ENCOUNTER — Encounter: Payer: Self-pay | Admitting: Pulmonary Disease

## 2021-08-30 ENCOUNTER — Other Ambulatory Visit: Payer: Self-pay

## 2021-08-30 ENCOUNTER — Ambulatory Visit: Payer: 59 | Admitting: Pulmonary Disease

## 2021-08-30 VITALS — BP 122/74 | HR 75 | Temp 97.7°F | Ht 75.5 in | Wt 201.0 lb

## 2021-08-30 DIAGNOSIS — U099 Post covid-19 condition, unspecified: Secondary | ICD-10-CM

## 2021-08-30 NOTE — Progress Notes (Signed)
? ?      ?Marvin Munoz    [id number]    [birth year] ? ?Primary Care Physician:Gates, Herbie [place], MD ? ?Referring Physician: Josetta Huddle, MD ?[address] ?[address] ?[place],  [place] [address] ? ?Chief complaint: Follow-up for post COVID-19 ? ?HPI: ?61 year old with mild asthma not on inhalers, thoracic aneurysm, aortic regurgitation ?Developed COVID-19 in February 2022.  He received monoclonal antibody therapy on 2/16.  Did not require hospitalization ?CTA done by cardiologist as routine for thoracic aneurysm showed bilateral infiltrates consistent with COVID-19.  Follow-up imaging shows persistent pulmonary infiltrates on chest x-ray ? ?He has persistent dyspnea on exertion, dry cough since then.  Treated with 2 rounds of prednisone for 7 days each and 2 rounds of antibiotics, given albuterol rescue inhaler without change.  Denies any fevers, chills, wheeze ?He has been tapered off prednisone in June 2022 ? ?Pets: No pets ?Occupation: Designer, industrial/product for [employer] ?Exposures: No mold, hot tub, Jacuzzi, no feather pillows or comforter ?Smoking history: Never smoker ?Travel history: No significant travel history ?Relevant family history: Mom had COPD.  She was a smoker. ? ?Interim history: ?Taken of Breo inhaler in September 2022 as there is no obstruction on PFTs ?Finished pulmonary rehab in late 2022 and its helped, nicely with his breathing ?He feels back to normal with minimal dyspnea on exertion ?Back to full-time work normal ? ?Outpatient Encounter Medications as of 08/30/2021  ?Medication Sig  ? allopurinol (ZYLOPRIM) 300 MG tablet Take 150 mg by mouth every evening.   ? aspirin EC 81 MG tablet Take 81 mg by mouth daily.  ? baclofen (LIORESAL) 10 MG tablet Take 1 tablet (10 mg total) by mouth 3 (three) times daily. As needed for muscle spasm  ? CALCIUM 600 1500 (600 Ca) MG TABS tablet SMARTSIG:1 By Mouth  ? Calcium Carb-Cholecalciferol (CALCIUM + D3 PO) Take 1 tablet by mouth daily.   ?  cetirizine (ZYRTEC) 10 MG tablet Take 10 mg by mouth every morning.   ? diltiazem (CARDIZEM CD) 180 MG 24 hr capsule Take 180 mg by mouth daily.  ? FIBER PO Take 1 capsule by mouth daily.  ? fluticasone (FLONASE) 50 MCG/ACT nasal spray Place 1 spray into both nostrils daily.  ? Multiple Vitamin (MULTIVITAMIN) tablet Take 1 tablet by mouth daily.  ? omeprazole (PRILOSEC) 40 MG capsule Take 40 mg by mouth every morning.   ? rizatriptan (MAXALT-MLT) 10 MG disintegrating tablet Take 10 mg by mouth as needed for migraine. May repeat in 2 hours if needed  ? rOPINIRole (REQUIP) 1 MG tablet Take 2 mg by mouth daily.  ? rosuvastatin (CRESTOR) 5 MG tablet TAKE (1) TABLET BY MOUTH ONCE DAILY.  ? sulfamethoxazole-trimethoprim (BACTRIM DS) 800-160 MG tablet Take 1 tablet by mouth 3 (three) times a week.  ? valACYclovir (VALTREX) 1000 MG tablet Take 1,000 mg by mouth daily as needed (for cold sores).   ? zolpidem (AMBIEN) 10 MG tablet Take 10 mg by mouth as needed.  ? [DISCONTINUED] pentoxifylline (TRENTAL) 400 MG CR tablet Take 1 tablet (400 mg total) by mouth 2 (two) times daily.  ? ?No facility-administered encounter medications on file as of 08/30/2021.  ? ?Physical Exam: ?Blood pressure 122/74, pulse 75, temperature 97.7 ?F (36.5 ?C), temperature source Oral, height 6' 3.5" (1.918 m), weight 201 lb (91.2 kg), SpO2 98 %. ?Gen:      No acute distress ?HEENT:  EOMI, sclera anicteric ?Neck:     No masses; no thyromegaly ?  Lungs:    Clear to auscultation bilaterally; normal respiratory effort ?CV:         Regular rate and rhythm; no murmurs ?Abd:      + bowel sounds; soft, non-tender; no palpable masses, no distension ?Ext:    No edema; adequate peripheral perfusion ?Skin:      Warm and dry; no rash ?Neuro: alert and oriented x 3 ?Psych: normal mood and affect  ? ?Data Reviewed: ?Imaging: ?CTA 08/17/2020-calcification of the aortic valve, multifocal peripheral groundglass opacities bilaterally. ?Chest x-ray 09/07/2020-persistent  bilateral infiltrates ?Chest x-ray 09/14/2020-slight improvement in lung infiltrate ?CTA 09/24/2020- no PE, improvement in bilateral groundglass opacities, coronary artery disease ?CT angio 08/17/2021-resolved groundglass opacities with minimal atelectasis. ?I have reviewed the images personally. ? ?PFTs: ?10/26/2020 ?FVC 4.81 [86%], FEV1 4.06 [96%], F/F 84, TLC 7.45 [94%], DLCO 25.71 [81%] ?Normal test ? ?Labs: ?CBC 09/22/2020-WBC 4, eos 1.8%, absolute eosinophil count 72 ?proBNP 09/23/2018 2-101 ?D-dimer 09/22/2020-1.65 ? ?Cardiac: ?Echocardiogram 04/15/2019-LVEF 60 to 65%, mild LVH normal RV size and function, mild to moderate aortic regurgitation ? ?Assessment:  ?Post COVID-19 ?No significant ILD on CTA and PFTs are normal.  His CT scan done last month for follow-up of thoracic aortic aneurysm shows resolution of minimal groundglass opacities ?Symptoms of dyspnea have improved with pulmonary rehab.  I have encouraged him to maintain an active lifestyle and exercise program ? ?Return to pulmonary clinic as needed ? ?Plan/Recommendations: ?Return to clinic as needed ? ?Marshell Garfinkel MD ?[facility] ?08/30/2021, 1:36 PM ? ?CC: Josetta Huddle, MD ? ? ?

## 2021-08-31 ENCOUNTER — Telehealth: Payer: Self-pay | Admitting: Urology

## 2021-08-31 NOTE — Telephone Encounter (Signed)
Returned call to patient- patient aware xiaflex treatments are done twice in a week and repeats every 6 weeks for up to 4 cycles. Patient voiced understanding. Will call back if he wishes to move forward with having prescription sent in.  ?

## 2021-08-31 NOTE — Telephone Encounter (Signed)
Patient called the office today looking for more information about the Xiaflex treatment cycles. ?

## 2021-10-28 ENCOUNTER — Other Ambulatory Visit: Payer: Self-pay | Admitting: Interventional Cardiology

## 2022-05-27 ENCOUNTER — Telehealth: Payer: Self-pay | Admitting: Interventional Cardiology

## 2022-05-27 DIAGNOSIS — I359 Nonrheumatic aortic valve disorder, unspecified: Secondary | ICD-10-CM

## 2022-05-27 DIAGNOSIS — Q231 Congenital insufficiency of aortic valve: Secondary | ICD-10-CM

## 2022-05-27 DIAGNOSIS — Q2381 Bicuspid aortic valve: Secondary | ICD-10-CM

## 2022-05-27 NOTE — Telephone Encounter (Signed)
Returned call to patient who  is asking about having his annual required testing before his appt in February with Dr.Skains. Message forwarded to Dr. Tamala Julian.    Georgana Curio MHA RN CCM

## 2022-05-27 NOTE — Telephone Encounter (Signed)
Pt is requesting call back to discuss possibly having tests and labs before his next appt. He has schedule his yearly with Dr. Marlou Porch 02/02, but would like to know if Dr. Tamala Julian believes he needs tests or labs before this next appt. Please advise.

## 2022-06-06 NOTE — Telephone Encounter (Signed)
Left message for patient informing him of Dr. Thompson Caul recommendation to have an echo performed prior to seeing Dr. Marlou Porch in February 2024.  Echo ordered, scheduler to call patient to setup appt date/time.  Provided office number for callback if any questions.

## 2022-06-06 NOTE — Addendum Note (Signed)
Addended by: Molli Barrows on: 06/06/2022 05:30 PM   Modules accepted: Orders

## 2022-07-11 ENCOUNTER — Ambulatory Visit (HOSPITAL_COMMUNITY): Payer: 59 | Attending: Interventional Cardiology

## 2022-07-11 DIAGNOSIS — I359 Nonrheumatic aortic valve disorder, unspecified: Secondary | ICD-10-CM | POA: Diagnosis present

## 2022-07-11 DIAGNOSIS — I350 Nonrheumatic aortic (valve) stenosis: Secondary | ICD-10-CM | POA: Diagnosis not present

## 2022-07-11 DIAGNOSIS — Q231 Congenital insufficiency of aortic valve: Secondary | ICD-10-CM | POA: Insufficient documentation

## 2022-07-11 DIAGNOSIS — I351 Nonrheumatic aortic (valve) insufficiency: Secondary | ICD-10-CM

## 2022-07-11 LAB — ECHOCARDIOGRAM COMPLETE
AR max vel: 3.79 cm2
AV Area VTI: 3.29 cm2
AV Area mean vel: 3.56 cm2
AV Mean grad: 6 mmHg
AV Peak grad: 11.4 mmHg
Ao pk vel: 1.69 m/s
Area-P 1/2: 2.68 cm2
P 1/2 time: 499 msec
S' Lateral: 3.6 cm

## 2022-07-22 ENCOUNTER — Encounter: Payer: Self-pay | Admitting: Cardiology

## 2022-07-22 ENCOUNTER — Encounter: Payer: Self-pay | Admitting: *Deleted

## 2022-07-22 ENCOUNTER — Ambulatory Visit: Payer: 59 | Attending: Cardiology | Admitting: Cardiology

## 2022-07-22 VITALS — BP 128/80 | HR 61 | Ht 75.0 in | Wt 220.6 lb

## 2022-07-22 DIAGNOSIS — I359 Nonrheumatic aortic valve disorder, unspecified: Secondary | ICD-10-CM

## 2022-07-22 DIAGNOSIS — I10 Essential (primary) hypertension: Secondary | ICD-10-CM | POA: Diagnosis not present

## 2022-07-22 NOTE — Patient Instructions (Signed)
Medication Instructions:  The current medical regimen is effective;  continue present plan and medications.  *If you need a refill on your cardiac medications before your next appointment, please call your pharmacy*   Lab Work: None today If you have labs (blood work) drawn today and your tests are completely normal, you will receive your results only by: [place] (if you have MyChart) OR A paper copy in the mail If you have any lab test that is abnormal or we need to change your treatment, we will call you to review the results.  Testing/Procedures: Your physician has requested that you have a TEE. During a TEE, sound waves are used to create images of your heart. It provides your doctor with information about the size and shape of your heart and how well your heart's chambers and valves are working. In this test, a transducer is attached to the end of a flexible tube that's guided down your throat and into your esophagus (the tube leading from you mouth to your stomach) to get a more detailed image of your heart. You are not awake for the procedure. Please see the instruction sheet given to you today. For further information please visit [email or web address].  Follow-Up: At [facility], you and your health needs are our priority.  As part of our continuing mission to provide you with exceptional heart care, we have created designated Provider Care Teams.  These Care Teams include your primary Cardiologist (physician) and Advanced Practice Providers (APPs -  Physician Assistants and Nurse Practitioners) who all work together to provide you with the care you need, when you need it.  We recommend signing up for the patient portal called "MyChart".  Sign up information is provided on this After Visit Summary.  MyChart is used to connect with patients for Virtual Visits (Telemedicine).  Patients are able to view lab/test results, encounter notes, upcoming appointments, etc.   Non-urgent messages can be sent to your provider as well.   To learn more about what you can do with MyChart, go to [email or web address].    Your next appointment:   6 month(s)  Provider:   Dr Candee Furbish

## 2022-07-22 NOTE — H&P (View-Only) (Signed)
Cardiology Office Note:    Date:  07/22/2022   ID:  Marvin Munoz, Marvin Munoz Marvin Munoz, Marvin Munoz Marvin Munoz  PCP:  Marvin Huddle, MD   Marvin Munoz Providers Cardiologist:  Marvin Furbish, MD     Referring MD: Marvin Huddle, MD    History of Present Illness:    Marvin Munoz is a 62 y.o. male with a hx of bicuspid aortic valve, ascending aortic aneurysm, aortic regurgitation, hyperlipidemia, and primary hypertension presents today for annual follow-up appointment.   He has been walking daily and also enjoys riding a bike. 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    Past Medical History:  Diagnosis Date   Allergic rhinitis    Eczema    Fever blister    occasional   Foot drop, right 09/24/2014   Gastroesophageal reflux    Gout    05-09-2018 per pt last flare up, 2014   Guillain Barr syndrome ([facility]) 1970's   per pt received swine flu vaccination and regular flu vaccination at same time, was told one or the other may have caused guillain barre   HTN (hypertension)    Migraine headache    Mild aortic valve stenosis cardiologist-  dr Daneen Schick   per last echo  02-01-2017   moderately thickended and calcificed leaflets with mild AS and mild AR,  valve area 2.43cm^2   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 Tamala Julian--- per last CTA 02-21-2018 , 25m    Past Surgical History:  Procedure Laterality Date   BREAST LUMPECTOMY Right 1990s   "benign"   CARDIAC CATHETERIZATION  09/10/2015   dr hBonnita Nasutipreston  '@[facility]'$     normal coronaries and LVF,  false positive cardiolite   COLONOSCOPY     KNEE ARTHROSCOPY WITH MEDIAL MENISECTOMY Left 05/15/2018   Procedure: LEFT KNEE ARTHROSCOPY WITH MEDIAL MENISECTOMY;  Surgeon: LMarchia Bond MD;  Location: [facility];  Service: Orthopedics;  Laterality: Left;   REPLACEMENT UNICONDYLAR JOINT KNEE Left 09/10/2015    dr lMardelle Matte '@[facility]    TENDON RECONSTRUCTION Right 06/02/2016   Procedure: right elbow extensor tendon repair and debridement and repair lateral collateral ligament;  Surgeon: DNinetta Lights MD;  Location: [facility]  Service: Orthopedics;  Laterality: Right;   TONSILLECTOMY  child   TOTAL KNEE ARTHROPLASTY WITH REVISION COMPONENTS Left 05/15/2018   Procedure: conversion to LEFT total KNEE ARTHROPLASTY with hardware removal;  Surgeon: LMarchia Bond MD;  Location: [facility];  Service: Orthopedics;  Laterality: Left;  with block   TRANSTHORACIC ECHOCARDIOGRAM  02/01/2017   mild LVH,  ef [id number] Grade 2 diastolic dysfucntion/  moderately thickened moderately calcified AV leafleat with mild stenosis and moderate regurg. (valve area 2.43cm^2)/  mild to moderate calcified MV annulus with triv. regurg/ trivial TR/  m;ild RAE    Current Medications: Current Meds  Medication Sig   allopurinol (ZYLOPRIM) 300 MG tablet Take 150 mg by mouth every evening.  aspirin EC 81 MG tablet Take 81 mg by mouth daily.   baclofen (LIORESAL) 10 MG tablet Take 1 tablet (10 mg total) by mouth 3 (three) times daily. As needed for muscle spasm   CALCIUM 600 1500 (600 Ca) MG TABS tablet SMARTSIG:1 By Mouth   Calcium Carb-Cholecalciferol (CALCIUM + D3 PO) Take 1 tablet by mouth daily.    cetirizine (ZYRTEC) 10 MG tablet Take 10 mg by mouth every morning.    diltiazem (CARDIZEM CD) 180 MG 24 hr capsule Take 180 mg by mouth daily.   FIBER PO Take 1 capsule by mouth daily.   fluticasone (FLONASE) 50 MCG/ACT nasal spray Place 1 spray into both nostrils daily.   Multiple Vitamin  (MULTIVITAMIN) tablet Take 1 tablet by mouth daily.   omeprazole (PRILOSEC) 40 MG capsule Take 40 mg by mouth every morning.    rizatriptan (MAXALT-MLT) 10 MG disintegrating tablet Take 10 mg by mouth as needed for migraine. May repeat in 2 hours if needed   rOPINIRole (REQUIP) 1 MG tablet Take 2 mg by mouth daily.   rosuvastatin (CRESTOR) 5 MG tablet TAKE (1) TABLET BY MOUTH ONCE DAILY.   sulfamethoxazole-trimethoprim (BACTRIM DS) 800-160 MG tablet Take 1 tablet by mouth 3 (three) times a week.   valACYclovir (VALTREX) 1000 MG tablet Take 1,000 mg by mouth daily as needed (for cold sores).    zolpidem (AMBIEN) 10 MG tablet Take 10 mg by mouth as needed.     Allergies:   Influenza vaccines, Covid-19 (mrna) vaccine, Adhesive [tape], and Colchicine   Social History   Socioeconomic History   Marital status: Married    Spouse name: Not on file   Number of children: 2   Years of education: college   Highest education level: Not on file  Occupational History   Occupation: Librarian, academic, Control and instrumentation engineer  Tobacco Use   Smoking status: Never   Smokeless tobacco: Never  Vaping Use   Vaping Use: Never used  Substance and Sexual Activity   Alcohol use: Not Currently    Alcohol/week: 0.0 standard drinks of alcohol   Drug use: Never   Sexual activity: Yes  Other Topics Concern   Not on file  Social History Narrative   Patient is right handed.   Patient does not drink caffeine.   Social Determinants of Health   Financial Resource Strain: Not on file  Food Insecurity: Not on file  Transportation Needs: Not on file  Physical Activity: Not on file  Stress: Not on file  Social Connections: Not on file     Family History: The patient's family history includes Arthritis/Rheumatoid in his mother; Asthma in his mother; COPD in his mother; Heart attack in his father; Leukemia in his maternal grandfather; Prostate cancer in his father, maternal grandfather, and maternal uncle.  ROS:    Please see the history of present illness.    No syncope bleeding orthopnea PND all other systems reviewed and are negative.  EKGs/Labs/Other Studies Reviewed:    The following studies were reviewed today: CT chest-aortic root 4 to 4.3 cm of the sinus Valsalva, ascending thoracic aorta normal at 3.6-3.8.  ECHO 07/11/22:   1. AoV poorly visualized but reported to be bicuspid. There is eccentric  aortic regurgitation that is moderate to severe. There is holodiastolic  flow reversal in the descending aorta, suggestive of severe aortic  regurgitation. Would recommend TEE or  cardiac MRI to further define severity of regurgitation. The aortic valve  has an indeterminant number of cusps.  Aortic valve regurgitation is  moderate to severe. Aortic valve sclerosis is present, with no evidence of  aortic valve stenosis.   2. Left ventricular ejection fraction, by estimation, is 55 to 60%. The  left ventricle has normal function. The left ventricle has no regional  wall motion abnormalities. Left ventricular diastolic parameters were  normal.   3. Right ventricular systolic function is normal. The right ventricular  size is normal. There is normal pulmonary artery systolic pressure. The  estimated right ventricular systolic pressure is [id number] mmHg.   4. The mitral valve is grossly normal. Trivial mitral valve  regurgitation. No evidence of mitral stenosis.   5. There is moderate dilatation of the ascending aorta, measuring 42 mm.   6. The inferior vena cava is normal in size with greater than 50%  respiratory variability, suggesting right atrial pressure of 3 mmHg.   Comparison(s): Changes from prior study are noted. Moderate to severe  aortic regurgitation is present.   EKG:  The ekg ordered today demonstrates  07/22/2022-sinus rhythm 61 newly discovered left bundle branch block QRS duration 150 ms.  Recent Labs: 08/11/2021: BUN 16; Creatinine, Ser 1.12; Potassium 4.4; Sodium 141  Recent  Lipid Panel    Component Value Date/Time   CHOL 153 06/18/2019 0914   TRIG 108 06/18/2019 0914   HDL 48 06/18/2019 0914   CHOLHDL 3.2 06/18/2019 0914   LDLCALC 85 06/18/2019 0914     Risk Assessment/Calculations:               Physical Exam:    VS:  BP 128/80 Comment: left arm  Pulse 61   Ht '6\' 3"'$  (1.905 m)   Wt 220 lb 9.6 oz (100.1 kg)   SpO2 96%   BMI 27.57 kg/m     Wt Readings from Last 3 Encounters:  07/22/22 220 lb 9.6 oz (100.1 kg)  08/30/21 201 lb (91.2 kg)  07/20/21 209 lb 12.8 oz (95.2 kg)     GEN:  Well nourished, well developed in no acute distress HEENT: Normal NECK: No JVD; No carotid bruits LYMPHATICS: No lymphadenopathy CARDIAC: RRR, 2/6 systolic/soft diastolic murmur,no rubs, gallops RESPIRATORY:  Clear to auscultation without rales, wheezing or rhonchi  ABDOMEN: Soft, non-tender, non-distended MUSCULOSKELETAL:  No edema; No deformity  SKIN: Warm and dry NEUROLOGIC:  Alert and oriented x 3 PSYCHIATRIC:  Normal affect   ASSESSMENT:    1. Essential hypertension   2. Aortic valve disease    PLAN:    In order of problems listed above:  Aortic regurgitation with bicuspid aortic valve and dilated sinus of Valsalva - We will go ahead and set him up for a transesophageal echocardiogram for TEE.  Risk and benefits explained.  Willing to proceed.  Wife present.  I want to better quantify the severity of his aortic valve regurgitation.  Since having COVID, he has had some difficulties with shortness of breath.  He had to undergo pulmonary rehab.  He is still quite active quite busy.  Does not really have any significant limitations both he and his wife state.  Very rarely will he wake up at night feeling as though he needs to take in a deep breath or to or set up.  He has spoken to Dr. Tamala Julian about this in the past. - I described to him that severe symptomatic aortic regurgitation usually leads to surgical consultation and aortic valve replacement.   Hopefully we can ward this off for as long as possible.  He does not  show any signs of change in his dimensions of left ventricle.  His pump function is normal.  I reviewed his echocardiogram personally from 2020 and compared.  There really is no dramatic change in the 2. -At some point obviously he will require valve replacement.  Left bundle branch block - New onset 07/22/2022.  At some point, we may consider coronary CT scan to evaluate his coronary arteries as well as aortic dimensions.  He is not having any anginal symptoms.  No evidence of cardiomyopathy.  Aortic atherosclerosis - On Crestor 5 mg a day.  Continue.  LDL 65     Shared Decision Making/Informed Consent The risks [esophageal damage, perforation (1:10,000 risk), bleeding, pharyngeal hematoma as well as other potential complications associated with conscious sedation including aspiration, arrhythmia, respiratory failure and death], benefits (treatment guidance and diagnostic support) and alternatives of a transesophageal echocardiogram were discussed in detail with Marvin Munoz and he is willing to proceed.     Medication Adjustments/Labs and Tests Ordered: Current medicines are reviewed at length with the patient today.  Concerns regarding medicines are outlined above.  Orders Placed This Encounter  Procedures   EKG 12-Lead   No orders of the defined types were placed in this encounter.   Patient Instructions  Medication Instructions:  The current medical regimen is effective;  continue present plan and medications.  *If you need a refill on your cardiac medications before your next appointment, please call your pharmacy*   Lab Work: None today If you have labs (blood work) drawn today and your tests are completely normal, you will receive your results only by: [place] (if you have MyChart) OR A paper copy in the mail If you have any lab test that is abnormal or we need to change your treatment, we will call you  to review the results.  Testing/Procedures: Your physician has requested that you have a TEE. During a TEE, sound waves are used to create images of your heart. It provides your doctor with information about the size and shape of your heart and how well your heart's chambers and valves are working. In this test, a transducer is attached to the end of a flexible tube that's guided down your throat and into your esophagus (the tube leading from you mouth to your stomach) to get a more detailed image of your heart. You are not awake for the procedure. Please see the instruction sheet given to you today. For further information please visit [email or web address].  Follow-Up: At [facility], you and your health needs are our priority.  As part of our continuing mission to provide you with exceptional heart care, we have created designated Provider Care Teams.  These Care Teams include your primary Cardiologist (physician) and Advanced Practice Providers (APPs -  Physician Assistants and Nurse Practitioners) who all work together to provide you with the care you need, when you need it.  We recommend signing up for the patient portal called "MyChart".  Sign up information is provided on this After Visit Summary.  MyChart is used to connect with patients for Virtual Visits (Telemedicine).  Patients are able to view lab/test results, encounter notes, upcoming appointments, etc.  Non-urgent messages can be sent to your provider as well.   To learn more about what you can do with MyChart, go to [email or web address].    Your next appointment:   6 month(s)  Provider:   Dr Marvin Munoz       Signed, Marvin Furbish, MD  07/22/2022 5:34 PM    [place]

## 2022-07-22 NOTE — Progress Notes (Signed)
Cardiology Office Note:    Date:  07/22/2022   ID:  Marvin Munoz, DOB 06/19/1961, MRN [id number]  PCP:  Gates, Robert, MD   [facility] Providers Cardiologist:  Arianna Haydon, MD     Referring MD: Gates, Robert, MD    History of Present Illness:    Marvin Munoz is a 61 y.o. male with a hx of bicuspid aortic valve, ascending aortic aneurysm, aortic regurgitation, hyperlipidemia, and primary hypertension presents today for annual follow-up appointment.   He has been walking daily and also enjoys riding a bike. 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    Past Medical History:  Diagnosis Date   Allergic rhinitis    Eczema    Fever blister    occasional   Foot drop, right 09/24/2014   Gastroesophageal reflux    Gout    05-09-2018 per pt last flare up, 2014   Guillain Barr syndrome (HCC) 1970's   per pt received swine flu vaccination and regular flu vaccination at same time, was told one or the other may have caused guillain barre   HTN (hypertension)    Migraine headache    Mild aortic valve stenosis cardiologist-  dr henry smith   per last echo  02-01-2017   moderately thickended and calcificed leaflets with mild AS and mild AR,  valve area 2.43cm^2   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 smith--- per last CTA 02-21-2018 , 39mm    Past Surgical History:  Procedure Laterality Date   BREAST LUMPECTOMY Right 1990s   "benign"   CARDIAC CATHETERIZATION  09/10/2015   dr helen preston  @[facility]    normal coronaries and LVF,  false positive cardiolite   COLONOSCOPY     KNEE ARTHROSCOPY WITH MEDIAL MENISECTOMY Left 05/15/2018   Procedure: LEFT KNEE ARTHROSCOPY WITH MEDIAL MENISECTOMY;  Surgeon: Landau, Joshua, MD;  Location: [facility];  Service: Orthopedics;  Laterality: Left;   REPLACEMENT UNICONDYLAR JOINT KNEE Left 09/10/2015    dr landau  @[facility]   TENDON RECONSTRUCTION Right 06/02/2016   Procedure: right elbow extensor tendon repair and debridement and repair lateral collateral ligament;  Surgeon: Daniel F Murphy, MD;  Location: [facility];  Service: Orthopedics;  Laterality: Right;   TONSILLECTOMY  child   TOTAL KNEE ARTHROPLASTY WITH REVISION COMPONENTS Left 05/15/2018   Procedure: conversion to LEFT total KNEE ARTHROPLASTY with hardware removal;  Surgeon: Landau, Joshua, MD;  Location: [facility];  Service: Orthopedics;  Laterality: Left;  with block   TRANSTHORACIC ECHOCARDIOGRAM  02/01/2017   mild LVH,  ef 60-65%, Grade 2 diastolic dysfucntion/  moderately thickened moderately calcified AV leafleat with mild stenosis and moderate regurg. (valve area 2.43cm^2)/  mild to moderate calcified MV annulus with triv. regurg/ trivial TR/  m;ild RAE    Current Medications: Current Meds  Medication Sig   allopurinol (ZYLOPRIM) 300 MG tablet Take 150 mg by mouth every evening.      aspirin EC 81 MG tablet Take 81 mg by mouth daily.   baclofen (LIORESAL) 10 MG tablet Take 1 tablet (10 mg total) by mouth 3 (three) times daily. As needed for muscle spasm   CALCIUM 600 1500 (600 Ca) MG TABS tablet SMARTSIG:1 By Mouth   Calcium Carb-Cholecalciferol (CALCIUM + D3 PO) Take 1 tablet by mouth daily.    cetirizine (ZYRTEC) 10 MG tablet Take 10 mg by mouth every morning.    diltiazem (CARDIZEM CD) 180 MG 24 hr capsule Take 180 mg by mouth daily.   FIBER PO Take 1 capsule by mouth daily.   fluticasone (FLONASE) 50 MCG/ACT nasal spray Place 1 spray into both nostrils daily.   Multiple Vitamin  (MULTIVITAMIN) tablet Take 1 tablet by mouth daily.   omeprazole (PRILOSEC) 40 MG capsule Take 40 mg by mouth every morning.    rizatriptan (MAXALT-MLT) 10 MG disintegrating tablet Take 10 mg by mouth as needed for migraine. May repeat in 2 hours if needed   rOPINIRole (REQUIP) 1 MG tablet Take 2 mg by mouth daily.   rosuvastatin (CRESTOR) 5 MG tablet TAKE (1) TABLET BY MOUTH ONCE DAILY.   sulfamethoxazole-trimethoprim (BACTRIM DS) 800-160 MG tablet Take 1 tablet by mouth 3 (three) times a week.   valACYclovir (VALTREX) 1000 MG tablet Take 1,000 mg by mouth daily as needed (for cold sores).    zolpidem (AMBIEN) 10 MG tablet Take 10 mg by mouth as needed.     Allergies:   Influenza vaccines, Covid-19 (mrna) vaccine, Adhesive [tape], and Colchicine   Social History   Socioeconomic History   Marital status: Married    Spouse name: Not on file   Number of children: 2   Years of education: college   Highest education level: Not on file  Occupational History   Occupation: Supervisor, operating training  Tobacco Use   Smoking status: Never   Smokeless tobacco: Never  Vaping Use   Vaping Use: Never used  Substance and Sexual Activity   Alcohol use: Not Currently    Alcohol/week: 0.0 standard drinks of alcohol   Drug use: Never   Sexual activity: Yes  Other Topics Concern   Not on file  Social History Narrative   Patient is right handed.   Patient does not drink caffeine.   Social Determinants of Health   Financial Resource Strain: Not on file  Food Insecurity: Not on file  Transportation Needs: Not on file  Physical Activity: Not on file  Stress: Not on file  Social Connections: Not on file     Family History: The patient's family history includes Arthritis/Rheumatoid in his mother; Asthma in his mother; COPD in his mother; Heart attack in his father; Leukemia in his maternal grandfather; Prostate cancer in his father, maternal grandfather, and maternal uncle.  ROS:    Please see the history of present illness.    No syncope bleeding orthopnea PND all other systems reviewed and are negative.  EKGs/Labs/Other Studies Reviewed:    The following studies were reviewed today: CT chest-aortic root 4 to 4.3 cm of the sinus Valsalva, ascending thoracic aorta normal at 3.6-3.8.  ECHO 07/11/22:   1. AoV poorly visualized but reported to be bicuspid. There is eccentric  aortic regurgitation that is moderate to severe. There is holodiastolic  flow reversal in the descending aorta, suggestive of severe aortic  regurgitation. Would recommend TEE or  cardiac MRI to further define severity of regurgitation. The aortic valve  has an indeterminant number of cusps.   Aortic valve regurgitation is  moderate to severe. Aortic valve sclerosis is present, with no evidence of  aortic valve stenosis.   2. Left ventricular ejection fraction, by estimation, is 55 to 60%. The  left ventricle has normal function. The left ventricle has no regional  wall motion abnormalities. Left ventricular diastolic parameters were  normal.   3. Right ventricular systolic function is normal. The right ventricular  size is normal. There is normal pulmonary artery systolic pressure. The  estimated right ventricular systolic pressure is 24.7 mmHg.   4. The mitral valve is grossly normal. Trivial mitral valve  regurgitation. No evidence of mitral stenosis.   5. There is moderate dilatation of the ascending aorta, measuring 42 mm.   6. The inferior vena cava is normal in size with greater than 50%  respiratory variability, suggesting right atrial pressure of 3 mmHg.   Comparison(s): Changes from prior study are noted. Moderate to severe  aortic regurgitation is present.   EKG:  The ekg ordered today demonstrates  07/22/2022-sinus rhythm 61 newly discovered left bundle branch block QRS duration 150 ms.  Recent Labs: 08/11/2021: BUN 16; Creatinine, Ser 1.12; Potassium 4.4; Sodium 141  Recent  Lipid Panel    Component Value Date/Time   CHOL 153 06/18/2019 0914   TRIG 108 06/18/2019 0914   HDL 48 06/18/2019 0914   CHOLHDL 3.2 06/18/2019 0914   LDLCALC 85 06/18/2019 0914     Risk Assessment/Calculations:               Physical Exam:    VS:  BP 128/80 Comment: left arm  Pulse 61   Ht 6' 3" (1.905 m)   Wt 220 lb 9.6 oz (100.1 kg)   SpO2 96%   BMI 27.57 kg/m     Wt Readings from Last 3 Encounters:  07/22/22 220 lb 9.6 oz (100.1 kg)  08/30/21 201 lb (91.2 kg)  07/20/21 209 lb 12.8 oz (95.2 kg)     GEN:  Well nourished, well developed in no acute distress HEENT: Normal NECK: No JVD; No carotid bruits LYMPHATICS: No lymphadenopathy CARDIAC: RRR, 2/6 systolic/soft diastolic murmur,no rubs, gallops RESPIRATORY:  Clear to auscultation without rales, wheezing or rhonchi  ABDOMEN: Soft, non-tender, non-distended MUSCULOSKELETAL:  No edema; No deformity  SKIN: Warm and dry NEUROLOGIC:  Alert and oriented x 3 PSYCHIATRIC:  Normal affect   ASSESSMENT:    1. Essential hypertension   2. Aortic valve disease    PLAN:    In order of problems listed above:  Aortic regurgitation with bicuspid aortic valve and dilated sinus of Valsalva - We will go ahead and set him up for a transesophageal echocardiogram for TEE.  Risk and benefits explained.  Willing to proceed.  Wife present.  I want to better quantify the severity of his aortic valve regurgitation.  Since having COVID, he has had some difficulties with shortness of breath.  He had to undergo pulmonary rehab.  He is still quite active quite busy.  Does not really have any significant limitations both he and his wife state.  Very rarely will he wake up at night feeling as though he needs to take in a deep breath or to or set up.  He has spoken to Dr. Smith about this in the past. - I described to him that severe symptomatic aortic regurgitation usually leads to surgical consultation and aortic valve replacement.   Hopefully we can ward this off for as long as possible.  He does not   show any signs of change in his dimensions of left ventricle.  His pump function is normal.  I reviewed his echocardiogram personally from 2020 and compared.  There really is no dramatic change in the 2. -At some point obviously he will require valve replacement.  Left bundle branch block - New onset 07/22/2022.  At some point, we may consider coronary CT scan to evaluate his coronary arteries as well as aortic dimensions.  He is not having any anginal symptoms.  No evidence of cardiomyopathy.  Aortic atherosclerosis - On Crestor 5 mg a day.  Continue.  LDL 65     Shared Decision Making/Informed Consent The risks [esophageal damage, perforation (1:10,000 risk), bleeding, pharyngeal hematoma as well as other potential complications associated with conscious sedation including aspiration, arrhythmia, respiratory failure and death], benefits (treatment guidance and diagnostic support) and alternatives of a transesophageal echocardiogram were discussed in detail with Mr. Sanden and he is willing to proceed.     Medication Adjustments/Labs and Tests Ordered: Current medicines are reviewed at length with the patient today.  Concerns regarding medicines are outlined above.  Orders Placed This Encounter  Procedures   EKG 12-Lead   No orders of the defined types were placed in this encounter.   Patient Instructions  Medication Instructions:  The current medical regimen is effective;  continue present plan and medications.  *If you need a refill on your cardiac medications before your next appointment, please call your pharmacy*   Lab Work: None today If you have labs (blood work) drawn today and your tests are completely normal, you will receive your results only by: MyChart Message (if you have MyChart) OR A paper copy in the mail If you have any lab test that is abnormal or we need to change your treatment, we will call you  to review the results.  Testing/Procedures: Your physician has requested that you have a TEE. During a TEE, sound waves are used to create images of your heart. It provides your doctor with information about the size and shape of your heart and how well your heart's chambers and valves are working. In this test, a transducer is attached to the end of a flexible tube that's guided down your throat and into your esophagus (the tube leading from you mouth to your stomach) to get a more detailed image of your heart. You are not awake for the procedure. Please see the instruction sheet given to you today. For further information please visit www.[email or web address].  Follow-Up: At [facility], you and your health needs are our priority.  As part of our continuing mission to provide you with exceptional heart care, we have created designated Provider Care Teams.  These Care Teams include your primary Cardiologist (physician) and Advanced Practice Providers (APPs -  Physician Assistants and Nurse Practitioners) who all work together to provide you with the care you need, when you need it.  We recommend signing up for the patient portal called "MyChart".  Sign up information is provided on this After Visit Summary.  MyChart is used to connect with patients for Virtual Visits (Telemedicine).  Patients are able to view lab/test results, encounter notes, upcoming appointments, etc.  Non-urgent messages can be sent to your provider as well.   To learn more about what you can do with MyChart, go to [email or web address].    Your next appointment:   6 month(s)  Provider:   Dr Sherril Shipman       Signed, Landa Mullinax, MD    07/22/2022 5:34 PM    [facility] 

## 2022-08-17 ENCOUNTER — Ambulatory Visit (HOSPITAL_COMMUNITY): Payer: 59 | Admitting: Anesthesiology

## 2022-08-17 ENCOUNTER — Ambulatory Visit (HOSPITAL_COMMUNITY)
Admission: RE | Admit: 2022-08-17 | Discharge: 2022-08-17 | Disposition: A | Discharge status: Home / Self Care | Payer: 59 | Attending: Cardiology | Admitting: Cardiology

## 2022-08-17 ENCOUNTER — Ambulatory Visit (HOSPITAL_BASED_OUTPATIENT_CLINIC_OR_DEPARTMENT_OTHER): Payer: 59 | Admitting: Anesthesiology

## 2022-08-17 ENCOUNTER — Encounter (HOSPITAL_COMMUNITY)
Admission: RE | Disposition: A | Discharge status: Home / Self Care | Payer: Self-pay | Source: Home / Self Care | Attending: Cardiology

## 2022-08-17 ENCOUNTER — Ambulatory Visit (HOSPITAL_BASED_OUTPATIENT_CLINIC_OR_DEPARTMENT_OTHER): Payer: 59

## 2022-08-17 ENCOUNTER — Encounter (HOSPITAL_COMMUNITY): Payer: Self-pay | Admitting: Cardiology

## 2022-08-17 ENCOUNTER — Other Ambulatory Visit: Payer: Self-pay

## 2022-08-17 DIAGNOSIS — I351 Nonrheumatic aortic (valve) insufficiency: Secondary | ICD-10-CM | POA: Diagnosis not present

## 2022-08-17 DIAGNOSIS — I10 Essential (primary) hypertension: Secondary | ICD-10-CM | POA: Insufficient documentation

## 2022-08-17 DIAGNOSIS — I340 Nonrheumatic mitral (valve) insufficiency: Secondary | ICD-10-CM

## 2022-08-17 DIAGNOSIS — I080 Rheumatic disorders of both mitral and aortic valves: Secondary | ICD-10-CM | POA: Diagnosis not present

## 2022-08-17 DIAGNOSIS — M1990 Unspecified osteoarthritis, unspecified site: Secondary | ICD-10-CM | POA: Diagnosis not present

## 2022-08-17 DIAGNOSIS — I341 Nonrheumatic mitral (valve) prolapse: Secondary | ICD-10-CM

## 2022-08-17 DIAGNOSIS — E785 Hyperlipidemia, unspecified: Secondary | ICD-10-CM | POA: Insufficient documentation

## 2022-08-17 DIAGNOSIS — Q231 Congenital insufficiency of aortic valve: Secondary | ICD-10-CM | POA: Diagnosis present

## 2022-08-17 DIAGNOSIS — Z79899 Other long term (current) drug therapy: Secondary | ICD-10-CM | POA: Diagnosis not present

## 2022-08-17 DIAGNOSIS — I359 Nonrheumatic aortic valve disorder, unspecified: Secondary | ICD-10-CM

## 2022-08-17 DIAGNOSIS — I447 Left bundle-branch block, unspecified: Secondary | ICD-10-CM | POA: Insufficient documentation

## 2022-08-17 DIAGNOSIS — I714 Abdominal aortic aneurysm, without rupture, unspecified: Secondary | ICD-10-CM | POA: Insufficient documentation

## 2022-08-17 HISTORY — PX: TEE WITHOUT CARDIOVERSION: SHX5443

## 2022-08-17 LAB — ECHO TEE
AV Mean grad: 9 mmHg
AV Peak grad: 15.8 mmHg
Ao pk vel: 1.99 m/s
P 1/2 time: 549 msec

## 2022-08-17 SURGERY — ECHOCARDIOGRAM, TRANSESOPHAGEAL
Anesthesia: Monitor Anesthesia Care

## 2022-08-17 MED ORDER — PROPOFOL 500 MG/50ML IV EMUL
INTRAVENOUS | Status: DC | PRN
  Administered 2022-08-17: 200 ug/kg/min via INTRAVENOUS

## 2022-08-17 MED ORDER — LIDOCAINE 2% (20 MG/ML) 5 ML SYRINGE
INTRAMUSCULAR | Status: DC | PRN
  Administered 2022-08-17: 40 mg via INTRAVENOUS

## 2022-08-17 MED ORDER — SODIUM CHLORIDE 0.9 % IV SOLN: INTRAVENOUS | Status: DC

## 2022-08-17 NOTE — Interval H&P Note (Signed)
History and Physical Interval Note:  08/17/2022 8:10 AM  Marvin Munoz  has presented today for surgery, with the diagnosis of AORTIC VALVE REGURGITATION.  The various methods of treatment have been discussed with the patient and family. After consideration of risks, benefits and other options for treatment, the patient has consented to  Procedure(s): TRANSESOPHAGEAL ECHOCARDIOGRAM (TEE) (N/A) as a surgical intervention.  The patient's history has been reviewed, patient examined, no change in status, stable for surgery.  I have reviewed the patient's chart and labs.  Questions were answered to the patient's satisfaction.     [employer]

## 2022-08-17 NOTE — Transfer of Care (Signed)
Immediate Anesthesia Transfer of Care Note  Patient: Marvin Munoz  Procedure(s) Performed: TRANSESOPHAGEAL ECHOCARDIOGRAM (TEE)  Patient Location: PACU  Anesthesia Type:MAC  Level of Consciousness: drowsy and patient cooperative  Airway & Oxygen Therapy: Patient Spontanous Breathing  Post-op Assessment: Report given to RN and Post -op Vital signs reviewed and stable  Post vital signs: Reviewed and stable  Last Vitals:  Vitals Value Taken Time  BP    Temp    Pulse    Resp    SpO2      Last Pain:  Vitals:   08/17/22 0759  TempSrc: Tympanic  PainSc: 0-No pain         Complications: No notable events documented.

## 2022-08-17 NOTE — Anesthesia Postprocedure Evaluation (Signed)
Anesthesia Post Note  Patient: Marvin Munoz  Procedure(s) Performed: TRANSESOPHAGEAL ECHOCARDIOGRAM (TEE)     Patient location during evaluation: PACU Anesthesia Type: MAC Level of consciousness: awake and alert Pain management: pain level controlled Vital Signs Assessment: post-procedure vital signs reviewed and stable Respiratory status: spontaneous breathing, nonlabored ventilation, respiratory function stable and patient connected to nasal cannula oxygen Cardiovascular status: stable and blood pressure returned to baseline Postop Assessment: no apparent nausea or vomiting Anesthetic complications: no  No notable events documented.  Last Vitals:  Vitals:   08/17/22 0930 08/17/22 0940  BP: 93/74 113/76  Pulse: (!) 57 (!) 55  Resp: 16 16  Temp:    SpO2: 95% 99%    Last Pain:  Vitals:   08/17/22 0940  TempSrc:   PainSc: 0-No pain                 Effie Berkshire

## 2022-08-17 NOTE — Discharge Instructions (Signed)

## 2022-08-17 NOTE — CV Procedure (Signed)
   Transesophageal Echocardiogram  Indications: Aortic regurgitation  Time out performed  During this procedure the patient was administered propofol under anesthesiology supervision to achieve and maintain moderate sedation.  The patient's heart rate, blood pressure, and oxygen saturation are monitored continuously during the procedure.   Findings:  Left Ventricle: Normal left ventricular ejection fraction 60%  Mitral Valve: Billowing/prolapsing A2 leaflet, mild regurgitation  Aortic Valve: Bicuspid aortic valve, mild calcification.  Moderate aortic regurgitation with eccentric jet pushing against the A2 billowing leaflet of the mitral valve.  3D imaging involved.  There was no obvious evidence of flow reversal in aortic arch.  Tricuspid Valve: Mild tricuspid regurgitation, normal estimated RV pressure  Left Atrium: Normal, no left atrial appendage thrombus  Right Atrium: Normal  Intraatrial septum: Normal  Candee Furbish, MD

## 2022-08-17 NOTE — Anesthesia Preprocedure Evaluation (Addendum)
Anesthesia Evaluation  Patient identified by MRN, date of birth, ID band Patient awake    Reviewed: Allergy & Precautions, NPO status , Patient's Chart, lab work & pertinent test results  Airway Mallampati: II  TM Distance: >3 FB Neck ROM: Full    Dental  (+) Teeth Intact, Dental Advisory Given   Pulmonary neg pulmonary ROS   breath sounds clear to auscultation       Cardiovascular hypertension, + Valvular Problems/Murmurs AI and AS  Rhythm:Regular Rate:Normal     Neuro/Psych  Headaches  negative psych ROS   GI/Hepatic Neg liver ROS,GERD  ,,  Endo/Other  negative endocrine ROS    Renal/GU negative Renal ROS     Musculoskeletal  (+) Arthritis ,    Abdominal   Peds  Hematology negative hematology ROS (+)   Anesthesia Other Findings   Reproductive/Obstetrics                             Anesthesia Physical Anesthesia Plan  ASA: 3  Anesthesia Plan: MAC   Post-op Pain Management: Minimal or no pain anticipated   Induction: Intravenous  PONV Risk Score and Plan: 0 and Propofol infusion  Airway Management Planned: Natural Airway and Simple Face Mask  Additional Equipment: None  Intra-op Plan:   Post-operative Plan:   Informed Consent: I have reviewed the patients History and Physical, chart, labs and discussed the procedure including the risks, benefits and alternatives for the proposed anesthesia with the patient or authorized representative who has indicated his/her understanding and acceptance.       Plan Discussed with: CRNA  Anesthesia Plan Comments:        Anesthesia Quick Evaluation

## 2022-08-17 NOTE — Progress Notes (Signed)
  Echocardiogram Echocardiogram Transesophageal has been performed.  Marvin Munoz 08/17/2022, 9:25 AM

## 2022-08-18 ENCOUNTER — Encounter (HOSPITAL_COMMUNITY): Payer: Self-pay | Admitting: Cardiology

## 2022-08-23 ENCOUNTER — Other Ambulatory Visit: Payer: Self-pay | Admitting: Internal Medicine

## 2022-08-23 DIAGNOSIS — R1030 Lower abdominal pain, unspecified: Secondary | ICD-10-CM

## 2022-08-30 ENCOUNTER — Ambulatory Visit
Admission: RE | Admit: 2022-08-30 | Discharge: 2022-08-30 | Disposition: A | Discharge status: Home / Self Care | Payer: 59 | Source: Ambulatory Visit | Attending: Internal Medicine | Admitting: Internal Medicine

## 2022-08-30 DIAGNOSIS — R1030 Lower abdominal pain, unspecified: Secondary | ICD-10-CM

## 2023-02-03 ENCOUNTER — Encounter: Payer: Self-pay | Admitting: Cardiology

## 2023-02-03 ENCOUNTER — Ambulatory Visit: Payer: 59 | Attending: Cardiology | Admitting: Cardiology

## 2023-02-03 VITALS — BP 122/74 | HR 67 | Ht 75.0 in | Wt 186.2 lb

## 2023-02-03 DIAGNOSIS — E782 Mixed hyperlipidemia: Secondary | ICD-10-CM

## 2023-02-03 DIAGNOSIS — Q231 Congenital insufficiency of aortic valve: Secondary | ICD-10-CM

## 2023-02-03 DIAGNOSIS — I359 Nonrheumatic aortic valve disorder, unspecified: Secondary | ICD-10-CM

## 2023-02-03 DIAGNOSIS — I10 Essential (primary) hypertension: Secondary | ICD-10-CM | POA: Diagnosis not present

## 2023-02-03 MED ORDER — ROSUVASTATIN CALCIUM 5 MG PO TABS: 5.0000 mg | ORAL_TABLET | Freq: Every day | ORAL | 3 refills | Status: AC

## 2023-02-03 NOTE — Patient Instructions (Signed)
Medication Instructions:  The current medical regimen is effective;  continue present plan and medications.  *If you need a refill on your cardiac medications before your next appointment, please call your pharmacy*   Testing/Procedures: Your physician has requested that you have an echocardiogram in 1 year. Echocardiography is a painless test that uses sound waves to create images of your heart. It provides your doctor with information about the size and shape of your heart and how well your heart's chambers and valves are working. This procedure takes approximately one hour. There are no restrictions for this procedure. Please do NOT wear cologne, perfume, aftershave, or lotions (deodorant is allowed). Please arrive 15 minutes prior to your appointment time.   Follow-Up: At [facility], you and your health needs are our priority.  As part of our continuing mission to provide you with exceptional heart care, we have created designated Provider Care Teams.  These Care Teams include your primary Cardiologist (physician) and Advanced Practice Providers (APPs -  Physician Assistants and Nurse Practitioners) who all work together to provide you with the care you need, when you need it.  We recommend signing up for the patient portal called "MyChart".  Sign up information is provided on this After Visit Summary.  MyChart is used to connect with patients for Virtual Visits (Telemedicine).  Patients are able to view lab/test results, encounter notes, upcoming appointments, etc.  Non-urgent messages can be sent to your provider as well.   To learn more about what you can do with MyChart, go to [email or web address].    Your next appointment:   1 year(s)  Provider:   Donato Schultz, MD

## 2023-02-03 NOTE — Progress Notes (Signed)
  Cardiology Office Note:  .   Date:  02/03/2023  ID:  Marvin Munoz, DOB [birth year], MRN [id number] PCP: Marden Noble, MD (Inactive)  [facility] Providers Cardiologist:  Donato Schultz, MD     History of Present Illness: .   Discussed the use of AI scribe software for clinical note transcription with the patient, who gave verbal consent to proceed.  History of Present Illness   A 62 year old patient with a history of bicuspid aortic valve, ascending aortic aneurysm, aortic regurgitation, and hypertension presents for a follow-up visit. The patient has been maintaining an active lifestyle with walking, bike riding, and yard work. The patient has had some shortness of breath in the past and underwent pulmonary rehabilitation. The patient also has a left bundle branch block. The patient is on Crestor, 5 mg once a day with LDL of 65. The patient has been feeling well and has not experienced any chest pain, shortness of breath, fainting, or bleeding. The patient has lost 26 pounds since the last visit due to lifestyle changes, including increased physical activity and dietary modifications.        Studies Reviewed: Marland Kitchen        LABS LDL: 65  RADIOLOGY CT scan: Aortic atherosclerosis  DIAGNOSTIC Transesophageal echocardiogram: Bicuspid valve with moderate regurgitation, ejection fraction of 65%, no left atrial appendage thrombus, myxomatous mitral valve with mild regurgitation and moderate prolapse of the mid segment, aortic valve mean gradient 9 mmHg, aortic root 40 mm (08/17/2022)  Risk Assessment/Calculations:            Physical Exam:   VS:  BP 122/74   Pulse 67   Ht 6\' 3"  (1.905 m)   Wt 186 lb 3.2 oz (84.5 kg)   SpO2 96%   BMI 23.27 kg/m    Wt Readings from Last 3 Encounters:  02/03/23 186 lb 3.2 oz (84.5 kg)  08/17/22 212 lb (96.2 kg)  07/22/22 220 lb 9.6 oz (100.1 kg)    GEN: Well nourished, well developed in no acute distress NECK: No JVD; No carotid bruits CARDIAC:  RRR, 1/6 SMno rubs, gallops RESPIRATORY:  Clear to auscultation without rales, wheezing or rhonchi  ABDOMEN: Soft, non-tender, non-distended EXTREMITIES:  No edema; No deformity   ASSESSMENT AND PLAN: .    Assessment and Plan    Bicuspid Aortic Valve with Moderate Regurgitation Stable with no high-risk symptoms. Echocardiogram on 08/17/22 showed moderate regurgitation, mild stenosis, and ejection fraction of 65%. -Continue current management and monitoring. -Plan for repeat standard echocardiogram prior to next visit in 1 year.  Ascending Aortic dilitation Stable with aortic root mildly dilated at 40mm on last echocardiogram. -Continue current management and monitoring. -Plan for repeat standard echocardiogram prior to next visit in 1 year.  Mitral Valve Prolapse with Mild Regurgitation Stable with no high-risk symptoms. Echocardiogram on 08/17/22 showed mild regurgitation and moderate prolapse. -Continue current management and monitoring. -Plan for repeat standard echocardiogram prior to next visit in 1 year.  Hypertension Stable with no high-risk symptoms. -Continue current management and monitoring.  Hyperlipidemia Well controlled on Crestor 5mg  daily with LDL of 65. -Continue Crestor 5mg  daily.  General Health Maintenance / Followup Plans -Continue lifestyle modifications including increased physical activity and dietary changes. -Plan for follow-up visit in 1 year with repeat standard echocardiogram prior to visit.            Dispo: 1 yr  Signed, Donato Schultz, MD

## 2024-01-03 ENCOUNTER — Ambulatory Visit (HOSPITAL_COMMUNITY)
Admission: RE | Admit: 2024-01-03 | Discharge: 2024-01-03 | Disposition: A | Discharge status: Home / Self Care | Payer: 59 | Source: Ambulatory Visit | Attending: Cardiovascular Disease | Admitting: Cardiovascular Disease

## 2024-01-03 DIAGNOSIS — I359 Nonrheumatic aortic valve disorder, unspecified: Secondary | ICD-10-CM

## 2024-01-03 DIAGNOSIS — Q2381 Bicuspid aortic valve: Secondary | ICD-10-CM

## 2024-01-03 LAB — ECHOCARDIOGRAM COMPLETE
AR max vel: 2.15 cm2
AV Area VTI: 2.06 cm2
AV Area mean vel: 1.96 cm2
AV Mean grad: 7.2 mmHg
AV Peak grad: 12.5 mmHg
Ao pk vel: 1.77 m/s
Area-P 1/2: 2.87 cm2
P 1/2 time: 771 ms
S' Lateral: 3 cm

## 2024-01-04 ENCOUNTER — Ambulatory Visit: Payer: Self-pay | Admitting: Cardiology

## 2024-02-12 ENCOUNTER — Encounter: Payer: Self-pay | Admitting: Cardiology

## 2024-02-12 ENCOUNTER — Ambulatory Visit: Attending: Cardiology | Admitting: Cardiology

## 2024-02-12 VITALS — BP 130/78 | HR 59 | Ht 75.0 in | Wt 199.8 lb

## 2024-02-12 DIAGNOSIS — Q2381 Bicuspid aortic valve: Secondary | ICD-10-CM | POA: Diagnosis not present

## 2024-02-12 DIAGNOSIS — I10 Essential (primary) hypertension: Secondary | ICD-10-CM

## 2024-02-12 DIAGNOSIS — I359 Nonrheumatic aortic valve disorder, unspecified: Secondary | ICD-10-CM | POA: Diagnosis not present

## 2024-02-12 NOTE — Progress Notes (Signed)
 Cardiology Office Note:  .   Date:  02/12/2024  ID:  Marvin Munoz, DOB 1961/01/09, MRN [id number] PCP: Delice Charleston, MD (Inactive)  [facility] Providers Cardiologist:  Oneil Parchment, MD     History of Present Illness: .   Marvin Munoz is a 63 y.o. male Discussed the use of AI scribe software for clinical note transcription with the patient, who gave verbal consent to proceed.  History of Present Illness Marvin Munoz is a 63 year old male with a bicuspid aortic valve and aortic regurgitation who presents for follow-up evaluation.  He has a history of a bicuspid aortic valve with moderate to severe aortic regurgitation. A transesophageal echocardiogram in 2024 confirmed the presence of a bicuspid aortic valve with moderate regurgitation and no significant aortic stenosis. The most recent echocardiogram on January 07, 2024, showed an ejection fraction of 60%, mild prolapse of the anterior mitral valve leaflet, mild mitral valve regurgitation, and an eccentric aortic regurgitation jet. The aortic valve is moderately calcified and thickened, with the ascending aorta measuring 40 millimeters. No new symptoms such as chest pain, syncope, or increased shortness of breath.  He has a history of hypertension and hyperlipidemia. He is currently taking Crestor  5 mg daily, which has maintained his LDL at 65 in the past. He also takes diltiazem  (Cardizem  CD) 180 mg once daily and aspirin  81 mg daily. He uses meloxicam sparingly for occasional arm pain and has reduced his omeprazole intake to three times a week due to concerns about long-term effects.  He is very active, playing golf three times a week and engaging in regular physical activities such as walking. No issues with his current level of activity.  No chest pain, fevers, chills, nausea, vomiting, syncope, or bleeding. His recent PKG was normal, and his hemoglobin A1c was 5.7, indicating borderline prediabetes. His creatinine level was 1.1,  indicating normal kidney function.     Studies Reviewed: [facility]ABRA   EKG Interpretation Date/Time:  Monday February 12 2024 08:43:26 EDT Ventricular Rate:  59 PR Interval:  174 QRS Duration:  78 QT Interval:  418 QTC Calculation: 413 R Axis:   24  Text Interpretation: Sinus bradycardia Poor R wave progression No previous ECGs available Confirmed by Parchment Oneil ([id number]) on 02/12/2024 8:46:38 AM    Results LABS Hemoglobin A1c: 5.7 Creatinine: 1.1  DIAGNOSTIC Echocardiogram: Ejection fraction 60%, normal right ventricle, mild prolapse of anterior mitral valve leaflet, mild mitral valve regurgitation, eccentric aortic regurgitation jet, early diastolic flow reversal in descending aorta, bicuspid aortic valve, moderate calcification and thickening of aortic valve, moderate to severe regurgitation, ascending aorta 40 mm, mildly dilated (01/07/2024) Transesophageal echocardiogram: Bicuspid aortic valve with moderate regurgitation, no significant aortic stenosis (2024) Risk Assessment/Calculations:            Physical Exam:   VS:  BP 130/78 (BP Location: Left Arm, Patient Position: Sitting)   Pulse (!) 59   Ht 6' 3 (1.905 m)   Wt 199 lb 12.8 oz (90.6 kg)   SpO2 98%   BMI 24.97 kg/m    Wt Readings from Last 3 Encounters:  02/12/24 199 lb 12.8 oz (90.6 kg)  02/03/23 186 lb 3.2 oz (84.5 kg)  08/17/22 212 lb (96.2 kg)    GEN: Well nourished, well developed in no acute distress NECK: No JVD; No carotid bruits CARDIAC: RRR, soft diastolic murmur, no rubs, no gallops RESPIRATORY:  Clear to auscultation without rales, wheezing or rhonchi  ABDOMEN: Soft, non-tender, non-distended EXTREMITIES:  No edema; No deformity   ASSESSMENT AND PLAN: .    Assessment and Plan Assessment & Plan Bicuspid aortic valve with moderate aortic regurgitation and ascending aortic dilatation Bicuspid aortic valve with moderate regurgitation confirmed by echocardiogram. Moderate calcification and thickening of the  aortic valve. Eccentric aortic regurgitation jet with early diastolic flow reversal in the descending aorta. Ascending aorta is mildly dilated at 40 mm. No significant aortic stenosis. No new symptoms reported. Active lifestyle maintained. Discussed potential future need for valve replacement with bioprosthetic valve if symptoms or echocardiogram changes occur. Risks and benefits of early versus delayed intervention discussed. The rationale for waiting includes the intersection of risk and benefit, primarily driven by symptoms or changes in heart function. Bioprosthetic valve replacement anticipated to last a decade or more, with potential for catheter-based replacement in the future. - Repeat echocardiogram in one year - Awaiting timing for valve replacement if symptoms or echocardiogram changes occur  Mitral valve prolapse with mild regurgitation Mild prolapse of the anterior mitral valve leaflet with mild regurgitation. No significant changes noted. No new symptoms reported.  Hypertension Hypertension managed with current medication regimen. No new symptoms reported. - Continue current antihypertensive medication regimen  Hyperlipidemia Hyperlipidemia managed with Crestor  5 mg daily. LDL previously recorded at 65. No new symptoms reported. - Continue Crestor  5 mg daily         1 yr with ECHO  Signed, Oneil Parchment, MD

## 2024-02-12 NOTE — Patient Instructions (Signed)
 Medication Instructions:  The current medical regimen is effective;  continue present plan and medications.  *If you need a refill on your cardiac medications before your next appointment, please call your pharmacy*  Testing/Procedures: Your physician has requested that you have an echocardiogram in 1 year. Echocardiography is a painless test that uses sound waves to create images of your heart. It provides your doctor with information about the size and shape of your heart and how well your heart's chambers and valves are working. This procedure takes approximately one hour. There are no restrictions for this procedure. Please do NOT wear cologne, perfume, aftershave, or lotions (deodorant is allowed). Please arrive 15 minutes prior to your appointment time.  Please note: We ask at that you not bring children with you during ultrasound (echo/ vascular) testing. Due to room size and safety concerns, children are not allowed in the ultrasound rooms during exams. Our front office staff cannot provide observation of children in our lobby area while testing is being conducted. An adult accompanying a patient to their appointment will only be allowed in the ultrasound room at the discretion of the ultrasound technician under special circumstances. We apologize for any inconvenience.   Follow-Up: At [facility], you and your health needs are our priority.  As part of our continuing mission to provide you with exceptional heart care, our providers are all part of one team.  This team includes your primary Cardiologist (physician) and Advanced Practice Providers or APPs (Physician Assistants and Nurse Practitioners) who all work together to provide you with the care you need, when you need it.  Your next appointment:   1 year(s)  Provider:   Oneil Parchment, MD    We recommend signing up for the patient portal called MyChart.  Sign up information is provided on this After Visit Summary.   MyChart is used to connect with patients for Virtual Visits (Telemedicine).  Patients are able to view lab/test results, encounter notes, upcoming appointments, etc.  Non-urgent messages can be sent to your provider as well.   To learn more about what you can do with MyChart, go to [email or web address].
